# Patient Record
Sex: Male | Born: 1950 | Race: White | Hispanic: No | Marital: Married | Smoking: Former smoker
Health system: Southern US, Community
[De-identification: ages and names within clinical notes are randomized; demographics above are authoritative.]

## PROBLEM LIST (undated history)

## (undated) ENCOUNTER — Emergency Department (HOSPITAL_COMMUNITY): Admission: EM | Payer: No Typology Code available for payment source

## (undated) DIAGNOSIS — I2510 Atherosclerotic heart disease of native coronary artery without angina pectoris: Secondary | ICD-10-CM

## (undated) DIAGNOSIS — N200 Calculus of kidney: Secondary | ICD-10-CM

## (undated) DIAGNOSIS — Z930 Tracheostomy status: Secondary | ICD-10-CM

## (undated) DIAGNOSIS — E039 Hypothyroidism, unspecified: Secondary | ICD-10-CM

## (undated) DIAGNOSIS — I10 Essential (primary) hypertension: Secondary | ICD-10-CM

## (undated) DIAGNOSIS — E119 Type 2 diabetes mellitus without complications: Secondary | ICD-10-CM

## (undated) DIAGNOSIS — R339 Retention of urine, unspecified: Secondary | ICD-10-CM

## (undated) DIAGNOSIS — S069XAA Unspecified intracranial injury with loss of consciousness status unknown, initial encounter: Secondary | ICD-10-CM

## (undated) HISTORY — PX: CRANIECTOMY: SHX331

## (undated) HISTORY — PX: JEJUNOSTOMY FEEDING TUBE: SUR737

## (undated) HISTORY — PX: CHOLECYSTECTOMY: SHX55

## (undated) HISTORY — PX: OTHER SURGICAL HISTORY: SHX169

---

## 2007-09-21 ENCOUNTER — Ambulatory Visit: Payer: Self-pay

## 2007-10-04 ENCOUNTER — Ambulatory Visit: Payer: Self-pay | Admitting: Orthopaedic Surgery

## 2011-08-13 ENCOUNTER — Emergency Department: Payer: Self-pay | Admitting: Emergency Medicine

## 2018-06-15 ENCOUNTER — Other Ambulatory Visit: Payer: Self-pay

## 2018-06-15 ENCOUNTER — Encounter: Payer: Self-pay | Admitting: Emergency Medicine

## 2018-06-15 ENCOUNTER — Emergency Department
Admission: EM | Admit: 2018-06-15 | Discharge: 2018-06-15 | Disposition: A | Discharge status: Home / Self Care | Payer: Non-veteran care | Attending: Emergency Medicine | Admitting: Emergency Medicine

## 2018-06-15 DIAGNOSIS — H11421 Conjunctival edema, right eye: Secondary | ICD-10-CM | POA: Diagnosis not present

## 2018-06-15 DIAGNOSIS — E119 Type 2 diabetes mellitus without complications: Secondary | ICD-10-CM | POA: Diagnosis not present

## 2018-06-15 DIAGNOSIS — H5711 Ocular pain, right eye: Secondary | ICD-10-CM | POA: Diagnosis present

## 2018-06-15 HISTORY — DX: Type 2 diabetes mellitus without complications: E11.9

## 2018-06-15 HISTORY — DX: Calculus of kidney: N20.0

## 2018-06-15 MED ORDER — KETOROLAC TROMETHAMINE 0.4 % OP SOLN: 1.0000 [drp] | Freq: Four times a day (QID) | OPHTHALMIC | 0 refills | Status: DC | PRN

## 2018-06-15 MED ORDER — TETRACAINE HCL 0.5 % OP SOLN
1.0000 [drp] | Freq: Once | OPHTHALMIC | Status: AC
  Administered 2018-06-15: 1 [drp] via OPHTHALMIC

## 2018-06-15 MED ORDER — FLUORESCEIN SODIUM 1 MG OP STRP
ORAL_STRIP | OPHTHALMIC | Status: AC
  Filled 2018-06-15: qty 1

## 2018-06-15 MED ORDER — TETRACAINE HCL 0.5 % OP SOLN
OPHTHALMIC | Status: AC
  Filled 2018-06-15: qty 4

## 2018-06-15 MED ORDER — FLUORESCEIN SODIUM 1 MG OP STRP
1.0000 | ORAL_STRIP | Freq: Once | OPHTHALMIC | Status: AC
  Administered 2018-06-15: 1 via OPHTHALMIC

## 2018-06-15 NOTE — ED Notes (Signed)
MD at bedside. 

## 2018-06-15 NOTE — Discharge Instructions (Signed)
Please make sure your eyes remain well lubricated and follow-up with your ophthalmologist as scheduled.  Return to the emergency department for any concerns.  It was a pleasure to take care of you today, and thank you for coming to our emergency department.  If you have any questions or concerns before leaving please ask the nurse to grab me and I'm more than happy to go through your aftercare instructions again.  If you were prescribed any opioid pain medication today such as Norco, Vicodin, Percocet, morphine, hydrocodone, or oxycodone please make sure you do not drive when you are taking this medication as it can alter your ability to drive safely.  If you have any concerns once you are home that you are not improving or are in fact getting worse before you can make it to your follow-up appointment, please do not hesitate to call 911 and come back for further evaluation.  Merrily BrittleNeil Afton Mikelson, MD

## 2018-06-15 NOTE — ED Triage Notes (Addendum)
Patient ambulatory to triage with steady gait, without difficulty or distress noted; pt reports approx 1pm had proced at [facility] for [facility] to eye; hr PTA began having bleeding to eye and seems to have more pain than normally does; visual acuity 20/25 left and 20/50 right

## 2018-06-15 NOTE — ED Provider Notes (Signed)
[facility] Emergency Department Provider Note  ____________________________________________   First MD Initiated Contact with Patient 06/15/18 2113     (approximate)  I have reviewed the triage vital signs and the nursing notes.   HISTORY  Chief Complaint Eye Pain   HPI Joshua Haley is a 67 y.o. male self presents to the emergency department with gradual onset slowly progressive mild to moderate right eye pain and "bleeding from my right eye" that began about an hour ago.  He has a long-standing history of macular degeneration and earlier today received an unknown injection intraocularly at the [facility].  This is his fifth such injection.  He has never had pain or bleeding following.  His symptoms came on gradually were slowly progressive are now mild to moderate.  Nothing seems to make them better or worse.  He does take aspirin but no other blood thinning medications.    Past Medical History:  Diagnosis Date  . Diabetes mellitus without complication (HCC)   . Kidney stone     There are no active problems to display for this patient.     Prior to Admission medications   Medication Sig Start Date End Date Taking? Authorizing Provider  ketorolac (ACULAR) 0.4 % SOLN Place 1 drop into the right eye 4 (four) times daily as needed (pain). 06/15/18   Merrily Brittleifenbark, Nyima Vanacker, MD    Allergies Morphine and related  No family history on file.  Social History Social History   Tobacco Use  . Smoking status: Never Smoker  . Smokeless tobacco: Never Used  Substance Use Topics  . Alcohol use: Not on file  . Drug use: Not on file    Review of Systems Constitutional: No fever/chills Eyes: Positive for visual changes Cardiovascular: Denies chest pain. Respiratory: Denies shortness of breath. Gastrointestinal: No abdominal pain.  No nausea, no vomiting.   Musculoskeletal: Negative for back pain. Skin: Negative for rash. Neurological:  Negative for headaches, focal weakness or numbness.   ____________________________________________   PHYSICAL EXAM:  VITAL SIGNS: ED Triage Vitals  Enc Vitals Group     BP 06/15/18 2104 (!) 165/84     Pulse Rate 06/15/18 2104 67     Resp 06/15/18 2104 20     Temp 06/15/18 2104 97.9 F (36.6 C)     Temp Source 06/15/18 2104 Oral     SpO2 06/15/18 2104 97 %     Weight 06/15/18 2105 220 lb (99.8 kg)     Height 06/15/18 2105 5\' 11"  (1.803 m)     Head Circumference --      Peak Flow --      Pain Score 06/15/18 2104 5     Pain Loc --      Pain Edu? --      Excl. in GC? --     Constitutional: Alert and oriented x4 pleasant cooperative speaks full clear sentences no diaphoresis Eyes: PERRL EOMI. midrange and brisk 20/20 bilaterally Intraocular pressures 15 on the right 12 on the left Right eye with bloody chemosis No fluorescein uptake Head: Atraumatic. Mouth/Throat: No trismus Neck: No stridor.   Cardiovascular: Normal rate, regular rhythm. Grossly normal heart sounds.  Good peripheral circulation. Respiratory: Normal respiratory effort.  No retractions. Lungs CTAB and moving good air Neurologic:  Normal speech and language. No gross focal neurologic deficits are appreciated. Skin:  Skin is warm, dry and intact. No rash noted. Psychiatric: Mood and affect are normal. Speech and behavior are normal.  ____________________________________________   DIFFERENTIAL includes but not limited to  Glaucoma, low intraocular pressure, corneal abrasion, corneal ulcer ____________________________________________   LABS (all labs ordered are listed, but only abnormal results are displayed)  Labs Reviewed - No data to display   __________________________________________  EKG   ____________________________________________  RADIOLOGY   ____________________________________________   PROCEDURES  Procedure(s) performed: no  Procedures  Critical Care performed:  no  Observation: no ____________________________________________   INITIAL IMPRESSION / ASSESSMENT AND PLAN / ED COURSE  Pertinent labs & imaging results that were available during my care of the patient were reviewed by me and considered in my medical decision making (see chart for details).  Spoke with on-call ophthalmologist Dr. Sharman Crate who indicated the pain is likely either secondary to corneal abrasion or from the Betadine in the eye from prior to surgery.  The patient's intraocular pressure is normal his visual acuity is normal and he has no forcing uptake.  His symptoms are nearly completely resolved following tetracaine.  I will prescribe him ketorolac eyedrops and instructions to have his eyes remain well lubricated.  He is discharged home in stable condition.      ____________________________________________   FINAL CLINICAL IMPRESSION(S) / ED DIAGNOSES  Final diagnoses:  Chemosis of right conjunctiva      NEW MEDICATIONS STARTED DURING THIS VISIT:  Discharge Medication List as of 06/15/2018  9:49 PM    START taking these medications   Details  ketorolac (ACULAR) 0.4 % SOLN Place 1 drop into the right eye 4 (four) times daily as needed (pain)., Starting Wed 06/15/2018, Print         Note:  This document was prepared using Dragon voice recognition software and may include unintentional dictation errors.     Merrily Brittle, MD 06/17/18 [phone]

## 2019-01-30 ENCOUNTER — Emergency Department
Admission: EM | Admit: 2019-01-30 | Discharge: 2019-01-30 | Disposition: A | Discharge status: Home / Self Care | Payer: Non-veteran care | Attending: Emergency Medicine | Admitting: Emergency Medicine

## 2019-01-30 ENCOUNTER — Encounter: Payer: Self-pay | Admitting: Emergency Medicine

## 2019-01-30 DIAGNOSIS — K0889 Other specified disorders of teeth and supporting structures: Secondary | ICD-10-CM | POA: Diagnosis present

## 2019-01-30 DIAGNOSIS — M26622 Arthralgia of left temporomandibular joint: Secondary | ICD-10-CM | POA: Insufficient documentation

## 2019-01-30 DIAGNOSIS — E119 Type 2 diabetes mellitus without complications: Secondary | ICD-10-CM | POA: Insufficient documentation

## 2019-01-30 MED ORDER — ETODOLAC 500 MG PO TABS: 500.0000 mg | ORAL_TABLET | Freq: Two times a day (BID) | ORAL | 0 refills | Status: AC

## 2019-01-30 MED ORDER — KETOROLAC TROMETHAMINE 30 MG/ML IJ SOLN
30.0000 mg | Freq: Once | INTRAMUSCULAR | Status: AC
  Administered 2019-01-30: 30 mg via INTRAMUSCULAR
  Filled 2019-01-30: qty 1

## 2019-01-30 NOTE — ED Provider Notes (Signed)
[facility] EMERGENCY DEPARTMENT Provider Note   CSN: [id number] Arrival date & time: 01/30/19  2224     History   Chief Complaint Chief Complaint  Patient presents with  . Dental Pain    HPI Joshua Haley is a 68 y.o. male.  Presents to the emergency department for evaluation of left jaw pain x2 weeks.  He denies any trauma or injury.  He points to the left TMJ and describes severe aching pain with chewing and laying on the left side of his face.  Pain radiates into the left ear.  He denies any burning numbness or tingling.  He denies any trauma or injury.  He seen the dentist, had x-rays taken showing no infections.  He has not been taking any medicines for his pain.  He does admit to grinding his teeth.  He denies any weakness, rashes, vision changes headaches, nausea, vomiting, diaphoresis, chest pain or shortness of breath.  HPI  Past Medical History:  Diagnosis Date  . Diabetes mellitus without complication (HCC)   . Kidney stone     There are no active problems to display for this patient.   Past Surgical History:  Procedure Laterality Date  . CHOLECYSTECTOMY          Home Medications    Prior to Admission medications   Medication Sig Start Date End Date Taking? Authorizing Provider  etodolac (LODINE) 500 MG tablet Take 1 tablet (500 mg total) by mouth 2 (two) times daily for 10 days. 01/30/19 02/09/19  Evon SlackGaines, Bette Brienza C, PA-C  ketorolac (ACULAR) 0.4 % SOLN Place 1 drop into the right eye 4 (four) times daily as needed (pain). 06/15/18   Merrily Brittleifenbark, Neil, MD    Family History History reviewed. No pertinent family history.  Social History Social History   Tobacco Use  . Smoking status: Never Smoker  . Smokeless tobacco: Never Used  Substance Use Topics  . Alcohol use: Not on file  . Drug use: Not on file     Allergies   Morphine and related   Review of Systems Review of Systems  Constitutional: Negative for fever.  HENT:  Negative for sore throat, tinnitus and trouble swallowing.   Eyes: Negative for photophobia.  Respiratory: Negative for shortness of breath.   Cardiovascular: Negative for chest pain.  Musculoskeletal: Positive for arthralgias.  Skin: Negative for rash and wound.  Neurological: Negative for dizziness, light-headedness and headaches.     Physical Exam Updated Vital Signs BP (!) 154/81 (BP Location: Right Arm)   Pulse 69   Temp 98.2 F (36.8 C) (Oral)   Resp 18   SpO2 98%   Physical Exam Vitals signs reviewed.  Constitutional:      Appearance: He is well-developed.  HENT:     Head: Normocephalic and atraumatic.     Comments: No rashes, left TMJ point tender to palpation with significant pain with palpation.  No right TMJ tenderness.  No crepitus with TMJ range of motion.  No facial swelling.  No intraoral lesions, swelling, fluctuance or redness.  No mastoid tenderness.    Right Ear: Tympanic membrane, ear canal and external ear normal.     Left Ear: Tympanic membrane, ear canal and external ear normal.     Nose: Nose normal.  Eyes:     Conjunctiva/sclera: Conjunctivae normal.  Neck:     Musculoskeletal: Normal range of motion.  Cardiovascular:     Rate and Rhythm: Normal rate.  Pulmonary:  Effort: Pulmonary effort is normal. No respiratory distress.  Musculoskeletal: Normal range of motion.  Skin:    General: Skin is warm.     Findings: No rash.  Neurological:     Mental Status: He is alert and oriented to person, place, and time.  Psychiatric:        Behavior: Behavior normal.        Thought Content: Thought content normal.      ED Treatments / Results  Labs (all labs ordered are listed, but only abnormal results are displayed) Labs Reviewed - No data to display  EKG None  Radiology No results found.  Procedures Procedures (including critical care time)  Medications Ordered in ED Medications  ketorolac (TORADOL) 30 MG/ML injection 30 mg (has no  administration in time range)     Initial Impression / Assessment and Plan / ED Course  I have reviewed the triage vital signs and the nursing notes.  Pertinent labs & imaging results that were available during my care of the patient were reviewed by me and considered in my medical decision making (see chart for details).    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  He will eat a soft diet.  He will start etodolac.  He will follow-up with PCP if no improvement in 3 to 4 days.  He understands signs symptoms return to the ED for. Final Clinical Impressions(s) / ED Diagnoses   Final diagnoses:  Arthralgia of left temporomandibular joint    ED Discharge Orders         Ordered    etodolac (LODINE) 500 MG tablet  2 times daily     01/30/19 2311           Ronnette Juniper 01/30/19 2315    Phineas Semen, MD 01/31/19 [phone]

## 2019-01-30 NOTE — Discharge Instructions (Addendum)
Please avoid eating hard chewy foods.  Eat foods such as potatoes, rice, Jell-O, bananas.  Take etodolac as prescribed twice daily with food.  If no improvement in 3 to 4 days follow-up with primary care provider.  Return to the ER for any worsening symptoms or urgent changes in your health.

## 2019-01-30 NOTE — ED Triage Notes (Signed)
Pt c/o upper dental pain and jaw pain x2 weeks ago with worsening pain today. Pt had dental work on lower same side x1 week ago and since the pain has worsened. Pt denies fever nor drainage.

## 2020-01-19 ENCOUNTER — Emergency Department: Payer: No Typology Code available for payment source

## 2020-01-19 ENCOUNTER — Other Ambulatory Visit: Payer: Self-pay

## 2020-01-19 ENCOUNTER — Inpatient Hospital Stay
Admission: EM | Admit: 2020-01-19 | Discharge: 2020-01-22 | DRG: 287 | Disposition: A | Discharge status: Other Acute Inpatient Hospital | Payer: No Typology Code available for payment source | Attending: Hospitalist | Admitting: Hospitalist

## 2020-01-19 DIAGNOSIS — I25110 Atherosclerotic heart disease of native coronary artery with unstable angina pectoris: Secondary | ICD-10-CM | POA: Diagnosis not present

## 2020-01-19 DIAGNOSIS — G4733 Obstructive sleep apnea (adult) (pediatric): Secondary | ICD-10-CM | POA: Diagnosis present

## 2020-01-19 DIAGNOSIS — Z87442 Personal history of urinary calculi: Secondary | ICD-10-CM

## 2020-01-19 DIAGNOSIS — Z79890 Hormone replacement therapy (postmenopausal): Secondary | ICD-10-CM

## 2020-01-19 DIAGNOSIS — D509 Iron deficiency anemia, unspecified: Secondary | ICD-10-CM | POA: Diagnosis present

## 2020-01-19 DIAGNOSIS — E1151 Type 2 diabetes mellitus with diabetic peripheral angiopathy without gangrene: Secondary | ICD-10-CM | POA: Diagnosis present

## 2020-01-19 DIAGNOSIS — Z885 Allergy status to narcotic agent status: Secondary | ICD-10-CM

## 2020-01-19 DIAGNOSIS — E119 Type 2 diabetes mellitus without complications: Secondary | ICD-10-CM

## 2020-01-19 DIAGNOSIS — Z7984 Long term (current) use of oral hypoglycemic drugs: Secondary | ICD-10-CM

## 2020-01-19 DIAGNOSIS — R778 Other specified abnormalities of plasma proteins: Secondary | ICD-10-CM

## 2020-01-19 DIAGNOSIS — I10 Essential (primary) hypertension: Secondary | ICD-10-CM

## 2020-01-19 DIAGNOSIS — Z20822 Contact with and (suspected) exposure to covid-19: Secondary | ICD-10-CM | POA: Diagnosis present

## 2020-01-19 DIAGNOSIS — R079 Chest pain, unspecified: Secondary | ICD-10-CM

## 2020-01-19 DIAGNOSIS — Z79899 Other long term (current) drug therapy: Secondary | ICD-10-CM

## 2020-01-19 DIAGNOSIS — E039 Hypothyroidism, unspecified: Secondary | ICD-10-CM

## 2020-01-19 DIAGNOSIS — K219 Gastro-esophageal reflux disease without esophagitis: Secondary | ICD-10-CM

## 2020-01-19 DIAGNOSIS — Z9049 Acquired absence of other specified parts of digestive tract: Secondary | ICD-10-CM

## 2020-01-19 DIAGNOSIS — N200 Calculus of kidney: Secondary | ICD-10-CM

## 2020-01-19 DIAGNOSIS — Z87891 Personal history of nicotine dependence: Secondary | ICD-10-CM

## 2020-01-19 LAB — CBC
HCT: 37.7 % — ABNORMAL LOW (ref 39.0–52.0)
Hemoglobin: 11.9 g/dL — ABNORMAL LOW (ref 13.0–17.0)
MCH: 25.2 pg — ABNORMAL LOW (ref 26.0–34.0)
MCHC: 31.6 g/dL (ref 30.0–36.0)
MCV: 79.9 fL — ABNORMAL LOW (ref 80.0–100.0)
Platelets: 256 10*3/uL (ref 150–400)
RBC: 4.72 MIL/uL (ref 4.22–5.81)
RDW: 14.3 % (ref 11.5–15.5)
WBC: 6.6 10*3/uL (ref 4.0–10.5)
nRBC: 0 % (ref 0.0–0.2)

## 2020-01-19 LAB — TROPONIN I (HIGH SENSITIVITY)
Troponin I (High Sensitivity): 84 ng/L — ABNORMAL HIGH (ref <18)
Troponin I (High Sensitivity): 86 ng/L — ABNORMAL HIGH (ref <18)

## 2020-01-19 LAB — BASIC METABOLIC PANEL
Anion gap: 9 (ref 5–15)
BUN: 19 mg/dL (ref 8–23)
CO2: 26 mmol/L (ref 22–32)
Calcium: 9.2 mg/dL (ref 8.9–10.3)
Chloride: 102 mmol/L (ref 98–111)
Creatinine, Ser: 1.1 mg/dL (ref 0.61–1.24)
GFR calc Af Amer: >60 mL/min (ref >60)
GFR calc non Af Amer: >60 mL/min (ref >60)
Glucose, Bld: 312 mg/dL — ABNORMAL HIGH (ref 70–99)
Potassium: 4.3 mmol/L (ref 3.5–5.1)
Sodium: 137 mmol/L (ref 135–145)

## 2020-01-19 MED ORDER — ASPIRIN 81 MG PO CHEW
324.0000 mg | CHEWABLE_TABLET | Freq: Once | ORAL | Status: AC
  Administered 2020-01-19: 324 mg via ORAL
  Filled 2020-01-19: qty 4

## 2020-01-19 NOTE — ED Notes (Signed)
Pt verbalizes understanding of troponin results as discussed with MD and verbalizes understanding of plan to transfer to [place]. Pt provided signature for transfer consent and paperwork returned to Diplomatic Services operational officer.

## 2020-01-19 NOTE — ED Triage Notes (Signed)
Patient c/o medial chest pressure radiating to left arm and jaw X 1 week with increasing severity today. Patient denies accompanying symptoms. Patient has cardiac monitor over left chest in place for over 1 week.

## 2020-01-19 NOTE — ED Provider Notes (Addendum)
[facility] Emergency Department Provider Note  ____________________________________________   I have reviewed the triage vital signs and the nursing notes.   HISTORY  Chief Complaint Chest Pain   History limited by: Not Limited   HPI Joshua Haley is a 69 y.o. male who presents to the emergency department today because of concerns for chest pain.  It is located in the center and left chest.  It does radiate to his back and neck.  He states he has had multiple episodes of this pain over the past week.  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  No significant associated shortness of breath or diaphoresis.  He has seen his doctors at the [place] for this and states he has had negative work-up including negative enzymes.  Patient does have a history of diabetes and states it does refer his blood sugars to be in the 300s.  Records reviewed. Per medical record review patient has a history of diebete mellitus  Past Medical History:  Diagnosis Date  . Diabetes mellitus without complication (HCC)   . Kidney stone     There are no problems to display for this patient.   Past Surgical History:  Procedure Laterality Date  . CHOLECYSTECTOMY      Prior to Admission medications   Medication Sig Start Date End Date Taking? Authorizing Provider  ketorolac (ACULAR) 0.4 % SOLN Place 1 drop into the right eye 4 (four) times daily as needed (pain). 06/15/18   Merrily Brittle, MD    Allergies Morphine and related  No family history on file.  Social History Social History   Tobacco Use  . Smoking status: Former Games developer  . Smokeless tobacco: Never Used  Substance Use Topics  . Alcohol use: Not Currently  . Drug use: Not on file    Review of Systems Constitutional: No fever/chills Eyes: No visual changes. ENT: No sore throat. Cardiovascular: Positive for chest pain. Respiratory: Denies shortness of  breath. Gastrointestinal: No abdominal pain.  No nausea, no vomiting.  No diarrhea.   Genitourinary: Negative for dysuria. Musculoskeletal: Negative for back pain. Skin: Negative for rash. Neurological: Negative for headaches, focal weakness or numbness.  ____________________________________________   PHYSICAL EXAM:  VITAL SIGNS: ED Triage Vitals  Enc Vitals Group     BP 01/19/20 1931 139/67     Pulse Rate 01/19/20 1931 73     Resp 01/19/20 1931 18     Temp 01/19/20 1931 98.2 F (36.8 C)     Temp Source 01/19/20 1931 Oral     SpO2 01/19/20 1931 98 %     Weight 01/19/20 1932 225 lb (102.1 kg)     Height 01/19/20 1932 6' (1.829 m)     Head Circumference --      Peak Flow --      Pain Score 01/19/20 1932 4   Constitutional: Alert and oriented.  Eyes: Conjunctivae are normal.  ENT      Head: Normocephalic and atraumatic.      Nose: No congestion/rhinnorhea.      Mouth/Throat: Mucous membranes are moist.      Neck: No stridor. Hematological/Lymphatic/Immunilogical: No cervical lymphadenopathy. Cardiovascular: Normal rate, regular rhythm.  No murmurs, rubs, or gallops.  Respiratory: Normal respiratory effort without tachypnea nor retractions. Breath sounds are clear and equal bilaterally. No wheezes/rales/rhonchi. Gastrointestinal: Soft and non tender. No rebound. No guarding.  Genitourinary: Deferred Musculoskeletal: Normal range of motion in  all extremities. No lower extremity edema. Neurologic:  Normal speech and language. No gross focal neurologic deficits are appreciated.  Skin:  Skin is warm, dry and intact. No rash noted. Psychiatric: Mood and affect are normal. Speech and behavior are normal. Patient exhibits appropriate insight and judgment.  ____________________________________________    LABS (pertinent positives/negatives)  Trop hs 84 -> 86 BMP wnl except glu 312 CBC wbc 6.6, hgb 11.9, plt 256  ____________________________________________   EKG  I,  Nance Pear, attending physician, personally viewed and interpreted this EKG  EKG Time: 1926 Rate: 71 Rhythm: normal sinus rhythm Axis: normal Intervals: qtc 412 QRS: narrow, q waves avr, v1 ST changes: no st elevation Impression: abnormal ekg   ____________________________________________    RADIOLOGY  CXR No acute abnormality  ____________________________________________   PROCEDURES  Procedures  ____________________________________________   INITIAL IMPRESSION / ASSESSMENT AND PLAN / ED COURSE  Pertinent labs & imaging results that were available during my care of the patient were reviewed by me and considered in my medical decision making (see chart for details).   Patient presented to the emergency department today because of concerns for chest pain.  He does describe it being exertional in nature.  He did have a chest pain work-up started in triage.  This was concerning for elevated troponin.  Per patient's report this would be a change from his recent negative troponins at the [place].  Patient does have risk factor for heart disease.  At this time I do think patient would benefit from inpatient mission for further cardiac work-up.  The time my exam while patient is lying in the bed he states he is pain-free.  Did send off paperwork to initiate transfer to [place].   [facility]iscussed this with the patient. Will plan on admission here. ___________________________________________   FINAL CLINICAL IMPRESSION(S) / ED DIAGNOSES  Final diagnoses:  Chest pain, unspecified type  Elevated troponin     Note: This dictation was prepared with Dragon dictation. Any transcriptional errors that result from this process are unintentional     Nance Pear, MD 01/19/20 2329    Nance Pear, MD 01/19/20 [phone]

## 2020-01-20 ENCOUNTER — Other Ambulatory Visit: Payer: Self-pay

## 2020-01-20 DIAGNOSIS — R079 Chest pain, unspecified: Secondary | ICD-10-CM

## 2020-01-20 DIAGNOSIS — E119 Type 2 diabetes mellitus without complications: Secondary | ICD-10-CM

## 2020-01-20 DIAGNOSIS — E039 Hypothyroidism, unspecified: Secondary | ICD-10-CM

## 2020-01-20 DIAGNOSIS — I10 Essential (primary) hypertension: Secondary | ICD-10-CM

## 2020-01-20 DIAGNOSIS — N200 Calculus of kidney: Secondary | ICD-10-CM

## 2020-01-20 DIAGNOSIS — K219 Gastro-esophageal reflux disease without esophagitis: Secondary | ICD-10-CM

## 2020-01-20 LAB — HEMOGLOBIN A1C
Hgb A1c MFr Bld: 7.9 % — ABNORMAL HIGH (ref 4.8–5.6)
Mean Plasma Glucose: 180.03 mg/dL

## 2020-01-20 LAB — FERRITIN: Ferritin: 8 ng/mL — ABNORMAL LOW (ref 24–336)

## 2020-01-20 LAB — CBC
HCT: 36.4 % — ABNORMAL LOW (ref 39.0–52.0)
Hemoglobin: 11.5 g/dL — ABNORMAL LOW (ref 13.0–17.0)
MCH: 25.1 pg — ABNORMAL LOW (ref 26.0–34.0)
MCHC: 31.6 g/dL (ref 30.0–36.0)
MCV: 79.5 fL — ABNORMAL LOW (ref 80.0–100.0)
Platelets: 236 10*3/uL (ref 150–400)
RBC: 4.58 MIL/uL (ref 4.22–5.81)
RDW: 14.4 % (ref 11.5–15.5)
WBC: 5.9 10*3/uL (ref 4.0–10.5)
nRBC: 0 % (ref 0.0–0.2)

## 2020-01-20 LAB — GLUCOSE, CAPILLARY
Glucose-Capillary: 178 mg/dL — ABNORMAL HIGH (ref 70–99)
Glucose-Capillary: 140 mg/dL — ABNORMAL HIGH (ref 70–99)
Glucose-Capillary: 190 mg/dL — ABNORMAL HIGH (ref 70–99)
Glucose-Capillary: 171 mg/dL — ABNORMAL HIGH (ref 70–99)
Glucose-Capillary: 188 mg/dL — ABNORMAL HIGH (ref 70–99)

## 2020-01-20 LAB — SARS CORONAVIRUS 2 (TAT 6-24 HRS): SARS Coronavirus 2: NEGATIVE

## 2020-01-20 LAB — CREATININE, SERUM
Creatinine, Ser: 0.81 mg/dL (ref 0.61–1.24)
GFR calc Af Amer: >60 mL/min (ref >60)
GFR calc non Af Amer: >60 mL/min (ref >60)

## 2020-01-20 LAB — TROPONIN I (HIGH SENSITIVITY)
Troponin I (High Sensitivity): 92 ng/L — ABNORMAL HIGH (ref <18)
Troponin I (High Sensitivity): 89 ng/L — ABNORMAL HIGH (ref <18)

## 2020-01-20 LAB — HIV ANTIBODY (ROUTINE TESTING W REFLEX): HIV Screen 4th Generation wRfx: NONREACTIVE

## 2020-01-20 LAB — GLUCOSE, RANDOM: Glucose, Bld: 187 mg/dL — ABNORMAL HIGH (ref 70–99)

## 2020-01-20 LAB — IRON AND TIBC
Iron: 37 ug/dL — ABNORMAL LOW (ref 45–182)
Saturation Ratios: 8 % — ABNORMAL LOW (ref 17.9–39.5)
TIBC: 461 ug/dL — ABNORMAL HIGH (ref 250–450)
UIBC: 424 ug/dL

## 2020-01-20 MED ORDER — ENOXAPARIN SODIUM 40 MG/0.4ML ~~LOC~~ SOLN
40.0000 mg | SUBCUTANEOUS | Status: DC
  Administered 2020-01-20 – 2020-01-21 (×2): 40 mg via SUBCUTANEOUS
  Filled 2020-01-20 (×2): qty 0.4

## 2020-01-20 MED ORDER — NITROGLYCERIN 0.4 MG SL SUBL: 0.4000 mg | SUBLINGUAL_TABLET | SUBLINGUAL | Status: DC | PRN

## 2020-01-20 MED ORDER — ONDANSETRON HCL 4 MG/2ML IJ SOLN: 4.0000 mg | Freq: Four times a day (QID) | INTRAMUSCULAR | Status: DC | PRN

## 2020-01-20 MED ORDER — LISINOPRIL 20 MG PO TABS
20.0000 mg | ORAL_TABLET | Freq: Every day | ORAL | Status: DC
  Administered 2020-01-20 – 2020-01-22 (×3): 20 mg via ORAL
  Filled 2020-01-20 (×3): qty 1

## 2020-01-20 MED ORDER — LEVOTHYROXINE SODIUM 137 MCG PO TABS
137.0000 ug | ORAL_TABLET | Freq: Every day | ORAL | Status: DC
  Administered 2020-01-20 – 2020-01-22 (×3): 137 ug via ORAL
  Filled 2020-01-20 (×4): qty 1

## 2020-01-20 MED ORDER — INSULIN GLARGINE 100 UNIT/ML ~~LOC~~ SOLN
10.0000 [IU] | Freq: Every day | SUBCUTANEOUS | Status: DC
  Administered 2020-01-20 – 2020-01-21 (×2): 10 [IU] via SUBCUTANEOUS
  Filled 2020-01-20 (×4): qty 0.1

## 2020-01-20 MED ORDER — METOPROLOL SUCCINATE ER 50 MG PO TB24
50.0000 mg | ORAL_TABLET | Freq: Every day | ORAL | Status: DC
  Administered 2020-01-20 – 2020-01-22 (×3): 50 mg via ORAL
  Filled 2020-01-20 (×3): qty 1

## 2020-01-20 MED ORDER — ACETAMINOPHEN 325 MG PO TABS: 650.0000 mg | ORAL_TABLET | ORAL | Status: DC | PRN

## 2020-01-20 MED ORDER — NITROGLYCERIN 0.4 MG SL SUBL
0.4000 mg | SUBLINGUAL_TABLET | Freq: Once | SUBLINGUAL | Status: AC
  Administered 2020-01-20: 0.4 mg via SUBLINGUAL
  Filled 2020-01-20: qty 1

## 2020-01-20 MED ORDER — PANTOPRAZOLE SODIUM 40 MG PO TBEC
40.0000 mg | DELAYED_RELEASE_TABLET | Freq: Every day | ORAL | Status: DC
  Administered 2020-01-20 – 2020-01-21 (×2): 40 mg via ORAL
  Filled 2020-01-20 (×4): qty 1

## 2020-01-20 MED ORDER — INSULIN ASPART 100 UNIT/ML ~~LOC~~ SOLN
0.0000 [IU] | Freq: Three times a day (TID) | SUBCUTANEOUS | Status: DC
  Administered 2020-01-20: 08:00:00 2 [IU] via SUBCUTANEOUS
  Administered 2020-01-20 (×2): 3 [IU] via SUBCUTANEOUS
  Administered 2020-01-21 (×2): 5 [IU] via SUBCUTANEOUS
  Administered 2020-01-21 – 2020-01-22 (×2): 3 [IU] via SUBCUTANEOUS
  Administered 2020-01-22: 11 [IU] via SUBCUTANEOUS
  Filled 2020-01-20 (×8): qty 1

## 2020-01-20 MED ORDER — SODIUM CHLORIDE 0.9% FLUSH
3.0000 mL | Freq: Two times a day (BID) | INTRAVENOUS | Status: DC
  Administered 2020-01-20 – 2020-01-21 (×2): 3 mL via INTRAVENOUS

## 2020-01-20 NOTE — Plan of Care (Signed)
  Problem: Clinical Measurements: Goal: Diagnostic test results will improve Outcome: Progressing Goal: Respiratory complications will improve Outcome: Progressing   Problem: Activity: Goal: Risk for activity intolerance will decrease Outcome: Progressing   Problem: Safety: Goal: Ability to remain free from injury will improve Outcome: Progressing   

## 2020-01-20 NOTE — H&P (Signed)
History and Physical    Joshua Haley GGY:[id number] DOB: [birth year] DOA: 01/19/2020  PCP: [facility]  Patient coming from: home   Chief Complaint: chest pain  HPI: Joshua Haley is a 69 y.o. male with medical history significant for dm, nephrolithiassis, htn, hypothyroidism, gerd, and osa, who presents with above.  Symptoms began about 1.5 weeks ago. Has developed substernal chest pain that radiates up neck. Typically brought on by extertion but also coming on sometimes at rest. Pain not severe but quite bothersome. Once or twice associated with mild dyspnea but otherwise no dyspnea or cough. Doesn't radiate to back. Denies personal or family history of heart problems. Says had a negative exercise stress test 2 years ago at the [place].  [facility] and [facility] to the [place] ED this week for this problem. Says PCP ordered ziopatch which he is wearing. Says ED evaluation both times negative for ischemia, second time was told this likely gerd.  ED Course: aspirin, labs  Review of Systems: As per HPI otherwise 10 point review of systems negative.    Past Medical History:  Diagnosis Date  . Diabetes mellitus without complication (HCC)   . Kidney stone     Past Surgical History:  Procedure Laterality Date  . CHOLECYSTECTOMY       reports that he has quit smoking. He has never used smokeless tobacco. He reports previous alcohol use. No history on file for drug.  Allergies  Allergen Reactions  . Morphine And Related     No family history on file.  Prior to Admission medications   Medication Sig Start Date End Date Taking? Authorizing Provider  glyBURIDE (DIABETA) 5 MG tablet Take 5 mg by mouth 2 (two) times daily with a meal.   Yes [provider]  levothyroxine (SYNTHROID) 137 MCG tablet Take 137 mcg by mouth daily before breakfast.   Yes [provider]  lisinopril (ZESTRIL) 20 MG tablet Take 20 mg by mouth daily.   Yes [provider]  metFORMIN (GLUCOPHAGE) 1000 MG tablet Take 1,000 mg by mouth 2 (two) times daily with a meal.   Yes [provider]  metoprolol succinate (TOPROL-XL) 50 MG 24 hr tablet Take 50 mg by mouth daily. Take with or immediately following a meal.   Yes [provider]  pantoprazole (PROTONIX) 40 MG tablet Take 40 mg by mouth daily.   Yes [provider]  ketorolac (ACULAR) 0.4 % SOLN Place 1 drop into the right eye 4 (four) times daily as needed (pain). 06/15/18   Merrily Brittle, MD    Physical Exam: Vitals:   01/19/20 2156 01/19/20 2157 01/19/20 2200 01/19/20 2230  BP:  (!) 146/69 134/72 (!) 151/86  Pulse:   67 67  Resp: 16  14   Temp:      TempSrc:      SpO2:   94% 95%  Weight:      Height:        Constitutional: No acute distress Head: Atraumatic Eyes: Conjunctiva clear ENM: Moist mucous membranes. Normal dentition.  Neck: Supple Respiratory: Clear to auscultation bilaterally, no wheezing/rales/rhonchi. Normal respiratory effort. No accessory muscle use. . Cardiovascular: Regular rate and rhythm. No murmurs/rubs/gallops. ziopatch left upper chest Abdomen: Non-tender, non-distended. No masses. No rebound or guarding. Positive bowel sounds. Musculoskeletal: No joint deformity upper and lower extremities. Normal ROM, no contractures. Normal muscle tone.  Skin: No rashes, lesions, or ulcers.  Extremities: No peripheral edema.  Palpable peripheral pulses. Neurologic: Alert, moving all 4 extremities. Psychiatric: Normal insight and judgement.   Labs on Admission: I have personally reviewed following labs and imaging studies  CBC: Recent Labs  Lab 01/19/20 1938  WBC 6.6  HGB 11.9*  HCT 37.7*  MCV 79.9*  PLT 256   Basic Metabolic Panel: Recent Labs  Lab 01/19/20 1938  NA 137  K 4.3  CL 102  CO2 26  GLUCOSE 312*  BUN 19  CREATININE 1.10  CALCIUM 9.2   GFR: Estimated Creatinine Clearance: 79.5 mL/min (by C-G formula based on SCr  of 1.1 mg/dL). Liver Function Tests: No results for input(s): AST, ALT, ALKPHOS, BILITOT, PROT, ALBUMIN in the last 168 hours. No results for input(s): LIPASE, AMYLASE in the last 168 hours. No results for input(s): AMMONIA in the last 168 hours. Coagulation Profile: No results for input(s): INR, PROTIME in the last 168 hours. Cardiac Enzymes: No results for input(s): CKTOTAL, CKMB, CKMBINDEX, TROPONINI in the last 168 hours. BNP (last 3 results) No results for input(s): PROBNP in the last 8760 hours. HbA1C: No results for input(s): HGBA1C in the last 72 hours. CBG: No results for input(s): GLUCAP in the last 168 hours. Lipid Profile: No results for input(s): CHOL, HDL, LDLCALC, TRIG, CHOLHDL, LDLDIRECT in the last 72 hours. Thyroid Function Tests: No results for input(s): TSH, T4TOTAL, FREET4, T3FREE, THYROIDAB in the last 72 hours. Anemia Panel: No results for input(s): VITAMINB12, FOLATE, FERRITIN, TIBC, IRON, RETICCTPCT in the last 72 hours. Urine analysis: No results found for: COLORURINE, APPEARANCEUR, LABSPEC, PHURINE, GLUCOSEU, HGBUR, BILIRUBINUR, KETONESUR, PROTEINUR, UROBILINOGEN, NITRITE, LEUKOCYTESUR  Radiological Exams on Admission: DG Chest 2 View  Result Date: 01/19/2020 CLINICAL DATA:  Medial chest pressure radiating to LEFT arm and jaw for 1 week, increasing in severity today, history diabetes mellitus, former smoker EXAM: CHEST - 2 VIEW COMPARISON:  None FINDINGS: Cardiac monitoring device projects over anterior LEFT chest. Upper normal heart size. Mediastinal contours and pulmonary vascularity normal. Lungs clear. No pulmonary infiltrate, pleural effusion, or pneumothorax. Bones unremarkable. IMPRESSION: No acute abnormalities. Electronically Signed   By: Ulyses Southward M.D.   On: 01/19/2020 19:54    EKG: Independently reviewed. Normal axis, no ischemic changes  Assessment/Plan Principal Problem:   Chest pain Active Problems:   T2DM (type 2 diabetes mellitus)  (HCC)   Nephrolithiasis   Hypothyroidism   GERD (gastroesophageal reflux disease)   HTN (hypertension)   # Chest pain - anginal in nature, several risk factors present, hs troponin mildly elevated to 84 and at 86 on repeat. EKG does not appear ischemic. Has received aspirin. Says does not tolerate statins. Says negative exercise stress test at the [place] 1-2 years ago, denies personal history CAD. [facility] - admit - continue to trend troponins - nitroglycerin prn - telemetry, AM EKG - AM cardiology consult - plan to start heparin if troponins up-trend  # T2DM - here glucose elevated to 312 - hold home orals - start lantus 10 qd, start SSI with meals  # HTN - here bp moderately elevated - cont home lisinopril and metop; CTM  # microcytic anemia - mild, h 11.9 - f/u iron studies, fecal occult blood  # OSA - cont qhs cpap  # hypothyroid - cont home synthroid  # Gerd - cont home ppi  DVT prophylaxis: lovenox Code Status: full  Family Communication: wife  Disposition Plan: tbd  Consults called:  cardiology in AM  Admission status: tele    Desma Maxim MD [facility] Pager [phone]  If 7PM-7AM, please contact night-coverage [email or web address] Password [facility]  01/20/2020, 12:57 AM

## 2020-01-20 NOTE — Progress Notes (Signed)
Patient has home cpap in use. Unit is in good functioning order and patient is able to use independently.

## 2020-01-20 NOTE — Progress Notes (Signed)
Pt's latest troponin noted to be elevated from previous reading; value went from 86 to 92. On call provider notified, no new orders at this time, pt not experiencing any sx presently.

## 2020-01-20 NOTE — Consult Note (Addendum)
CARDIOLOGY CONSULT NOTE               Patient ID: Joshua Haley MRN: [id number] DOB/AGE: [birth year] 68 y.o.  Admit date: 01/19/2020 Referring Physician  Dr. Shonna Chock MD Primary Physician [facility] Primary Cardiologist none  Reason for Consultation unstable angina  HPI: 69 year old white male history of hypertension thyroid disease GERD obstructive sleep apnea previous smoking presents with significant chest pain and angina on and off over multiple weeks.  The patient had been evaluated by primary physician as well as urgent care patient was treated for GERD type symptoms.  Patient had persistent recurrent anginal symptoms so now presents for further assessment  Review of systems complete and found to be negative unless listed above     Past Medical History:  Diagnosis Date  . Diabetes mellitus without complication (HCC)   . Kidney stone     Past Surgical History:  Procedure Laterality Date  . CHOLECYSTECTOMY      Medications Prior to Admission  Medication Sig Dispense Refill Last Dose  . aspirin 81 MG chewable tablet Chew 81 mg by mouth daily.   01/19/2020 at 0430  . Cholecalciferol (VITAMIN D3) 125 MCG (5000 UT) CAPS Take 5,000 Units by mouth daily.   01/19/2020 at 0430  . Cyanocobalamin 1000 MCG/15ML LIQD Take 1,000 mcg by mouth daily.   01/19/2020 at 0430  . glyBURIDE (DIABETA) 5 MG tablet Take 5 mg by mouth 2 (two) times daily with a meal.   01/20/2020 at 0100  . ketoconazole (NIZORAL) 2 % cream Apply 1 application topically 2 (two) times daily.   prn at prn  . ketorolac (ACULAR) 0.4 % SOLN Place 1 drop into the right eye 4 (four) times daily as needed (pain). 1 Bottle 0 prn at prn  . levothyroxine (SYNTHROID) 137 MCG tablet Take 137 mcg by mouth daily before breakfast.   01/19/2020 at 0430  . lisinopril (ZESTRIL) 20 MG tablet Take 10 mg by mouth daily.    01/19/2020 at 0430  . meloxicam (MOBIC) 15 MG tablet Take 15 mg by mouth daily.   01/19/2020 at 0430  .  metFORMIN (GLUCOPHAGE) 1000 MG tablet Take 1,000 mg by mouth 2 (two) times daily with a meal.   01/20/2020 at 0100  . metoprolol succinate (TOPROL-XL) 50 MG 24 hr tablet Take 50 mg by mouth daily. Take with or immediately following a meal.   01/19/2020 at 0430  . pantoprazole (PROTONIX) 40 MG tablet Take 40 mg by mouth daily.   01/19/2020 at 0430   Social History   Socioeconomic History  . Marital status: Married    Spouse name: Not on file  . Number of children: Not on file  . Years of education: Not on file  . Highest education level: Not on file  Occupational History  . Not on file  Tobacco Use  . Smoking status: Former Games developer  . Smokeless tobacco: Never Used  Substance and Sexual Activity  . Alcohol use: Not Currently  . Drug use: Not on file  . Sexual activity: Not on file  Other Topics Concern  . Not on file  Social History Narrative  . Not on file   Social Determinants of Health   Financial Resource Strain:   . Difficulty of Paying Living Expenses: Not on file  Food Insecurity:   . Worried About Programme researcher, broadcasting/film/video in the Last Year: Not on file  . Ran Out of Food in the Last Year: Not on  file  Transportation Needs:   . Film/video editor (Medical): Not on file  . Lack of Transportation (Non-Medical): Not on file  Physical Activity:   . Days of Exercise per Week: Not on file  . Minutes of Exercise per Session: Not on file  Stress:   . Feeling of Stress : Not on file  Social Connections:   . Frequency of Communication with Friends and Family: Not on file  . Frequency of Social Gatherings with Friends and Family: Not on file  . Attends Religious Services: Not on file  . Active Member of Clubs or Organizations: Not on file  . Attends Archivist Meetings: Not on file  . Marital Status: Not on file  Intimate Partner Violence:   . Fear of Current or Ex-Partner: Not on file  . Emotionally Abused: Not on file  . Physically Abused: Not on file  . Sexually  Abused: Not on file    No family history on file.    Review of systems complete and found to be negative unless listed above      PHYSICAL EXAM  General: Well developed, well nourished, in no acute distress HEENT:  Normocephalic and atramatic Neck:  No JVD.  Lungs: Clear bilaterally to auscultation and percussion. Heart: HRRR . Normal S1 and S2 without gallops or murmurs.  Abdomen: Bowel sounds are positive, abdomen soft and non-tender  Msk:  Back normal, normal gait. Normal strength and tone for age. Extremities: No clubbing, cyanosis or edema.   Neuro: Alert and oriented X 3. Psych:  Good affect, responds appropriately  Labs:   Lab Results  Component Value Date   WBC 5.9 01/20/2020   HGB 11.5 (L) 01/20/2020   HCT 36.4 (L) 01/20/2020   MCV 79.5 (L) 01/20/2020   PLT 236 01/20/2020    Recent Labs  Lab 01/19/20 1938 01/19/20 1938 01/20/20 0341  NA 137  --   --   K 4.3  --   --   CL 102  --   --   CO2 26  --   --   BUN 19  --   --   CREATININE 1.10   < > 0.81  CALCIUM 9.2  --   --   GLUCOSE 312*   < > 187*   < > = values in this interval not displayed.   No results found for: CKTOTAL, CKMB, CKMBINDEX, TROPONINI No results found for: CHOL No results found for: HDL No results found for: LDLCALC No results found for: TRIG No results found for: CHOLHDL No results found for: LDLDIRECT    Radiology: DG Chest 2 View  Result Date: 01/19/2020 CLINICAL DATA:  Medial chest pressure radiating to LEFT arm and jaw for 1 week, increasing in severity today, history diabetes mellitus, former smoker EXAM: CHEST - 2 VIEW COMPARISON:  None FINDINGS: Cardiac monitoring device projects over anterior LEFT chest. Upper normal heart size. Mediastinal contours and pulmonary vascularity normal. Lungs clear. No pulmonary infiltrate, pleural effusion, or pneumothorax. Bones unremarkable. IMPRESSION: No acute abnormalities. Electronically Signed   By: Lavonia Dana M.D.   On: 01/19/2020 19:54     EKG: Normal sinus rhythm nonspecific ST-T wave changes  ASSESSMENT AND PLAN:  Unstable angina Hypertension Diabetes type 2 Previous history of smoking Obstructive sleep apnea GERD Thyroid disease Anemia . Plan Agree with admit rule out microinfarction Follow-up EKGs troponins Continue Protonix therapy for reflux symptoms Recommend statin therapy for lipid management Continue diabetes medications Recommend further evaluation functional  study versus cardiac Cath Hypertension management and control lisinopril metoprolol Sleep study CPAP possible mild weight loss Echocardiogram for further evaluation evaluation Agree with IV heparin therapy for now   Signed: Alwyn Pea MD, PHD, [facility] 01/20/2020, 9:51 PM

## 2020-01-20 NOTE — Progress Notes (Signed)
PROGRESS NOTE    Joshua Haley  JOA:[id number] DOB: 11-Jul-1951 DOA: 01/19/2020 [facility]: [facility]    Assessment & Plan:   Principal Problem:   Chest pain Active Problems:   T2DM (type 2 diabetes mellitus) (HCC)   Nephrolithiasis   Hypothyroidism   GERD (gastroesophageal reflux disease)   HTN (hypertension)    Joshua Haley is a 69 y.o. Caucasian male with medical history significant for dm, nephrolithiassis, htn, hypothyroidism, gerd, and osa, who presented with chest pain.   # Chest pain  - anginal in nature, several risk factors present, hs troponin mildly elevated in 80's flat. EKG did not have ACS-related changes. Had received aspirin. Says does not tolerate statins. Says negative exercise stress test at the [place] 1-2 years ago, denies personal history CAD. Has [facility] - cardiology consult  # T2DM  - here glucose elevated to 312 - hold home orals - lantus 10 qd, SSI with meals  # HTN  - here bp moderately elevated - cont home lisinopril and metop; CTM  # microcytic anemia  - mild, h 11.9 - f/u iron studies, fecal occult blood  # OSA - cont qhs cpap  # hypothyroid - cont home synthroid  # Gerd - cont home ppi   DVT prophylaxis: Lovenox SQ Code Status: Full code  Family Communication: not today Disposition Plan: Home likely on Monday   Subjective and Interval History:  Pt reported chest pain was better because he was lying in bed not exerting himself.  Pt complained about cold feet all the time.  No fever, dyspnea, abdominal pain, N/V/D, dysuria, increased swelling.    Objective: Vitals:   01/20/20 0811 01/20/20 0823 01/20/20 1609 01/20/20 1609  BP: (!) 127/59  131/71 131/71  Pulse: (!) 59 64  68  Resp: 18  18 18   Temp: (!) 97.5 F (36.4 C)  98.2 F (36.8 C) 98.2 F (36.8 C)  TempSrc: Oral  Oral Oral  SpO2: 97%   97%  Weight:      Height:       No intake  or output data in the 24 hours ending 01/20/20 2030 Filed Weights   01/19/20 1932 01/20/20 0515  Weight: 102.1 kg 103.1 kg    Examination:   Constitutional: NAD, AAOx3 HEENT: conjunctivae and lids normal, EOMI CV: RRR no M,R,G. Distal pulses +2.  No cyanosis.   RESP: CTA B/L, normal respiratory effort  GI: +BS, NTND Extremities: No effusions, edema, or tenderness in BLE SKIN: warm, dry and intact, but feet cool Neuro: II - XII grossly intact.  Sensation intact Psych: Normal mood and affect.  Appropriate judgement and reason   Data Reviewed: I have personally reviewed following labs and imaging studies  CBC: Recent Labs  Lab 01/19/20 1938 01/20/20 0341  WBC 6.6 5.9  HGB 11.9* 11.5*  HCT 37.7* 36.4*  MCV 79.9* 79.5*  PLT 256 236   Basic Metabolic Panel: Recent Labs  Lab 01/19/20 1938 01/20/20 0341  NA 137  --   K 4.3  --   CL 102  --   CO2 26  --   GLUCOSE 312* 187*  BUN 19  --   CREATININE 1.10 0.81  CALCIUM 9.2  --    GFR: Estimated Creatinine Clearance: 106.7 mL/min (by C-G formula based on SCr of 0.81 mg/dL). Liver Function Tests: No results for input(s): AST, ALT, ALKPHOS, BILITOT, PROT, ALBUMIN in  the last 168 hours. No results for input(s): LIPASE, AMYLASE in the last 168 hours. No results for input(s): AMMONIA in the last 168 hours. Coagulation Profile: No results for input(s): INR, PROTIME in the last 168 hours. Cardiac Enzymes: No results for input(s): CKTOTAL, CKMB, CKMBINDEX, TROPONINI in the last 168 hours. BNP (last 3 results) No results for input(s): PROBNP in the last 8760 hours. HbA1C: Recent Labs    01/20/20 0341  HGBA1C 7.9*   CBG: Recent Labs  Lab 01/20/20 0235 01/20/20 0809 01/20/20 1208 01/20/20 1657  GLUCAP 178* 140* 190* 171*   Lipid Profile: No results for input(s): CHOL, HDL, LDLCALC, TRIG, CHOLHDL, LDLDIRECT in the last 72 hours. Thyroid Function Tests: No results for input(s): TSH, T4TOTAL, FREET4, T3FREE, THYROIDAB  in the last 72 hours. Anemia Panel: Recent Labs    01/20/20 0341  FERRITIN 8*  TIBC 461*  IRON 37*   Sepsis Labs: No results for input(s): PROCALCITON, LATICACIDVEN in the last 168 hours.  Recent Results (from the past 240 hour(s))  SARS CORONAVIRUS 2 (TAT 6-24 HRS) Nasopharyngeal Nasopharyngeal Swab     Status: None   Collection Time: 01/20/20  1:40 AM   Specimen: Nasopharyngeal Swab  Result Value Ref Range Status   SARS Coronavirus 2 NEGATIVE NEGATIVE Final    Comment: (NOTE) SARS-CoV-2 target nucleic acids are NOT DETECTED. The SARS-CoV-2 RNA is generally detectable in upper and lower respiratory specimens during the acute phase of infection. Negative results do not preclude SARS-CoV-2 infection, do not rule out co-infections with other pathogens, and should not be used as the sole basis for treatment or other patient management decisions. Negative results must be combined with clinical observations, patient history, and epidemiological information. The expected result is Negative. Fact Sheet for Patients: SugarRoll.be Fact Sheet for Healthcare Providers: https://www.woods-mathews.com/ This test is not yet approved or cleared by the [place] FDA and  has been authorized for detection and/or diagnosis of SARS-CoV-2 by FDA under an Emergency Use Authorization (EUA). This EUA will remain  in effect (meaning this test can be used) for the duration of the COVID-19 declaration under Section 56 4(b)(1) of the Act, 21 U.S.C. section 360bbb-3(b)(1), unless the authorization is terminated or revoked sooner. Performed at [facility], [place] [address], [place], [place] [address]       Radiology Studies: DG Chest 2 View  Result Date: 01/19/2020 CLINICAL DATA:  Medial chest pressure radiating to LEFT arm and jaw for 1 week, increasing in severity today, history diabetes mellitus, former smoker EXAM: CHEST - 2 VIEW COMPARISON:   None FINDINGS: Cardiac monitoring device projects over anterior LEFT chest. Upper normal heart size. Mediastinal contours and pulmonary vascularity normal. Lungs clear. No pulmonary infiltrate, pleural effusion, or pneumothorax. Bones unremarkable. IMPRESSION: No acute abnormalities. Electronically Signed   By: Lavonia Dana M.D.   On: 01/19/2020 19:54     Scheduled Meds: . enoxaparin (LOVENOX) injection  40 mg Subcutaneous Q24H  . insulin aspart  0-15 Units Subcutaneous TID WC  . insulin glargine  10 Units Subcutaneous Daily  . levothyroxine  137 mcg Oral QAC breakfast  . lisinopril  20 mg Oral Daily  . metoprolol succinate  50 mg Oral Daily  . pantoprazole  40 mg Oral Daily   Continuous Infusions:   LOS: 0 days     Enzo Bi, MD [facility] If 7PM-7AM, please contact night-coverage 01/20/2020, 8:30 PM

## 2020-01-21 DIAGNOSIS — Z79899 Other long term (current) drug therapy: Secondary | ICD-10-CM | POA: Diagnosis not present

## 2020-01-21 DIAGNOSIS — E1151 Type 2 diabetes mellitus with diabetic peripheral angiopathy without gangrene: Secondary | ICD-10-CM | POA: Diagnosis present

## 2020-01-21 DIAGNOSIS — Z87891 Personal history of nicotine dependence: Secondary | ICD-10-CM | POA: Diagnosis not present

## 2020-01-21 DIAGNOSIS — K219 Gastro-esophageal reflux disease without esophagitis: Secondary | ICD-10-CM | POA: Diagnosis present

## 2020-01-21 DIAGNOSIS — Z20822 Contact with and (suspected) exposure to covid-19: Secondary | ICD-10-CM | POA: Diagnosis present

## 2020-01-21 DIAGNOSIS — D509 Iron deficiency anemia, unspecified: Secondary | ICD-10-CM | POA: Diagnosis present

## 2020-01-21 DIAGNOSIS — G4733 Obstructive sleep apnea (adult) (pediatric): Secondary | ICD-10-CM | POA: Diagnosis present

## 2020-01-21 DIAGNOSIS — Z79890 Hormone replacement therapy (postmenopausal): Secondary | ICD-10-CM | POA: Diagnosis not present

## 2020-01-21 DIAGNOSIS — Z9049 Acquired absence of other specified parts of digestive tract: Secondary | ICD-10-CM | POA: Diagnosis not present

## 2020-01-21 DIAGNOSIS — Z7984 Long term (current) use of oral hypoglycemic drugs: Secondary | ICD-10-CM | POA: Diagnosis not present

## 2020-01-21 DIAGNOSIS — Z87442 Personal history of urinary calculi: Secondary | ICD-10-CM | POA: Diagnosis not present

## 2020-01-21 DIAGNOSIS — R079 Chest pain, unspecified: Secondary | ICD-10-CM | POA: Diagnosis not present

## 2020-01-21 DIAGNOSIS — E039 Hypothyroidism, unspecified: Secondary | ICD-10-CM | POA: Diagnosis present

## 2020-01-21 DIAGNOSIS — Z885 Allergy status to narcotic agent status: Secondary | ICD-10-CM | POA: Diagnosis not present

## 2020-01-21 DIAGNOSIS — I25110 Atherosclerotic heart disease of native coronary artery with unstable angina pectoris: Secondary | ICD-10-CM | POA: Diagnosis present

## 2020-01-21 DIAGNOSIS — I10 Essential (primary) hypertension: Secondary | ICD-10-CM | POA: Diagnosis present

## 2020-01-21 DIAGNOSIS — R778 Other specified abnormalities of plasma proteins: Secondary | ICD-10-CM | POA: Diagnosis present

## 2020-01-21 LAB — GLUCOSE, CAPILLARY
Glucose-Capillary: 157 mg/dL — ABNORMAL HIGH (ref 70–99)
Glucose-Capillary: 244 mg/dL — ABNORMAL HIGH (ref 70–99)
Glucose-Capillary: 212 mg/dL — ABNORMAL HIGH (ref 70–99)
Glucose-Capillary: 154 mg/dL — ABNORMAL HIGH (ref 70–99)

## 2020-01-21 LAB — BASIC METABOLIC PANEL
Anion gap: 8 (ref 5–15)
BUN: 17 mg/dL (ref 8–23)
CO2: 28 mmol/L (ref 22–32)
Calcium: 9.5 mg/dL (ref 8.9–10.3)
Chloride: 100 mmol/L (ref 98–111)
Creatinine, Ser: 0.87 mg/dL (ref 0.61–1.24)
GFR calc Af Amer: >60 mL/min (ref >60)
GFR calc non Af Amer: >60 mL/min (ref >60)
Glucose, Bld: 240 mg/dL — ABNORMAL HIGH (ref 70–99)
Potassium: 4.1 mmol/L (ref 3.5–5.1)
Sodium: 136 mmol/L (ref 135–145)

## 2020-01-21 LAB — CBC
HCT: 43 % (ref 39.0–52.0)
Hemoglobin: 13.2 g/dL (ref 13.0–17.0)
MCH: 24.7 pg — ABNORMAL LOW (ref 26.0–34.0)
MCHC: 30.7 g/dL (ref 30.0–36.0)
MCV: 80.5 fL (ref 80.0–100.0)
Platelets: 278 10*3/uL (ref 150–400)
RBC: 5.34 MIL/uL (ref 4.22–5.81)
RDW: 14.3 % (ref 11.5–15.5)
WBC: 8.4 10*3/uL (ref 4.0–10.5)
nRBC: 0 % (ref 0.0–0.2)

## 2020-01-21 LAB — TROPONIN I (HIGH SENSITIVITY): Troponin I (High Sensitivity): 57 ng/L — ABNORMAL HIGH (ref <18)

## 2020-01-21 LAB — GLUCOSE, RANDOM: Glucose, Bld: 173 mg/dL — ABNORMAL HIGH (ref 70–99)

## 2020-01-21 LAB — MAGNESIUM: Magnesium: 1.7 mg/dL (ref 1.7–2.4)

## 2020-01-21 MED ORDER — SODIUM CHLORIDE 0.9% FLUSH: 3.0000 mL | INTRAVENOUS | Status: DC | PRN

## 2020-01-21 MED ORDER — SODIUM CHLORIDE 0.9 % WEIGHT BASED INFUSION: 1.0000 mL/kg/h | INTRAVENOUS | Status: DC

## 2020-01-21 MED ORDER — ASPIRIN 81 MG PO CHEW
81.0000 mg | CHEWABLE_TABLET | ORAL | Status: AC
  Administered 2020-01-22: 06:00:00 81 mg via ORAL
  Filled 2020-01-21: qty 1

## 2020-01-21 MED ORDER — SODIUM CHLORIDE 0.9 % WEIGHT BASED INFUSION
3.0000 mL/kg/h | INTRAVENOUS | Status: AC
  Administered 2020-01-22: 3 mL/kg/h via INTRAVENOUS

## 2020-01-21 MED ORDER — SODIUM CHLORIDE 0.9 % IV SOLN: 250.0000 mL | INTRAVENOUS | Status: DC | PRN

## 2020-01-21 NOTE — Progress Notes (Signed)
PROGRESS NOTE    [name]  VOJ:[id number] DOB: Dec 26, 1951 DOA: 01/19/2020 PCP: [facility]    Assessment & Plan:   Principal Problem:   Chest pain Active Problems:   T2DM (type 2 diabetes mellitus) (HCC)   Nephrolithiasis   Hypothyroidism   GERD (gastroesophageal reflux disease)   HTN (hypertension)    Joshua Haley is a 69 y.o. Caucasian male with medical history significant for dm, nephrolithiassis, htn, hypothyroidism, gerd, and osa, who presented with chest pain.   # Chest pain  - anginal in nature, several risk factors present, hs troponin mildly elevated in 80's flat. EKG did not have ACS-related changes. Had received aspirin. Says does not tolerate statins. Says negative exercise stress test at the [place] 1-2 years ago, denies personal history CAD. Has [facility] thus [facility]. - cardiology consult, stress test vs heart cath tomorrow  # T2DM  - here glucose elevated to 312 - hold home orals - lantus 10 qd, SSI with meals  # HTN  - here bp moderately elevated - cont home lisinopril and metop; CTM  # microcytic anemia  - mild, h 11.9 - f/u iron studies  # OSA - cont qhs cpap  # hypothyroid - cont home synthroid  # Gerd - cont home ppi   DVT prophylaxis: Lovenox SQ Code Status: Full code  Family Communication: not today Disposition Plan: Home likely on Monday   Subjective and Interval History:  Pt reported no chest pain as long as he was lying in bed.  No fever, dyspnea, abdominal pain, N/V/D, dysuria, increased swelling.  Normal PO intake.    Objective: Vitals:   01/20/20 2047 01/21/20 0522 01/21/20 0754 01/21/20 1549  BP: 135/75 135/75 129/89 135/75  Pulse: 64 61 66 62  Resp: 20 14 16 16   Temp: 98 F (36.7 C) 97.7 F (36.5 C) 97.8 F (36.6 C) 97.8 F (36.6 C)  TempSrc: Oral Oral Oral Oral  SpO2: 98% 98% 99% 98%  Weight:  103.4 kg    Height:         Intake/Output Summary (Last 24 hours) at 01/21/2020 1653 Last data filed at 01/21/2020 0900 Gross per 24 hour  Intake 360 ml  Output --  Net 360 ml   Filed Weights   01/19/20 1932 01/20/20 0515 01/21/20 0522  Weight: 102.1 kg 103.1 kg 103.4 kg    Examination:   Constitutional: NAD, AAOx3 HEENT: conjunctivae and lids normal, EOMI CV: RRR no M,R,G. Distal pulses +2.  No cyanosis.   RESP: CTA B/L, normal respiratory effort  GI: +BS, NTND Extremities: No effusions, edema, or tenderness in BLE SKIN: warm, dry and intact, but feet cool Neuro: II - XII grossly intact.  Sensation intact Psych: Normal mood and affect.  Appropriate judgement and reason   Data Reviewed: I have personally reviewed following labs and imaging studies  CBC: Recent Labs  Lab 01/19/20 1938 01/20/20 0341 01/21/20 0949  WBC 6.6 5.9 8.4  HGB 11.9* 11.5* 13.2  HCT 37.7* 36.4* 43.0  MCV 79.9* 79.5* 80.5  PLT 256 236 278   Basic Metabolic Panel: Recent Labs  Lab 01/19/20 1938 01/20/20 0341 01/21/20 0428 01/21/20 0949  NA 137  --   --  136  K 4.3  --   --  4.1  CL 102  --   --  100  CO2 26  --   --  28  GLUCOSE 312*  187* 173* 240*  BUN 19  --   --  17  CREATININE 1.10 0.81  --  0.87  CALCIUM 9.2  --   --  9.5  MG  --   --   --  1.7   GFR: Estimated Creatinine Clearance: 99.4 mL/min (by C-G formula based on SCr of 0.87 mg/dL). Liver Function Tests: No results for input(s): AST, ALT, ALKPHOS, BILITOT, PROT, ALBUMIN in the last 168 hours. No results for input(s): LIPASE, AMYLASE in the last 168 hours. No results for input(s): AMMONIA in the last 168 hours. Coagulation Profile: No results for input(s): INR, PROTIME in the last 168 hours. Cardiac Enzymes: No results for input(s): CKTOTAL, CKMB, CKMBINDEX, TROPONINI in the last 168 hours. BNP (last 3 results) No results for input(s): PROBNP in the last 8760 hours. HbA1C: Recent Labs    01/20/20 0341  HGBA1C 7.9*   CBG: Recent Labs   Lab 01/20/20 1657 01/20/20 2044 01/21/20 0753 01/21/20 1101 01/21/20 1627  GLUCAP 171* 188* 157* 244* 212*   Lipid Profile: No results for input(s): CHOL, HDL, LDLCALC, TRIG, CHOLHDL, LDLDIRECT in the last 72 hours. Thyroid Function Tests: No results for input(s): TSH, T4TOTAL, FREET4, T3FREE, THYROIDAB in the last 72 hours. Anemia Panel: Recent Labs    01/20/20 0341  FERRITIN 8*  TIBC 461*  IRON 37*   Sepsis Labs: No results for input(s): PROCALCITON, LATICACIDVEN in the last 168 hours.  Recent Results (from the past 240 hour(s))  SARS CORONAVIRUS 2 (TAT 6-24 HRS) Nasopharyngeal Nasopharyngeal Swab     Status: None   Collection Time: 01/20/20  1:40 AM   Specimen: Nasopharyngeal Swab  Result Value Ref Range Status   SARS Coronavirus 2 NEGATIVE NEGATIVE Final    Comment: (NOTE) SARS-CoV-2 target nucleic acids are NOT DETECTED. The SARS-CoV-2 RNA is generally detectable in upper and lower respiratory specimens during the acute phase of infection. Negative results do not preclude SARS-CoV-2 infection, do not rule out co-infections with other pathogens, and should not be used as the sole basis for treatment or other patient management decisions. Negative results must be combined with clinical observations, patient history, and epidemiological information. The expected result is Negative. Fact Sheet for Patients: HairSlick.no Fact Sheet for Healthcare Providers: quierodirigir.com This test is not yet approved or cleared by the [place] FDA and  has been authorized for detection and/or diagnosis of SARS-CoV-2 by FDA under an Emergency Use Authorization (EUA). This EUA will remain  in effect (meaning this test can be used) for the duration of the COVID-19 declaration under Section 56 4(b)(1) of the Act, 21 U.S.C. section 360bbb-3(b)(1), unless the authorization is terminated or revoked sooner. Performed at [facility], [address] [place], [place] [address]       Radiology Studies: DG Chest 2 View  Result Date: 01/19/2020 CLINICAL DATA:  Medial chest pressure radiating to LEFT arm and jaw for 1 week, increasing in severity today, history diabetes mellitus, former smoker EXAM: CHEST - 2 VIEW COMPARISON:  None FINDINGS: Cardiac monitoring device projects over anterior LEFT chest. Upper normal heart size. Mediastinal contours and pulmonary vascularity normal. Lungs clear. No pulmonary infiltrate, pleural effusion, or pneumothorax. Bones unremarkable. IMPRESSION: No acute abnormalities. Electronically Signed   By: Ulyses Southward M.D.   On: 01/19/2020 19:54     Scheduled Meds: . [START ON 01/22/2020] aspirin  81 mg Oral Pre-Cath  . enoxaparin (LOVENOX) injection  40 mg Subcutaneous Q24H  . insulin aspart  0-15  Units Subcutaneous TID WC  . insulin glargine  10 Units Subcutaneous Daily  . levothyroxine  137 mcg Oral QAC breakfast  . lisinopril  20 mg Oral Daily  . metoprolol succinate  50 mg Oral Daily  . pantoprazole  40 mg Oral Daily  . sodium chloride flush  3 mL Intravenous Q12H   Continuous Infusions: . sodium chloride    . [START ON 01/22/2020] sodium chloride     Followed by  . [START ON 01/22/2020] sodium chloride       LOS: 0 days     Enzo Bi, MD [facility] If 7PM-7AM, please contact night-coverage 01/21/2020, 4:53 PM

## 2020-01-21 NOTE — Progress Notes (Signed)
[facility]    SUBJECTIVE: Patient doing reasonably well denies any recent anginal type symptoms no worsening chest pain.  Patient resting comfortably states he uses CPAP last night.   Vitals:   01/21/20 0522 01/21/20 0754 01/21/20 1549 01/21/20 1932  BP: 135/75 129/89 135/75 133/80  Pulse: 61 66 62 67  Resp: 14 16 16 19   Temp: 97.7 F (36.5 C) 97.8 F (36.6 C) 97.8 F (36.6 C) 98.4 F (36.9 C)  TempSrc: Oral Oral Oral Oral  SpO2: 98% 99% 98% 96%  Weight: 103.4 kg     Height:         Intake/Output Summary (Last 24 hours) at 01/21/2020 2228 Last data filed at 01/21/2020 2127 Gross per 24 hour  Intake 360 ml  Output 0 ml  Net 360 ml      PHYSICAL EXAM  General: Well developed, well nourished, in no acute distress HEENT:  Normocephalic and atramatic Neck:  No JVD.  Lungs: Clear bilaterally to auscultation and percussion. Heart: HRRR . Normal S1 and S2 without gallops or murmurs.  Abdomen: Bowel sounds are positive, abdomen soft and non-tender  Msk:  Back normal, normal gait. Normal strength and tone for age. Extremities: No clubbing, cyanosis or edema.   Neuro: Alert and oriented X 3. Psych:  Good affect, responds appropriately   LABS: Basic Metabolic Panel: Recent Labs    01/19/20 1938 01/19/20 1938 01/20/20 0341 01/20/20 0341 01/21/20 0428 01/21/20 0949  NA 137  --   --   --   --  136  K 4.3  --   --   --   --  4.1  CL 102  --   --   --   --  100  CO2 26  --   --   --   --  28  GLUCOSE 312*   < > 187*   < > 173* 240*  BUN 19  --   --   --   --  17  CREATININE 1.10   < > 0.81  --   --  0.87  CALCIUM 9.2  --   --   --   --  9.5  MG  --   --   --   --   --  1.7   < > = values in this interval not displayed.   Liver Function Tests: No results for input(s): AST, ALT, ALKPHOS, BILITOT, PROT, ALBUMIN in the last 72 hours. No results for input(s): LIPASE, AMYLASE in the last 72 hours. CBC: Recent Labs    01/20/20 0341 01/21/20 0949  WBC 5.9 8.4   HGB 11.5* 13.2  HCT 36.4* 43.0  MCV 79.5* 80.5  PLT 236 278   Cardiac Enzymes: No results for input(s): CKTOTAL, CKMB, CKMBINDEX, TROPONINI in the last 72 hours. BNP: Invalid input(s): POCBNP D-Dimer: No results for input(s): DDIMER in the last 72 hours. Hemoglobin A1C: Recent Labs    01/20/20 0341  HGBA1C 7.9*   Fasting Lipid Panel: No results for input(s): CHOL, HDL, LDLCALC, TRIG, CHOLHDL, LDLDIRECT in the last 72 hours. Thyroid Function Tests: No results for input(s): TSH, T4TOTAL, T3FREE, THYROIDAB in the last 72 hours.  Invalid input(s): FREET3 Anemia Panel: Recent Labs    01/20/20 0341  FERRITIN 8*  TIBC 461*  IRON 37*    No results found.     TELEMETRY: Normal sinus rhythm nonspecific ST-T wave changes  ASSESSMENT AND PLAN:  Principal Problem:   Chest pain Active Problems:  T2DM (type 2 diabetes mellitus) ([place])   Nephrolithiasis   Hypothyroidism   GERD (gastroesophageal reflux disease)   HTN (hypertension) Possible angina Previous history of smoking Obstructive sleep apnea . Plan Continue telemetry Reviewed evaluation of EKG and troponins Continue diabetes management and control Maintain CPAP therapy for obstructive sleep apnea Agree with hypertension management and control with metoprolol lisinopril Recommend cardiac cath in the morning for assessment of unstable angina Continue GERD therapy including Protonix for reflux symptoms    Yolonda Kida, MD, PHD [facility] 01/21/2020 10:28 PM

## 2020-01-22 ENCOUNTER — Encounter
Admission: EM | Disposition: A | Discharge status: Other Acute Inpatient Hospital | Payer: Self-pay | Source: Home / Self Care | Attending: Hospitalist

## 2020-01-22 ENCOUNTER — Encounter: Payer: Self-pay | Admitting: Cardiology

## 2020-01-22 HISTORY — PX: LEFT HEART CATH AND CORONARY ANGIOGRAPHY: CATH118249

## 2020-01-22 LAB — BASIC METABOLIC PANEL
Anion gap: 9 (ref 5–15)
BUN: 17 mg/dL (ref 8–23)
CO2: 26 mmol/L (ref 22–32)
Calcium: 9.4 mg/dL (ref 8.9–10.3)
Chloride: 103 mmol/L (ref 98–111)
Creatinine, Ser: 0.91 mg/dL (ref 0.61–1.24)
GFR calc Af Amer: >60 mL/min (ref >60)
GFR calc non Af Amer: >60 mL/min (ref >60)
Glucose, Bld: 226 mg/dL — ABNORMAL HIGH (ref 70–99)
Potassium: 4.1 mmol/L (ref 3.5–5.1)
Sodium: 138 mmol/L (ref 135–145)

## 2020-01-22 LAB — LIPID PANEL
Cholesterol: 246 mg/dL — ABNORMAL HIGH (ref 0–200)
HDL: 33 mg/dL — ABNORMAL LOW (ref >40)
LDL Cholesterol: 157 mg/dL — ABNORMAL HIGH (ref 0–99)
Total CHOL/HDL Ratio: 7.5 RATIO
Triglycerides: 279 mg/dL — ABNORMAL HIGH (ref <150)
VLDL: 56 mg/dL — ABNORMAL HIGH (ref 0–40)

## 2020-01-22 LAB — CBC
HCT: 41.3 % (ref 39.0–52.0)
Hemoglobin: 12.9 g/dL — ABNORMAL LOW (ref 13.0–17.0)
MCH: 25 pg — ABNORMAL LOW (ref 26.0–34.0)
MCHC: 31.2 g/dL (ref 30.0–36.0)
MCV: 79.9 fL — ABNORMAL LOW (ref 80.0–100.0)
Platelets: 251 10*3/uL (ref 150–400)
RBC: 5.17 MIL/uL (ref 4.22–5.81)
RDW: 14.1 % (ref 11.5–15.5)
WBC: 7.4 10*3/uL (ref 4.0–10.5)
nRBC: 0 % (ref 0.0–0.2)

## 2020-01-22 LAB — GLUCOSE, CAPILLARY
Glucose-Capillary: 208 mg/dL — ABNORMAL HIGH (ref 70–99)
Glucose-Capillary: 194 mg/dL — ABNORMAL HIGH (ref 70–99)
Glucose-Capillary: 316 mg/dL — ABNORMAL HIGH (ref 70–99)

## 2020-01-22 LAB — MAGNESIUM: Magnesium: 1.5 mg/dL — ABNORMAL LOW (ref 1.7–2.4)

## 2020-01-22 SURGERY — LEFT HEART CATH AND CORONARY ANGIOGRAPHY
Anesthesia: Moderate Sedation

## 2020-01-22 MED ORDER — INSULIN GLARGINE 100 UNIT/ML ~~LOC~~ SOLN
15.0000 [IU] | Freq: Every day | SUBCUTANEOUS | Status: DC
  Administered 2020-01-22: 15 [IU] via SUBCUTANEOUS
  Filled 2020-01-22 (×2): qty 0.15

## 2020-01-22 MED ORDER — SODIUM CHLORIDE 0.9% FLUSH: 3.0000 mL | INTRAVENOUS | Status: DC | PRN

## 2020-01-22 MED ORDER — ONDANSETRON HCL 4 MG/2ML IJ SOLN: 4.0000 mg | Freq: Four times a day (QID) | INTRAMUSCULAR | Status: DC | PRN

## 2020-01-22 MED ORDER — HEPARIN (PORCINE) IN NACL 1000-0.9 UT/500ML-% IV SOLN
INTRAVENOUS | Status: AC
  Filled 2020-01-22: qty 1000

## 2020-01-22 MED ORDER — MIDAZOLAM HCL 2 MG/2ML IJ SOLN
INTRAMUSCULAR | Status: DC | PRN
  Administered 2020-01-22: 1 mg via INTRAVENOUS

## 2020-01-22 MED ORDER — LISINOPRIL 20 MG PO TABS: 20.0000 mg | ORAL_TABLET | Freq: Every day | ORAL | Status: DC

## 2020-01-22 MED ORDER — HEPARIN (PORCINE) IN NACL 1000-0.9 UT/500ML-% IV SOLN
INTRAVENOUS | Status: DC | PRN
  Administered 2020-01-22: 500 mL

## 2020-01-22 MED ORDER — INSULIN GLARGINE 100 UNIT/ML ~~LOC~~ SOLN: 15.0000 [IU] | Freq: Every day | SUBCUTANEOUS | Status: DC

## 2020-01-22 MED ORDER — ASPIRIN 81 MG PO CHEW: 81.0000 mg | CHEWABLE_TABLET | Freq: Every day | ORAL | Status: DC

## 2020-01-22 MED ORDER — FENTANYL CITRATE (PF) 100 MCG/2ML IJ SOLN
INTRAMUSCULAR | Status: AC
  Filled 2020-01-22: qty 2

## 2020-01-22 MED ORDER — LABETALOL HCL 5 MG/ML IV SOLN: 10.0000 mg | INTRAVENOUS | Status: DC | PRN

## 2020-01-22 MED ORDER — IOHEXOL 300 MG/ML  SOLN
INTRAMUSCULAR | Status: DC | PRN
  Administered 2020-01-22: 120 mL

## 2020-01-22 MED ORDER — SODIUM CHLORIDE 0.9 % IV SOLN: 250.0000 mL | INTRAVENOUS | Status: DC | PRN

## 2020-01-22 MED ORDER — SODIUM CHLORIDE 0.9% FLUSH: 3.0000 mL | Freq: Two times a day (BID) | INTRAVENOUS | Status: DC

## 2020-01-22 MED ORDER — GLYBURIDE 5 MG PO TABS: ORAL_TABLET | ORAL | Status: DC

## 2020-01-22 MED ORDER — METFORMIN HCL 1000 MG PO TABS: ORAL_TABLET | ORAL | Status: DC

## 2020-01-22 MED ORDER — ACETAMINOPHEN 325 MG PO TABS: 650.0000 mg | ORAL_TABLET | ORAL | Status: DC | PRN

## 2020-01-22 MED ORDER — SODIUM CHLORIDE 0.9 % WEIGHT BASED INFUSION: 1.0000 mL/kg/h | INTRAVENOUS | Status: DC

## 2020-01-22 MED ORDER — MIDAZOLAM HCL 2 MG/2ML IJ SOLN
INTRAMUSCULAR | Status: AC
  Filled 2020-01-22: qty 2

## 2020-01-22 MED ORDER — FENTANYL CITRATE (PF) 100 MCG/2ML IJ SOLN
INTRAMUSCULAR | Status: DC | PRN
  Administered 2020-01-22: 25 ug via INTRAVENOUS

## 2020-01-22 MED ORDER — MELOXICAM 15 MG PO TABS: 15.0000 mg | ORAL_TABLET | Freq: Every day | ORAL | Status: DC | PRN

## 2020-01-22 MED ORDER — HYDRALAZINE HCL 20 MG/ML IJ SOLN: 10.0000 mg | INTRAMUSCULAR | Status: DC | PRN

## 2020-01-22 MED ORDER — IRON 28 MG PO TABS: 1.0000 | ORAL_TABLET | Freq: Every day | ORAL | Status: DC

## 2020-01-22 SURGICAL SUPPLY — 9 items
CATH INFINITI 5FR ANG PIGTAIL (CATHETERS) ×3 IMPLANT
CATH INFINITI 5FR JL4 (CATHETERS) ×3 IMPLANT
CATH INFINITI JR4 5F (CATHETERS) ×3 IMPLANT
DEVICE CLOSURE MYNXGRIP 5F (Vascular Products) ×3 IMPLANT
KIT MANI 3VAL PERCEP (MISCELLANEOUS) ×3 IMPLANT
NEEDLE PERC 18GX7CM (NEEDLE) ×3 IMPLANT
PACK CARDIAC CATH (CUSTOM PROCEDURE TRAY) ×3 IMPLANT
SHEATH AVANTI 5FR X 11CM (SHEATH) ×3 IMPLANT
WIRE GUIDERIGHT .035X150 (WIRE) ×6 IMPLANT

## 2020-01-22 NOTE — Discharge Summary (Signed)
Physician Discharge Summary   Joshua Haley  male DOB: 1951/05/21  TMH:[id number]  PCP: [facility]  Admit date: 01/19/2020 Discharge date:   Admitted From: home Disposition:  Transfer to [place] for CABG CODE STATUS: Full code  Discharge Instructions    Diet - low sodium heart healthy   Complete by: As directed    Increase activity slowly   Complete by: As directed        Hospital Course:  For full details, please see H&P, progress notes, consult notes and ancillary notes.  Briefly,  Joshua Haley a 69 y.o.Caucasian malewith medical history significant fordm, nephrolithiassis, htn, hypothyroidism, gerd, and osa, who presented with chest pain.   # Chest pain, unstable angina # Severe multi-vessel disease Pt presented for chest pain with minimum exertion.  Last negative exercise stress test 2 years ago at the [place].  On presentation,  His troponin mildly elevated in 80's flat. EKG did not have ACS-related changes. Pt reported he does not tolerate statins. No personal history CAD.  Left heart cath revealed severe multi-vessel disease  Prox RCA lesion is 99% stenosed.  Prox LAD lesion is 90% stenosed.  1st Diag lesion is 80% stenosed.  Mid LAD lesion is 50% stenosed.  1st Mrg lesion is 100% stenosed.  RPAV lesion is 50% stenosed.  Low normal left ventricular function mild inferior hypoejection fraction between 50 and 55%  Pt is being transferred to [facility] for likely CABG.  # T2DM A1c 7.9.  Pt is on glyburide and metformin at home, which were held during hospitalization.  Pt has been receiving Lantus 15u daily and SSI with meals while inpatient.  # HTN  Here bp moderately elevated.  Home Lisinopril was increased to 20mg  daily, and home Toprol 50 mg daily continued.  # Microcytic anemia due to iron def Hgb 11.9 on presentation.  Iron profile showed iron def, iron 37, 8% sat.  Pt will need iron supplement after discharge, and outpatient followup to  rule out chronic blood loss.  # OSA Continued qhs cpap  # hypothyroid Continued home synthroid  # Gerd Continued home PPI  # Chronic cold feet Both feet normal color, however, pedal pulses were difficult to palpate.  Considering pt has severe CAD, he may also have PVD.  Recommend outpatient vascular surgery followup to assess blood flow to his LE's.   Discharge Diagnoses:  Principal Problem:   Chest pain Active Problems:   T2DM (type 2 diabetes mellitus) ([place])   Nephrolithiasis   Hypothyroidism   GERD (gastroesophageal reflux disease)   HTN (hypertension)    Discharge Instructions:  Allergies as of 01/22/2020      Reactions   Morphine And Related       Medication List    TAKE these medications   aspirin 81 MG chewable tablet Chew 81 mg by mouth daily.   Cyanocobalamin 1000 MCG/15ML Liqd Take 1,000 mcg by mouth daily.   glyBURIDE 5 MG tablet Commonly known as: DIABETA Hold while inpatient. What changed:   how much to take  how to take this  when to take this  additional instructions   insulin glargine 100 UNIT/ML injection Commonly known as: LANTUS Inject 0.15 mLs (15 Units total) into the skin daily. Start taking on: January 23, 2020   Iron 28 MG Tabs Take 1 tablet (28 mg total) by mouth daily. Can take any over the counter iron supplements after discharge.   ketoconazole 2 % cream Commonly known as: NIZORAL  Apply 1 application topically 2 (two) times daily.   ketorolac 0.4 % Soln Commonly known as: ACULAR Place 1 drop into the right eye 4 (four) times daily as needed (pain).   levothyroxine 137 MCG tablet Commonly known as: SYNTHROID Take 137 mcg by mouth daily before breakfast.   lisinopril 20 MG tablet Commonly known as: ZESTRIL Take 1 tablet (20 mg total) by mouth daily. What changed: how much to take   meloxicam 15 MG tablet Commonly known as: MOBIC Take 1 tablet (15 mg total) by mouth daily as needed for pain. What changed:    when to take this  reasons to take this   metFORMIN 1000 MG tablet Commonly known as: GLUCOPHAGE Hold while inpatient. What changed:   how much to take  how to take this  when to take this  additional instructions   metoprolol succinate 50 MG 24 hr tablet Commonly known as: TOPROL-XL Take 50 mg by mouth daily. Take with or immediately following a meal.   pantoprazole 40 MG tablet Commonly known as: PROTONIX Take 40 mg by mouth daily.   Vitamin D3 125 MCG (5000 UT) Caps Take 5,000 Units by mouth daily.         Allergies  Allergen Reactions  . Morphine And Related      The results of significant diagnostics from this hospitalization (including imaging, microbiology, ancillary and laboratory) are listed below for reference.   Consultations:   Procedures/Studies: DG Chest 2 View  Result Date: 01/19/2020 CLINICAL DATA:  Medial chest pressure radiating to LEFT arm and jaw for 1 week, increasing in severity today, history diabetes mellitus, former smoker EXAM: CHEST - 2 VIEW COMPARISON:  None FINDINGS: Cardiac monitoring device projects over anterior LEFT chest. Upper normal heart size. Mediastinal contours and pulmonary vascularity normal. Lungs clear. No pulmonary infiltrate, pleural effusion, or pneumothorax. Bones unremarkable. IMPRESSION: No acute abnormalities. Electronically Signed   By: Ulyses Southward M.D.   On: 01/19/2020 19:54   CARDIAC CATHETERIZATION  Result Date: 01/22/2020  Prox RCA lesion is 99% stenosed.  Prox LAD lesion is 90% stenosed.  1st Diag lesion is 80% stenosed.  Mid LAD lesion is 50% stenosed.  1st Mrg lesion is 100% stenosed.  RPAV lesion is 50% stenosed.  Low normal left ventricular function mild inferior hypoejection fraction between 50 and 55%  Conclusion Successful diagnostic cardiac cath Preserved left ventricular function between 50 and 55% with mild inferior hypo- Patent left main Large LAD with complex bifurcation lesion in the  proximal segment 90% in LAD 80% and ostial diagonal The vessel had some evidence of ectasia and there was a mid 50% lesion Circumflex was large with a flush occlusion of the OM branch that appeared to be a CTO RCA was large with a proximal 99% lesion ulcerated with filling defect like thrombus but TIMI-3 flow probably his symptomatic vessel No evidence of collaterals Minx was deployed Intervention was deferred Patient is referred [facility] for possible coronary bypass surgery      Labs: BNP (last 3 results) No results for input(s): BNP in the last 8760 hours. Basic Metabolic Panel: Recent Labs  Lab 01/19/20 1938 01/20/20 0341 01/21/20 0428 01/21/20 0949 01/22/20 0552  NA 137  --   --  136 138  K 4.3  --   --  4.1 4.1  CL 102  --   --  100 103  CO2 26  --   --  28 26  GLUCOSE 312* 187* 173* 240* 226*  BUN 19  --   --  17 17  CREATININE 1.10 0.81  --  0.87 0.91  CALCIUM 9.2  --   --  9.5 9.4  MG  --   --   --  1.7 1.5*   Liver Function Tests: No results for input(s): AST, ALT, ALKPHOS, BILITOT, PROT, ALBUMIN in the last 168 hours. No results for input(s): LIPASE, AMYLASE in the last 168 hours. No results for input(s): AMMONIA in the last 168 hours. CBC: Recent Labs  Lab 01/19/20 1938 01/20/20 0341 01/21/20 0949 01/22/20 0552  WBC 6.6 5.9 8.4 7.4  HGB 11.9* 11.5* 13.2 12.9*  HCT 37.7* 36.4* 43.0 41.3  MCV 79.9* 79.5* 80.5 79.9*  PLT 256 236 278 251   Cardiac Enzymes: No results for input(s): CKTOTAL, CKMB, CKMBINDEX, TROPONINI in the last 168 hours. BNP: Invalid input(s): POCBNP CBG: Recent Labs  Lab 01/21/20 1627 01/21/20 2125 01/22/20 0731 01/22/20 0945 01/22/20 1208  GLUCAP 212* 154* 208* 194* 316*   D-Dimer No results for input(s): DDIMER in the last 72 hours. Hgb A1c Recent Labs    01/20/20 0341  HGBA1C 7.9*   Lipid Profile No results for input(s): CHOL, HDL, LDLCALC, TRIG, CHOLHDL, LDLDIRECT in the last 72 hours. Thyroid function studies No results  for input(s): TSH, T4TOTAL, T3FREE, THYROIDAB in the last 72 hours.  Invalid input(s): FREET3 Anemia work up Recent Labs    01/20/20 0341  FERRITIN 8*  TIBC 461*  IRON 37*   Urinalysis No results found for: COLORURINE, APPEARANCEUR, LABSPEC, PHURINE, GLUCOSEU, HGBUR, BILIRUBINUR, KETONESUR, PROTEINUR, UROBILINOGEN, NITRITE, LEUKOCYTESUR Sepsis Labs Invalid input(s): PROCALCITONIN,  WBC,  LACTICIDVEN Microbiology Recent Results (from the past 240 hour(s))  SARS CORONAVIRUS 2 (TAT 6-24 HRS) Nasopharyngeal Nasopharyngeal Swab     Status: None   Collection Time: 01/20/20  1:40 AM   Specimen: Nasopharyngeal Swab  Result Value Ref Range Status   SARS Coronavirus 2 NEGATIVE NEGATIVE Final    Comment: (NOTE) SARS-CoV-2 target nucleic acids are NOT DETECTED. The SARS-CoV-2 RNA is generally detectable in upper and lower respiratory specimens during the acute phase of infection. Negative results do not preclude SARS-CoV-2 infection, do not rule out co-infections with other pathogens, and should not be used as the sole basis for treatment or other patient management decisions. Negative results must be combined with clinical observations, patient history, and epidemiological information. The expected result is Negative. Fact Sheet for Patients: HairSlick.no Fact Sheet for Healthcare Providers: quierodirigir.com This test is not yet approved or cleared by the [place] FDA and  has been authorized for detection and/or diagnosis of SARS-CoV-2 by FDA under an Emergency Use Authorization (EUA). This EUA will remain  in effect (meaning this test can be used) for the duration of the COVID-19 declaration under Section 56 4(b)(1) of the Act, 21 U.S.C. section 360bbb-3(b)(1), unless the authorization is terminated or revoked sooner. Performed at [facility], [address], [place], [place] [address]      Total time spend on  discharging this patient, including the last patient exam, discussing the hospital stay, instructions for ongoing care as it relates to all pertinent caregivers, as well as preparing the medical discharge records, prescriptions, and/or referrals as applicable, is 40 minutes.    Darlin Priestly, MD  [facility] 01/22/2020, 12:57 PM  If 7PM-7AM, please contact night-coverage

## 2020-01-22 NOTE — Progress Notes (Signed)
[facility] bed assignment given - rm [id number] pt updated/ report called to Gerilyn Pilgrim at [employer] transportation to transport

## 2020-02-21 ENCOUNTER — Encounter: Payer: No Typology Code available for payment source | Attending: Internal Medicine | Admitting: *Deleted

## 2020-02-21 ENCOUNTER — Other Ambulatory Visit: Payer: Self-pay

## 2020-02-21 ENCOUNTER — Encounter: Payer: Self-pay | Admitting: *Deleted

## 2020-02-21 DIAGNOSIS — Z951 Presence of aortocoronary bypass graft: Secondary | ICD-10-CM | POA: Insufficient documentation

## 2020-02-21 NOTE — Progress Notes (Signed)
Completed virtual orientation today.  EP evaluation is scheduled for Thursday 2/25  at 930am.  Documentation for diagnosis can be found in [facility] encounter 01/26/20.

## 2020-02-22 ENCOUNTER — Other Ambulatory Visit: Payer: Self-pay

## 2020-02-22 ENCOUNTER — Encounter: Payer: No Typology Code available for payment source | Admitting: *Deleted

## 2020-02-22 VITALS — Ht 72.5 in | Wt 218.0 lb

## 2020-02-22 DIAGNOSIS — Z951 Presence of aortocoronary bypass graft: Secondary | ICD-10-CM | POA: Diagnosis present

## 2020-02-22 NOTE — Patient Instructions (Signed)
Patient Instructions  Patient Details  Name: Joshua Haley MRN: [id number] Date of Birth: 1951-03-16 Referring Provider:  Yehuda Savannah, MD  Below are your personal goals for exercise, nutrition, and risk factors. Our goal is to help you stay on track towards obtaining and maintaining these goals. We will be discussing your progress on these goals with you throughout the program.  Initial Exercise Prescription: Initial Exercise Prescription - 02/22/20 1100      Date of Initial Exercise RX and Referring Provider   Date  02/22/20    Referring Provider  Wyline Mood [place] ([facility])      Treadmill   MPH  3    Grade  0.5    Minutes  15    METs  3.5      Elliptical   Level  1    Speed  3.2    Minutes  15      REL-XR   Level  3    Speed  50    Minutes  15    METs  3      T5 Nustep   Level  3    SPM  80    Minutes  15    METs  3      Prescription Details   Frequency (times per week)  3    Duration  Progress to 30 minutes of continuous aerobic without signs/symptoms of physical distress      Intensity   THRR 40-80% of Max Heartrate  114-139    Ratings of Perceived Exertion  11-13    Perceived Dyspnea  0-4      Progression   Progression  Continue to progress workloads to maintain intensity without signs/symptoms of physical distress.      Resistance Training   Training Prescription  Yes    Weight  3 lb    Reps  10-15       Exercise Goals: Frequency: Be able to perform aerobic exercise two to three times per week in program working toward 2-5 days per week of home exercise.  Intensity: Work with a perceived exertion of 11 (fairly light) - 15 (hard) while following your exercise prescription.  We will make changes to your prescription with you as you progress through the program.   Duration: Be able to do 30 to 45 minutes of continuous aerobic exercise in addition to a 5 minute warm-up and a 5 minute cool-down routine.   Nutrition Goals: Your personal nutrition  goals will be established when you do your nutrition analysis with the dietician.  The following are general nutrition guidelines to follow: Cholesterol < 200mg /day Sodium < 1500mg /day Fiber: Men over 50 yrs - 30 grams per day  Personal Goals: Personal Goals and Risk Factors at Admission - 02/22/20 1205      Core Components/Risk Factors/Patient Goals on Admission    Weight Management  Yes;Weight Loss    Intervention  Weight Management: Develop a combined nutrition and exercise program designed to reach desired caloric intake, while maintaining appropriate intake of nutrient and fiber, sodium and fats, and appropriate energy expenditure required for the weight goal.;Weight Management: Provide education and appropriate resources to help participant work on and attain dietary goals.    Admit Weight  218 lb (98.9 kg)    Goal Weight: Short Term  213 lb (96.6 kg)    Goal Weight: Long Term  200 lb (90.7 kg)    Expected Outcomes  Short Term: Continue to assess and modify interventions  until short term weight is achieved;Long Term: Adherence to nutrition and physical activity/exercise program aimed toward attainment of established weight goal;Weight Loss: Understanding of general recommendations for a balanced deficit meal plan, which promotes 1-2 lb weight loss per week and includes a negative energy balance of [phone] kcal/d;Understanding recommendations for meals to include 15-35% energy as protein, 25-35% energy from fat, 35-60% energy from carbohydrates, less than 200mg  of dietary cholesterol, 20-35 gm of total fiber daily;Understanding of distribution of calorie intake throughout the day with the consumption of 4-5 meals/snacks    Diabetes  Yes    Intervention  Provide education about signs/symptoms and action to take for hypo/hyperglycemia.;Provide education about proper nutrition, including hydration, and aerobic/resistive exercise prescription along with prescribed medications to achieve blood  glucose in normal ranges: Fasting glucose 65-99 mg/dL    Expected Outcomes  Short Term: Participant verbalizes understanding of the signs/symptoms and immediate care of hyper/hypoglycemia, proper foot care and importance of medication, aerobic/resistive exercise and nutrition plan for blood glucose control.;Long Term: Attainment of HbA1C < 7%.    Hypertension  Yes    Intervention  Provide education on lifestyle modifcations including regular physical activity/exercise, weight management, moderate sodium restriction and increased consumption of fresh fruit, vegetables, and low fat dairy, alcohol moderation, and smoking cessation.;Monitor prescription use compliance.    Expected Outcomes  Short Term: Continued assessment and intervention until BP is < 140/82mm HG in hypertensive participants. < 130/31mm HG in hypertensive participants with diabetes, heart failure or chronic kidney disease.;Long Term: Maintenance of blood pressure at goal levels.    Lipids  Yes    Intervention  Provide education and support for participant on nutrition & aerobic/resistive exercise along with prescribed medications to achieve LDL 70mg , HDL >40mg .    Expected Outcomes  Short Term: Participant states understanding of desired cholesterol values and is compliant with medications prescribed. Participant is following exercise prescription and nutrition guidelines.;Long Term: Cholesterol controlled with medications as prescribed, with individualized exercise RX and with personalized nutrition plan. Value goals: LDL < 70mg , HDL > 40 mg.       Tobacco Use Initial Evaluation: Social History   Tobacco Use  Smoking Status Former Smoker  Smokeless Tobacco Never Used    Exercise Goals and Review: Exercise Goals    Row Name 02/22/20 1200             Exercise Goals   Increase Physical Activity  Yes       Intervention  Provide advice, education, support and counseling about physical activity/exercise needs.;Develop an  individualized exercise prescription for aerobic and resistive training based on initial evaluation findings, risk stratification, comorbidities and participant's personal goals.       Expected Outcomes  Short Term: Attend rehab on a regular basis to increase amount of physical activity.;Long Term: Add in home exercise to make exercise part of routine and to increase amount of physical activity.;Long Term: Exercising regularly at least 3-5 days a week.       Increase Strength and Stamina  Yes       Intervention  Provide advice, education, support and counseling about physical activity/exercise needs.;Develop an individualized exercise prescription for aerobic and resistive training based on initial evaluation findings, risk stratification, comorbidities and participant's personal goals.       Expected Outcomes  Short Term: Increase workloads from initial exercise prescription for resistance, speed, and METs.;Short Term: Perform resistance training exercises routinely during rehab and add in resistance training at home;Long Term: Improve cardiorespiratory fitness, muscular endurance  and strength as measured by increased METs and functional capacity ( )       Able to understand and use rate of perceived exertion (RPE) scale  Yes       Intervention  Provide education and explanation on how to use RPE scale       Expected Outcomes  Short Term: Able to use RPE daily in rehab to express subjective intensity level;Long Term:  Able to use RPE to guide intensity level when exercising independently       Able to understand and use Dyspnea scale  Yes       Intervention  Provide education and explanation on how to use Dyspnea scale       Expected Outcomes  Long Term: Able to use Dyspnea scale to guide intensity level when exercising independently;Short Term: Able to use Dyspnea scale daily in rehab to express subjective sense of shortness of breath during exertion       Knowledge and understanding of Target Heart  Rate Range (THRR)  Yes       Intervention  Provide education and explanation of THRR including how the numbers were predicted and where they are located for reference       Expected Outcomes  Short Term: Able to state/look up THRR;Long Term: Able to use THRR to govern intensity when exercising independently;Short Term: Able to use daily as guideline for intensity in rehab       Able to check pulse independently  Yes       Intervention  Provide education and demonstration on how to check pulse in carotid and radial arteries.;Review the importance of being able to check your own pulse for safety during independent exercise       Expected Outcomes  Short Term: Able to explain why pulse checking is important during independent exercise;Long Term: Able to check pulse independently and accurately       Understanding of Exercise Prescription  Yes       Intervention  Provide education, explanation, and written materials on patient's individual exercise prescription       Expected Outcomes  Short Term: Able to explain program exercise prescription;Long Term: Able to explain home exercise prescription to exercise independently          Copy of goals given to participant.

## 2020-02-22 NOTE — Progress Notes (Signed)
Cardiac Individual Treatment Plan  Patient Details  Name: Joshua Haley MRN: [id number] Date of Birth: 04-Feb-1951 Referring Provider:     Cardiac Rehab from 02/22/2020 in [facility] Cardiac and Pulmonary Rehab  Referring Provider  Wyline Mood MD ([place])      Initial Encounter Date:    Cardiac Rehab from 02/22/2020 in [facility] Cardiac and Pulmonary Rehab  Date  02/22/20      Visit Diagnosis: S/P CABG x 2  Patient's Home Medications on Admission:  Current Outpatient Medications:  .  aspirin 81 MG chewable tablet, Chew 81 mg by mouth daily., Disp: , Rfl:  .  Cholecalciferol (VITAMIN D3) 125 MCG (5000 UT) CAPS, Take 5,000 Units by mouth daily., Disp: , Rfl:  .  Cyanocobalamin 1000 MCG/15ML LIQD, Take 1,000 mcg by mouth daily., Disp: , Rfl:  .  Ferrous Sulfate (IRON) 28 MG TABS, Take 1 tablet (28 mg total) by mouth daily. Can take any over the counter iron supplements after discharge., Disp: , Rfl:  .  glipiZIDE (GLUCOTROL) 5 MG tablet, Take by mouth., Disp: , Rfl:  .  glyBURIDE (DIABETA) 5 MG tablet, Hold while inpatient., Disp: , Rfl:  .  insulin glargine (LANTUS) 100 UNIT/ML injection, Inject 0.15 mLs (15 Units total) into the skin daily. (Patient not taking: Reported on 02/21/2020), Disp: , Rfl:  .  isosorbide dinitrate (ISORDIL) 10 MG tablet, Take by mouth., Disp: , Rfl:  .  ketoconazole (NIZORAL) 2 % cream, Apply 1 application topically 2 (two) times daily., Disp: , Rfl:  .  ketorolac (ACULAR) 0.4 % SOLN, Place 1 drop into the right eye 4 (four) times daily as needed (pain)., Disp: 1 Bottle, Rfl: 0 .  levothyroxine (SYNTHROID) 137 MCG tablet, Take 137 mcg by mouth daily before breakfast., Disp: , Rfl:  .  lisinopril (ZESTRIL) 20 MG tablet, Take 1 tablet (20 mg total) by mouth daily., Disp: , Rfl:  .  meloxicam (MOBIC) 15 MG tablet, Take 1 tablet (15 mg total) by mouth daily as needed for pain., Disp: , Rfl:  .  metFORMIN (GLUCOPHAGE) 1000 MG tablet, Hold while inpatient., Disp: , Rfl:  .   metoprolol succinate (TOPROL-XL) 50 MG 24 hr tablet, Take 50 mg by mouth daily. Take with or immediately following a meal., Disp: , Rfl:  .  metoprolol tartrate (LOPRESSOR) 25 MG tablet, Take by mouth., Disp: , Rfl:  .  pantoprazole (PROTONIX) 40 MG tablet, Take 40 mg by mouth daily., Disp: , Rfl:   Past Medical History: Past Medical History:  Diagnosis Date  . Diabetes mellitus without complication ([place])   . Kidney stone     Tobacco Use: Social History   Tobacco Use  Smoking Status Former Smoker  Smokeless Tobacco Never Used    Labs: Recent [employer] for ITP Cardiac and Pulmonary Rehab Latest Ref Rng & Units 01/20/2020 01/22/2020   Cholestrol 0 - 200 mg/dL - 246(H)   LDLCALC 0 - 99 mg/dL - 157(H)   HDL >40 mg/dL - 33(L)   Trlycerides <150 mg/dL - 279(H)   Hemoglobin A1c 4.8 - 5.6 % 7.9(H) -       Exercise Target Goals: Exercise Program Goal: Individual exercise prescription set using results from initial 6 min walk test and THRR while considering  patient's activity barriers and safety.   Exercise Prescription Goal: Initial exercise prescription builds to 30-45 minutes a day of aerobic activity, 2-3 days per week.  Home exercise guidelines will be given to  patient during program as part of exercise prescription that the participant will acknowledge.  Activity Barriers & Risk Stratification: Activity Barriers & Cardiac Risk Stratification - 02/22/20 1158      Activity Barriers & Cardiac Risk Stratification   Activity Barriers  Deconditioning;Muscular Weakness;Back Problems;Joint Problems;Other (comment);Incisional Pain    Comments  R sided back/hip pain especially in bed    Cardiac Risk Stratification  Moderate       6 Minute Walk: 6 Minute Walk    Row Name 02/22/20 1157         6 Minute Walk   Phase  Initial     Distance  1445 feet     Walk Time  6 minutes     # of Rest Breaks  0     MPH  2.74     METS  3.45     RPE  8     VO2 Peak   12.08     Symptoms  Yes (comment)     Comments  little burning from deeper breathing     Resting HR  88 bpm     Resting BP  108/64     Resting Oxygen Saturation   97 %     Exercise Oxygen Saturation  during 6 min walk  98 %     Max Ex. HR  113 bpm     Max Ex. BP  142/60     2 Minute Post BP  128/64        Oxygen Initial Assessment:   Oxygen Re-Evaluation:   Oxygen Discharge (Final Oxygen Re-Evaluation):   Initial Exercise Prescription: Initial Exercise Prescription - 02/22/20 1100      Date of Initial Exercise RX and Referring Provider   Date  02/22/20    Referring Provider  Wyline Mood MD ([place])      Treadmill   MPH  3    Grade  0.5    Minutes  15    METs  3.5      Elliptical   Level  1    Speed  3.2    Minutes  15      REL-XR   Level  3    Speed  50    Minutes  15    METs  3      T5 Nustep   Level  3    SPM  80    Minutes  15    METs  3      Prescription Details   Frequency (times per week)  3    Duration  Progress to 30 minutes of continuous aerobic without signs/symptoms of physical distress      Intensity   THRR 40-80% of Max Heartrate  114-139    Ratings of Perceived Exertion  11-13    Perceived Dyspnea  0-4      Progression   Progression  Continue to progress workloads to maintain intensity without signs/symptoms of physical distress.      Resistance Training   Training Prescription  Yes    Weight  3 lb    Reps  10-15       Perform Capillary Blood Glucose checks as needed.  Exercise Prescription Changes: Exercise Prescription Changes    Row Name 02/22/20 1100             Response to Exercise   Blood Pressure (Admit)  108/64       Blood Pressure (Exercise)  142/60  Blood Pressure (Exit)  128/64       Heart Rate (Admit)  88 bpm       Heart Rate (Exercise)  133 bpm       Heart Rate (Exit)  98 bpm       Oxygen Saturation (Admit)  97 %       Oxygen Saturation (Exercise)  98 %       Rating of Perceived Exertion  (Exercise)  8       Symptoms  burning in chest from deeper breathing       Comments  walk test results          Exercise Comments:   Exercise Goals and Review: Exercise Goals    Row Name 02/22/20 1200             Exercise Goals   Increase Physical Activity  Yes       Intervention  Provide advice, education, support and counseling about physical activity/exercise needs.;Develop an individualized exercise prescription for aerobic and resistive training based on initial evaluation findings, risk stratification, comorbidities and participant's personal goals.       Expected Outcomes  Short Term: Attend rehab on a regular basis to increase amount of physical activity.;Long Term: Add in home exercise to make exercise part of routine and to increase amount of physical activity.;Long Term: Exercising regularly at least 3-5 days a week.       Increase Strength and Stamina  Yes       Intervention  Provide advice, education, support and counseling about physical activity/exercise needs.;Develop an individualized exercise prescription for aerobic and resistive training based on initial evaluation findings, risk stratification, comorbidities and participant's personal goals.       Expected Outcomes  Short Term: Increase workloads from initial exercise prescription for resistance, speed, and METs.;Short Term: Perform resistance training exercises routinely during rehab and add in resistance training at home;Long Term: Improve cardiorespiratory fitness, muscular endurance and strength as measured by increased METs and functional capacity (6MWT)       Able to understand and use rate of perceived exertion (RPE) scale  Yes       Intervention  Provide education and explanation on how to use RPE scale       Expected Outcomes  Short Term: Able to use RPE daily in rehab to express subjective intensity level;Long Term:  Able to use RPE to guide intensity level when exercising independently       Able to  understand and use Dyspnea scale  Yes       Intervention  Provide education and explanation on how to use Dyspnea scale       Expected Outcomes  Long Term: Able to use Dyspnea scale to guide intensity level when exercising independently;Short Term: Able to use Dyspnea scale daily in rehab to express subjective sense of shortness of breath during exertion       Knowledge and understanding of Target Heart Rate Range (THRR)  Yes       Intervention  Provide education and explanation of THRR including how the numbers were predicted and where they are located for reference       Expected Outcomes  Short Term: Able to state/look up THRR;Long Term: Able to use THRR to govern intensity when exercising independently;Short Term: Able to use daily as guideline for intensity in rehab       Able to check pulse independently  Yes       Intervention  Provide education and  demonstration on how to check pulse in carotid and radial arteries.;Review the importance of being able to check your own pulse for safety during independent exercise       Expected Outcomes  Short Term: Able to explain why pulse checking is important during independent exercise;Long Term: Able to check pulse independently and accurately       Understanding of Exercise Prescription  Yes       Intervention  Provide education, explanation, and written materials on patient's individual exercise prescription       Expected Outcomes  Short Term: Able to explain program exercise prescription;Long Term: Able to explain home exercise prescription to exercise independently          Exercise Goals Re-Evaluation :   Discharge Exercise Prescription (Final Exercise Prescription Changes): Exercise Prescription Changes - 02/22/20 1100      Response to Exercise   Blood Pressure (Admit)  108/64    Blood Pressure (Exercise)  142/60    Blood Pressure (Exit)  128/64    Heart Rate (Admit)  88 bpm    Heart Rate (Exercise)  133 bpm    Heart Rate (Exit)  98 bpm     Oxygen Saturation (Admit)  97 %    Oxygen Saturation (Exercise)  98 %    Rating of Perceived Exertion (Exercise)  8    Symptoms  burning in chest from deeper breathing    Comments  walk test results       Nutrition:  Target Goals: Understanding of nutrition guidelines, daily intake of sodium '1500mg'$ , cholesterol '200mg'$ , calories 30% from fat and 7% or less from saturated fats, daily to have 5 or more servings of fruits and vegetables.  Biometrics: Pre Biometrics - 02/22/20 1201      Pre Biometrics   Height  6' 0.5" (1.842 m)    Weight  218 lb (98.9 kg)    BMI (Calculated)  29.14    Single Leg Stand  30 seconds        Nutrition Therapy Plan and Nutrition Goals:   Nutrition Assessments: Nutrition Assessments - 02/22/20 1204      MEDFICTS Scores   Pre Score  14       Nutrition Goals Re-Evaluation:   Nutrition Goals Discharge (Final Nutrition Goals Re-Evaluation):   Psychosocial: Target Goals: Acknowledge presence or absence of significant depression and/or stress, maximize coping skills, provide positive support system. Participant is able to verbalize types and ability to use techniques and skills needed for reducing stress and depression.   Initial Review & Psychosocial Screening: Initial Psych Review & Screening - 02/21/20 4982      Initial Review   Current issues with  Current Sleep Concerns;Current Stress Concerns    Source of Stress Concerns  Chronic Illness;Financial    Comments  wears CPAP but gets at least 6 hours of sleep since using it, health some financial stressors but it is okay right now      [place]?  Yes   wife, Church family and pastor, prayers   Comments  Relies strongly on his [place], prayers, and church family      Barriers   Psychosocial barriers to participate in program  The patient should benefit from training in stress management and relaxation.;Psychosocial barriers identified (see note)       Screening Interventions   Interventions  Encouraged to exercise;Provide feedback about the scores to participant;To provide support and resources with identified psychosocial needs  Expected Outcomes  Short Term goal: Utilizing psychosocial counselor, staff and physician to assist with identification of specific Stressors or current issues interfering with healing process. Setting desired goal for each stressor or current issue identified.;Long Term Goal: Stressors or current issues are controlled or eliminated.;Short Term goal: Identification and review with participant of any Quality of Life or Depression concerns found by scoring the questionnaire.;Long Term goal: The participant improves quality of Life and PHQ9 Scores as seen by post scores and/or verbalization of changes       Quality of Life Scores:  Quality of Life - 02/22/20 1204      Quality of Life   Select  Quality of Life      Quality of Life Scores   Health/Function Pre  27.2 %    Socioeconomic Pre  26.75 %    Psych/Spiritual Pre  28.21 %    Family Pre  25.2 %    GLOBAL Pre  27.01 %      Scores of 19 and below usually indicate a poorer quality of life in these areas.  A difference of  2-3 points is a clinically meaningful difference.  A difference of 2-3 points in the total score of the Quality of Life Index has been associated with significant improvement in overall quality of life, self-image, physical symptoms, and general health in studies assessing change in quality of life.  PHQ-9: Recent Review Flowsheet Data    Depression screen [facility] 2/9 02/22/2020   Decreased Interest 0   Down, Depressed, Hopeless 0   PHQ - 2 Score 0   Altered sleeping 0   Tired, decreased energy 0   Change in appetite 0   Feeling bad or failure about yourself  0   Trouble concentrating 0   Moving slowly or fidgety/restless 0   PHQ-9 Score 0   Difficult doing work/chores Not difficult at all     Interpretation of Total Score  Total  Score Depression Severity:  1-4 = Minimal depression, 5-9 = Mild depression, 10-14 = Moderate depression, 15-19 = Moderately severe depression, 20-27 = Severe depression   Psychosocial Evaluation and Intervention: Psychosocial Evaluation - 02/21/20 0944      Psychosocial Evaluation & Interventions   Interventions  Stress management education;Encouraged to exercise with the program and follow exercise prescription    Comments  Joshua Haley is coming into rehab after CABG x2 and is being covered by the [place].  He gets most of his care and medical needs through the [place].  He quit smoking in 2011 and doing well with it.  He has a history of diabetes and is under pretty good control.   No history of depression or anxiety.  He has a stronger support system in his wife and church family.  They have had some financial scares during the past year but have gotten it back under control.  He would like to return to work in some compacity but not sure what he wants to do.  He wants to get healthier and work on his diet so that he is able to work again.    Expected Outcomes  Short: Attend rehab regularly to build up stamina.  Long: Build up stamina to be able to return to work.    Continue Psychosocial Services   Follow up required by staff       Psychosocial Re-Evaluation:   Psychosocial Discharge (Final Psychosocial Re-Evaluation):   Vocational Rehabilitation: Provide vocational rehab assistance to qualifying candidates.   Vocational Rehab Evaluation &  Intervention: Vocational Rehab - 02/21/20 7425      Initial Vocational Rehab Evaluation & Intervention   Assessment shows need for Vocational Rehabilitation  --   not sure yet      Education: Education Goals: Education classes will be provided on a variety of topics geared toward better understanding of heart health and risk factor modification. Participant will state understanding/return demonstration of topics presented as noted by education test  scores.  Learning Barriers/Preferences: Learning Barriers/Preferences - 02/21/20 9563      Learning Barriers/Preferences   Learning Barriers  Sight   glasses   Learning Preferences  Skilled Demonstration;Written Material       Education Topics:  AED/CPR: - Group verbal and written instruction with the use of models to demonstrate the basic use of the AED with the basic ABC's of resuscitation.   General Nutrition Guidelines/Fats and Fiber: -Group instruction provided by verbal, written material, models and posters to present the general guidelines for heart healthy nutrition. Gives an explanation and review of dietary fats and fiber.   Controlling Sodium/Reading Food Labels: -Group verbal and written material supporting the discussion of sodium use in heart healthy nutrition. Review and explanation with models, verbal and written materials for utilization of the food label.   Exercise Physiology & General Exercise Guidelines: - Group verbal and written instruction with models to review the exercise physiology of the cardiovascular system and associated critical values. Provides general exercise guidelines with specific guidelines to those with heart or lung disease.    Aerobic Exercise & Resistance Training: - Gives group verbal and written instruction on the various components of exercise. Focuses on aerobic and resistive training programs and the benefits of this training and how to safely progress through these programs..   Flexibility, Balance, Mind/Body Relaxation: Provides group verbal/written instruction on the benefits of flexibility and balance training, including mind/body exercise modes such as yoga, pilates and tai chi.  Demonstration and skill practice provided.   Stress and Anxiety: - Provides group verbal and written instruction about the health risks of elevated stress and causes of high stress.  Discuss the correlation between heart/lung disease and anxiety and  treatment options. Review healthy ways to manage with stress and anxiety.   Depression: - Provides group verbal and written instruction on the correlation between heart/lung disease and depressed mood, treatment options, and the stigmas associated with seeking treatment.   Anatomy & Physiology of the Heart: - Group verbal and written instruction and models provide basic cardiac anatomy and physiology, with the coronary electrical and arterial systems. Review of Valvular disease and Heart Failure   Cardiac Procedures: - Group verbal and written instruction to review commonly prescribed medications for heart disease. Reviews the medication, class of the drug, and side effects. Includes the steps to properly store meds and maintain the prescription regimen. (beta blockers and nitrates)   Cardiac Medications I: - Group verbal and written instruction to review commonly prescribed medications for heart disease. Reviews the medication, class of the drug, and side effects. Includes the steps to properly store meds and maintain the prescription regimen.   Cardiac Medications II: -Group verbal and written instruction to review commonly prescribed medications for heart disease. Reviews the medication, class of the drug, and side effects. (all other drug classes)    Go Sex-Intimacy & Heart Disease, Get SMART - Goal Setting: - Group verbal and written instruction through game format to discuss heart disease and the return to sexual intimacy. Provides group verbal and written material  to discuss and apply goal setting through the application of the S.M.A.R.T. Method.   Other Matters of the Heart: - Provides group verbal, written materials and models to describe Stable Angina and Peripheral Artery. Includes description of the disease process and treatment options available to the cardiac patient.   Exercise & Equipment Safety: - Individual verbal instruction and demonstration of equipment use and  safety with use of the equipment.   Cardiac Rehab from 02/22/2020 in [facility] Cardiac and Pulmonary Rehab  Date  02/22/20  Educator  [facility]  Instruction Review Code  1- Verbalizes Understanding      Infection Prevention: - Provides verbal and written material to individual with discussion of infection control including proper hand washing and proper equipment cleaning during exercise session.   Cardiac Rehab from 02/22/2020 in [facility] Cardiac and Pulmonary Rehab  Date  02/22/20  Educator  [facility]  Instruction Review Code  1- Verbalizes Understanding      Falls Prevention: - Provides verbal and written material to individual with discussion of falls prevention and safety.   Cardiac Rehab from 02/22/2020 in [facility] Cardiac and Pulmonary Rehab  Date  02/22/20  Educator  [facility]  Instruction Review Code  1- Verbalizes Understanding      Diabetes: - Individual verbal and written instruction to review signs/symptoms of diabetes, desired ranges of glucose level fasting, after meals and with exercise. Acknowledge that pre and post exercise glucose checks will be done for 3 sessions at entry of program.   Cardiac Rehab from 02/22/2020 in [facility] Cardiac and Pulmonary Rehab  Date  02/21/20  Educator  [facility]  Instruction Review Code  1- Verbalizes Understanding      Know Your Numbers and Risk Factors: -Group verbal and written instruction about important numbers in your health.  Discussion of what are risk factors and how they play a role in the disease process.  Review of Cholesterol, Blood Pressure, Diabetes, and BMI and the role they play in your overall health.   Sleep Hygiene: -Provides group verbal and written instruction about how sleep can affect your health.  Define sleep hygiene, discuss sleep cycles and impact of sleep habits. Review good sleep hygiene tips.    Other: -Provides group and verbal instruction on various topics (see comments)   Knowledge Questionnaire Score: Knowledge Questionnaire Score -  02/22/20 1204      Knowledge Questionnaire Score   Pre Score  24/26 Education Focus: Nutrition, Depression       Core Components/Risk Factors/Patient Goals at Admission: Personal Goals and Risk Factors at Admission - 02/22/20 1205      Core Components/Risk Factors/Patient Goals on Admission    Weight Management  Yes;Weight Loss    Intervention  Weight Management: Develop a combined nutrition and exercise program designed to reach desired caloric intake, while maintaining appropriate intake of nutrient and fiber, sodium and fats, and appropriate energy expenditure required for the weight goal.;Weight Management: Provide education and appropriate resources to help participant work on and attain dietary goals.    Admit Weight  218 lb (98.9 kg)    Goal Weight: Short Term  213 lb (96.6 kg)    Goal Weight: Long Term  200 lb (90.7 kg)    Expected Outcomes  Short Term: Continue to assess and modify interventions until short term weight is achieved;Long Term: Adherence to nutrition and physical activity/exercise program aimed toward attainment of established weight goal;Weight Loss: Understanding of general recommendations for a balanced deficit meal plan, which promotes 1-2 lb weight loss  per week and includes a negative energy balance of [id number] kcal/d;Understanding recommendations for meals to include 15-35% energy as protein, 25-35% energy from fat, 35-60% energy from carbohydrates, less than '200mg'$  of dietary cholesterol, 20-35 gm of total fiber daily;Understanding of distribution of calorie intake throughout the day with the consumption of 4-5 meals/snacks    Diabetes  Yes    Intervention  Provide education about signs/symptoms and action to take for hypo/hyperglycemia.;Provide education about proper nutrition, including hydration, and aerobic/resistive exercise prescription along with prescribed medications to achieve blood glucose in normal ranges: Fasting glucose 65-99 mg/dL    Expected Outcomes   Short Term: Participant verbalizes understanding of the signs/symptoms and immediate care of hyper/hypoglycemia, proper foot care and importance of medication, aerobic/resistive exercise and nutrition plan for blood glucose control.;Long Term: Attainment of HbA1C < 7%.    Hypertension  Yes    Intervention  Provide education on lifestyle modifcations including regular physical activity/exercise, weight management, moderate sodium restriction and increased consumption of fresh fruit, vegetables, and low fat dairy, alcohol moderation, and smoking cessation.;Monitor prescription use compliance.    Expected Outcomes  Short Term: Continued assessment and intervention until BP is < 140/20m HG in hypertensive participants. < 130/859mHG in hypertensive participants with diabetes, heart failure or chronic kidney disease.;Long Term: Maintenance of blood pressure at goal levels.    Lipids  Yes    Intervention  Provide education and support for participant on nutrition & aerobic/resistive exercise along with prescribed medications to achieve LDL '70mg'$ , HDL >'40mg'$ .    Expected Outcomes  Short Term: Participant states understanding of desired cholesterol values and is compliant with medications prescribed. Participant is following exercise prescription and nutrition guidelines.;Long Term: Cholesterol controlled with medications as prescribed, with individualized exercise RX and with personalized nutrition plan. Value goals: LDL < '70mg'$ , HDL > 40 mg.       Core Components/Risk Factors/Patient Goals Review:    Core Components/Risk Factors/Patient Goals at Discharge (Final Review):    ITP Comments: ITP Comments    Row Name 02/21/20 0957 02/22/20 1154         ITP Comments  Completed virtual orientation today.  EP evaluation is scheduled for Thursday 2/25  at 930am.  Documentation for diagnosis can be found in CE encounter 01/26/20.  Completed 6MWT and gym orientation.  Initial ITP created and sent for review to  Dr. MaEmily FilbertMedical Director.         Comments: Initial ITP

## 2020-02-27 ENCOUNTER — Encounter: Payer: No Typology Code available for payment source | Attending: Internal Medicine

## 2020-02-27 ENCOUNTER — Other Ambulatory Visit: Payer: Self-pay

## 2020-02-27 DIAGNOSIS — Z951 Presence of aortocoronary bypass graft: Secondary | ICD-10-CM | POA: Insufficient documentation

## 2020-02-27 NOTE — Progress Notes (Signed)
Completed initial RD Eval 

## 2020-02-28 ENCOUNTER — Encounter: Payer: No Typology Code available for payment source | Admitting: *Deleted

## 2020-02-28 DIAGNOSIS — Z951 Presence of aortocoronary bypass graft: Secondary | ICD-10-CM | POA: Diagnosis not present

## 2020-02-28 LAB — GLUCOSE, CAPILLARY
Glucose-Capillary: 222 mg/dL — ABNORMAL HIGH (ref 70–99)
Glucose-Capillary: 160 mg/dL — ABNORMAL HIGH (ref 70–99)

## 2020-02-28 NOTE — Progress Notes (Signed)
Daily Session Note  Patient Details  Name: Joshua Haley MRN: [id number] Date of Birth: Dec 20, 1951 Referring Provider:     Cardiac Rehab from 02/22/2020 in [facility] Cardiac and Pulmonary Rehab  Referring Provider  Wyline Mood MD ([place])      Encounter Date: 02/28/2020  Check In: Session Check In - 02/28/20 1030      Check-In   Supervising physician immediately available to respond to emergencies  See telemetry face sheet for immediately available ER MD    Location  [facility]-Cardiac & Pulmonary Rehab    Staff Present  Renita Papa, RN BSN;Joseph Hood RCP,RRT,BSRT;Jeanna Durrell BS, Exercise Physiologist    Virtual Visit  No    Medication changes reported      No    Fall or balance concerns reported     No    Warm-up and Cool-down  Performed on first and last piece of equipment    Resistance Training Performed  Yes    VAD Patient?  No    PAD/SET Patient?  No      Pain Assessment   Currently in Pain?  No/denies          Social History   Tobacco Use  Smoking Status Former Smoker  Smokeless Tobacco Never Used    Goals Met:  Independence with exercise equipment Exercise tolerated well No report of cardiac concerns or symptoms Strength training completed today  Goals Unmet:  Not Applicable  Comments: First full day of exercise!  Patient was oriented to gym and equipment including functions, settings, policies, and procedures.  Patient's individual exercise prescription and treatment plan were reviewed.  All starting workloads were established based on the results of the 6 minute walk test done at initial orientation visit.  The plan for exercise progression was also introduced and progression will be customized based on patient's performance and goals.    Dr. Emily Filbert is Medical Director for [place] and [facility].

## 2020-03-01 ENCOUNTER — Other Ambulatory Visit: Payer: Self-pay

## 2020-03-01 ENCOUNTER — Encounter: Payer: No Typology Code available for payment source | Admitting: *Deleted

## 2020-03-01 DIAGNOSIS — Z951 Presence of aortocoronary bypass graft: Secondary | ICD-10-CM | POA: Diagnosis not present

## 2020-03-01 LAB — GLUCOSE, CAPILLARY
Glucose-Capillary: 201 mg/dL — ABNORMAL HIGH (ref 70–99)
Glucose-Capillary: 170 mg/dL — ABNORMAL HIGH (ref 70–99)

## 2020-03-01 NOTE — Progress Notes (Signed)
Daily Session Note  Patient Details  Name: Joshua Haley MRN: [id number] Date of Birth: 22-Mar-1951 Referring Provider:     Cardiac Rehab from 02/22/2020 in [facility] Cardiac and Pulmonary Rehab  Referring Provider  Wyline Mood MD ([place])      Encounter Date: 03/01/2020  Check In: Session Check In - 03/01/20 1016      Check-In   Supervising physician immediately available to respond to emergencies  See telemetry face sheet for immediately available ER MD    Location  [facility]-Cardiac & Pulmonary Rehab    Staff Present  Renita Papa, RN BSN;Joseph [address] [place], [place], [place], CCRP, [place], [place], ACSM CEP, Exercise Physiologist    Virtual Visit  No    Medication changes reported      No    Fall or balance concerns reported     No    Warm-up and Cool-down  Performed on first and last piece of equipment    Resistance Training Performed  Yes    VAD Patient?  No    PAD/SET Patient?  No      Pain Assessment   Currently in Pain?  No/denies          Social History   Tobacco Use  Smoking Status Former Smoker  Smokeless Tobacco Never Used    Goals Met:  Independence with exercise equipment Exercise tolerated well No report of cardiac concerns or symptoms Strength training completed today  Goals Unmet:  Not Applicable  Comments: Pt able to follow exercise prescription today without complaint.  Will continue to monitor for progression.    Dr. Emily Filbert is Medical Director for [place] and [facility].

## 2020-03-04 ENCOUNTER — Other Ambulatory Visit: Payer: Self-pay

## 2020-03-04 ENCOUNTER — Encounter: Payer: No Typology Code available for payment source | Admitting: *Deleted

## 2020-03-04 DIAGNOSIS — Z951 Presence of aortocoronary bypass graft: Secondary | ICD-10-CM | POA: Diagnosis not present

## 2020-03-04 LAB — GLUCOSE, CAPILLARY
Glucose-Capillary: 199 mg/dL — ABNORMAL HIGH (ref 70–99)
Glucose-Capillary: 148 mg/dL — ABNORMAL HIGH (ref 70–99)

## 2020-03-04 NOTE — Progress Notes (Signed)
Daily Session Note  Patient Details  Name: NEWMAN WAREN MRN: [id number] Date of Birth: [birth year] Referring Provider:     Cardiac Rehab from 02/22/2020 in [facility] Cardiac and Pulmonary Rehab  Referring Provider  Wyline Mood MD ([place])      Encounter Date: 03/04/2020  Check In: Session Check In - 03/04/20 0948      Check-In   Supervising physician immediately available to respond to emergencies  See telemetry face sheet for immediately available ER MD    Location  [facility]Cardiac & Pulmonary Rehab    Staff Present  Renita Papa, RN BSN;Joseph [address] [place], [place], ACSM CEP, Exercise Physiologist    Virtual Visit  No    Medication changes reported      No    Fall or balance concerns reported     No    Warm-up and Cool-down  Performed on first and last piece of equipment    Resistance Training Performed  Yes    VAD Patient?  No    PAD/SET Patient?  No      Pain Assessment   Currently in Pain?  No/denies          Social History   Tobacco Use  Smoking Status Former Smoker  Smokeless Tobacco Never Used    Goals Met:  Independence with exercise equipment Exercise tolerated well No report of cardiac concerns or symptoms Strength training completed today  Goals Unmet:  Not Applicable  Comments: Pt able to follow exercise prescription today without complaint.  Will continue to monitor for progression.    Dr. Emily Filbert is Medical Director for [place] and [facility].

## 2020-03-06 ENCOUNTER — Encounter: Payer: No Typology Code available for payment source | Admitting: *Deleted

## 2020-03-06 ENCOUNTER — Other Ambulatory Visit: Payer: Self-pay

## 2020-03-06 DIAGNOSIS — Z951 Presence of aortocoronary bypass graft: Secondary | ICD-10-CM | POA: Diagnosis not present

## 2020-03-06 NOTE — Progress Notes (Signed)
Daily Session Note  Patient Details  Name: Joshua Haley MRN: [id number] Date of Birth: Oct 14, 1951 Referring Provider:     Cardiac Rehab from 02/22/2020 in [facility] Cardiac and Pulmonary Rehab  Referring Provider  Wyline Mood MD ([place])      Encounter Date: 03/06/2020  Check In: Session Check In - 03/06/20 0954      Check-In   Supervising physician immediately available to respond to emergencies  See telemetry face sheet for immediately available ER MD    Location  [facility]-Cardiac & Pulmonary Rehab    Staff Present  Heath Lark, RN, BSN, CCRP;Meredith Sherryll Burger, RN BSN;Jeanna Durrell BS, Exercise Physiologist;Joseph Hood RCP,RRT,BSRT    Virtual Visit  No    Medication changes reported      No    Fall or balance concerns reported     No    Warm-up and Cool-down  Performed on first and last piece of equipment    Resistance Training Performed  Yes    VAD Patient?  No    PAD/SET Patient?  No      Pain Assessment   Currently in Pain?  No/denies          Social History   Tobacco Use  Smoking Status Former Smoker  Smokeless Tobacco Never Used    Goals Met:  Independence with exercise equipment Exercise tolerated well No report of cardiac concerns or symptoms  Goals Unmet:  Not Applicable  Comments: Pt able to follow exercise prescription today without complaint.  Will continue to monitor for progression.    Dr. Emily Filbert is Medical Director for [place] and [facility].

## 2020-03-08 ENCOUNTER — Other Ambulatory Visit: Payer: Self-pay

## 2020-03-08 ENCOUNTER — Encounter: Payer: No Typology Code available for payment source | Admitting: *Deleted

## 2020-03-08 DIAGNOSIS — Z951 Presence of aortocoronary bypass graft: Secondary | ICD-10-CM

## 2020-03-08 NOTE — Progress Notes (Signed)
Daily Session Note  Patient Details  Name: Joshua Haley MRN: [id number] Date of Birth: 1951-02-14 Referring Provider:     Cardiac Rehab from 02/22/2020 in [facility] Cardiac and Pulmonary Rehab  Referring Provider  Wyline Mood MD ([place])      Encounter Date: 03/08/2020  Check In: Session Check In - 03/08/20 0943      Check-In   Supervising physician immediately available to respond to emergencies  See telemetry face sheet for immediately available ER MD    Location  [facility]Cardiac & Pulmonary Rehab    Staff Present  Heath Lark, RN, BSN, CCRP;Jessica [place], MA, RCEP, CCRP, CCET;Joseph [place] RCP,RRT,BSRT    Virtual Visit  No    Medication changes reported      No    Fall or balance concerns reported     No    Warm-up and Cool-down  Performed on first and last piece of equipment    Resistance Training Performed  Yes    VAD Patient?  No    PAD/SET Patient?  No      Pain Assessment   Currently in Pain?  No/denies          Social History   Tobacco Use  Smoking Status Former Smoker  Smokeless Tobacco Never Used    Goals Met:  Independence with exercise equipment Exercise tolerated well No report of cardiac concerns or symptoms  Goals Unmet:  Not Applicable  Comments: Pt able to follow exercise prescription today without complaint.  Will continue to monitor for progression.    Dr. Emily Filbert is Medical Director for [place] and [facility].

## 2020-03-08 NOTE — Progress Notes (Signed)
Reviewed home exercise with pt today.  Pt plans to walk and use staff videos for exercise.  Reviewed THR, pulse, RPE, sign and symptoms, and when to call 911 or MD.  Also discussed weather considerations and indoor options.  Pt voiced understanding.  He hopes to join [employer] after graduation.

## 2020-03-11 ENCOUNTER — Encounter: Payer: No Typology Code available for payment source | Admitting: *Deleted

## 2020-03-11 ENCOUNTER — Other Ambulatory Visit: Payer: Self-pay

## 2020-03-11 DIAGNOSIS — Z951 Presence of aortocoronary bypass graft: Secondary | ICD-10-CM | POA: Diagnosis not present

## 2020-03-11 NOTE — Progress Notes (Signed)
Daily Session Note  Patient Details  Name: Joshua Haley MRN: [id number] Date of Birth: 02/29/1951 Referring Provider:     Cardiac Rehab from 02/22/2020 in [facility] Cardiac and Pulmonary Rehab  Referring Provider  [place], Athena [place] ([place])      Encounter Date: 03/11/2020  Check In: Session Check In - 03/11/20 0958      Check-In   Supervising physician immediately available to respond to emergencies  See telemetry face sheet for immediately available ER MD    Location  [facility]Card[facility] Pul[facility] Rehab    Staff Present  Susanne Bice, RN, BSN, CCRP;Meredith Craven, RN BSN;Joseph Hood RCP,RRT,BSRT;Kelly Hayes, BS, ACSM CEP, Exercise Physiologist    Virtual Visit  No    Medication changes reported      No    Fall or balance concerns reported     No    Warm-up and Cool-down  Performed on first and last piece of equipment    Resistance Training Performed  Yes    VAD Patient?  No    PAD/SET Patient?  No      Pain Assessment   Currently in Pain?  No/denies          Social History   Tobacco Use  Smoking Status Former Smoker  Smokeless Tobacco Never Used    Goals Met:  Independence with exercise equipment Exercise tolerated well No report of cardiac concerns or symptoms  Goals Unmet:  Not Applicable  Comments: Pt able to follow exercise prescription today without complaint.  Will continue to monitor for progression.    Dr. Mark Miller is Medical Director for [facility] and [facility]. 

## 2020-03-15 ENCOUNTER — Other Ambulatory Visit: Payer: Self-pay

## 2020-03-15 ENCOUNTER — Encounter: Payer: No Typology Code available for payment source | Admitting: *Deleted

## 2020-03-15 DIAGNOSIS — Z951 Presence of aortocoronary bypass graft: Secondary | ICD-10-CM | POA: Diagnosis not present

## 2020-03-15 NOTE — Progress Notes (Signed)
Daily Session Note  Patient Details  Name: Joshua Haley MRN: [id number] Date of Birth: 11/10/1951 Referring Provider:     Cardiac Rehab from 02/22/2020 in [facility] Cardiac and Pulmonary Rehab  Referring Provider  Wyline Mood MD ([place])      Encounter Date: 03/15/2020  Check In: Session Check In - 03/15/20 1015      Check-In   Supervising physician immediately available to respond to emergencies  See telemetry face sheet for immediately available ER MD    Location  [facility]Cardiac & Pulmonary Rehab    Staff Present  Nyoka Cowden, RN, BSN, MA;Meredith Sherryll Burger, RN BSN;Jessica Luan Pulling, MA, RCEP, CCRP, CCET;Amanda Sommer, BA, ACSM CEP, Exercise Physiologist;Joseph Tessie Fass RCP,RRT,BSRT    Medication changes reported      No    Warm-up and Cool-down  Performed on first and last piece of equipment    Resistance Training Performed  Yes    VAD Patient?  No    PAD/SET Patient?  No      Pain Assessment   Currently in Pain?  No/denies    Multiple Pain Sites  No          Social History   Tobacco Use  Smoking Status Former Smoker  Smokeless Tobacco Never Used    Goals Met:  Independence with exercise equipment Exercise tolerated well No report of cardiac concerns or symptoms Strength training completed today  Goals Unmet:  Not Applicable  Comments: Pt able to follow exercise prescription today without complaint.  Will continue to monitor for progression.   Dr. Emily Filbert is Medical Director for [place] and [facility].

## 2020-03-18 ENCOUNTER — Other Ambulatory Visit: Payer: Self-pay

## 2020-03-18 ENCOUNTER — Encounter: Payer: No Typology Code available for payment source | Admitting: *Deleted

## 2020-03-18 DIAGNOSIS — Z951 Presence of aortocoronary bypass graft: Secondary | ICD-10-CM | POA: Diagnosis not present

## 2020-03-18 NOTE — Progress Notes (Signed)
Daily Session Note  Patient Details  Name: Joshua Haley MRN: [id number] Date of Birth: 1951/08/06 Referring Provider:     Cardiac Rehab from 02/22/2020 in [facility] Cardiac and Pulmonary Rehab  Referring Provider  Wyline Mood MD ([place])      Encounter Date: 03/18/2020  Check In: Session Check In - 03/18/20 0950      Check-In   Supervising physician immediately available to respond to emergencies  See telemetry face sheet for immediately available ER MD    Location  [facility]-Cardiac & Pulmonary Rehab    Staff Present  Heath Lark, RN, BSN, CCRP;Meredith Sherryll Burger, RN BSN;Joseph Tedd [place], [place], ACSM CEP, Exercise Physiologist    Virtual Visit  No    Medication changes reported      No    Fall or balance concerns reported     No    Warm-up and Cool-down  Performed on first and last piece of equipment    Resistance Training Performed  Yes    VAD Patient?  No    PAD/SET Patient?  No      Pain Assessment   Currently in Pain?  No/denies          Social History   Tobacco Use  Smoking Status Former Smoker  Smokeless Tobacco Never Used    Goals Met:  Independence with exercise equipment Exercise tolerated well No report of cardiac concerns or symptoms  Goals Unmet:  Not Applicable  Comments: Pt able to follow exercise prescription today without complaint.  Will continue to monitor for progression.    Dr. Emily Filbert is Medical Director for [place] and [facility].

## 2020-03-20 ENCOUNTER — Other Ambulatory Visit: Payer: Self-pay

## 2020-03-20 ENCOUNTER — Encounter: Payer: Self-pay | Admitting: *Deleted

## 2020-03-20 ENCOUNTER — Encounter: Payer: No Typology Code available for payment source | Admitting: *Deleted

## 2020-03-20 DIAGNOSIS — Z951 Presence of aortocoronary bypass graft: Secondary | ICD-10-CM | POA: Diagnosis not present

## 2020-03-20 NOTE — Progress Notes (Signed)
Daily Session Note  Patient Details  Name: Joshua Haley MRN: [id number] Date of Birth: 17-May-1951 Referring Provider:     Cardiac Rehab from 02/22/2020 in [facility] Cardiac and Pulmonary Rehab  Referring Provider  Wyline Mood MD ([place])      Encounter Date: 03/20/2020  Check In: Session Check In - 03/20/20 1015      Check-In   Supervising physician immediately available to respond to emergencies  See telemetry face sheet for immediately available ER MD    Location  [facility]-Cardiac & Pulmonary Rehab    Staff Present  Renita Papa, RN BSN;Joseph Hood RCP,RRT,BSRT;Amanda Oletta Darter, [place], ACSM CEP, Exercise Physiologist    Virtual Visit  No    Medication changes reported      No    Fall or balance concerns reported     No    Warm-up and Cool-down  Performed on first and last piece of equipment    Resistance Training Performed  Yes    VAD Patient?  No    PAD/SET Patient?  No      Pain Assessment   Currently in Pain?  No/denies          Social History   Tobacco Use  Smoking Status Former Smoker  Smokeless Tobacco Never Used    Goals Met:  Independence with exercise equipment Exercise tolerated well No report of cardiac concerns or symptoms Strength training completed today  Goals Unmet:  Not Applicable  Comments: Pt able to follow exercise prescription today without complaint.  Will continue to monitor for progression.    Dr. Emily Filbert is Medical Director for [place] and [facility].

## 2020-03-20 NOTE — Progress Notes (Signed)
Cardiac Individual Treatment Plan  Patient Details  Name: Joshua Haley MRN: [id number] Date of Birth: 04-Feb-1951 Referring Provider:     Cardiac Rehab from 02/22/2020 in [facility] Cardiac and Pulmonary Rehab  Referring Provider  Wyline Mood MD ([place])      Initial Encounter Date:    Cardiac Rehab from 02/22/2020 in [facility] Cardiac and Pulmonary Rehab  Date  02/22/20      Visit Diagnosis: S/P CABG x 2  Patient's Home Medications on Admission:  Current Outpatient Medications:  .  aspirin 81 MG chewable tablet, Chew 81 mg by mouth daily., Disp: , Rfl:  .  Cholecalciferol (VITAMIN D3) 125 MCG (5000 UT) CAPS, Take 5,000 Units by mouth daily., Disp: , Rfl:  .  Cyanocobalamin 1000 MCG/15ML LIQD, Take 1,000 mcg by mouth daily., Disp: , Rfl:  .  Ferrous Sulfate (IRON) 28 MG TABS, Take 1 tablet (28 mg total) by mouth daily. Can take any over the counter iron supplements after discharge., Disp: , Rfl:  .  glipiZIDE (GLUCOTROL) 5 MG tablet, Take by mouth., Disp: , Rfl:  .  glyBURIDE (DIABETA) 5 MG tablet, Hold while inpatient., Disp: , Rfl:  .  insulin glargine (LANTUS) 100 UNIT/ML injection, Inject 0.15 mLs (15 Units total) into the skin daily. (Patient not taking: Reported on 02/21/2020), Disp: , Rfl:  .  isosorbide dinitrate (ISORDIL) 10 MG tablet, Take by mouth., Disp: , Rfl:  .  ketoconazole (NIZORAL) 2 % cream, Apply 1 application topically 2 (two) times daily., Disp: , Rfl:  .  ketorolac (ACULAR) 0.4 % SOLN, Place 1 drop into the right eye 4 (four) times daily as needed (pain)., Disp: 1 Bottle, Rfl: 0 .  levothyroxine (SYNTHROID) 137 MCG tablet, Take 137 mcg by mouth daily before breakfast., Disp: , Rfl:  .  lisinopril (ZESTRIL) 20 MG tablet, Take 1 tablet (20 mg total) by mouth daily., Disp: , Rfl:  .  meloxicam (MOBIC) 15 MG tablet, Take 1 tablet (15 mg total) by mouth daily as needed for pain., Disp: , Rfl:  .  metFORMIN (GLUCOPHAGE) 1000 MG tablet, Hold while inpatient., Disp: , Rfl:  .   metoprolol succinate (TOPROL-XL) 50 MG 24 hr tablet, Take 50 mg by mouth daily. Take with or immediately following a meal., Disp: , Rfl:  .  metoprolol tartrate (LOPRESSOR) 25 MG tablet, Take by mouth., Disp: , Rfl:  .  pantoprazole (PROTONIX) 40 MG tablet, Take 40 mg by mouth daily., Disp: , Rfl:   Past Medical History: Past Medical History:  Diagnosis Date  . Diabetes mellitus without complication ([place])   . Kidney stone     Tobacco Use: Social History   Tobacco Use  Smoking Status Former Smoker  Smokeless Tobacco Never Used    Labs: Recent [employer] for ITP Cardiac and Pulmonary Rehab Latest Ref Rng & Units 01/20/2020 01/22/2020   Cholestrol 0 - 200 mg/dL - 246(H)   LDLCALC 0 - 99 mg/dL - 157(H)   HDL >40 mg/dL - 33(L)   Trlycerides <150 mg/dL - 279(H)   Hemoglobin A1c 4.8 - 5.6 % 7.9(H) -       Exercise Target Goals: Exercise Program Goal: Individual exercise prescription set using results from initial 6 min walk test and THRR while considering  patient's activity barriers and safety.   Exercise Prescription Goal: Initial exercise prescription builds to 30-45 minutes a day of aerobic activity, 2-3 days per week.  Home exercise guidelines will be given to  patient during program as part of exercise prescription that the participant will acknowledge.  Activity Barriers & Risk Stratification: Activity Barriers & Cardiac Risk Stratification - 02/22/20 1158      Activity Barriers & Cardiac Risk Stratification   Activity Barriers  Deconditioning;Muscular Weakness;Back Problems;Joint Problems;Other (comment);Incisional Pain    Comments  R sided back/hip pain especially in bed    Cardiac Risk Stratification  Moderate       6 Minute Walk: 6 Minute Walk    Row Name 02/22/20 1157         6 Minute Walk   Phase  Initial     Distance  1445 feet     Walk Time  6 minutes     # of Rest Breaks  0     MPH  2.74     METS  3.45     RPE  8     VO2 Peak   12.08     Symptoms  Yes (comment)     Comments  little burning from deeper breathing     Resting HR  88 bpm     Resting BP  108/64     Resting Oxygen Saturation   97 %     Exercise Oxygen Saturation  during 6 min walk  98 %     Max Ex. HR  113 bpm     Max Ex. BP  142/60     2 Minute Post BP  128/64        Oxygen Initial Assessment:   Oxygen Re-Evaluation:   Oxygen Discharge (Final Oxygen Re-Evaluation):   Initial Exercise Prescription: Initial Exercise Prescription - 02/22/20 1100      Date of Initial Exercise RX and Referring Provider   Date  02/22/20    Referring Provider  Wyline Mood MD ([place])      Treadmill   MPH  3    Grade  0.5    Minutes  15    METs  3.5      Elliptical   Level  1    Speed  3.2    Minutes  15      REL-XR   Level  3    Speed  50    Minutes  15    METs  3      T5 Nustep   Level  3    SPM  80    Minutes  15    METs  3      Prescription Details   Frequency (times per week)  3    Duration  Progress to 30 minutes of continuous aerobic without signs/symptoms of physical distress      Intensity   THRR 40-80% of Max Heartrate  114-139    Ratings of Perceived Exertion  11-13    Perceived Dyspnea  0-4      Progression   Progression  Continue to progress workloads to maintain intensity without signs/symptoms of physical distress.      Resistance Training   Training Prescription  Yes    Weight  3 lb    Reps  10-15       Perform Capillary Blood Glucose checks as needed.  Exercise Prescription Changes: Exercise Prescription Changes    Row Name 02/22/20 1100 03/12/20 1300           Response to Exercise   Blood Pressure (Admit)  108/64  136/80      Blood Pressure (Exercise)  142/60  138/64  Blood Pressure (Exit)  128/64  116/64      Heart Rate (Admit)  88 bpm  73 bpm      Heart Rate (Exercise)  133 bpm  106 bpm      Heart Rate (Exit)  98 bpm  92 bpm      Oxygen Saturation (Admit)  97 %  --      Oxygen Saturation  (Exercise)  98 %  --      Rating of Perceived Exertion (Exercise)  8  11      Symptoms  burning in chest from deeper breathing  --      Comments  walk test results  --      Duration  --  Continue with 30 min of aerobic exercise without signs/symptoms of physical distress.      Intensity  --  THRR unchanged        Progression   Progression  --  Continue to progress workloads to maintain intensity without signs/symptoms of physical distress.        Resistance Training   Training Prescription  --  Yes      Weight  --  3 lb      Reps  --  10-15        Recumbant Bike   Level  --  5      RPM  --  60      Watts  --  41      Minutes  --  15      METs  --  3.3        NuStep   Level  --  5      SPM  --  80      Minutes  --  15      METs  --  4.2         Exercise Comments:   Exercise Goals and Review: Exercise Goals    Row Name 02/22/20 1200             Exercise Goals   Increase Physical Activity  Yes       Intervention  Provide advice, education, support and counseling about physical activity/exercise needs.;Develop an individualized exercise prescription for aerobic and resistive training based on initial evaluation findings, risk stratification, comorbidities and participant's personal goals.       Expected Outcomes  Short Term: Attend rehab on a regular basis to increase amount of physical activity.;Long Term: Add in home exercise to make exercise part of routine and to increase amount of physical activity.;Long Term: Exercising regularly at least 3-5 days a week.       Increase Strength and Stamina  Yes       Intervention  Provide advice, education, support and counseling about physical activity/exercise needs.;Develop an individualized exercise prescription for aerobic and resistive training based on initial evaluation findings, risk stratification, comorbidities and participant's personal goals.       Expected Outcomes  Short Term: Increase workloads from initial exercise  prescription for resistance, speed, and METs.;Short Term: Perform resistance training exercises routinely during rehab and add in resistance training at home;Long Term: Improve cardiorespiratory fitness, muscular endurance and strength as measured by increased METs and functional capacity (6MWT)       Able to understand and use rate of perceived exertion (RPE) scale  Yes       Intervention  Provide education and explanation on how to use RPE scale       Expected Outcomes  Short Term: Able to use RPE daily in rehab to express subjective intensity level;Long Term:  Able to use RPE to guide intensity level when exercising independently       Able to understand and use Dyspnea scale  Yes       Intervention  Provide education and explanation on how to use Dyspnea scale       Expected Outcomes  Long Term: Able to use Dyspnea scale to guide intensity level when exercising independently;Short Term: Able to use Dyspnea scale daily in rehab to express subjective sense of shortness of breath during exertion       Knowledge and understanding of Target Heart Rate Range (THRR)  Yes       Intervention  Provide education and explanation of THRR including how the numbers were predicted and where they are located for reference       Expected Outcomes  Short Term: Able to state/look up THRR;Long Term: Able to use THRR to govern intensity when exercising independently;Short Term: Able to use daily as guideline for intensity in rehab       Able to check pulse independently  Yes       Intervention  Provide education and demonstration on how to check pulse in carotid and radial arteries.;Review the importance of being able to check your own pulse for safety during independent exercise       Expected Outcomes  Short Term: Able to explain why pulse checking is important during independent exercise;Long Term: Able to check pulse independently and accurately       Understanding of Exercise Prescription  Yes       Intervention   Provide education, explanation, and written materials on patient's individual exercise prescription       Expected Outcomes  Short Term: Able to explain program exercise prescription;Long Term: Able to explain home exercise prescription to exercise independently          Exercise Goals Re-Evaluation : Exercise Goals Re-Evaluation    Row Name 02/28/20 1033 03/08/20 0941 03/12/20 1325         Exercise Goal Re-Evaluation   Exercise Goals Review  Increase Physical Activity;Understanding of Exercise Prescription;Able to understand and use rate of perceived exertion (RPE) scale;Knowledge and understanding of Target Heart Rate Range (THRR);Increase Strength and Stamina;Able to check pulse independently  Increase Physical Activity;Increase Strength and Stamina;Understanding of Exercise Prescription  Increase Physical Activity;Increase Strength and Stamina;Able to understand and use rate of perceived exertion (RPE) scale;Able to understand and use Dyspnea scale;Knowledge and understanding of Target Heart Rate Range (THRR);Able to check pulse independently;Understanding of Exercise Prescription     Comments  Reviewed RPE scale, THR and program prescription with pt today.  Pt voiced understanding and was given a copy of goals to take home.  Joshua Haley is off to a good start in rehab.  He can already tell it is speeding up his recovery and giving him more strength and movement.  Reviewed home exercise with pt today.  Pt plans to walk and use staff videos for exercise.  Reviewed THR, pulse, RPE, sign and symptoms, and when to call 911 or MD.  Also discussed weather considerations and indoor options.  Pt voiced understanding.  He hopes to join [employer] after graduation.  Richard continues to progress well.  Staff will monitor progress.     Expected Outcomes  Short: Use RPE daily to regulate intensity. Long: Follow program prescription in THR.  Short: Start to add in more walking at home.  Long:  Continue to build  stamina.  Short:  increase worklaods to reach RPE 11-13 Long: increase overall MET level        Discharge Exercise Prescription (Final Exercise Prescription Changes): Exercise Prescription Changes - 03/12/20 1300      Response to Exercise   Blood Pressure (Admit)  136/80    Blood Pressure (Exercise)  138/64    Blood Pressure (Exit)  116/64    Heart Rate (Admit)  73 bpm    Heart Rate (Exercise)  106 bpm    Heart Rate (Exit)  92 bpm    Rating of Perceived Exertion (Exercise)  11    Duration  Continue with 30 min of aerobic exercise without signs/symptoms of physical distress.    Intensity  THRR unchanged      Progression   Progression  Continue to progress workloads to maintain intensity without signs/symptoms of physical distress.      Resistance Training   Training Prescription  Yes    Weight  3 lb    Reps  10-15      Recumbant Bike   Level  5    RPM  60    Watts  41    Minutes  15    METs  3.3      NuStep   Level  5    SPM  80    Minutes  15    METs  4.2       Nutrition:  Target Goals: Understanding of nutrition guidelines, daily intake of sodium '1500mg'$ , cholesterol '200mg'$ , calories 30% from fat and 7% or less from saturated fats, daily to have 5 or more servings of fruits and vegetables.  Biometrics: Pre Biometrics - 02/22/20 1201      Pre Biometrics   Height  6' 0.5" (1.842 m)    Weight  218 lb (98.9 kg)    BMI (Calculated)  29.14    Single Leg Stand  30 seconds        Nutrition Therapy Plan and Nutrition Goals: Nutrition Therapy & Goals - 02/27/20 1101      Nutrition Therapy   Diet  HH, DM, Low Na    Protein (specify units)  90-95g    Fiber  30 grams    Whole Grain Foods  3 servings    Saturated Fats  12 max. grams    Fruits and Vegetables  5 servings/day    Sodium  1.5 grams      Personal Nutrition Goals   Nutrition Goal  ST: water LT: weight maintenance    Comments  Not on insulin, last A1c 8, doesn't have new one yet. B: 2 eggs w/ some  cheese and some spring mix - 2 oz of oatmeal, grilled chicken sometimes. Coffee w/ stevia. S: fruit  L:moes (beans, cilantro, salad) D: wraps with [place] and cheese with mustard (low carb wrap)S: low sugar ice cream Drinks: sugar free gatorade, sugar free ginger ale. Discussed HH and DM friendly eating. Suggested lowering Na, adding vegetables to dinner, adding protein or fat to fruit snack, discussed adding water.      Intervention Plan   Intervention  Prescribe, educate and counsel regarding individualized specific dietary modifications aiming towards targeted core components such as weight, hypertension, lipid management, diabetes, heart failure and other comorbidities.;Nutrition handout(s) given to patient.    Expected Outcomes  Short Term Goal: Understand basic principles of dietary content, such as calories, fat, sodium, cholesterol and nutrients.;Short Term Goal: A plan has been developed with personal  nutrition goals set during dietitian appointment.;Long Term Goal: Adherence to prescribed nutrition plan.       Nutrition Assessments: Nutrition Assessments - 02/22/20 1204      MEDFICTS Scores   Pre Score  14       Nutrition Goals Re-Evaluation:   Nutrition Goals Discharge (Final Nutrition Goals Re-Evaluation):   Psychosocial: Target Goals: Acknowledge presence or absence of significant depression and/or stress, maximize coping skills, provide positive support system. Participant is able to verbalize types and ability to use techniques and skills needed for reducing stress and depression.   Initial Review & Psychosocial Screening: Initial Psych Review & Screening - 02/21/20 2694      Initial Review   Current issues with  Current Sleep Concerns;Current Stress Concerns    Source of Stress Concerns  Chronic Illness;Financial    Comments  wears CPAP but gets at least 6 hours of sleep since using it, health some financial stressors but it is okay right now      [place]?  Yes   wife, Church family and pastor, prayers   Comments  Relies strongly on his [place], prayers, and church family      Barriers   Psychosocial barriers to participate in program  The patient should benefit from training in stress management and relaxation.;Psychosocial barriers identified (see note)      Screening Interventions   Interventions  Encouraged to exercise;Provide feedback about the scores to participant;To provide support and resources with identified psychosocial needs    Expected Outcomes  Short Term goal: Utilizing psychosocial counselor, staff and physician to assist with identification of specific Stressors or current issues interfering with healing process. Setting desired goal for each stressor or current issue identified.;Long Term Goal: Stressors or current issues are controlled or eliminated.;Short Term goal: Identification and review with participant of any Quality of Life or Depression concerns found by scoring the questionnaire.;Long Term goal: The participant improves quality of Life and PHQ9 Scores as seen by post scores and/or verbalization of changes       Quality of Life Scores:  Quality of Life - 02/22/20 1204      Quality of Life   Select  Quality of Life      Quality of Life Scores   Health/Function Pre  27.2 %    Socioeconomic Pre  26.75 %    Psych/Spiritual Pre  28.21 %    Family Pre  25.2 %    GLOBAL Pre  27.01 %      Scores of 19 and below usually indicate a poorer quality of life in these areas.  A difference of  2-3 points is a clinically meaningful difference.  A difference of 2-3 points in the total score of the Quality of Life Index has been associated with significant improvement in overall quality of life, self-image, physical symptoms, and general health in studies assessing change in quality of life.  PHQ-9: Recent Review Flowsheet Data    Depression screen [facility] 2/9 02/22/2020   Decreased Interest 0   Down,  Depressed, Hopeless 0   PHQ - 2 Score 0   Altered sleeping 0   Tired, decreased energy 0   Change in appetite 0   Feeling bad or failure about yourself  0   Trouble concentrating 0   Moving slowly or fidgety/restless 0   PHQ-9 Score 0   Difficult doing work/chores Not difficult at all     Interpretation of Total Score  Total Score Depression Severity:  1-4 = Minimal depression, 5-9 = Mild depression, 10-14 = Moderate depression, 15-19 = Moderately severe depression, 20-27 = Severe depression   Psychosocial Evaluation and Intervention: Psychosocial Evaluation - 02/21/20 0944      Psychosocial Evaluation & Interventions   Interventions  Stress management education;Encouraged to exercise with the program and follow exercise prescription    Comments  Cloyde is coming into rehab after CABG x2 and is being covered by the [place].  He gets most of his care and medical needs through the [place].  He quit smoking in 2011 and doing well with it.  He has a history of diabetes and is under pretty good control.   No history of depression or anxiety.  He has a stronger support system in his wife and church family.  They have had some financial scares during the past year but have gotten it back under control.  He would like to return to work in some compacity but not sure what he wants to do.  He wants to get healthier and work on his diet so that he is able to work again.    Expected Outcomes  Short: Attend rehab regularly to build up stamina.  Long: Build up stamina to be able to return to work.    Continue Psychosocial Services   Follow up required by staff       Psychosocial Re-Evaluation: Psychosocial Re-Evaluation    [place] Name 03/08/20 0934             Psychosocial Re-Evaluation   Current issues with  Current Stress Concerns;Current Sleep Concerns       Comments  Joshua Haley has been doing well in rehab.  He has enjoyed coming and getting his exercise and meeting his classmates.  He is getting his 6  hours of sleep and using his CPAP but it does dry him out.  He wishes he could sleep better with it.  Overall, things are good.       Expected Outcomes  Short: Continue to improve sleep and review sleep info and video.  Long: Continue to stay positive.       Interventions  Stress management education;Encouraged to attend Cardiac Rehabilitation for the exercise       Continue Psychosocial Services   Follow up required by staff          Psychosocial Discharge (Final Psychosocial Re-Evaluation): Psychosocial Re-Evaluation - 03/08/20 0934      Psychosocial Re-Evaluation   Current issues with  Current Stress Concerns;Current Sleep Concerns    Comments  Joshua Haley has been doing well in rehab.  He has enjoyed coming and getting his exercise and meeting his classmates.  He is getting his 6 hours of sleep and using his CPAP but it does dry him out.  He wishes he could sleep better with it.  Overall, things are good.    Expected Outcomes  Short: Continue to improve sleep and review sleep info and video.  Long: Continue to stay positive.    Interventions  Stress management education;Encouraged to attend Cardiac Rehabilitation for the exercise    Continue Psychosocial Services   Follow up required by staff       Vocational Rehabilitation: Provide vocational rehab assistance to qualifying candidates.   Vocational Rehab Evaluation & Intervention: Vocational Rehab - 02/21/20 0762      Initial Vocational Rehab Evaluation & Intervention   Assessment shows need for Vocational Rehabilitation  --   not sure yet  Education: Education Goals: Education classes will be provided on a variety of topics geared toward better understanding of heart health and risk factor modification. Participant will state understanding/return demonstration of topics presented as noted by education test scores.  Learning Barriers/Preferences: Learning Barriers/Preferences - 02/21/20 3832      Learning Barriers/Preferences    Learning Barriers  Sight   glasses   Learning Preferences  Skilled Demonstration;Written Material       Education Topics:  AED/CPR: - Group verbal and written instruction with the use of models to demonstrate the basic use of the AED with the basic ABC's of resuscitation.   General Nutrition Guidelines/Fats and Fiber: -Group instruction provided by verbal, written material, models and posters to present the general guidelines for heart healthy nutrition. Gives an explanation and review of dietary fats and fiber.   Controlling Sodium/Reading Food Labels: -Group verbal and written material supporting the discussion of sodium use in heart healthy nutrition. Review and explanation with models, verbal and written materials for utilization of the food label.   Exercise Physiology & General Exercise Guidelines: - Group verbal and written instruction with models to review the exercise physiology of the cardiovascular system and associated critical values. Provides general exercise guidelines with specific guidelines to those with heart or lung disease.    Aerobic Exercise & Resistance Training: - Gives group verbal and written instruction on the various components of exercise. Focuses on aerobic and resistive training programs and the benefits of this training and how to safely progress through these programs..   Flexibility, Balance, Mind/Body Relaxation: Provides group verbal/written instruction on the benefits of flexibility and balance training, including mind/body exercise modes such as yoga, pilates and tai chi.  Demonstration and skill practice provided.   Stress and Anxiety: - Provides group verbal and written instruction about the health risks of elevated stress and causes of high stress.  Discuss the correlation between heart/lung disease and anxiety and treatment options. Review healthy ways to manage with stress and anxiety.   Depression: - Provides group verbal and  written instruction on the correlation between heart/lung disease and depressed mood, treatment options, and the stigmas associated with seeking treatment.   Anatomy & Physiology of the Heart: - Group verbal and written instruction and models provide basic cardiac anatomy and physiology, with the coronary electrical and arterial systems. Review of Valvular disease and Heart Failure   Cardiac Procedures: - Group verbal and written instruction to review commonly prescribed medications for heart disease. Reviews the medication, class of the drug, and side effects. Includes the steps to properly store meds and maintain the prescription regimen. (beta blockers and nitrates)   Cardiac Medications I: - Group verbal and written instruction to review commonly prescribed medications for heart disease. Reviews the medication, class of the drug, and side effects. Includes the steps to properly store meds and maintain the prescription regimen.   Cardiac Medications II: -Group verbal and written instruction to review commonly prescribed medications for heart disease. Reviews the medication, class of the drug, and side effects. (all other drug classes)    Go Sex-Intimacy & Heart Disease, Get SMART - Goal Setting: - Group verbal and written instruction through game format to discuss heart disease and the return to sexual intimacy. Provides group verbal and written material to discuss and apply goal setting through the application of the S.M.A.R.T. Method.   Other Matters of the Heart: - Provides group verbal, written materials and models to describe Stable Angina and Peripheral Artery. Includes description  of the disease process and treatment options available to the cardiac patient.   Exercise & Equipment Safety: - Individual verbal instruction and demonstration of equipment use and safety with use of the equipment.   Cardiac Rehab from 02/22/2020 in [facility] Cardiac and Pulmonary Rehab  Date  02/22/20   Educator  [facility]  Instruction Review Code  1- Verbalizes Understanding      Infection Prevention: - Provides verbal and written material to individual with discussion of infection control including proper hand washing and proper equipment cleaning during exercise session.   Cardiac Rehab from 02/22/2020 in [facility] Cardiac and Pulmonary Rehab  Date  02/22/20  Educator  [facility]  Instruction Review Code  1- Verbalizes Understanding      Falls Prevention: - Provides verbal and written material to individual with discussion of falls prevention and safety.   Cardiac Rehab from 02/22/2020 in [facility] Cardiac and Pulmonary Rehab  Date  02/22/20  Educator  [facility]  Instruction Review Code  1- Verbalizes Understanding      Diabetes: - Individual verbal and written instruction to review signs/symptoms of diabetes, desired ranges of glucose level fasting, after meals and with exercise. Acknowledge that pre and post exercise glucose checks will be done for 3 sessions at entry of program.   Cardiac Rehab from 02/22/2020 in [facility] Cardiac and Pulmonary Rehab  Date  02/21/20  Educator  [facility]  Instruction Review Code  1- Verbalizes Understanding      Know Your Numbers and Risk Factors: -Group verbal and written instruction about important numbers in your health.  Discussion of what are risk factors and how they play a role in the disease process.  Review of Cholesterol, Blood Pressure, Diabetes, and BMI and the role they play in your overall health.   Sleep Hygiene: -Provides group verbal and written instruction about how sleep can affect your health.  Define sleep hygiene, discuss sleep cycles and impact of sleep habits. Review good sleep hygiene tips.    Other: -Provides group and verbal instruction on various topics (see comments)   Knowledge Questionnaire Score: Knowledge Questionnaire Score - 02/22/20 1204      Knowledge Questionnaire Score   Pre Score  24/26 Education Focus: Nutrition, Depression        Core Components/Risk Factors/Patient Goals at Admission: Personal Goals and Risk Factors at Admission - 02/22/20 1205      Core Components/Risk Factors/Patient Goals on Admission    Weight Management  Yes;Weight Loss    Intervention  Weight Management: Develop a combined nutrition and exercise program designed to reach desired caloric intake, while maintaining appropriate intake of nutrient and fiber, sodium and fats, and appropriate energy expenditure required for the weight goal.;Weight Management: Provide education and appropriate resources to help participant work on and attain dietary goals.    Admit Weight  218 lb (98.9 kg)    Goal Weight: Short Term  213 lb (96.6 kg)    Goal Weight: Long Term  200 lb (90.7 kg)    Expected Outcomes  Short Term: Continue to assess and modify interventions until short term weight is achieved;Long Term: Adherence to nutrition and physical activity/exercise program aimed toward attainment of established weight goal;Weight Loss: Understanding of general recommendations for a balanced deficit meal plan, which promotes 1-2 lb weight loss per week and includes a negative energy balance of [phone] kcal/d;Understanding recommendations for meals to include 15-35% energy as protein, 25-35% energy from fat, 35-60% energy from carbohydrates, less than '200mg'$  of dietary cholesterol, 20-35 gm of  total fiber daily;Understanding of distribution of calorie intake throughout the day with the consumption of 4-5 meals/snacks    Diabetes  Yes    Intervention  Provide education about signs/symptoms and action to take for hypo/hyperglycemia.;Provide education about proper nutrition, including hydration, and aerobic/resistive exercise prescription along with prescribed medications to achieve blood glucose in normal ranges: Fasting glucose 65-99 mg/dL    Expected Outcomes  Short Term: Participant verbalizes understanding of the signs/symptoms and immediate care of hyper/hypoglycemia,  proper foot care and importance of medication, aerobic/resistive exercise and nutrition plan for blood glucose control.;Long Term: Attainment of HbA1C < 7%.    Hypertension  Yes    Intervention  Provide education on lifestyle modifcations including regular physical activity/exercise, weight management, moderate sodium restriction and increased consumption of fresh fruit, vegetables, and low fat dairy, alcohol moderation, and smoking cessation.;Monitor prescription use compliance.    Expected Outcomes  Short Term: Continued assessment and intervention until BP is < 140/63m HG in hypertensive participants. < 130/819mHG in hypertensive participants with diabetes, heart failure or chronic kidney disease.;Long Term: Maintenance of blood pressure at goal levels.    Lipids  Yes    Intervention  Provide education and support for participant on nutrition & aerobic/resistive exercise along with prescribed medications to achieve LDL '70mg'$ , HDL >'40mg'$ .    Expected Outcomes  Short Term: Participant states understanding of desired cholesterol values and is compliant with medications prescribed. Participant is following exercise prescription and nutrition guidelines.;Long Term: Cholesterol controlled with medications as prescribed, with individualized exercise RX and with personalized nutrition plan. Value goals: LDL < '70mg'$ , HDL > 40 mg.       Core Components/Risk Factors/Patient Goals Review:  Goals and Risk Factor Review    Row Name 03/08/20 0939             Core Components/Risk Factors/Patient Goals Review   Personal Goals Review  Weight Management/Obesity;Hypertension;Diabetes       Review  RiDelfino Lovetts doing well and losing weight.  He is down to 209lb but would like to lose more.  He really wants to lose more!!  He is working on diet and exercise.  His pressures have been good, meds are working!  Blood sugars are doing okay, 186 today after OJ last night when he was low. He will continue to work on this.        Expected Outcomes  Short: Continue to work on diabetes management and weight loss  Long: Continue to monitor risk factors.          Core Components/Risk Factors/Patient Goals at Discharge (Final Review):  Goals and Risk Factor Review - 03/08/20 0939      Core Components/Risk Factors/Patient Goals Review   Personal Goals Review  Weight Management/Obesity;Hypertension;Diabetes    Review  RiDelfino Lovetts doing well and losing weight.  He is down to 209lb but would like to lose more.  He really wants to lose more!!  He is working on diet and exercise.  His pressures have been good, meds are working!  Blood sugars are doing okay, 186 today after OJ last night when he was low. He will continue to work on this.    Expected Outcomes  Short: Continue to work on diabetes management and weight loss  Long: Continue to monitor risk factors.       ITP Comments: ITP Comments    Row Name 02/21/20 0957 02/22/20 1154 02/27/20 1114 02/28/20 1031 03/20/20 0617   ITP Comments  Completed virtual orientation today.  EP evaluation is scheduled for Thursday 2/25  at 930am.  Documentation for diagnosis can be found in CE encounter 01/26/20.  Completed 6MWT and gym orientation.  Initial ITP created and sent for review to Dr. Emily Filbert, Medical Director.  Completed Initial RD Eval  First full day of exercise!  Patient was oriented to gym and equipment including functions, settings, policies, and procedures.  Patient's individual exercise prescription and treatment plan were reviewed.  All starting workloads were established based on the results of the 6 minute walk test done at initial orientation visit.  The plan for exercise progression was also introduced and progression will be customized based on patient's performance and goals  30 day chart review completed. ITP sent to Dr Zachery Dakins Medical Director, for review,changes as needed and signature. Continue with ITP if no changes requested      Comments:

## 2020-03-22 ENCOUNTER — Encounter: Payer: No Typology Code available for payment source | Admitting: *Deleted

## 2020-03-22 ENCOUNTER — Other Ambulatory Visit: Payer: Self-pay

## 2020-03-22 DIAGNOSIS — Z951 Presence of aortocoronary bypass graft: Secondary | ICD-10-CM | POA: Diagnosis not present

## 2020-03-22 NOTE — Progress Notes (Signed)
Daily Session Note  Patient Details  Name: ELYJAH HAZAN MRN: [id number] Date of Birth: 08/17/1951 Referring Provider:     Cardiac Rehab from 02/22/2020 in [facility] Cardiac and Pulmonary Rehab  Referring Provider  Wyline Mood MD ([place])      Encounter Date: 03/22/2020  Check In: Session Check In - 03/22/20 7225      Check-In   Supervising physician immediately available to respond to emergencies  See telemetry face sheet for immediately available ER MD    Location  [facility]-Cardiac & Pulmonary Rehab    Staff Present  Renita Papa, RN BSN;Joseph [address] [place], [place], RCEP, CCRP, CCET    Virtual Visit  No    Medication changes reported      No    Fall or balance concerns reported     No    Warm-up and Cool-down  Performed on first and last piece of equipment    Resistance Training Performed  Yes    VAD Patient?  No    PAD/SET Patient?  No      Pain Assessment   Currently in Pain?  No/denies          Social History   Tobacco Use  Smoking Status Former Smoker  Smokeless Tobacco Never Used    Goals Met:  Independence with exercise equipment Exercise tolerated well No report of cardiac concerns or symptoms Strength training completed today  Goals Unmet:  Not Applicable  Comments: Pt able to follow exercise prescription today without complaint.  Will continue to monitor for progression.    Dr. Emily Filbert is Medical Director for [place] and [facility].

## 2020-03-25 ENCOUNTER — Other Ambulatory Visit: Payer: Self-pay

## 2020-03-25 ENCOUNTER — Encounter: Payer: No Typology Code available for payment source | Admitting: *Deleted

## 2020-03-25 DIAGNOSIS — Z951 Presence of aortocoronary bypass graft: Secondary | ICD-10-CM | POA: Diagnosis not present

## 2020-03-25 NOTE — Progress Notes (Signed)
Daily Session Note  Patient Details  Name: Joshua Haley MRN: [id number] Date of Birth: May 28, 1951 Referring Provider:     Cardiac Rehab from 02/22/2020 in [facility] Cardiac and Pulmonary Rehab  Referring Provider  Wyline Mood MD ([place])      Encounter Date: 03/25/2020  Check In: Session Check In - 03/25/20 1009      Check-In   Supervising physician immediately available to respond to emergencies  See telemetry face sheet for immediately available ER MD    Location  [facility]-Cardiac & Pulmonary Rehab    Staff Present  Renita Papa, RN BSN;Joseph [address] [place], [place], ACSM CEP, Exercise Physiologist    Virtual Visit  No    Medication changes reported      No    Fall or balance concerns reported     No    Resistance Training Performed  Yes    VAD Patient?  No    PAD/SET Patient?  No      Pain Assessment   Currently in Pain?  No/denies          Social History   Tobacco Use  Smoking Status Former Smoker  Smokeless Tobacco Never Used    Goals Met:  Independence with exercise equipment Exercise tolerated well No report of cardiac concerns or symptoms Strength training completed today  Goals Unmet:  Not Applicable  Comments: Pt able to follow exercise prescription today without complaint.  Will continue to monitor for progression.    Dr. Emily Filbert is Medical Director for [place] and [facility].

## 2020-03-27 ENCOUNTER — Encounter: Payer: No Typology Code available for payment source | Admitting: *Deleted

## 2020-03-27 ENCOUNTER — Other Ambulatory Visit: Payer: Self-pay

## 2020-03-27 DIAGNOSIS — Z951 Presence of aortocoronary bypass graft: Secondary | ICD-10-CM | POA: Diagnosis not present

## 2020-03-27 NOTE — Progress Notes (Signed)
Daily Session Note  Patient Details  Name: Joshua Haley MRN: [id number] Date of Birth: Dec 02, 1951 Referring Provider:     Cardiac Rehab from 02/22/2020 in [facility] Cardiac and Pulmonary Rehab  Referring Provider  Wyline Mood MD ([place])      Encounter Date: 03/27/2020  Check In: Session Check In - 03/27/20 0941      Check-In   Supervising physician immediately available to respond to emergencies  See telemetry face sheet for immediately available ER MD    Location  [facility]-Cardiac & Pulmonary Rehab    Staff Present  Heath Lark, RN, BSN, CCRP;Melissa Caiola RDN, LDN;Joseph Hood RCP,RRT,BSRT    Virtual Visit  No    Medication changes reported      No    Fall or balance concerns reported     No    Warm-up and Cool-down  Performed on first and last piece of equipment    Resistance Training Performed  Yes    VAD Patient?  No    PAD/SET Patient?  No      Pain Assessment   Currently in Pain?  No/denies          Social History   Tobacco Use  Smoking Status Former Smoker  Smokeless Tobacco Never Used    Goals Met:  Independence with exercise equipment Exercise tolerated well No report of cardiac concerns or symptoms  Goals Unmet:  Not Applicable  Comments: Pt able to follow exercise prescription today without complaint.  Will continue to monitor for progression.    Dr. Emily Filbert is Medical Director for [place] and [facility].

## 2020-03-29 ENCOUNTER — Encounter: Payer: No Typology Code available for payment source | Attending: Internal Medicine | Admitting: *Deleted

## 2020-03-29 ENCOUNTER — Other Ambulatory Visit: Payer: Self-pay

## 2020-03-29 DIAGNOSIS — Z951 Presence of aortocoronary bypass graft: Secondary | ICD-10-CM | POA: Insufficient documentation

## 2020-03-29 NOTE — Progress Notes (Signed)
Daily Session Note  Patient Details  Name: Joshua Haley MRN: [id number] Date of Birth: February 10, 1951 Referring Provider:     Cardiac Rehab from 02/22/2020 in [facility] Cardiac and Pulmonary Rehab  Referring Provider  Wyline Mood MD ([place])      Encounter Date: 03/29/2020  Check In: Session Check In - 03/29/20 1015      Check-In   Supervising physician immediately available to respond to emergencies  See telemetry face sheet for immediately available ER MD    Location  [facility]-Cardiac & Pulmonary Rehab    Staff Present  Heath Lark, RN, BSN, CCRP;Meredith Sherryll Burger, RN BSN;Joseph Hood RCP,RRT,BSRT    Virtual Visit  No    Medication changes reported      No    Fall or balance concerns reported     No    Warm-up and Cool-down  Performed on first and last piece of equipment    Resistance Training Performed  Yes    VAD Patient?  No    PAD/SET Patient?  No      Pain Assessment   Currently in Pain?  No/denies          Social History   Tobacco Use  Smoking Status Former Smoker  Smokeless Tobacco Never Used    Goals Met:  Independence with exercise equipment Exercise tolerated well No report of cardiac concerns or symptoms  Goals Unmet:  Not Applicable  Comments: Pt able to follow exercise prescription today without complaint.  Will continue to monitor for progression.    Dr. Emily Filbert is Medical Director for [place] and [facility].

## 2020-04-01 ENCOUNTER — Other Ambulatory Visit: Payer: Self-pay

## 2020-04-01 ENCOUNTER — Encounter: Payer: No Typology Code available for payment source | Admitting: *Deleted

## 2020-04-01 DIAGNOSIS — Z951 Presence of aortocoronary bypass graft: Secondary | ICD-10-CM

## 2020-04-01 NOTE — Progress Notes (Signed)
Daily Session Note  Patient Details  Name: Joshua Haley MRN: [id number] Date of Birth: 07/11/1951 Referring Provider:     Cardiac Rehab from 02/22/2020 in [facility] Cardiac and Pulmonary Rehab  Referring Provider  Wyline Mood MD ([place])      Encounter Date: 04/01/2020  Check In: Session Check In - 04/01/20 1009      Check-In   Supervising physician immediately available to respond to emergencies  See telemetry face sheet for immediately available ER MD    Location  [facility]-Cardiac & Pulmonary Rehab    Staff Present  Heath Lark, RN, BSN, CCRP;Meredith Sherryll Burger, RN Moises Blood, BS, ACSM CEP, Exercise Physiologist;Joseph Tessie Fass RCP,RRT,BSRT    Virtual Visit  No    Medication changes reported      No    Fall or balance concerns reported     No    Warm-up and Cool-down  Performed on first and last piece of equipment    Resistance Training Performed  Yes    VAD Patient?  No    PAD/SET Patient?  No      Pain Assessment   Currently in Pain?  No/denies          Social History   Tobacco Use  Smoking Status Former Smoker  Smokeless Tobacco Never Used    Goals Met:  Independence with exercise equipment Exercise tolerated well No report of cardiac concerns or symptoms  Goals Unmet:  Not Applicable  Comments: Pt able to follow exercise prescription today without complaint.  Will continue to monitor for progression.    Dr. Emily Filbert is Medical Director for [place] and [facility].

## 2020-04-03 ENCOUNTER — Other Ambulatory Visit: Payer: Self-pay

## 2020-04-03 ENCOUNTER — Encounter: Payer: No Typology Code available for payment source | Admitting: *Deleted

## 2020-04-03 DIAGNOSIS — Z951 Presence of aortocoronary bypass graft: Secondary | ICD-10-CM | POA: Diagnosis not present

## 2020-04-03 NOTE — Progress Notes (Signed)
Daily Session Note  Patient Details  Name: Saburo R Mcenaney MRN: [id number] Date of Birth: 05/22/1951 Referring Provider:     Cardiac Rehab from 02/22/2020 in [facility] Cardiac and Pulmonary Rehab  Referring Provider  Howard, Athena [place] ([place])      Encounter Date: 04/03/2020  Check In: Session Check In - 04/03/20 0946      Check-In   Staff Present  Mary Jo Abernethy, RN, BSN, MA;Susanne Bice, RN, BSN, CCRP;Joseph Hood RCP,RRT,BSRT    Virtual Visit  No    Medication changes reported      No    Fall or balance concerns reported     No    Warm-up and Cool-down  Performed on first and last piece of equipment    Resistance Training Performed  Yes    PAD/SET Patient?  No      Pain Assessment   Currently in Pain?  No/denies          Social History   Tobacco Use  Smoking Status Former Smoker  Smokeless Tobacco Never Used    Goals Met:  Independence with exercise equipment Exercise tolerated well No report of cardiac concerns or symptoms  Goals Unmet:  Not Applicable  Comments: Pt able to follow exercise prescription today without complaint.  Will continue to monitor for progression.   Dr. Mark Miller is Medical Director for [facility] and [facility]. 

## 2020-04-05 ENCOUNTER — Encounter: Payer: No Typology Code available for payment source | Admitting: *Deleted

## 2020-04-05 ENCOUNTER — Other Ambulatory Visit: Payer: Self-pay

## 2020-04-05 DIAGNOSIS — Z951 Presence of aortocoronary bypass graft: Secondary | ICD-10-CM | POA: Diagnosis not present

## 2020-04-05 NOTE — Progress Notes (Signed)
Daily Session Note  Patient Details  Name: Joshua Haley MRN: [id number] Date of Birth: 01/14/1951 Referring Provider:     Cardiac Rehab from 02/22/2020 in [facility] Cardiac and Pulmonary Rehab  Referring Provider  Howard, Athena MD ([place])      Encounter Date: 04/05/2020  Check In: Session Check In - 04/05/20 0945      Check-In   Supervising physician immediately available to respond to emergencies  See telemetry face sheet for immediately available ER MD    Location  [facility]-Cardiac & Pulmonary Rehab    Staff Present  Meredith Craven, RN BSN;Joseph Hood RCP,RRT,BSRT;Amanda Sommer, BA, ACSM CEP, Exercise Physiologist    Virtual Visit  No    Medication changes reported      No    Fall or balance concerns reported     No    Warm-up and Cool-down  Performed on first and last piece of equipment    Resistance Training Performed  Yes    VAD Patient?  No    PAD/SET Patient?  No      Pain Assessment   Currently in Pain?  No/denies          Social History   Tobacco Use  Smoking Status Former Smoker  Smokeless Tobacco Never Used    Goals Met:  Independence with exercise equipment Exercise tolerated well No report of cardiac concerns or symptoms Strength training completed today  Goals Unmet:  Not Applicable  Comments: Pt able to follow exercise prescription today without complaint.  Will continue to monitor for progression.    Dr. Mark Miller is Medical Director for [facility] and [facility]. 

## 2020-04-08 ENCOUNTER — Other Ambulatory Visit: Payer: Self-pay

## 2020-04-08 ENCOUNTER — Encounter: Payer: No Typology Code available for payment source | Admitting: *Deleted

## 2020-04-08 DIAGNOSIS — Z951 Presence of aortocoronary bypass graft: Secondary | ICD-10-CM

## 2020-04-08 NOTE — Progress Notes (Signed)
Daily Session Note  Patient Details  Name: Joshua Haley MRN: [id number] Date of Birth: 1951/04/21 Referring Provider:     Cardiac Rehab from 02/22/2020 in [facility] Cardiac and Pulmonary Rehab  Referring Provider  Wyline Mood MD ([place])      Encounter Date: 04/08/2020  Check In: Session Check In - 04/08/20 0952      Check-In   Supervising physician immediately available to respond to emergencies  See telemetry face sheet for immediately available ER MD    Location  [facility]-Cardiac & Pulmonary Rehab    Staff Present  Heath Lark, RN, BSN, Laveda Norman, BS, ACSM CEP, Exercise Physiologist;Joseph Tessie Fass RCP,RRT,BSRT    Virtual Visit  No    Medication changes reported      No    Fall or balance concerns reported     No    Warm-up and Cool-down  Performed on first and last piece of equipment    Resistance Training Performed  Yes    VAD Patient?  No    PAD/SET Patient?  No      Pain Assessment   Currently in Pain?  No/denies          Social History   Tobacco Use  Smoking Status Former Smoker  Smokeless Tobacco Never Used    Goals Met:  Independence with exercise equipment Exercise tolerated well No report of cardiac concerns or symptoms  Goals Unmet:  Not Applicable  Comments: Pt able to follow exercise prescription today without complaint.  Will continue to monitor for progression.    Dr. Emily Filbert is Medical Director for [place] and [facility].

## 2020-04-10 ENCOUNTER — Other Ambulatory Visit: Payer: Self-pay

## 2020-04-10 ENCOUNTER — Encounter: Payer: No Typology Code available for payment source | Admitting: *Deleted

## 2020-04-10 DIAGNOSIS — Z951 Presence of aortocoronary bypass graft: Secondary | ICD-10-CM | POA: Diagnosis not present

## 2020-04-10 NOTE — Progress Notes (Signed)
Daily Session Note  Patient Details  Name: Joshua Haley MRN: [id number] Date of Birth: [birth year] Referring Provider:     Cardiac Rehab from 02/22/2020 in [facility] Cardiac and Pulmonary Rehab  Referring Provider  Wyline Mood MD ([place])      Encounter Date: 04/10/2020  Check In: Session Check In - 04/10/20 1038      Check-In   Supervising physician immediately available to respond to emergencies  See telemetry face sheet for immediately available ER MD    Location  [facility]-Cardiac & Pulmonary Rehab    Staff Present  Renita Papa, RN BSN;Joseph Hood RCP,RRT,BSRT;Amanda Oletta Darter, [place], ACSM CEP, Exercise Physiologist    Virtual Visit  No    Medication changes reported      No    Fall or balance concerns reported     No    Warm-up and Cool-down  Performed on first and last piece of equipment    Resistance Training Performed  Yes    VAD Patient?  No    PAD/SET Patient?  No      Pain Assessment   Currently in Pain?  No/denies          Social History   Tobacco Use  Smoking Status Former Smoker  Smokeless Tobacco Never Used    Goals Met:  Independence with exercise equipment Exercise tolerated well No report of cardiac concerns or symptoms Strength training completed today  Goals Unmet:  Not Applicable  Comments: Pt able to follow exercise prescription today without complaint.  Will continue to monitor for progression.    Dr. Emily Filbert is Medical Director for [place] and [facility].

## 2020-04-11 DIAGNOSIS — Z951 Presence of aortocoronary bypass graft: Secondary | ICD-10-CM

## 2020-04-15 ENCOUNTER — Other Ambulatory Visit: Payer: Self-pay

## 2020-04-15 ENCOUNTER — Encounter: Payer: No Typology Code available for payment source | Admitting: *Deleted

## 2020-04-15 DIAGNOSIS — Z951 Presence of aortocoronary bypass graft: Secondary | ICD-10-CM | POA: Diagnosis not present

## 2020-04-15 NOTE — Progress Notes (Signed)
Daily Session Note  Patient Details  Name: Joshua Haley MRN: [id number] Date of Birth: 1951/11/19 Referring Provider:     Cardiac Rehab from 02/22/2020 in [facility] Cardiac and Pulmonary Rehab  Referring Provider  Wyline Mood MD ([place])      Encounter Date: 04/15/2020  Check In: Session Check In - 04/15/20 0950      Check-In   Supervising physician immediately available to respond to emergencies  See telemetry face sheet for immediately available ER MD    Location  [facility]-Cardiac & Pulmonary Rehab    Staff Present  Heath Lark, RN, BSN, Laveda Norman, BS, ACSM CEP, Exercise Physiologist;Joseph Tessie Fass RCP,RRT,BSRT    Virtual Visit  No    Medication changes reported      No    Fall or balance concerns reported     No    Warm-up and Cool-down  Performed on first and last piece of equipment    Resistance Training Performed  Yes    VAD Patient?  No    PAD/SET Patient?  No      Pain Assessment   Currently in Pain?  No/denies          Social History   Tobacco Use  Smoking Status Former Smoker  Smokeless Tobacco Never Used    Goals Met:  Independence with exercise equipment Exercise tolerated well No report of cardiac concerns or symptoms  Goals Unmet:  Not Applicable  Comments: Pt able to follow exercise prescription today without complaint.  Will continue to monitor for progression.    Dr. Emily Filbert is Medical Director for [place] and [facility].

## 2020-04-17 ENCOUNTER — Encounter: Payer: No Typology Code available for payment source | Admitting: *Deleted

## 2020-04-17 ENCOUNTER — Encounter: Payer: Self-pay | Admitting: *Deleted

## 2020-04-17 ENCOUNTER — Other Ambulatory Visit: Payer: Self-pay

## 2020-04-17 DIAGNOSIS — Z951 Presence of aortocoronary bypass graft: Secondary | ICD-10-CM | POA: Diagnosis not present

## 2020-04-17 NOTE — Progress Notes (Signed)
Cardiac Individual Treatment Plan  Patient Details  Name: Joshua Haley MRN: [id number] Date of Birth: 04-Feb-1951 Referring Provider:     Cardiac Rehab from 02/22/2020 in [facility] Cardiac and Pulmonary Rehab  Referring Provider  Wyline Mood MD ([place])      Initial Encounter Date:    Cardiac Rehab from 02/22/2020 in [facility] Cardiac and Pulmonary Rehab  Date  02/22/20      Visit Diagnosis: S/P CABG x 2  Patient's Home Medications on Admission:  Current Outpatient Medications:  .  aspirin 81 MG chewable tablet, Chew 81 mg by mouth daily., Disp: , Rfl:  .  Cholecalciferol (VITAMIN D3) 125 MCG (5000 UT) CAPS, Take 5,000 Units by mouth daily., Disp: , Rfl:  .  Cyanocobalamin 1000 MCG/15ML LIQD, Take 1,000 mcg by mouth daily., Disp: , Rfl:  .  Ferrous Sulfate (IRON) 28 MG TABS, Take 1 tablet (28 mg total) by mouth daily. Can take any over the counter iron supplements after discharge., Disp: , Rfl:  .  glipiZIDE (GLUCOTROL) 5 MG tablet, Take by mouth., Disp: , Rfl:  .  glyBURIDE (DIABETA) 5 MG tablet, Hold while inpatient., Disp: , Rfl:  .  insulin glargine (LANTUS) 100 UNIT/ML injection, Inject 0.15 mLs (15 Units total) into the skin daily. (Patient not taking: Reported on 02/21/2020), Disp: , Rfl:  .  isosorbide dinitrate (ISORDIL) 10 MG tablet, Take by mouth., Disp: , Rfl:  .  ketoconazole (NIZORAL) 2 % cream, Apply 1 application topically 2 (two) times daily., Disp: , Rfl:  .  ketorolac (ACULAR) 0.4 % SOLN, Place 1 drop into the right eye 4 (four) times daily as needed (pain)., Disp: 1 Bottle, Rfl: 0 .  levothyroxine (SYNTHROID) 137 MCG tablet, Take 137 mcg by mouth daily before breakfast., Disp: , Rfl:  .  lisinopril (ZESTRIL) 20 MG tablet, Take 1 tablet (20 mg total) by mouth daily., Disp: , Rfl:  .  meloxicam (MOBIC) 15 MG tablet, Take 1 tablet (15 mg total) by mouth daily as needed for pain., Disp: , Rfl:  .  metFORMIN (GLUCOPHAGE) 1000 MG tablet, Hold while inpatient., Disp: , Rfl:  .   metoprolol succinate (TOPROL-XL) 50 MG 24 hr tablet, Take 50 mg by mouth daily. Take with or immediately following a meal., Disp: , Rfl:  .  metoprolol tartrate (LOPRESSOR) 25 MG tablet, Take by mouth., Disp: , Rfl:  .  pantoprazole (PROTONIX) 40 MG tablet, Take 40 mg by mouth daily., Disp: , Rfl:   Past Medical History: Past Medical History:  Diagnosis Date  . Diabetes mellitus without complication ([place])   . Kidney stone     Tobacco Use: Social History   Tobacco Use  Smoking Status Former Smoker  Smokeless Tobacco Never Used    Labs: Recent [employer] for ITP Cardiac and Pulmonary Rehab Latest Ref Rng & Units 01/20/2020 01/22/2020   Cholestrol 0 - 200 mg/dL - 246(H)   LDLCALC 0 - 99 mg/dL - 157(H)   HDL >40 mg/dL - 33(L)   Trlycerides <150 mg/dL - 279(H)   Hemoglobin A1c 4.8 - 5.6 % 7.9(H) -       Exercise Target Goals: Exercise Program Goal: Individual exercise prescription set using results from initial 6 min walk test and THRR while considering  patient's activity barriers and safety.   Exercise Prescription Goal: Initial exercise prescription builds to 30-45 minutes a day of aerobic activity, 2-3 days per week.  Home exercise guidelines will be given to  patient during program as part of exercise prescription that the participant will acknowledge.   Education: Aerobic Exercise & Resistance Training: - Gives group verbal and written instruction on the various components of exercise. Focuses on aerobic and resistive training programs and the benefits of this training and how to safely progress through these programs..   Education: Exercise & Equipment Safety: - Individual verbal instruction and demonstration of equipment use and safety with use of the equipment.   Cardiac Rehab from 02/22/2020 in [facility] Cardiac and Pulmonary Rehab  Date  02/22/20  Educator  [facility]  Instruction Review Code  1- Verbalizes Understanding      Education: Exercise Physiology  & General Exercise Guidelines: - Group verbal and written instruction with models to review the exercise physiology of the cardiovascular system and associated critical values. Provides general exercise guidelines with specific guidelines to those with heart or lung disease.    Education: Flexibility, Balance, Mind/Body Relaxation: Provides group verbal/written instruction on the benefits of flexibility and balance training, including mind/body exercise modes such as yoga, pilates and tai chi.  Demonstration and skill practice provided.   Activity Barriers & Risk Stratification: Activity Barriers & Cardiac Risk Stratification - 02/22/20 1158      Activity Barriers & Cardiac Risk Stratification   Activity Barriers  Deconditioning;Muscular Weakness;Back Problems;Joint Problems;Other (comment);Incisional Pain    Comments  R sided back/hip pain especially in bed    Cardiac Risk Stratification  Moderate       6 Minute Walk: 6 Minute Walk    Row Name 02/22/20 1157         6 Minute Walk   Phase  Initial     Distance  1445 feet     Walk Time  6 minutes     # of Rest Breaks  0     MPH  2.74     METS  3.45     RPE  8     VO2 Peak  12.08     Symptoms  Yes (comment)     Comments  little burning from deeper breathing     Resting HR  88 bpm     Resting BP  108/64     Resting Oxygen Saturation   97 %     Exercise Oxygen Saturation  during 6 min walk  98 %     Max Ex. HR  113 bpm     Max Ex. BP  142/60     2 Minute Post BP  128/64        Oxygen Initial Assessment:   Oxygen Re-Evaluation:   Oxygen Discharge (Final Oxygen Re-Evaluation):   Initial Exercise Prescription: Initial Exercise Prescription - 02/22/20 1100      Date of Initial Exercise RX and Referring Provider   Date  02/22/20    Referring Provider  Wyline Mood MD ([place])      Treadmill   MPH  3    Grade  0.5    Minutes  15    METs  3.5      Elliptical   Level  1    Speed  3.2    Minutes  15      REL-XR    Level  3    Speed  50    Minutes  15    METs  3      T5 Nustep   Level  3    SPM  80    Minutes  15  METs  3      Prescription Details   Frequency (times per week)  3    Duration  Progress to 30 minutes of continuous aerobic without signs/symptoms of physical distress      Intensity   THRR 40-80% of Max Heartrate  114-139    Ratings of Perceived Exertion  11-13    Perceived Dyspnea  0-4      Progression   Progression  Continue to progress workloads to maintain intensity without signs/symptoms of physical distress.      Resistance Training   Training Prescription  Yes    Weight  3 lb    Reps  10-15       Perform Capillary Blood Glucose checks as needed.  Exercise Prescription Changes: Exercise Prescription Changes    Row Name 02/22/20 1100 03/12/20 1300 03/25/20 1600 04/10/20 1500       Response to Exercise   Blood Pressure (Admit)  108/64  136/80  116/58  120/80    Blood Pressure (Exercise)  142/60  138/64  130/76  150/80    Blood Pressure (Exit)  128/64  116/64  118/62  110/58    Heart Rate (Admit)  88 bpm  73 bpm  83 bpm  81 bpm    Heart Rate (Exercise)  133 bpm  106 bpm  105 bpm  115 bpm    Heart Rate (Exit)  98 bpm  92 bpm  93 bpm  82 bpm    Oxygen Saturation (Admit)  97 %  --  --  --    Oxygen Saturation (Exercise)  98 %  --  --  --    Rating of Perceived Exertion (Exercise)  '8  11  12  11    '$ Symptoms  burning in chest from deeper breathing  --  --  --    Comments  walk test results  --  --  --    Duration  --  Continue with 30 min of aerobic exercise without signs/symptoms of physical distress.  Continue with 30 min of aerobic exercise without signs/symptoms of physical distress.  Continue with 30 min of aerobic exercise without signs/symptoms of physical distress.    Intensity  --  THRR unchanged  THRR unchanged  THRR unchanged      Progression   Progression  --  Continue to progress workloads to maintain intensity without signs/symptoms of physical  distress.  Continue to progress workloads to maintain intensity without signs/symptoms of physical distress.  Continue to progress workloads to maintain intensity without signs/symptoms of physical distress.    Average METs  --  --  3.86  3.9      Resistance Training   Training Prescription  --  Yes  Yes  Yes    Weight  --  3 lb  3 lb   4 lb    Reps  --  10-15  10-15  10-15      Interval Training   Interval Training  --  --  No  No      Treadmill   MPH  --  --  2.9  --    Grade  --  --  3  --    Minutes  --  --  15  --    METs  --  --  4.42  --      Recumbant Bike   Level  --  5  5  --    RPM  --  60  --  --  Watts  --  41  32  --    Minutes  --  15  15  --    METs  --  3.3  3.1  --      NuStep   Level  --  '5  4  5    '$ SPM  --  80  --  80    Minutes  --  '15  15  15    '$ METs  --  4.2  3  3.9      REL-XR   Level  --  --  6  --    Minutes  --  --  15  --    METs  --  --  6  --      Home Exercise Plan   Plans to continue exercise at  --  --  Home (comment) walking, staff videos  Home (comment) walking, staff videos    Frequency  --  --  Add 2 additional days to program exercise sessions.  Add 2 additional days to program exercise sessions.    Initial Home Exercises Provided  --  --  02/29/20  02/29/20       Exercise Comments:   Exercise Goals and Review: Exercise Goals    Row Name 02/22/20 1200             Exercise Goals   Increase Physical Activity  Yes       Intervention  Provide advice, education, support and counseling about physical activity/exercise needs.;Develop an individualized exercise prescription for aerobic and resistive training based on initial evaluation findings, risk stratification, comorbidities and participant's personal goals.       Expected Outcomes  Short Term: Attend rehab on a regular basis to increase amount of physical activity.;Long Term: Add in home exercise to make exercise part of routine and to increase amount of physical  activity.;Long Term: Exercising regularly at least 3-5 days a week.       Increase Strength and Stamina  Yes       Intervention  Provide advice, education, support and counseling about physical activity/exercise needs.;Develop an individualized exercise prescription for aerobic and resistive training based on initial evaluation findings, risk stratification, comorbidities and participant's personal goals.       Expected Outcomes  Short Term: Increase workloads from initial exercise prescription for resistance, speed, and METs.;Short Term: Perform resistance training exercises routinely during rehab and add in resistance training at home;Long Term: Improve cardiorespiratory fitness, muscular endurance and strength as measured by increased METs and functional capacity (6MWT)       Able to understand and use rate of perceived exertion (RPE) scale  Yes       Intervention  Provide education and explanation on how to use RPE scale       Expected Outcomes  Short Term: Able to use RPE daily in rehab to express subjective intensity level;Long Term:  Able to use RPE to guide intensity level when exercising independently       Able to understand and use Dyspnea scale  Yes       Intervention  Provide education and explanation on how to use Dyspnea scale       Expected Outcomes  Long Term: Able to use Dyspnea scale to guide intensity level when exercising independently;Short Term: Able to use Dyspnea scale daily in rehab to express subjective sense of shortness of breath during exertion       Knowledge and understanding of Target Heart Rate Range (  THRR)  Yes       Intervention  Provide education and explanation of THRR including how the numbers were predicted and where they are located for reference       Expected Outcomes  Short Term: Able to state/look up THRR;Long Term: Able to use THRR to govern intensity when exercising independently;Short Term: Able to use daily as guideline for intensity in rehab       Able  to check pulse independently  Yes       Intervention  Provide education and demonstration on how to check pulse in carotid and radial arteries.;Review the importance of being able to check your own pulse for safety during independent exercise       Expected Outcomes  Short Term: Able to explain why pulse checking is important during independent exercise;Long Term: Able to check pulse independently and accurately       Understanding of Exercise Prescription  Yes       Intervention  Provide education, explanation, and written materials on patient's individual exercise prescription       Expected Outcomes  Short Term: Able to explain program exercise prescription;Long Term: Able to explain home exercise prescription to exercise independently          Exercise Goals Re-Evaluation : Exercise Goals Re-Evaluation    Row Name 02/28/20 1033 03/08/20 0941 03/12/20 1325 03/25/20 1624 04/08/20 0949     Exercise Goal Re-Evaluation   Exercise Goals Review  Increase Physical Activity;Understanding of Exercise Prescription;Able to understand and use rate of perceived exertion (RPE) scale;Knowledge and understanding of Target Heart Rate Range (THRR);Increase Strength and Stamina;Able to check pulse independently  Increase Physical Activity;Increase Strength and Stamina;Understanding of Exercise Prescription  Increase Physical Activity;Increase Strength and Stamina;Able to understand and use rate of perceived exertion (RPE) scale;Able to understand and use Dyspnea scale;Knowledge and understanding of Target Heart Rate Range (THRR);Able to check pulse independently;Understanding of Exercise Prescription  Increase Physical Activity;Increase Strength and Stamina;Understanding of Exercise Prescription  Increase Physical Activity;Increase Strength and Stamina;Understanding of Exercise Prescription   Comments  Reviewed RPE scale, THR and program prescription with pt today.  Pt voiced understanding and was given a copy of  goals to take home.  Joshua Haley is off to a good start in rehab.  He can already tell it is speeding up his recovery and giving him more strength and movement.  Reviewed home exercise with pt today.  Pt plans to walk and use staff videos for exercise.  Reviewed THR, pulse, RPE, sign and symptoms, and when to call 911 or MD.  Also discussed weather considerations and indoor options.  Pt voiced understanding.  He hopes to join [employer] after graduation.  Joshua continues to progress well.  Staff will monitor progress.  Joshua Haley has been doing well in rehab.  He is now up to 37 watts on the recumbent bike.  We will continue to monitor his progress.  Joshua Haley is doing  well in rehab.  His shoulder has been bothering him and he asked to come off arm crank for time being.  He has been working more and not able to walk as much at home.  He is looking into retiring soon to get more time.   Expected Outcomes  Short: Use RPE daily to regulate intensity. Long: Follow program prescription in THR.  Short: Start to add in more walking at home.  Long: Continue to build stamina.  Short:  increase worklaods to reach RPE 11-13 Long: increase overall MET level  Short: Continue to increase workloads  Long: Conitnue to improve stamina.  Short: Add more walking back in  and talk to doctor about arm  Long: Continue to improve stamina.   Haley Name 04/10/20 1533             Exercise Goal Re-Evaluation   Exercise Goals Review  Increase Physical Activity;Increase Strength and Stamina;Able to understand and use rate of perceived exertion (RPE) scale;Able to understand and use Dyspnea scale;Knowledge and understanding of Target Heart Rate Range (THRR);Able to check pulse independently;Understanding of Exercise Prescription       Comments  Joshua Haley is making steady progress.  Staff will follow up about arm       Expected Outcomes  Short:  continue to attend consistently Long : improve overall MET level          Discharge Exercise  Prescription (Final Exercise Prescription Changes): Exercise Prescription Changes - 04/10/20 1500      Response to Exercise   Blood Pressure (Admit)  120/80    Blood Pressure (Exercise)  150/80    Blood Pressure (Exit)  110/58    Heart Rate (Admit)  81 bpm    Heart Rate (Exercise)  115 bpm    Heart Rate (Exit)  82 bpm    Rating of Perceived Exertion (Exercise)  11    Duration  Continue with 30 min of aerobic exercise without signs/symptoms of physical distress.    Intensity  THRR unchanged      Progression   Progression  Continue to progress workloads to maintain intensity without signs/symptoms of physical distress.    Average METs  3.9      Resistance Training   Training Prescription  Yes    Weight   4 lb    Reps  10-15      Interval Training   Interval Training  No      NuStep   Level  5    SPM  80    Minutes  15    METs  3.9      Home Exercise Plan   Plans to continue exercise at  Home (comment)   walking, staff videos   Frequency  Add 2 additional days to program exercise sessions.    Initial Home Exercises Provided  02/29/20       Nutrition:  Target Goals: Understanding of nutrition guidelines, daily intake of sodium '1500mg'$ , cholesterol '200mg'$ , calories 30% from fat and 7% or less from saturated fats, daily to have 5 or more servings of fruits and vegetables.  Education: Controlling Sodium/Reading Food Labels -Group verbal and written material supporting the discussion of sodium use in heart healthy nutrition. Review and explanation with models, verbal and written materials for utilization of the food label.   Education: General Nutrition Guidelines/Fats and Fiber: -Group instruction provided by verbal, written material, models and posters to present the general guidelines for heart healthy nutrition. Gives an explanation and review of dietary fats and fiber.   Biometrics: Pre Biometrics - 02/22/20 1201      Pre Biometrics   Height  6' 0.5" (1.842 m)     Weight  218 lb (98.9 kg)    BMI (Calculated)  29.14    Single Leg Stand  30 seconds        Nutrition Therapy Plan and Nutrition Goals: Nutrition Therapy & Goals - 02/27/20 1101      Nutrition Therapy   Diet  HH, DM, Low Na    Protein (specify units)  90-95g    Fiber  30 grams    Whole Grain Foods  3 servings    Saturated Fats  12 max. grams    Fruits and Vegetables  5 servings/day    Sodium  1.5 grams      Personal Nutrition Goals   Nutrition Goal  ST: water LT: weight maintenance    Comments  Not on insulin, last A1c 8, doesn't have new one yet. B: 2 eggs w/ some cheese and some spring mix - 2 oz of oatmeal, grilled chicken sometimes. Coffee w/ stevia. S: fruit  L:moes (beans, cilantro, salad) D: wraps with [place] and cheese with mustard (low carb wrap)S: low sugar ice cream Drinks: sugar free gatorade, sugar free ginger ale. Discussed HH and DM friendly eating. Suggested lowering Na, adding vegetables to dinner, adding protein or fat to fruit snack, discussed adding water.      Intervention Plan   Intervention  Prescribe, educate and counsel regarding individualized specific dietary modifications aiming towards targeted core components such as weight, hypertension, lipid management, diabetes, heart failure and other comorbidities.;Nutrition handout(s) given to patient.    Expected Outcomes  Short Term Goal: Understand basic principles of dietary content, such as calories, fat, sodium, cholesterol and nutrients.;Short Term Goal: A plan has been developed with personal nutrition goals set during dietitian appointment.;Long Term Goal: Adherence to prescribed nutrition plan.       Nutrition Assessments: Nutrition Assessments - 02/22/20 1204      MEDFICTS Scores   Pre Score  14       MEDIFICTS Score Key:          ?70 Need to make dietary changes          40-70 Heart Healthy Diet         ? 40 Therapeutic Level Cholesterol Diet  Nutrition Goals Re-Evaluation: Nutrition Goals  Re-Evaluation    [place] Name 04/11/20 1110             Goals   Nutrition Goal  ST: water LT: weight maintenance       Comment  continue with current changes       Expected Outcome  ST: water LT: weight maintenance          Nutrition Goals Discharge (Final Nutrition Goals Re-Evaluation): Nutrition Goals Re-Evaluation - 04/11/20 1110      Goals   Nutrition Goal  ST: water LT: weight maintenance    Comment  continue with current changes    Expected Outcome  ST: water LT: weight maintenance       Psychosocial: Target Goals: Acknowledge presence or absence of significant depression and/or stress, maximize coping skills, provide positive support system. Participant is able to verbalize types and ability to use techniques and skills needed for reducing stress and depression.   Education: Depression - Provides group verbal and written instruction on the correlation between heart/lung disease and depressed mood, treatment options, and the stigmas associated with seeking treatment.   Education: Sleep Hygiene -Provides group verbal and written instruction about how sleep can affect your health.  Define sleep hygiene, discuss sleep cycles and impact of sleep habits. Review good sleep hygiene tips.     Education: Stress and Anxiety: - Provides group verbal and written instruction about the health risks of elevated stress and causes of high stress.  Discuss the correlation between heart/lung disease and anxiety and treatment options. Review healthy ways to manage with stress and anxiety.    Initial Review & Psychosocial Screening: Initial  Psych Review & Screening - 02/21/20 5956      Initial Review   Current issues with  Current Sleep Concerns;Current Stress Concerns    Source of Stress Concerns  Chronic Illness;Financial    Comments  wears CPAP but gets at least 6 hours of sleep since using it, health some financial stressors but it is okay right now      [place]?  Yes   wife, Church family and pastor, prayers   Comments  Relies strongly on his [place], prayers, and church family      Barriers   Psychosocial barriers to participate in program  The patient should benefit from training in stress management and relaxation.;Psychosocial barriers identified (see note)      Screening Interventions   Interventions  Encouraged to exercise;Provide feedback about the scores to participant;To provide support and resources with identified psychosocial needs    Expected Outcomes  Short Term goal: Utilizing psychosocial counselor, staff and physician to assist with identification of specific Stressors or current issues interfering with healing process. Setting desired goal for each stressor or current issue identified.;Long Term Goal: Stressors or current issues are controlled or eliminated.;Short Term goal: Identification and review with participant of any Quality of Life or Depression concerns found by scoring the questionnaire.;Long Term goal: The participant improves quality of Life and PHQ9 Scores as seen by post scores and/or verbalization of changes       Quality of Life Scores:  Quality of Life - 02/22/20 1204      Quality of Life   Select  Quality of Life      Quality of Life Scores   Health/Function Pre  27.2 %    Socioeconomic Pre  26.75 %    Psych/Spiritual Pre  28.21 %    Family Pre  25.2 %    GLOBAL Pre  27.01 %      Scores of 19 and below usually indicate a poorer quality of life in these areas.  A difference of  2-3 points is a clinically meaningful difference.  A difference of 2-3 points in the total score of the Quality of Life Index has been associated with significant improvement in overall quality of life, self-image, physical symptoms, and general health in studies assessing change in quality of life.  PHQ-9: Recent Review Flowsheet Data    Depression screen [facility] 2/9 02/22/2020   Decreased Interest 0   Down,  Depressed, Hopeless 0   PHQ - 2 Score 0   Altered sleeping 0   Tired, decreased energy 0   Change in appetite 0   Feeling bad or failure about yourself  0   Trouble concentrating 0   Moving slowly or fidgety/restless 0   PHQ-9 Score 0   Difficult doing work/chores Not difficult at all     Interpretation of Total Score  Total Score Depression Severity:  1-4 = Minimal depression, 5-9 = Mild depression, 10-14 = Moderate depression, 15-19 = Moderately severe depression, 20-27 = Severe depression   Psychosocial Evaluation and Intervention: Psychosocial Evaluation - 02/21/20 0944      Psychosocial Evaluation & Interventions   Interventions  Stress management education;Encouraged to exercise with the program and follow exercise prescription    Comments  Joshua Haley is coming into rehab after CABG x2 and is being covered by the [place].  He gets most of his care and medical needs through the [place] [place].  He quit smoking in 2011 and doing well with  it.  He has a history of diabetes and is under pretty good control.   No history of depression or anxiety.  He has a stronger support system in his wife and church family.  They have had some financial scares during the past year but have gotten it back under control.  He would like to return to work in some compacity but not sure what he wants to do.  He wants to get healthier and work on his diet so that he is able to work again.    Expected Outcomes  Short: Attend rehab regularly to build up stamina.  Long: Build up stamina to be able to return to work.    Continue Psychosocial Services   Follow up required by staff       Psychosocial Re-Evaluation: Psychosocial Re-Evaluation    Row Name 03/08/20 0934 04/08/20 0951           Psychosocial Re-Evaluation   Current issues with  Current Stress Concerns;Current Sleep Concerns  Current Stress Concerns;Current Sleep Concerns      Comments  Joshua Haley has been doing well in rehab.  He has enjoyed coming and getting his  exercise and meeting his classmates.  He is getting his 6 hours of sleep and using his CPAP but it does dry him out.  He wishes he could sleep better with it.  Overall, things are good.  Joshua Haley is starting to consider retiring earlier as he has been required to work more and misses his time off. This has been his primary stressor.  He is sleeping well and uses CPAP nightly to be able to sleep.  He gets about 4-7 hours each night.  He uses a strap to keep it on well.      Expected Outcomes  Short: Continue to improve sleep and review sleep info and video.  Long: Continue to stay positive.  Short: Continue to improve sleep night Long: Continue to look forward to retirement      Interventions  Stress management education;Encouraged to attend Cardiac Rehabilitation for the exercise  Stress management education;Encouraged to attend Cardiac Rehabilitation for the exercise      Continue Psychosocial Services   Follow up required by staff  --         Psychosocial Discharge (Final Psychosocial Re-Evaluation): Psychosocial Re-Evaluation - 04/08/20 0951      Psychosocial Re-Evaluation   Current issues with  Current Stress Concerns;Current Sleep Concerns    Comments  Joshua Haley is starting to consider retiring earlier as he has been required to work more and misses his time off. This has been his primary stressor.  He is sleeping well and uses CPAP nightly to be able to sleep.  He gets about 4-7 hours each night.  He uses a strap to keep it on well.    Expected Outcomes  Short: Continue to improve sleep night Long: Continue to look forward to retirement    Interventions  Stress management education;Encouraged to attend Cardiac Rehabilitation for the exercise       Vocational Rehabilitation: Provide vocational rehab assistance to qualifying candidates.   Vocational Rehab Evaluation & Intervention: Vocational Rehab - 02/21/20 6222      Initial Vocational Rehab Evaluation & Intervention   Assessment shows  need for Vocational Rehabilitation  --   not sure yet      Education: Education Goals: Education classes will be provided on a variety of topics geared toward better understanding of heart health and risk factor modification. Participant will  state understanding/return demonstration of topics presented as noted by education test scores.  Learning Barriers/Preferences: Learning Barriers/Preferences - 02/21/20 2751      Learning Barriers/Preferences   Learning Barriers  Sight   glasses   Learning Preferences  Skilled Demonstration;Written Material       General Cardiac Education Topics:  AED/CPR: - Group verbal and written instruction with the use of models to demonstrate the basic use of the AED with the basic ABC's of resuscitation.   Anatomy & Physiology of the Heart: - Group verbal and written instruction and models provide basic cardiac anatomy and physiology, with the coronary electrical and arterial systems. Review of Valvular disease and Heart Failure   Cardiac Procedures: - Group verbal and written instruction to review commonly prescribed medications for heart disease. Reviews the medication, class of the drug, and side effects. Includes the steps to properly store meds and maintain the prescription regimen. (beta blockers and nitrates)   Cardiac Medications I: - Group verbal and written instruction to review commonly prescribed medications for heart disease. Reviews the medication, class of the drug, and side effects. Includes the steps to properly store meds and maintain the prescription regimen.   Cardiac Medications II: -Group verbal and written instruction to review commonly prescribed medications for heart disease. Reviews the medication, class of the drug, and side effects. (all other drug classes)    Go Sex-Intimacy & Heart Disease, Get SMART - Goal Setting: - Group verbal and written instruction through game format to discuss heart disease and the return to  sexual intimacy. Provides group verbal and written material to discuss and apply goal setting through the application of the S.M.A.R.T. Method.   Other Matters of the Heart: - Provides group verbal, written materials and models to describe Stable Angina and Peripheral Artery. Includes description of the disease process and treatment options available to the cardiac patient.   Infection Prevention: - Provides verbal and written material to individual with discussion of infection control including proper hand washing and proper equipment cleaning during exercise session.   Cardiac Rehab from 02/22/2020 in [facility] Cardiac and Pulmonary Rehab  Date  02/22/20  Educator  [facility]  Instruction Review Code  1- Verbalizes Understanding      Falls Prevention: - Provides verbal and written material to individual with discussion of falls prevention and safety.   Cardiac Rehab from 02/22/2020 in [facility] Cardiac and Pulmonary Rehab  Date  02/22/20  Educator  [facility]  Instruction Review Code  1- Verbalizes Understanding      Other: -Provides group and verbal instruction on various topics (see comments)   Knowledge Questionnaire Score: Knowledge Questionnaire Score - 02/22/20 1204      Knowledge Questionnaire Score   Pre Score  24/26 Education Focus: Nutrition, Depression       Core Components/Risk Factors/Patient Goals at Admission: Personal Goals and Risk Factors at Admission - 02/22/20 1205      Core Components/Risk Factors/Patient Goals on Admission    Weight Management  Yes;Weight Loss    Intervention  Weight Management: Develop a combined nutrition and exercise program designed to reach desired caloric intake, while maintaining appropriate intake of nutrient and fiber, sodium and fats, and appropriate energy expenditure required for the weight goal.;Weight Management: Provide education and appropriate resources to help participant work on and attain dietary goals.    Admit Weight  218 lb (98.9 kg)     Goal Weight: Short Term  213 lb (96.6 kg)    Goal Weight: Long Term  200 lb (90.7 kg)    Expected Outcomes  Short Term: Continue to assess and modify interventions until short term weight is achieved;Long Term: Adherence to nutrition and physical activity/exercise program aimed toward attainment of established weight goal;Weight Loss: Understanding of general recommendations for a balanced deficit meal plan, which promotes 1-2 lb weight loss per week and includes a negative energy balance of [phone] kcal/d;Understanding recommendations for meals to include 15-35% energy as protein, 25-35% energy from fat, 35-60% energy from carbohydrates, less than '200mg'$  of dietary cholesterol, 20-35 gm of total fiber daily;Understanding of distribution of calorie intake throughout the day with the consumption of 4-5 meals/snacks    Diabetes  Yes    Intervention  Provide education about signs/symptoms and action to take for hypo/hyperglycemia.;Provide education about proper nutrition, including hydration, and aerobic/resistive exercise prescription along with prescribed medications to achieve blood glucose in normal ranges: Fasting glucose 65-99 mg/dL    Expected Outcomes  Short Term: Participant verbalizes understanding of the signs/symptoms and immediate care of hyper/hypoglycemia, proper foot care and importance of medication, aerobic/resistive exercise and nutrition plan for blood glucose control.;Long Term: Attainment of HbA1C < 7%.    Hypertension  Yes    Intervention  Provide education on lifestyle modifcations including regular physical activity/exercise, weight management, moderate sodium restriction and increased consumption of fresh fruit, vegetables, and low fat dairy, alcohol moderation, and smoking cessation.;Monitor prescription use compliance.    Expected Outcomes  Short Term: Continued assessment and intervention until BP is < 140/65m HG in hypertensive participants. < 130/882mHG in hypertensive  participants with diabetes, heart failure or chronic kidney disease.;Long Term: Maintenance of blood pressure at goal levels.    Lipids  Yes    Intervention  Provide education and support for participant on nutrition & aerobic/resistive exercise along with prescribed medications to achieve LDL '70mg'$ , HDL >'40mg'$ .    Expected Outcomes  Short Term: Participant states understanding of desired cholesterol values and is compliant with medications prescribed. Participant is following exercise prescription and nutrition guidelines.;Long Term: Cholesterol controlled with medications as prescribed, with individualized exercise RX and with personalized nutrition plan. Value goals: LDL < '70mg'$ , HDL > 40 mg.       Education:Diabetes - Individual verbal and written instruction to review signs/symptoms of diabetes, desired ranges of glucose level fasting, after meals and with exercise. Acknowledge that pre and post exercise glucose checks will be done for 3 sessions at entry of program.   [facility]  Date  02/21/20  [facility]  1- Verbalizes Understanding      Education: Know Your Numbers and Risk Factors: -Group verbal and written instruction about important numbers in your health.  Discussion of what are risk factors and how they play a role in the disease process.  Review of Cholesterol, Blood Pressure, Diabetes, and BMI and the role they play in your overall health.   Core Components/Risk Factors/Patient Goals Review:  Goals and Risk Factor Review    Row Name 03/08/20 0939 04/08/20 0954           Core Components/Risk Factors/Patient Goals Review   Personal Goals Review  Weight Management/Obesity;Hypertension;Diabetes  Weight Management/Obesity;Hypertension;Diabetes      Review  RiDelfino Lovetts doing well and losing weight.  He is down to 209lb but would like to lose more.  He really wants to lose more!!  He is working on diet  and exercise.  His pressures have been good,  meds are working!  Blood sugars are doing okay, 186 today after OJ last night when he was low. He will continue to work on this.  Joshua Haley is doing well in rehab.  He is up some today as he had [place] last night today he was 223llb but he is trying.  Getting in his [place] in monthly.  His sugars are doing well overall, a little high today from eating out, but overall good around 140s.  His pressure was up today from extra salt yesterday.  Overall, it is doing well.  He is still checking his blood pressures and sugars at home.  His wife keeps him going and on task.      Expected Outcomes  Short: Continue to work on diabetes management and weight loss  Long: Continue to monitor risk factors.  Short: Continue to get weight back down.  Long: Continue to monitor risk factors.         Core Components/Risk Factors/Patient Goals at Discharge (Final Review):  Goals and Risk Factor Review - 04/08/20 0954      Core Components/Risk Factors/Patient Goals Review   Personal Goals Review  Weight Management/Obesity;Hypertension;Diabetes    Review  Joshua Haley is doing well in rehab.  He is up some today as he had [place] last night today he was 223llb but he is trying.  Getting in his [place] in monthly.  His sugars are doing well overall, a little high today from eating out, but overall good around 140s.  His pressure was up today from extra salt yesterday.  Overall, it is doing well.  He is still checking his blood pressures and sugars at home.  His wife keeps him going and on task.    Expected Outcomes  Short: Continue to get weight back down.  Long: Continue to monitor risk factors.       ITP Comments: ITP Comments    Row Name 02/21/20 0957 02/22/20 1154 02/27/20 1114 02/28/20 1031 03/20/20 0617   ITP Comments  Completed virtual orientation today.  EP evaluation is scheduled for Thursday 2/25  at 930am.  Documentation for diagnosis can be found in CE encounter 01/26/20.   Completed 6MWT and gym orientation.  Initial ITP created and sent for review to Dr. Emily Filbert, Medical Director.  Completed Initial RD Eval  First full day of exercise!  Patient was oriented to gym and equipment including functions, settings, policies, and procedures.  Patient's individual exercise prescription and treatment plan were reviewed.  All starting workloads were established based on the results of the 6 minute walk test done at initial orientation visit.  The plan for exercise progression was also introduced and progression will be customized based on patient's performance and goals  30 day chart review completed. ITP sent to Dr Zachery Dakins Medical Director, for review,changes as needed and signature. Continue with ITP if no changes requested   [place] Name 04/17/20 0601           ITP Comments  30 Day review completed. Medical Director review done, changes made as directed,and approval shown by signature of Market researcher.          Comments:

## 2020-04-17 NOTE — Progress Notes (Signed)
Daily Session Note  Patient Details  Name: Joshua Haley MRN: [id number] Date of Birth: December 31, 1950 Referring Provider:     Cardiac Rehab from 02/22/2020 in [facility] Cardiac and Pulmonary Rehab  Referring Provider  Wyline Mood MD ([place])      Encounter Date: 04/17/2020  Check In: Session Check In - 04/17/20 0953      Check-In   Supervising physician immediately available to respond to emergencies  See telemetry face sheet for immediately available ER MD    Location  [facility]-Cardiac & Pulmonary Rehab    Staff Present  Heath Lark, RN, BSN, Lance Sell, BA, ACSM CEP, Exercise Physiologist;Joseph Tessie Fass RCP,RRT,BSRT    Virtual Visit  No    Medication changes reported      No    Fall or balance concerns reported     No    Warm-up and Cool-down  Performed on first and last piece of equipment    Resistance Training Performed  Yes    VAD Patient?  No    PAD/SET Patient?  No      Pain Assessment   Currently in Pain?  No/denies          Social History   Tobacco Use  Smoking Status Former Smoker  Smokeless Tobacco Never Used    Goals Met:  Independence with exercise equipment Exercise tolerated well No report of cardiac concerns or symptoms  Goals Unmet:  Not Applicable  Comments: Pt able to follow exercise prescription today without complaint.  Will continue to monitor for progression.    Dr. Emily Filbert is Medical Director for [place] and [facility].

## 2020-04-19 ENCOUNTER — Encounter: Payer: No Typology Code available for payment source | Admitting: *Deleted

## 2020-04-19 ENCOUNTER — Other Ambulatory Visit: Payer: Self-pay

## 2020-04-19 DIAGNOSIS — Z951 Presence of aortocoronary bypass graft: Secondary | ICD-10-CM | POA: Diagnosis not present

## 2020-04-19 NOTE — Progress Notes (Signed)
Daily Session Note  Patient Details  Name: Joshua Haley MRN: [id number] Date of Birth: May 22, 1951 Referring Provider:     Cardiac Rehab from 02/22/2020 in [facility] Cardiac and Pulmonary Rehab  Referring Provider  Wyline Mood MD ([place])      Encounter Date: 04/19/2020  Check In: Session Check In - 04/19/20 0951      Check-In   Supervising physician immediately available to respond to emergencies  See telemetry face sheet for immediately available ER MD    Staff Present  Alberteen Sam, MA, RCEP, CCRP, CCET;Joseph Hood RCP,RRT,BSRT;Melissa [place] RDN, LDN;Meredith [place], RN BSN;Other   Basilia Jumbo RN, BSN   Virtual Visit  No    Medication changes reported      No    Fall or balance concerns reported     No    Warm-up and Cool-down  Performed on first and last piece of equipment    Resistance Training Performed  Yes    VAD Patient?  No    PAD/SET Patient?  No      Pain Assessment   Currently in Pain?  No/denies          Social History   Tobacco Use  Smoking Status Former Smoker  Smokeless Tobacco Never Used    Goals Met:  Independence with exercise equipment Exercise tolerated well No report of cardiac concerns or symptoms  Goals Unmet:  Not Applicable  Comments: Pt able to follow exercise prescription today without complaint.  Will continue to monitor for progression.    Dr. Emily Filbert is Medical Director for [place] and [facility].

## 2020-04-22 ENCOUNTER — Encounter: Payer: No Typology Code available for payment source | Admitting: *Deleted

## 2020-04-22 ENCOUNTER — Other Ambulatory Visit: Payer: Self-pay

## 2020-04-22 DIAGNOSIS — Z951 Presence of aortocoronary bypass graft: Secondary | ICD-10-CM | POA: Diagnosis not present

## 2020-04-22 NOTE — Progress Notes (Signed)
Daily Session Note  Patient Details  Name: Joshua Haley MRN: [id number] Date of Birth: [birth year] Referring Provider:     Cardiac Rehab from 02/22/2020 in [facility] Cardiac and Pulmonary Rehab  Referring Provider  Wyline Mood MD ([place])      Encounter Date: 04/22/2020  Check In: Session Check In - 04/22/20 0954      Check-In   Supervising physician immediately available to respond to emergencies  See telemetry face sheet for immediately available ER MD    Location  [facility]-Cardiac & Pulmonary Rehab    Staff Present  Heath Lark, RN, BSN, Laveda Norman, BS, ACSM CEP, Exercise Physiologist;Joseph Tessie Fass RCP,RRT,BSRT    Virtual Visit  No    Medication changes reported      No    Fall or balance concerns reported     No    Warm-up and Cool-down  Performed on first and last piece of equipment    Resistance Training Performed  Yes    VAD Patient?  No    PAD/SET Patient?  No      Pain Assessment   Currently in Pain?  No/denies          Social History   Tobacco Use  Smoking Status Former Smoker  Smokeless Tobacco Never Used    Goals Met:  Independence with exercise equipment Exercise tolerated well No report of cardiac concerns or symptoms  Goals Unmet:  Not Applicable  Comments: Pt able to follow exercise prescription today without complaint.  Will continue to monitor for progression.    Dr. Emily Filbert is Medical Director for [place] and [facility].

## 2020-04-24 ENCOUNTER — Other Ambulatory Visit: Payer: Self-pay

## 2020-04-24 ENCOUNTER — Encounter: Payer: No Typology Code available for payment source | Admitting: *Deleted

## 2020-04-24 DIAGNOSIS — Z951 Presence of aortocoronary bypass graft: Secondary | ICD-10-CM

## 2020-04-24 NOTE — Progress Notes (Signed)
Daily Session Note  Patient Details  Name: Joshua Haley MRN: [id number] Date of Birth: 12-18-1951 Referring Provider:     Cardiac Rehab from 02/22/2020 in [facility] Cardiac and Pulmonary Rehab  Referring Provider  Wyline Mood MD ([place])      Encounter Date: 04/24/2020  Check In: Session Check In - 04/24/20 0955      Check-In   Supervising physician immediately available to respond to emergencies  See telemetry face sheet for immediately available ER MD    Location  [facility]-Cardiac & Pulmonary Rehab    Staff Present  Nada Maclachlan, BA, ACSM CEP, Exercise Physiologist;Ginny Loomer, RN, BSN, CCRP;Joseph Hood RCP,RRT,BSRT    Virtual Visit  No    Medication changes reported      No    Fall or balance concerns reported     No    Warm-up and Cool-down  Performed on first and last piece of equipment    Resistance Training Performed  Yes    VAD Patient?  No      Pain Assessment   Currently in Pain?  No/denies          Social History   Tobacco Use  Smoking Status Former Smoker  Smokeless Tobacco Never Used    Goals Met:  Independence with exercise equipment Exercise tolerated well No report of cardiac concerns or symptoms  Goals Unmet:  Not Applicable  Comments: Pt able to follow exercise prescription today without complaint.  Will continue to monitor for progression.    Dr. Emily Filbert is Medical Director for [place] and [facility].

## 2020-04-26 ENCOUNTER — Other Ambulatory Visit: Payer: Self-pay

## 2020-04-26 DIAGNOSIS — Z951 Presence of aortocoronary bypass graft: Secondary | ICD-10-CM

## 2020-04-26 NOTE — Progress Notes (Signed)
Daily Session Note  Patient Details  Name: Joshua Haley MRN: [id number] Date of Birth: December 25, 1951 Referring Provider:     Cardiac Rehab from 02/22/2020 in [facility] Cardiac and Pulmonary Rehab  Referring Provider  Wyline Mood MD ([place])      Encounter Date: 04/26/2020  Check In: Session Check In - 04/26/20 0944      Check-In   Supervising physician immediately available to respond to emergencies  See telemetry face sheet for immediately available ER MD    Location  [facility]-Cardiac & Pulmonary Rehab    Staff Present  Vida Rigger RN, BSN;Jessica Luan Pulling, MA, RCEP, CCRP, CCET;Joseph Hood RCP,RRT,BSRT    Virtual Visit  No    Medication changes reported      No    Fall or balance concerns reported     No    Warm-up and Cool-down  Performed on first and last piece of equipment    Resistance Training Performed  Yes    VAD Patient?  No    PAD/SET Patient?  No      Pain Assessment   Currently in Pain?  No/denies          Social History   Tobacco Use  Smoking Status Former Smoker  Smokeless Tobacco Never Used    Goals Met:  Independence with exercise equipment Exercise tolerated well No report of cardiac concerns or symptoms Strength training completed today  Goals Unmet:  Not Applicable  Comments: Pt able to follow exercise prescription today without complaint.  Will continue to monitor for progression.   Dr. Emily Filbert is Medical Director for [place] and [facility].

## 2020-04-29 ENCOUNTER — Other Ambulatory Visit: Payer: Self-pay

## 2020-04-29 ENCOUNTER — Encounter: Payer: No Typology Code available for payment source | Attending: Internal Medicine | Admitting: *Deleted

## 2020-04-29 DIAGNOSIS — I2510 Atherosclerotic heart disease of native coronary artery without angina pectoris: Secondary | ICD-10-CM | POA: Diagnosis not present

## 2020-04-29 DIAGNOSIS — Z951 Presence of aortocoronary bypass graft: Secondary | ICD-10-CM

## 2020-04-29 NOTE — Progress Notes (Signed)
Daily Session Note  Patient Details  Name: MIRACLE CRIADO MRN: [id number] Date of Birth: 1951/11/22 Referring Provider:     Cardiac Rehab from 02/22/2020 in [facility] Cardiac and Pulmonary Rehab  Referring Provider  Wyline Mood MD ([place])      Encounter Date: 04/29/2020  Check In: Session Check In - 04/29/20 0934      Check-In   Supervising physician immediately available to respond to emergencies  See telemetry face sheet for immediately available ER MD    Location  [facility]-Cardiac & Pulmonary Rehab    Staff Present  Heath Lark, RN, BSN, CCRP;Joseph Hood RCP,RRT,BSRT;Kelly [place], [place], ACSM CEP, Exercise Physiologist    Virtual Visit  No    Medication changes reported      No    Fall or balance concerns reported     No    Warm-up and Cool-down  Performed on first and last piece of equipment    Resistance Training Performed  Yes    VAD Patient?  No    PAD/SET Patient?  No      Pain Assessment   Currently in Pain?  No/denies          Social History   Tobacco Use  Smoking Status Former Smoker  Smokeless Tobacco Never Used    Goals Met:  Independence with exercise equipment Exercise tolerated well No report of cardiac concerns or symptoms  Goals Unmet:  Not Applicable  Comments: Pt able to follow exercise prescription today without complaint.  Will continue to monitor for progression.    Dr. Emily Filbert is Medical Director for [place] and [facility].

## 2020-05-01 ENCOUNTER — Encounter: Payer: No Typology Code available for payment source | Admitting: *Deleted

## 2020-05-01 ENCOUNTER — Other Ambulatory Visit: Payer: Self-pay

## 2020-05-01 DIAGNOSIS — Z951 Presence of aortocoronary bypass graft: Secondary | ICD-10-CM

## 2020-05-01 NOTE — Progress Notes (Signed)
Daily Session Note  Patient Details  Name: Joshua Haley MRN: [id number] Date of Birth: [birth year] Referring Provider:     Cardiac Rehab from 02/22/2020 in [facility] Cardiac and Pulmonary Rehab  Referring Provider  Wyline Mood MD ([place])      Encounter Date: 05/01/2020  Check In: Session Check In - 05/01/20 0944      Check-In   Supervising physician immediately available to respond to emergencies  See telemetry face sheet for immediately available ER MD    Location  [facility]-Cardiac & Pulmonary Rehab    Staff Present  Alberteen Sam, MA, RCEP, CCRP, CCET;Amanda Sommer, BA, ACSM CEP, Exercise Physiologist;Melissa Fremont RDN, LDN;Joseph Hood RCP,RRT,BSRT;Other   Basilia Jumbo RN, BSN   Virtual Visit  No    Medication changes reported      No    Fall or balance concerns reported     No    Warm-up and Cool-down  Performed on first and last piece of equipment    Resistance Training Performed  Yes    VAD Patient?  No    PAD/SET Patient?  No      Pain Assessment   Currently in Pain?  No/denies          Social History   Tobacco Use  Smoking Status Former Smoker  Smokeless Tobacco Never Used    Goals Met:  Independence with exercise equipment Exercise tolerated well No report of cardiac concerns or symptoms  Goals Unmet:  Not Applicable  Comments: Pt able to follow exercise prescription today without complaint.  Will continue to monitor for progression.    Dr. Emily Filbert is Medical Director for [facility] and [facility].

## 2020-05-03 ENCOUNTER — Encounter: Payer: No Typology Code available for payment source | Admitting: *Deleted

## 2020-05-03 ENCOUNTER — Other Ambulatory Visit: Payer: Self-pay

## 2020-05-03 DIAGNOSIS — Z951 Presence of aortocoronary bypass graft: Secondary | ICD-10-CM | POA: Diagnosis not present

## 2020-05-03 NOTE — Progress Notes (Signed)
Daily Session Note  Patient Details  Name: Joshua Haley MRN: [id number] Date of Birth: [birth year] Referring Provider:     Cardiac Rehab from 02/22/2020 in [facility] Cardiac and Pulmonary Rehab  Referring Provider  Wyline Mood MD ([place])      Encounter Date: 05/03/2020  Check In: Session Check In - 05/03/20 0948      Check-In   Supervising physician immediately available to respond to emergencies  See telemetry face sheet for immediately available ER MD    Location  [facility]-Cardiac & Pulmonary Rehab    Staff Present  Renita Papa, RN BSN;Joseph [address] [place], [place], RCEP, CCRP, CCET    Virtual Visit  No    Medication changes reported      No    Fall or balance concerns reported     No    Warm-up and Cool-down  Performed on first and last piece of equipment    Resistance Training Performed  Yes    VAD Patient?  No    PAD/SET Patient?  No      Pain Assessment   Currently in Pain?  No/denies          Social History   Tobacco Use  Smoking Status Former Smoker  Smokeless Tobacco Never Used    Goals Met:  Independence with exercise equipment Exercise tolerated well No report of cardiac concerns or symptoms Strength training completed today  Goals Unmet:  Not Applicable  Comments: Pt able to follow exercise prescription today without complaint.  Will continue to monitor for progression.    Dr. Emily Filbert is Medical Director for [facility] and [facility].

## 2020-05-06 ENCOUNTER — Encounter: Payer: No Typology Code available for payment source | Admitting: *Deleted

## 2020-05-06 ENCOUNTER — Other Ambulatory Visit: Payer: Self-pay

## 2020-05-06 DIAGNOSIS — Z951 Presence of aortocoronary bypass graft: Secondary | ICD-10-CM | POA: Diagnosis not present

## 2020-05-06 NOTE — Progress Notes (Signed)
Daily Session Note  Patient Details  Name: Joshua Haley MRN: [id number] Date of Birth: 12/05/1951 Referring Provider:     Cardiac Rehab from 02/22/2020 in [facility] Cardiac and Pulmonary Rehab  Referring Provider  Wyline Mood MD ([place])      Encounter Date: 05/06/2020  Check In: Session Check In - 05/06/20 1132      Check-In   Supervising physician immediately available to respond to emergencies  See telemetry face sheet for immediately available ER MD    Location  [facility]-Cardiac & Pulmonary Rehab    Staff Present  Earlean Shawl, BS, ACSM CEP, Exercise Physiologist;Susanne Bice, RN, BSN, CCRP;Joseph Hood RCP,RRT,BSRT;Amanda Oletta Darter, [place], ACSM CEP, Exercise Physiologist    Virtual Visit  No    Medication changes reported      No    Fall or balance concerns reported     No    Warm-up and Cool-down  Performed on first and last piece of equipment    Resistance Training Performed  Yes    VAD Patient?  No    PAD/SET Patient?  No      Pain Assessment   Currently in Pain?  No/denies          Social History   Tobacco Use  Smoking Status Former Smoker  Smokeless Tobacco Never Used    Goals Met:  Independence with exercise equipment Exercise tolerated well No report of cardiac concerns or symptoms Strength training completed today  Goals Unmet:  Not Applicable  Comments: Pt able to follow exercise prescription today without complaint.  Will continue to monitor for progression.    Dr. Emily Filbert is Medical Director for [facility] and [facility].

## 2020-05-08 ENCOUNTER — Encounter: Payer: No Typology Code available for payment source | Admitting: *Deleted

## 2020-05-08 ENCOUNTER — Other Ambulatory Visit: Payer: Self-pay

## 2020-05-08 VITALS — Ht 72.5 in | Wt 229.3 lb

## 2020-05-08 DIAGNOSIS — Z951 Presence of aortocoronary bypass graft: Secondary | ICD-10-CM | POA: Diagnosis not present

## 2020-05-08 NOTE — Progress Notes (Signed)
Daily Session Note  Patient Details  Name: Joshua Haley MRN: [id number] Date of Birth: 04/07/1951 Referring Provider:     Cardiac Rehab from 02/22/2020 in [facility] Cardiac and Pulmonary Rehab  Referring Provider  Howard, Athena [facility] ([facility]      Encounter Date: 05/08/2020  Check In: Session Check In - 05/08/20 1040      Check-In   Supervising physician immediately available to respond to emergencies  See telemetry face sheet for immediately available ER MD    Location  [facility]-Cardiac & Pulmonary Rehab    Staff Present  Jessica Hawkins, MA, RCEP, CCRP, CCET;Joseph Hood RCP,RRT,BSRT;Susanne Bice, RN, BSN, CCRP    Virtual Visit  No    Medication changes reported      No    Fall or balance concerns reported     No    Warm-up and Cool-down  Performed on first and last piece of equipment    Resistance Training Performed  Yes    VAD Patient?  No    PAD/SET Patient?  No      Pain Assessment   Currently in Pain?  No/denies          Social History   Tobacco Use  Smoking Status Former Smoker  Smokeless Tobacco Never Used    Goals Met:  Independence with exercise equipment Exercise tolerated well No report of cardiac concerns or symptoms Strength training completed today  Goals Unmet:  Not Applicable  Comments: Pt able to follow exercise prescription today without complaint.  Will continue to monitor for progression.    Dr. Mark Miller is Medical Director for [facility] and [facility]. 

## 2020-05-10 ENCOUNTER — Other Ambulatory Visit: Payer: Self-pay

## 2020-05-10 ENCOUNTER — Encounter: Payer: No Typology Code available for payment source | Admitting: *Deleted

## 2020-05-10 DIAGNOSIS — Z951 Presence of aortocoronary bypass graft: Secondary | ICD-10-CM | POA: Diagnosis not present

## 2020-05-10 NOTE — Progress Notes (Signed)
Daily Session Note  Patient Details  Name: Joshua Haley MRN: [id number] Date of Birth: Oct 08, 1951 Referring Provider:     Cardiac Rehab from 02/22/2020 in [facility] Cardiac and Pulmonary Rehab  Referring Provider  Wyline Mood MD ([place])      Encounter Date: 05/10/2020  Check In: Session Check In - 05/10/20 0946      Check-In   Supervising physician immediately available to respond to emergencies  See telemetry face sheet for immediately available ER MD    Location  [facility]-Cardiac & Pulmonary Rehab    Staff Present  Basilia Jumbo, RN, BSN;Meredith Sherryll Burger, RN BSN;Joseph [address] [place], [place], [place], CCRP, CCET    Virtual Visit  No    Medication changes reported      No    Fall or balance concerns reported     No    Warm-up and Cool-down  Performed on first and last piece of equipment    Resistance Training Performed  Yes    VAD Patient?  No    PAD/SET Patient?  No      Pain Assessment   Currently in Pain?  No/denies          Social History   Tobacco Use  Smoking Status Former Smoker  Smokeless Tobacco Never Used    Goals Met:  Independence with exercise equipment Exercise tolerated well No report of cardiac concerns or symptoms  Goals Unmet:  Not Applicable  Comments: Pt able to follow exercise prescription today without complaint.  Will continue to monitor for progression.   **EKG changes noted during therapy from prior EKGs. Patient denies any chest pain or other cardiac symptoms throughout duration of therapy. Patient did report some stress in regards to some family matters. Patient's heart rate 80's-90's and SBP 120-140s. Patient educated to seek medical provider if cardiac symptoms occur. Sending to MD.**   Dr. Emily Filbert is Medical Director for [facility] and [facility].

## 2020-05-13 ENCOUNTER — Encounter: Payer: No Typology Code available for payment source | Admitting: *Deleted

## 2020-05-13 ENCOUNTER — Other Ambulatory Visit: Payer: Self-pay

## 2020-05-13 DIAGNOSIS — Z951 Presence of aortocoronary bypass graft: Secondary | ICD-10-CM | POA: Diagnosis not present

## 2020-05-13 NOTE — Progress Notes (Signed)
Daily Session Note  Patient Details  Name: Joshua Haley MRN: [id number] Date of Birth: 1951-05-17 Referring Provider:     Cardiac Rehab from 02/22/2020 in [facility] Cardiac and Pulmonary Rehab  Referring Provider  Wyline Mood MD ([place])      Encounter Date: 05/13/2020  Check In: Session Check In - 05/13/20 1027      Check-In   Supervising physician immediately available to respond to emergencies  See telemetry face sheet for immediately available ER MD    Location  [facility]-Cardiac & Pulmonary Rehab    Staff Present  Heath Lark, RN, BSN, Laveda Norman, BS, ACSM CEP, Exercise Physiologist;Joseph Tessie Fass RCP,RRT,BSRT    Virtual Visit  No    Medication changes reported      No    Fall or balance concerns reported     No    Warm-up and Cool-down  Performed on first and last piece of equipment    Resistance Training Performed  Yes    VAD Patient?  No    PAD/SET Patient?  No      Pain Assessment   Currently in Pain?  No/denies          Social History   Tobacco Use  Smoking Status Former Smoker  Smokeless Tobacco Never Used    Goals Met:  Independence with exercise equipment Exercise tolerated well No report of cardiac concerns or symptoms  Goals Unmet:  Not Applicable  Comments: Pt able to follow exercise prescription today without complaint.  Will continue to monitor for progression.    Dr. Emily Filbert is Medical Director for [place] and [facility].

## 2020-05-15 ENCOUNTER — Encounter: Payer: No Typology Code available for payment source | Admitting: *Deleted

## 2020-05-15 ENCOUNTER — Other Ambulatory Visit: Payer: Self-pay

## 2020-05-15 ENCOUNTER — Emergency Department: Payer: No Typology Code available for payment source

## 2020-05-15 ENCOUNTER — Encounter: Payer: Self-pay | Admitting: *Deleted

## 2020-05-15 DIAGNOSIS — Z7984 Long term (current) use of oral hypoglycemic drugs: Secondary | ICD-10-CM | POA: Diagnosis not present

## 2020-05-15 DIAGNOSIS — Z79899 Other long term (current) drug therapy: Secondary | ICD-10-CM | POA: Insufficient documentation

## 2020-05-15 DIAGNOSIS — E119 Type 2 diabetes mellitus without complications: Secondary | ICD-10-CM | POA: Insufficient documentation

## 2020-05-15 DIAGNOSIS — R9431 Abnormal electrocardiogram [ECG] [EKG]: Secondary | ICD-10-CM | POA: Diagnosis not present

## 2020-05-15 DIAGNOSIS — I10 Essential (primary) hypertension: Secondary | ICD-10-CM | POA: Diagnosis not present

## 2020-05-15 DIAGNOSIS — Z951 Presence of aortocoronary bypass graft: Secondary | ICD-10-CM

## 2020-05-15 DIAGNOSIS — Z87891 Personal history of nicotine dependence: Secondary | ICD-10-CM | POA: Insufficient documentation

## 2020-05-15 DIAGNOSIS — E039 Hypothyroidism, unspecified: Secondary | ICD-10-CM | POA: Insufficient documentation

## 2020-05-15 DIAGNOSIS — R0789 Other chest pain: Secondary | ICD-10-CM | POA: Diagnosis not present

## 2020-05-15 DIAGNOSIS — Z7982 Long term (current) use of aspirin: Secondary | ICD-10-CM | POA: Diagnosis not present

## 2020-05-15 DIAGNOSIS — R202 Paresthesia of skin: Secondary | ICD-10-CM | POA: Diagnosis not present

## 2020-05-15 LAB — COMPREHENSIVE METABOLIC PANEL
ALT: 15 U/L (ref 0–44)
AST: 19 U/L (ref 15–41)
Albumin: 4.2 g/dL (ref 3.5–5.0)
Alkaline Phosphatase: 103 U/L (ref 38–126)
Anion gap: 10 (ref 5–15)
BUN: 16 mg/dL (ref 8–23)
CO2: 23 mmol/L (ref 22–32)
Calcium: 9.2 mg/dL (ref 8.9–10.3)
Chloride: 103 mmol/L (ref 98–111)
Creatinine, Ser: 0.91 mg/dL (ref 0.61–1.24)
GFR calc Af Amer: >60 mL/min (ref >60)
GFR calc non Af Amer: >60 mL/min (ref >60)
Glucose, Bld: 242 mg/dL — ABNORMAL HIGH (ref 70–99)
Potassium: 3.9 mmol/L (ref 3.5–5.1)
Sodium: 136 mmol/L (ref 135–145)
Total Bilirubin: 0.8 mg/dL (ref 0.3–1.2)
Total Protein: 7.5 g/dL (ref 6.5–8.1)

## 2020-05-15 LAB — CBC
HCT: 36.3 % — ABNORMAL LOW (ref 39.0–52.0)
Hemoglobin: 11.4 g/dL — ABNORMAL LOW (ref 13.0–17.0)
MCH: 22.8 pg — ABNORMAL LOW (ref 26.0–34.0)
MCHC: 31.4 g/dL (ref 30.0–36.0)
MCV: 72.6 fL — ABNORMAL LOW (ref 80.0–100.0)
Platelets: 286 10*3/uL (ref 150–400)
RBC: 5 MIL/uL (ref 4.22–5.81)
RDW: 15.2 % (ref 11.5–15.5)
WBC: 6.6 10*3/uL (ref 4.0–10.5)
nRBC: 0 % (ref 0.0–0.2)

## 2020-05-15 LAB — TROPONIN I (HIGH SENSITIVITY): Troponin I (High Sensitivity): 4 ng/L (ref <18)

## 2020-05-15 NOTE — Progress Notes (Signed)
Cardiac Individual Treatment Plan  Patient Details  Name: Joshua Haley MRN: [id number] Date of Birth: 04-Feb-1951 Referring Provider:     Cardiac Rehab from 02/22/2020 in [facility] Cardiac and Pulmonary Rehab  Referring Provider  Wyline Mood MD ([place])      Initial Encounter Date:    Cardiac Rehab from 02/22/2020 in [facility] Cardiac and Pulmonary Rehab  Date  02/22/20      Visit Diagnosis: S/P CABG x 2  Patient's Home Medications on Admission:  Current Outpatient Medications:  .  aspirin 81 MG chewable tablet, Chew 81 mg by mouth daily., Disp: , Rfl:  .  Cholecalciferol (VITAMIN D3) 125 MCG (5000 UT) CAPS, Take 5,000 Units by mouth daily., Disp: , Rfl:  .  Cyanocobalamin 1000 MCG/15ML LIQD, Take 1,000 mcg by mouth daily., Disp: , Rfl:  .  Ferrous Sulfate (IRON) 28 MG TABS, Take 1 tablet (28 mg total) by mouth daily. Can take any over the counter iron supplements after discharge., Disp: , Rfl:  .  glipiZIDE (GLUCOTROL) 5 MG tablet, Take by mouth., Disp: , Rfl:  .  glyBURIDE (DIABETA) 5 MG tablet, Hold while inpatient., Disp: , Rfl:  .  insulin glargine (LANTUS) 100 UNIT/ML injection, Inject 0.15 mLs (15 Units total) into the skin daily. (Patient not taking: Reported on 02/21/2020), Disp: , Rfl:  .  isosorbide dinitrate (ISORDIL) 10 MG tablet, Take by mouth., Disp: , Rfl:  .  ketoconazole (NIZORAL) 2 % cream, Apply 1 application topically 2 (two) times daily., Disp: , Rfl:  .  ketorolac (ACULAR) 0.4 % SOLN, Place 1 drop into the right eye 4 (four) times daily as needed (pain)., Disp: 1 Bottle, Rfl: 0 .  levothyroxine (SYNTHROID) 137 MCG tablet, Take 137 mcg by mouth daily before breakfast., Disp: , Rfl:  .  lisinopril (ZESTRIL) 20 MG tablet, Take 1 tablet (20 mg total) by mouth daily., Disp: , Rfl:  .  meloxicam (MOBIC) 15 MG tablet, Take 1 tablet (15 mg total) by mouth daily as needed for pain., Disp: , Rfl:  .  metFORMIN (GLUCOPHAGE) 1000 MG tablet, Hold while inpatient., Disp: , Rfl:  .   metoprolol succinate (TOPROL-XL) 50 MG 24 hr tablet, Take 50 mg by mouth daily. Take with or immediately following a meal., Disp: , Rfl:  .  metoprolol tartrate (LOPRESSOR) 25 MG tablet, Take by mouth., Disp: , Rfl:  .  pantoprazole (PROTONIX) 40 MG tablet, Take 40 mg by mouth daily., Disp: , Rfl:   Past Medical History: Past Medical History:  Diagnosis Date  . Diabetes mellitus without complication ([place])   . Kidney stone     Tobacco Use: Social History   Tobacco Use  Smoking Status Former Smoker  Smokeless Tobacco Never Used    Labs: Recent [employer] for ITP Cardiac and Pulmonary Rehab Latest Ref Rng & Units 01/20/2020 01/22/2020   Cholestrol 0 - 200 mg/dL - 246(H)   LDLCALC 0 - 99 mg/dL - 157(H)   HDL >40 mg/dL - 33(L)   Trlycerides <150 mg/dL - 279(H)   Hemoglobin A1c 4.8 - 5.6 % 7.9(H) -       Exercise Target Goals: Exercise Program Goal: Individual exercise prescription set using results from initial 6 min walk test and THRR while considering  patient's activity barriers and safety.   Exercise Prescription Goal: Initial exercise prescription builds to 30-45 minutes a day of aerobic activity, 2-3 days per week.  Home exercise guidelines will be given to  patient during program as part of exercise prescription that the participant will acknowledge.   Education: Aerobic Exercise & Resistance Training: - Gives group verbal and written instruction on the various components of exercise. Focuses on aerobic and resistive training programs and the benefits of this training and how to safely progress through these programs..   Education: Exercise & Equipment Safety: - Individual verbal instruction and demonstration of equipment use and safety with use of the equipment.   Cardiac Rehab from 02/22/2020 in [facility] Cardiac and Pulmonary Rehab  Date  02/22/20  Educator  [facility]  Instruction Review Code  1- Verbalizes Understanding      Education: Exercise Physiology  & General Exercise Guidelines: - Group verbal and written instruction with models to review the exercise physiology of the cardiovascular system and associated critical values. Provides general exercise guidelines with specific guidelines to those with heart or lung disease.    Education: Flexibility, Balance, Mind/Body Relaxation: Provides group verbal/written instruction on the benefits of flexibility and balance training, including mind/body exercise modes such as yoga, pilates and tai chi.  Demonstration and skill practice provided.   Activity Barriers & Risk Stratification: Activity Barriers & Cardiac Risk Stratification - 02/22/20 1158      Activity Barriers & Cardiac Risk Stratification   Activity Barriers  Deconditioning;Muscular Weakness;Back Problems;Joint Problems;Other (comment);Incisional Pain    Comments  R sided back/hip pain especially in bed    Cardiac Risk Stratification  Moderate       6 Minute Walk: 6 Minute Walk    Row Name 02/22/20 1157 05/08/20 1114       6 Minute Walk   Phase  Initial  Discharge    Distance  1445 feet  1786 feet    Walk Time  6 minutes  6 minutes    # of Rest Breaks  0  0    MPH  2.74  3.38    METS  3.45  3.9    RPE  8  13    VO2 Peak  12.08  13.64    Symptoms  Yes (comment)  No    Comments  little burning from deeper breathing  --    Resting HR  88 bpm  77 bpm    Resting BP  108/64  120/70    Resting Oxygen Saturation   97 %  --    Exercise Oxygen Saturation  during 6 min walk  98 %  --    Max Ex. HR  113 bpm  103 bpm    Max Ex. BP  142/60  126/74    2 Minute Post BP  128/64  --       Oxygen Initial Assessment:   Oxygen Re-Evaluation:   Oxygen Discharge (Final Oxygen Re-Evaluation):   Initial Exercise Prescription: Initial Exercise Prescription - 02/22/20 1100      Date of Initial Exercise RX and Referring Provider   Date  02/22/20    Referring Provider  Wyline Mood MD ([place])      Treadmill   MPH  3    Grade  0.5     Minutes  15    METs  3.5      Elliptical   Level  1    Speed  3.2    Minutes  15      REL-XR   Level  3    Speed  50    Minutes  15    METs  3      T5  Nustep   Level  3    SPM  80    Minutes  15    METs  3      Prescription Details   Frequency (times per week)  3    Duration  Progress to 30 minutes of continuous aerobic without signs/symptoms of physical distress      Intensity   THRR 40-80% of Max Heartrate  114-139    Ratings of Perceived Exertion  11-13    Perceived Dyspnea  0-4      Progression   Progression  Continue to progress workloads to maintain intensity without signs/symptoms of physical distress.      Resistance Training   Training Prescription  Yes    Weight  3 lb    Reps  10-15       Perform Capillary Blood Glucose checks as needed.  Exercise Prescription Changes: Exercise Prescription Changes    Row Name 02/22/20 1100 03/12/20 1300 03/25/20 1600 04/10/20 1500 04/23/20 1500     Response to Exercise   Blood Pressure (Admit)  108/64  136/80  116/58  120/80  122/64   Blood Pressure (Exercise)  142/60  138/64  130/76  150/80  160/64   Blood Pressure (Exit)  128/64  116/64  118/62  110/58  108/66   Heart Rate (Admit)  88 bpm  73 bpm  83 bpm  81 bpm  88 bpm   Heart Rate (Exercise)  133 bpm  106 bpm  105 bpm  115 bpm  104 bpm   Heart Rate (Exit)  98 bpm  92 bpm  93 bpm  82 bpm  88 bpm   Oxygen Saturation (Admit)  97 %  --  --  --  --   Oxygen Saturation (Exercise)  98 %  --  --  --  --   Rating of Perceived Exertion (Exercise)  _0 Symptoms  burning in chest from deeper breathing  --  --  --  none   Comments  walk test results  --  --  --  --   Duration  --  Continue with 30 min of aerobic exercise without signs/symptoms of physical distress.  Continue with 30 min of aerobic exercise without signs/symptoms of physical distress.  Continue with 30 min of aerobic exercise without signs/symptoms of physical distress.  Continue with 30  min of aerobic exercise without signs/symptoms of physical distress.   Intensity  --  THRR unchanged  THRR unchanged  THRR unchanged  THRR unchanged     Progression   Progression  --  Continue to progress workloads to maintain intensity without signs/symptoms of physical distress.  Continue to progress workloads to maintain intensity without signs/symptoms of physical distress.  Continue to progress workloads to maintain intensity without signs/symptoms of physical distress.  Continue to progress workloads to maintain intensity without signs/symptoms of physical distress.   Average METs  --  --  3.86  3.9  4.12     Resistance Training   Training Prescription  --  Yes  Yes  Yes  Yes   Weight  --  3 lb  3 lb   4 lb   4 lb   Reps  --  10-15  10-15  10-15  10-15     Interval Training   Interval Training  --  --  No  No  No     Treadmill   MPH  --  --  2.9  --  3.1   Grade  --  --  3  --  3   Minutes  --  --  15  --  15   METs  --  --  4.42  --  4.66     Recumbant Bike   Level  --  5  5  --  7   RPM  --  60  --  --  --   Watts  --  41  32  --  57   Minutes  --  15  15  --  15   METs  --  3.3  3.1  --  3.89     NuStep   Level  --  _0 SPM  --  80  --  80  --   Minutes  --  _1 METs  --  4.2  3  3.9  3.8     REL-XR   Level  --  --  6  --  8   Minutes  --  --  15  --  15   METs  --  --  6  --  --     Home Exercise Plan   Plans to continue exercise at  --  --  Home (comment) walking, staff videos  Home (comment) walking, staff videos  Home (comment) walking, staff videos   Frequency  --  --  Add 2 additional days to program exercise sessions.  Add 2 additional days to program exercise sessions.  Add 2 additional days to program exercise sessions.   Initial Home Exercises Provided  --  --  02/29/20  02/29/20  02/29/20   Row Name 05/06/20 1400             Response to Exercise   Blood Pressure (Admit)  124/60       Blood Pressure (Exercise)  146/60        Blood Pressure (Exit)  112/62       Heart Rate (Admit)  59 bpm       Heart Rate (Exercise)  112 bpm       Heart Rate (Exit)  83 bpm       Rating of Perceived Exertion (Exercise)  13       Symptoms  none       Duration  Continue with 30 min of aerobic exercise without signs/symptoms of physical distress.       Intensity  THRR unchanged         Progression   Progression  Continue to progress workloads to maintain intensity without signs/symptoms of physical distress.       Average METs  4.25         Resistance Training   Training Prescription  Yes       Weight  12 lb       Reps  10-15         Interval Training   Interval Training  No         Home Exercise Plan   Plans to continue exercise at  Home (comment) walking, staff videos       Frequency  Add 2 additional days to program exercise sessions.       Initial Home Exercises Provided  02/29/20          Exercise Comments:   Exercise Goals and Review: Exercise Goals  [place] Name 02/22/20 1200             Exercise Goals   Increase Physical Activity  Yes       Intervention  Provide advice, education, support and counseling about physical activity/exercise needs.;Develop an individualized exercise prescription for aerobic and resistive training based on initial evaluation findings, risk stratification, comorbidities and participant's personal goals.       Expected Outcomes  Short Term: Attend rehab on a regular basis to increase amount of physical activity.;Long Term: Add in home exercise to make exercise part of routine and to increase amount of physical activity.;Long Term: Exercising regularly at least 3-5 days a week.       Increase Strength and Stamina  Yes       Intervention  Provide advice, education, support and counseling about physical activity/exercise needs.;Develop an individualized exercise prescription for aerobic and resistive training based on initial evaluation findings, risk stratification, comorbidities and  participant's personal goals.       Expected Outcomes  Short Term: Increase workloads from initial exercise prescription for resistance, speed, and METs.;Short Term: Perform resistance training exercises routinely during rehab and add in resistance training at home;Long Term: Improve cardiorespiratory fitness, muscular endurance and strength as measured by increased METs and functional capacity (6MWT)       Able to understand and use rate of perceived exertion (RPE) scale  Yes       Intervention  Provide education and explanation on how to use RPE scale       Expected Outcomes  Short Term: Able to use RPE daily in rehab to express subjective intensity level;Long Term:  Able to use RPE to guide intensity level when exercising independently       Able to understand and use Dyspnea scale  Yes       Intervention  Provide education and explanation on how to use Dyspnea scale       Expected Outcomes  Long Term: Able to use Dyspnea scale to guide intensity level when exercising independently;Short Term: Able to use Dyspnea scale daily in rehab to express subjective sense of shortness of breath during exertion       Knowledge and understanding of Target Heart Rate Range (THRR)  Yes       Intervention  Provide education and explanation of THRR including how the numbers were predicted and where they are located for reference       Expected Outcomes  Short Term: Able to state/look up THRR;Long Term: Able to use THRR to govern intensity when exercising independently;Short Term: Able to use daily as guideline for intensity in rehab       Able to check pulse independently  Yes       Intervention  Provide education and demonstration on how to check pulse in carotid and radial arteries.;Review the importance of being able to check your own pulse for safety during independent exercise       Expected Outcomes  Short Term: Able to explain why pulse checking is important during independent exercise;Long Term: Able to check  pulse independently and accurately       Understanding of Exercise Prescription  Yes       Intervention  Provide education, explanation, and written materials on patient's individual exercise prescription       Expected Outcomes  Short Term: Able to explain program exercise prescription;Long Term: Able to explain home exercise prescription to exercise independently          Exercise Goals  Re-Evaluation : Exercise Goals Re-Evaluation    Row Name 02/28/20 1033 03/08/20 0941 03/12/20 1325 03/25/20 1624 04/08/20 0949     Exercise Goal Re-Evaluation   Exercise Goals Review  Increase Physical Activity;Understanding of Exercise Prescription;Able to understand and use rate of perceived exertion (RPE) scale;Knowledge and understanding of Target Heart Rate Range (THRR);Increase Strength and Stamina;Able to check pulse independently  Increase Physical Activity;Increase Strength and Stamina;Understanding of Exercise Prescription  Increase Physical Activity;Increase Strength and Stamina;Able to understand and use rate of perceived exertion (RPE) scale;Able to understand and use Dyspnea scale;Knowledge and understanding of Target Heart Rate Range (THRR);Able to check pulse independently;Understanding of Exercise Prescription  Increase Physical Activity;Increase Strength and Stamina;Understanding of Exercise Prescription  Increase Physical Activity;Increase Strength and Stamina;Understanding of Exercise Prescription   Comments  Reviewed RPE scale, THR and program prescription with pt today.  Pt voiced understanding and was given a copy of goals to take home.  Delfino Lovett is off to a good start in rehab.  He can already tell it is speeding up his recovery and giving him more strength and movement.  Reviewed home exercise with pt today.  Pt plans to walk and use staff videos for exercise.  Reviewed THR, pulse, RPE, sign and symptoms, and when to call 911 or MD.  Also discussed weather considerations and indoor options.  Pt  voiced understanding.  He hopes to join [employer] after graduation.  Richard continues to progress well.  Staff will monitor progress.  Delfino Lovett has been doing well in rehab.  He is now up to 37 watts on the recumbent bike.  We will continue to monitor his progress.  Delfino Lovett is doing  well in rehab.  His shoulder has been bothering him and he asked to come off arm crank for time being.  He has been working more and not able to walk as much at home.  He is looking into retiring soon to get more time.   Expected Outcomes  Short: Use RPE daily to regulate intensity. Long: Follow program prescription in THR.  Short: Start to add in more walking at home.  Long: Continue to build stamina.  Short:  increase worklaods to reach RPE 11-13 Long: increase overall MET level  Short: Continue to increase workloads  Long: Conitnue to improve stamina.  Short: Add more walking back in  and talk to doctor about arm  Long: Continue to improve stamina.   [place] Name 04/10/20 1533 04/23/20 1519 05/06/20 1455         Exercise Goal Re-Evaluation   Exercise Goals Review  Increase Physical Activity;Increase Strength and Stamina;Able to understand and use rate of perceived exertion (RPE) scale;Able to understand and use Dyspnea scale;Knowledge and understanding of Target Heart Rate Range (THRR);Able to check pulse independently;Understanding of Exercise Prescription  Increase Physical Activity;Increase Strength and Stamina;Understanding of Exercise Prescription  Increase Physical Activity;Increase Strength and Stamina;Able to understand and use rate of perceived exertion (RPE) scale;Able to understand and use Dyspnea scale;Knowledge and understanding of Target Heart Rate Range (THRR);Able to check pulse independently;Understanding of Exercise Prescription     Comments  Delfino Lovett is making steady progress.  Staff will follow up about arm  Delfino Lovett continues to do well in rehab.  He is now up to level 7 on the bike and has now added in the  treadmill.  We will continue to monitor his progress.  Delfino Lovett is making steady progress.  He is up to 12 lb weights for strength training.     Expected  Outcomes  Short:  continue to attend consistently Long : improve overall MET level  Short; Continue to increase treadmill  Long: Continue to improve stamina.  SHort: continue to increase TM time Long:  improve overall MET level        Discharge Exercise Prescription (Final Exercise Prescription Changes): Exercise Prescription Changes - 05/06/20 1400      Response to Exercise   Blood Pressure (Admit)  124/60    Blood Pressure (Exercise)  146/60    Blood Pressure (Exit)  112/62    Heart Rate (Admit)  59 bpm    Heart Rate (Exercise)  112 bpm    Heart Rate (Exit)  83 bpm    Rating of Perceived Exertion (Exercise)  13    Symptoms  none    Duration  Continue with 30 min of aerobic exercise without signs/symptoms of physical distress.    Intensity  THRR unchanged      Progression   Progression  Continue to progress workloads to maintain intensity without signs/symptoms of physical distress.    Average METs  4.25      Resistance Training   Training Prescription  Yes    Weight  12 lb    Reps  10-15      Interval Training   Interval Training  No      Home Exercise Plan   Plans to continue exercise at  Home (comment)   walking, staff videos   Frequency  Add 2 additional days to program exercise sessions.    Initial Home Exercises Provided  02/29/20       Nutrition:  Target Goals: Understanding of nutrition guidelines, daily intake of sodium <1580m, cholesterol <2021m calories 30% from fat and 7% or less from saturated fats, daily to have 5 or more servings of fruits and vegetables.  Education: Controlling Sodium/Reading Food Labels -Group verbal and written material supporting the discussion of sodium use in heart healthy nutrition. Review and explanation with models, verbal and written materials for utilization of the food  label.   Education: General Nutrition Guidelines/Fats and Fiber: -Group instruction provided by verbal, written material, models and posters to present the general guidelines for heart healthy nutrition. Gives an explanation and review of dietary fats and fiber.   Biometrics: Pre Biometrics - 02/22/20 1201      Pre Biometrics   Height  6' 0.5" (1.842 m)    Weight  218 lb (98.9 kg)    BMI (Calculated)  29.14    Single Leg Stand  30 seconds      Post Biometrics - 05/08/20 1120       Post  Biometrics   Height  6' 0.5" (1.842 m)    Weight  229 lb 4.8 oz (104 kg)    BMI (Calculated)  30.65       Nutrition Therapy Plan and Nutrition Goals: Nutrition Therapy & Goals - 02/27/20 1101      Nutrition Therapy   Diet  HH, DM, Low Na    Protein (specify units)  90-95g    Fiber  30 grams    Whole Grain Foods  3 servings    Saturated Fats  12 max. grams    Fruits and Vegetables  5 servings/day    Sodium  1.5 grams      Personal Nutrition Goals   Nutrition Goal  ST: water LT: weight maintenance    Comments  Not on insulin, last A1c 8, doesn't have new one yet. B: 2 eggs w/  some cheese and some spring mix - 2 oz of oatmeal, grilled chicken sometimes. Coffee w/ stevia. S: fruit  L:moes (beans, cilantro, salad) D: wraps with [place] and cheese with mustard (low carb wrap)S: low sugar ice cream Drinks: sugar free gatorade, sugar free ginger ale. Discussed HH and DM friendly eating. Suggested lowering Na, adding vegetables to dinner, adding protein or fat to fruit snack, discussed adding water.      Intervention Plan   Intervention  Prescribe, educate and counsel regarding individualized specific dietary modifications aiming towards targeted core components such as weight, hypertension, lipid management, diabetes, heart failure and other comorbidities.;Nutrition handout(s) given to patient.    Expected Outcomes  Short Term Goal: Understand basic principles of dietary content, such as calories,  fat, sodium, cholesterol and nutrients.;Short Term Goal: A plan has been developed with personal nutrition goals set during dietitian appointment.;Long Term Goal: Adherence to prescribed nutrition plan.       Nutrition Assessments: Nutrition Assessments - 02/22/20 1204      MEDFICTS Scores   Pre Score  14       MEDIFICTS Score Key:          ?70 Need to make dietary changes          40-70 Heart Healthy Diet         ? 40 Therapeutic Level Cholesterol Diet  Nutrition Goals Re-Evaluation: Nutrition Goals Re-Evaluation    [place] Name 04/11/20 1110 04/29/20 1033           Goals   Nutrition Goal  ST: water LT: weight maintenance  ST: review handouts LT: weight maintenance      Comment  continue with current changes  Pt wife has bought him a water bottle so he has been drinking more water. 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 He doesn't normally eat again until he gets home. His wife cooks budget and heart friendly. Pt reports having fit yogurt that has 6g of added sugar, discussed recommendations for added sugar are < 9 tsp and told him there are about 4g of sugar in 1 tsp so he could keep track. Also discussed BG and fiber/fat/protein roles in BG control; pt nerous about having 1 whole apple. Pt is stressed and part of stress is money - gave planning and shopping on a budget worksheet. 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      Expected Outcome  ST: water LT: weight maintenance  ST: review handouts LT: weight maintenance         Nutrition Goals Discharge (Final Nutrition Goals Re-Evaluation): Nutrition Goals Re-Evaluation - 04/29/20 1033      Goals   Nutrition Goal  ST: review handouts LT: weight maintenance    Comment  Pt wife has bought him a water bottle so he has been drinking more water. 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 He doesn't normally eat again until he gets home. His wife cooks budget and heart friendly. Pt reports having fit yogurt that has 6g of added sugar, discussed recommendations for added sugar are < 9 tsp and told him there are about 4g of sugar in 1 tsp so he could keep track. Also discussed BG and fiber/fat/protein roles in BG control; pt nerous about having 1 whole apple. Pt is stressed and part of stress is money -  gave planning and shopping on a budget worksheet. 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    Expected Outcome  ST: review handouts LT: weight maintenance       Psychosocial: Target Goals: Acknowledge presence or absence of significant depression and/or stress, maximize coping skills, provide positive support system. Participant is able to verbalize types and ability to use techniques and skills needed for reducing stress and depression.   Education: Depression - Provides group verbal and written instruction on the correlation between heart/lung disease and depressed mood, treatment options, and the stigmas associated with seeking treatment.   Education: Sleep Hygiene -Provides group verbal and written instruction about how sleep can affect your health.  Define sleep hygiene, discuss sleep cycles and impact of sleep habits. Review good sleep hygiene tips.     Education: Stress and Anxiety: - Provides group verbal and written instruction about the health risks of elevated stress and causes of high stress.  Discuss the correlation between heart/lung disease and anxiety and treatment options. Review healthy ways to manage with stress and anxiety.    Initial Review & Psychosocial Screening: Initial Psych Review & Screening - 02/21/20 9373      Initial Review   Current issues with  Current Sleep Concerns;Current Stress Concerns    Source of Stress Concerns  Chronic Illness;Financial    Comments  wears CPAP but gets at least 6  hours of sleep since using it, health some financial stressors but it is okay right now      [place]?  Yes   wife, Church family and pastor, prayers   Comments  Relies strongly on his [place], prayers, and church family      Barriers   Psychosocial barriers to participate in program  The patient should benefit from training in stress management and relaxation.;Psychosocial barriers identified (see note)      Screening Interventions   Interventions  Encouraged to exercise;Provide feedback about the scores to participant;To provide support and resources with identified psychosocial needs    Expected Outcomes  Short Term goal: Utilizing psychosocial counselor, staff and physician to assist with identification of specific Stressors or current issues interfering with healing process. Setting desired goal for each stressor or current issue identified.;Long Term Goal: Stressors or current issues are controlled or eliminated.;Short Term goal: Identification and review with participant of any Quality of Life or Depression concerns found by scoring the questionnaire.;Long Term goal: The participant improves quality of Life and PHQ9 Scores as seen by post scores and/or verbalization of changes       Quality of Life Scores:  Quality of Life - 02/22/20 1204      Quality of Life   Select  Quality of Life      Quality of Life Scores   Health/Function Pre  27.2 %    Socioeconomic Pre  26.75 %    Psych/Spiritual Pre  28.21 %    Family Pre  25.2 %    GLOBAL Pre  27.01 %      Scores of 19 and below usually indicate a poorer quality of life in these areas.  A difference of  2-3 points is a clinically meaningful difference.  A difference of 2-3 points in the total score of the Quality of Life Index has been associated with significant improvement in overall quality of life, self-image, physical symptoms, and general health in studies assessing change in quality of  life.  PHQ-9: Recent Review Flowsheet Data    Depression screen [facility] 2/9 02/22/2020   Decreased Interest 0   Down, Depressed, Hopeless 0   PHQ - 2 Score 0   Altered sleeping 0   Tired, decreased energy 0   Change in appetite 0   Feeling bad or failure about yourself  0   Trouble concentrating 0   Moving slowly or fidgety/restless 0   PHQ-9 Score 0   Difficult doing work/chores Not difficult at all     Interpretation of Total Score  Total Score Depression Severity:  1-4 = Minimal depression, 5-9 = Mild depression, 10-14 = Moderate depression, 15-19 = Moderately severe depression, 20-27 = Severe depression   Psychosocial Evaluation and Intervention: Psychosocial Evaluation - 02/21/20 0944      Psychosocial Evaluation & Interventions   Interventions  Stress management education;Encouraged to exercise with the program and follow exercise prescription    Comments  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  He gets most of his care and medical needs through the [place].  He quit smoking in 2011 and doing well with it.  He has a history of diabetes and is under pretty good control.   No history of depression or anxiety.  He has a stronger support system in his wife and church family.  They have had some financial scares during the past year but have gotten it back under control.  He would like to return to work in some compacity but not sure what he wants to do.  He wants to get healthier and work on his diet so that he is able to work again.    Expected Outcomes  Short: Attend rehab regularly to build up stamina.  Long: Build up stamina to be able to return to work.    Continue Psychosocial Services   Follow up required by staff       Psychosocial Re-Evaluation: Psychosocial Re-Evaluation    Row Name 03/08/20 0934 04/08/20 0951           Psychosocial Re-Evaluation   Current issues with  Current Stress Concerns;Current Sleep Concerns  Current Stress  Concerns;Current Sleep Concerns      Comments  Delfino Lovett has been doing well in rehab.  He has enjoyed coming and getting his exercise and meeting his classmates.  He is getting his 6 hours of sleep and using his CPAP but it does dry him out.  He wishes he could sleep better with it.  Overall, things are good.  Delfino Lovett is starting to consider retiring earlier as he has been required to work more and misses his time off. This has been his primary stressor.  He is sleeping well and uses CPAP nightly to be able to sleep.  He gets about 4-7 hours each night.  He uses a strap to keep it on well.      Expected Outcomes  Short: Continue to improve sleep and review sleep info and video.  Long: Continue to stay positive.  Short: Continue to improve sleep night Long: Continue to look forward to retirement      Interventions  Stress management education;Encouraged to attend Cardiac Rehabilitation for the exercise  Stress management education;Encouraged to attend Cardiac Rehabilitation for the exercise      Continue Psychosocial Services   Follow up required by staff  --         Psychosocial Discharge (Final Psychosocial Re-Evaluation): Psychosocial Re-Evaluation - 04/08/20 5681  Psychosocial Re-Evaluation   Current issues with  Current Stress Concerns;Current Sleep Concerns    Comments  Delfino Lovett is starting to consider retiring earlier as he has been required to work more and misses his time off. This has been his primary stressor.  He is sleeping well and uses CPAP nightly to be able to sleep.  He gets about 4-7 hours each night.  He uses a strap to keep it on well.    Expected Outcomes  Short: Continue to improve sleep night Long: Continue to look forward to retirement    Interventions  Stress management education;Encouraged to attend Cardiac Rehabilitation for the exercise       Vocational Rehabilitation: Provide vocational rehab assistance to qualifying candidates.   Vocational Rehab Evaluation &  Intervention: Vocational Rehab - 02/21/20 2458      Initial Vocational Rehab Evaluation & Intervention   Assessment shows need for Vocational Rehabilitation  --   not sure yet      Education: Education Goals: Education classes will be provided on a variety of topics geared toward better understanding of heart health and risk factor modification. Participant will state understanding/return demonstration of topics presented as noted by education test scores.  Learning Barriers/Preferences: Learning Barriers/Preferences - 02/21/20 0998      Learning Barriers/Preferences   Learning Barriers  Sight   glasses   Learning Preferences  Skilled Demonstration;Written Material       General Cardiac Education Topics:  AED/CPR: - Group verbal and written instruction with the use of models to demonstrate the basic use of the AED with the basic ABC's of resuscitation.   Anatomy & Physiology of the Heart: - Group verbal and written instruction and models provide basic cardiac anatomy and physiology, with the coronary electrical and arterial systems. Review of Valvular disease and Heart Failure   Cardiac Procedures: - Group verbal and written instruction to review commonly prescribed medications for heart disease. Reviews the medication, class of the drug, and side effects. Includes the steps to properly store meds and maintain the prescription regimen. (beta blockers and nitrates)   Cardiac Medications I: - Group verbal and written instruction to review commonly prescribed medications for heart disease. Reviews the medication, class of the drug, and side effects. Includes the steps to properly store meds and maintain the prescription regimen.   Cardiac Medications II: -Group verbal and written instruction to review commonly prescribed medications for heart disease. Reviews the medication, class of the drug, and side effects. (all other drug classes)    Go Sex-Intimacy & Heart Disease, Get  SMART - Goal Setting: - Group verbal and written instruction through game format to discuss heart disease and the return to sexual intimacy. Provides group verbal and written material to discuss and apply goal setting through the application of the S.M.A.R.T. Method.   Other Matters of the Heart: - Provides group verbal, written materials and models to describe Stable Angina and Peripheral Artery. Includes description of the disease process and treatment options available to the cardiac patient.   Infection Prevention: - Provides verbal and written material to individual with discussion of infection control including proper hand washing and proper equipment cleaning during exercise session.   Cardiac Rehab from 02/22/2020 in [facility] Cardiac and Pulmonary Rehab  Date  02/22/20  Educator  [facility]  Instruction Review Code  1- Verbalizes Understanding      Falls Prevention: - Provides verbal and written material to individual with discussion of falls prevention and safety.   Cardiac Rehab from  02/22/2020 in [facility] Cardiac and Pulmonary Rehab  Date  02/22/20  Educator  [facility]  Instruction Review Code  1- Verbalizes Understanding      Other: -Provides group and verbal instruction on various topics (see comments)   Knowledge Questionnaire Score: Knowledge Questionnaire Score - 02/22/20 1204      Knowledge Questionnaire Score   Pre Score  24/26 Education Focus: Nutrition, Depression       Core Components/Risk Factors/Patient Goals at Admission: Personal Goals and Risk Factors at Admission - 02/22/20 1205      Core Components/Risk Factors/Patient Goals on Admission    Weight Management  Yes;Weight Loss    Intervention  Weight Management: Develop a combined nutrition and exercise program designed to reach desired caloric intake, while maintaining appropriate intake of nutrient and fiber, sodium and fats, and appropriate energy expenditure required for the weight goal.;Weight Management: Provide  education and appropriate resources to help participant work on and attain dietary goals.    Admit Weight  218 lb (98.9 kg)    Goal Weight: Short Term  213 lb (96.6 kg)    Goal Weight: Long Term  200 lb (90.7 kg)    Expected Outcomes  Short Term: Continue to assess and modify interventions until short term weight is achieved;Long Term: Adherence to nutrition and physical activity/exercise program aimed toward attainment of established weight goal;Weight Loss: Understanding of general recommendations for a balanced deficit meal plan, which promotes 1-2 lb weight loss per week and includes a negative energy balance of [phone] kcal/d;Understanding recommendations for meals to include 15-35% energy as protein, 25-35% energy from fat, 35-60% energy from carbohydrates, less than 256m of dietary cholesterol, 20-35 gm of total fiber daily;Understanding of distribution of calorie intake throughout the day with the consumption of 4-5 meals/snacks    Diabetes  Yes    Intervention  Provide education about signs/symptoms and action to take for hypo/hyperglycemia.;Provide education about proper nutrition, including hydration, and aerobic/resistive exercise prescription along with prescribed medications to achieve blood glucose in normal ranges: Fasting glucose 65-99 mg/dL    Expected Outcomes  Short Term: Participant verbalizes understanding of the signs/symptoms and immediate care of hyper/hypoglycemia, proper foot care and importance of medication, aerobic/resistive exercise and nutrition plan for blood glucose control.;Long Term: Attainment of HbA1C < 7%.    Hypertension  Yes    Intervention  Provide education on lifestyle modifcations including regular physical activity/exercise, weight management, moderate sodium restriction and increased consumption of fresh fruit, vegetables, and low fat dairy, alcohol moderation, and smoking cessation.;Monitor prescription use compliance.    Expected Outcomes  Short Term:  Continued assessment and intervention until BP is < 140/94mHG in hypertensive participants. < 130/8035mG in hypertensive participants with diabetes, heart failure or chronic kidney disease.;Long Term: Maintenance of blood pressure at goal levels.    Lipids  Yes    Intervention  Provide education and support for participant on nutrition & aerobic/resistive exercise along with prescribed medications to achieve LDL <86m69mDL >40mg79m Expected Outcomes  Short Term: Participant states understanding of desired cholesterol values and is compliant with medications prescribed. Participant is following exercise prescription and nutrition guidelines.;Long Term: Cholesterol controlled with medications as prescribed, with individualized exercise RX and with personalized nutrition plan. Value goals: LDL < 86mg,25m > 40 mg.       Education:Diabetes - Individual verbal and written instruction to review signs/symptoms of diabetes, desired ranges of glucose level fasting, after meals and with exercise. Acknowledge that pre and post  exercise glucose checks will be done for 3 sessions at entry of program.   Cardiac Rehab from 02/22/2020 in [facility] Cardiac and Pulmonary Rehab  Date  02/21/20  Educator  [facility]  Instruction Review Code  1- Verbalizes Understanding      Education: Know Your Numbers and Risk Factors: -Group verbal and written instruction about important numbers in your health.  Discussion of what are risk factors and how they play a role in the disease process.  Review of Cholesterol, Blood Pressure, Diabetes, and BMI and the role they play in your overall health.   Core Components/Risk Factors/Patient Goals Review:  Goals and Risk Factor Review    Row Name 03/08/20 0939 04/08/20 0954           Core Components/Risk Factors/Patient Goals Review   Personal Goals Review  Weight Management/Obesity;Hypertension;Diabetes  Weight Management/Obesity;Hypertension;Diabetes      Review  Delfino Lovett is doing well  and losing weight.  He is down to 209lb but would like to lose more.  He really wants to lose more!!  He is working on diet and exercise.  His pressures have been good, meds are working!  Blood sugars are doing okay, 186 today after OJ last night when he was low. He will continue to work on this.  Delfino Lovett is doing well in rehab.  He is up some today as he had [place] last night today he was 223llb but he is trying.  Getting in his [place] in monthly.  His sugars are doing well overall, a little high today from eating out, but overall good around 140s.  His pressure was up today from extra salt yesterday.  Overall, it is doing well.  He is still checking his blood pressures and sugars at home.  His wife keeps him going and on task.      Expected Outcomes  Short: Continue to work on diabetes management and weight loss  Long: Continue to monitor risk factors.  Short: Continue to get weight back down.  Long: Continue to monitor risk factors.         Core Components/Risk Factors/Patient Goals at Discharge (Final Review):  Goals and Risk Factor Review - 04/08/20 0954      Core Components/Risk Factors/Patient Goals Review   Personal Goals Review  Weight Management/Obesity;Hypertension;Diabetes    Review  Delfino Lovett is doing well in rehab.  He is up some today as he had [place] last night today he was 223llb but he is trying.  Getting in his [place] in monthly.  His sugars are doing well overall, a little high today from eating out, but overall good around 140s.  His pressure was up today from extra salt yesterday.  Overall, it is doing well.  He is still checking his blood pressures and sugars at home.  His wife keeps him going and on task.    Expected Outcomes  Short: Continue to get weight back down.  Long: Continue to monitor risk factors.       ITP Comments: ITP Comments    Row Name 02/21/20 0957 02/22/20 1154 02/27/20 1114 02/28/20 1031 03/20/20 0617   ITP Comments  Completed virtual orientation today.   EP evaluation is scheduled for Thursday 2/25  at 930am.  Documentation for diagnosis can be found in CE encounter 01/26/20.  Completed 6MWT and gym orientation.  Initial ITP created and sent for review to Dr. Emily Filbert, Medical Director.  Completed Initial RD Eval  First full day of exercise!  Patient was oriented  to gym and equipment including functions, settings, policies, and procedures.  Patient's individual exercise prescription and treatment plan were reviewed.  All starting workloads were established based on the results of the 6 minute walk test done at initial orientation visit.  The plan for exercise progression was also introduced and progression will be customized based on patient's performance and goals  30 day chart review completed. ITP sent to Dr Zachery Dakins Medical Director, for review,changes as needed and signature. Continue with ITP if no changes requested   Bernice Name 04/17/20 0601 05/10/20 1047 05/15/20 0600       ITP Comments  30 Day review completed. Medical Director review done, changes made as directed,and approval shown by signature of Market researcher.  **EKG changes noted during therapy from prior EKGs. Patient denies any chest pain or other cardiac symptoms throughout duration of therapy. Patient's heart rate 80's-90's and SBP 120-140s. Patient educated to seek medical provider if cardiac symptoms occur**  30 Day review completed. ITP review done, changes made as directed,and approval shown by signature of  Scientist, research (life sciences).        Comments:

## 2020-05-15 NOTE — ED Triage Notes (Signed)
Pt in with co left hand numbness from forearm down. States started intermittently for a week. States also having chest wall burning since he had CABG 1/30. States has been going to cardiac rehab and Monday he was told he had some sort of ST abnormality. [facility]".

## 2020-05-15 NOTE — Progress Notes (Signed)
Daily Session Note  Patient Details  Name: Joshua Haley MRN: [id number] Date of Birth: 1951/11/16 Referring Provider:     Cardiac Rehab from 02/22/2020 in [facility] Cardiac and Pulmonary Rehab  Referring Provider  Wyline Mood MD ([place])      Encounter Date: 05/15/2020  Check In: Session Check In - 05/15/20 0947      Check-In   Supervising physician immediately available to respond to emergencies  See telemetry face sheet for immediately available ER MD    Location  [facility]-Cardiac & Pulmonary Rehab    Staff Present  Heath Lark, RN, BSN, Lance Sell, BA, ACSM CEP, Exercise Physiologist;Joseph Tessie Fass RCP,RRT,BSRT    Virtual Visit  No    Medication changes reported      No    Fall or balance concerns reported     No    Warm-up and Cool-down  Performed on first and last piece of equipment    Resistance Training Performed  Yes    VAD Patient?  No    PAD/SET Patient?  No      Pain Assessment   Currently in Pain?  No/denies          Social History   Tobacco Use  Smoking Status Former Smoker  Smokeless Tobacco Never Used    Goals Met:  Independence with exercise equipment Exercise tolerated well No report of cardiac concerns or symptoms  Goals Unmet:  Not Applicable  Comments: Pt able to follow exercise prescription today without complaint.  Will continue to monitor for progression.    Dr. Emily Filbert is Medical Director for [facility].

## 2020-05-16 ENCOUNTER — Emergency Department
Admission: EM | Admit: 2020-05-16 | Discharge: 2020-05-16 | Disposition: A | Discharge status: Home / Self Care | Payer: No Typology Code available for payment source | Attending: Emergency Medicine | Admitting: Emergency Medicine

## 2020-05-16 DIAGNOSIS — R202 Paresthesia of skin: Secondary | ICD-10-CM

## 2020-05-16 DIAGNOSIS — R9431 Abnormal electrocardiogram [ECG] [EKG]: Secondary | ICD-10-CM

## 2020-05-16 LAB — GLUCOSE, CAPILLARY: Glucose-Capillary: 181 mg/dL — ABNORMAL HIGH (ref 70–99)

## 2020-05-16 NOTE — Discharge Instructions (Addendum)
As we discussed, your EKG was reassuring tonight as was you were medical work-up.  There is no sign you are having any issues with your heart tonight.  I also believe that the numbness that you have in your left arm as a result of the surgery and not a new or emergent medical issue.  I encourage you to follow-up with your regular doctors at the [facility] and return to the nearest emergency department if you develop new or worsening symptoms that concern you.

## 2020-05-16 NOTE — ED Provider Notes (Signed)
[facility] Emergency Department Provider Note  ____________________________________________   First MD Initiated Contact with Patient 05/16/20 0234     (approximate)  I have reviewed the triage vital signs and the nursing notes.   HISTORY  Chief Complaint Numbness    HPI Joshua Haley is a 69 y.o. male who had a CABG about 4 months ago at [facility] and is followed at the [place] who presents for evaluation of some numbness in his left forearm as well as reportedly having some EKG changes during cardiac rehab for which he was told he should come  get checked out.  He said that he has not been having any new chest pain; he still feels sore in his chest wall and sometimes through to his back but that has been the case since his CABG.  He also has had the numbness in his left forearm intermittently since the CABG and his bypass graft was taken from the left arm (he showed me the surgical scar that is well-healed).  Reportedly the technicians or technologist helping him with cardiac rehab today were concerned by "the STs" on his EKG today and they told him to go to the [place], but he decided to come here instead.  He has had no acute chest pain or shortness of breath.  He denies fever, cough, nausea, vomiting, and abdominal pain.  He has no acute weakness in his arms or his legs and is ambulatory without difficulty.  His symptoms are mild and long-term and he has no acute changes.  Nothing in particular makes his symptoms better or worse.        Past Medical History:  Diagnosis Date  . Diabetes mellitus without complication (HCC)   . Kidney stone     Patient Active Problem List   Diagnosis Date Noted  . T2DM (type 2 diabetes mellitus) (HCC) 01/20/2020  . Nephrolithiasis 01/20/2020  . Hypothyroidism 01/20/2020  . GERD (gastroesophageal reflux disease) 01/20/2020  . HTN (hypertension) 01/20/2020  . Chest pain 01/20/2020    Past Surgical History:  Procedure Laterality  Date  . CHOLECYSTECTOMY    . LEFT HEART CATH AND CORONARY ANGIOGRAPHY N/A 01/22/2020   Procedure: LEFT HEART CATH AND CORONARY ANGIOGRAPHY and possible PCI and stent;  Surgeon: Alwyn Pea, MD;  Location: [facility] INVASIVE CV LAB;  Service: Cardiovascular;  Laterality: N/A;    Prior to Admission medications   Medication Sig Start Date End Date Taking? Authorizing Provider  aspirin 81 MG chewable tablet Chew 81 mg by mouth daily.    [provider]  Cholecalciferol (VITAMIN D3) 125 MCG (5000 UT) CAPS Take 5,000 Units by mouth daily.    [provider]  Cyanocobalamin 1000 MCG/15ML LIQD Take 1,000 mcg by mouth daily.    [provider]  Ferrous Sulfate (IRON) 28 MG TABS Take 1 tablet (28 mg total) by mouth daily. Can take any over the counter iron supplements after discharge. 01/22/20   Darlin Priestly, MD  glipiZIDE (GLUCOTROL) 5 MG tablet Take by mouth. 01/30/20 01/29/21  [provider]  glyBURIDE (DIABETA) 5 MG tablet Hold while inpatient. 01/22/20   Darlin Priestly, MD  insulin glargine (LANTUS) 100 UNIT/ML injection Inject 0.15 mLs (15 Units total) into the skin daily. Patient not taking: Reported on 02/21/2020 01/23/20   Darlin Priestly, MD  isosorbide dinitrate (ISORDIL) 10 MG tablet Take by mouth. 01/30/20 01/29/21  [provider]  ketoconazole (NIZORAL) 2 % cream Apply 1 application topically 2 (two) times  daily.    [provider]  ketorolac (ACULAR) 0.4 % SOLN Place 1 drop into the right eye 4 (four) times daily as needed (pain). 06/15/18   Merrily Brittle, MD  levothyroxine (SYNTHROID) 137 MCG tablet Take 137 mcg by mouth daily before breakfast.    [provider]  lisinopril (ZESTRIL) 20 MG tablet Take 1 tablet (20 mg total) by mouth daily. 01/22/20   Darlin Priestly, MD  meloxicam (MOBIC) 15 MG tablet Take 1 tablet (15 mg total) by mouth daily as needed for pain. 01/22/20   Darlin Priestly, MD  metFORMIN (GLUCOPHAGE) 1000 MG tablet Hold while inpatient. 01/22/20    Darlin Priestly, MD  metoprolol succinate (TOPROL-XL) 50 MG 24 hr tablet Take 50 mg by mouth daily. Take with or immediately following a meal.    [provider]  metoprolol tartrate (LOPRESSOR) 25 MG tablet Take by mouth. 01/30/20 01/29/21  [provider]  pantoprazole (PROTONIX) 40 MG tablet Take 40 mg by mouth daily.    [provider]    Allergies Morphine and related and Penicillins  No family history on file.  Social History Social History   Tobacco Use  . Smoking status: Former Games developer  . Smokeless tobacco: Never Used  Substance Use Topics  . Alcohol use: Not Currently  . Drug use: Not on file    Review of Systems Constitutional: No fever/chills Eyes: No visual changes. ENT: No sore throat. Cardiovascular: Denies acute chest pain. Respiratory: Denies shortness of breath. Gastrointestinal: No abdominal pain.  No nausea, no vomiting.  No diarrhea.  No constipation. Genitourinary: Negative for dysuria. Musculoskeletal: Negative for neck pain.  Negative for back pain. Integumentary: Negative for rash. Neurological: Intermittent left forearm numbness after bypass graft harvest 4 months ago.  No acute numbness nor weakness in any extremity.   ____________________________________________   PHYSICAL EXAM:  VITAL SIGNS: ED Triage Vitals  Enc Vitals Group     BP 05/15/20 1917 (!) 158/90     Pulse Rate 05/15/20 1917 83     Resp 05/15/20 1917 18     Temp 05/15/20 1917 98.3 F (36.8 C)     Temp Source 05/15/20 1917 Oral     SpO2 05/15/20 1917 100 %     Weight 05/15/20 1918 99.8 kg (220 lb)     Height 05/15/20 1918 1.803 m (5\' 11" )     Head Circumference --      Peak Flow --      Pain Score 05/15/20 1918 0     Pain Loc --      Pain Edu? --      Excl. in GC? --     Constitutional: Alert and oriented.  Eyes: Conjunctivae are normal.  Head: Atraumatic. Nose: No congestion/rhinnorhea. Mouth/Throat: Patient is wearing a mask. Neck: No stridor.   No meningeal signs.   Cardiovascular: Normal rate, regular rhythm. Good peripheral circulation. Grossly normal heart sounds. Respiratory: Normal respiratory effort.  No retractions. Gastrointestinal: Soft and nontender. No distention.  Musculoskeletal: No lower extremity tenderness nor edema. No gross deformities of extremities. Neurologic:  Normal speech and language. No gross focal neurologic deficits are appreciated.  Skin:  Skin is warm, dry and intact.  Well-healed surgical scar on his sternum as well as his left arm from his CABG 4 months ago. Psychiatric: Mood and affect are normal. Speech and behavior are normal.  ____________________________________________   LABS (all labs ordered are listed, but only abnormal results are displayed)  Labs Reviewed  CBC -  Abnormal; Notable for the following components:      Result Value   Hemoglobin 11.4 (*)    HCT 36.3 (*)    MCV 72.6 (*)    MCH 22.8 (*)    All other components within normal limits  COMPREHENSIVE METABOLIC PANEL - Abnormal; Notable for the following components:   Glucose, Bld 242 (*)    All other components within normal limits  GLUCOSE, CAPILLARY - Abnormal; Notable for the following components:   Glucose-Capillary 181 (*)    All other components within normal limits  TROPONIN I (HIGH SENSITIVITY)  TROPONIN I (HIGH SENSITIVITY)   ____________________________________________  EKG  ED ECG REPORT I, Hinda Kehr, the attending physician, personally viewed and interpreted this ECG.  Date: 05/15/2020 EKG Time: 19: 18 Rate: 86 Rhythm: normal sinus rhythm QRS Axis: normal Intervals: normal ST/T Wave abnormalities: Inverted T waves in lead V2, otherwise unremarkable Narrative Interpretation: no evidence of acute ischemia  ____________________________________________  RADIOLOGY I, Hinda Kehr, personally viewed and evaluated these images (plain radiographs) as part of my medical decision making, as well as  reviewing the written report by the radiologist.  ED MD interpretation: No acute abnormality identified on chest x-ray.  Official radiology report(s): DG Chest 2 View  Result Date: 05/15/2020 CLINICAL DATA:  69 year old male with chest pain, upper extremity numbness on the left. EXAM: CHEST - 2 VIEW COMPARISON:  Chest radiographs 01/19/2020. FINDINGS: Interval median sternotomy/CABG. Mediastinal contours remain within normal limits. Lung volumes are normal. Visualized tracheal air column is within normal limits. No pneumothorax, pulmonary edema, pleural effusion or confluent pulmonary opacity. No acute osseous abnormality identified. Negative visible bowel gas pattern. Stable cholecystectomy clips. IMPRESSION: Interval CABG.  No acute cardiopulmonary abnormality. Electronically Signed   By: Genevie Ann M.D.   On: 05/15/2020 19:37    ____________________________________________   PROCEDURES   Procedure(s) performed (including Critical Care):  Procedures   ____________________________________________   INITIAL IMPRESSION / MDM / [place] / ED COURSE  As part of my medical decision making, I reviewed the following data within the [place] History obtained from family, Labs reviewed , EKG interpreted , Old EKG reviewed, Old chart reviewed, Radiograph reviewed  and Notes from prior ED visits   Differential diagnosis includes, but is not limited to, ACS, nonspecific EKG changes status post CABG, cervical radiculopathy, acute nerve impingement in the left forearm.  The patient has had intermittent left forearm numbness since his CABG where they harvested a vessel from his forearm.  There is no evidence of an acute issue at this time.  His scars are well-healed and there is no sign of cellulitis or abscess or compartment syndrome.  His EKG is reassuring today with no sign of ischemia.  It is changed from the last EKG we have on record which was in January but he has  had a CABG since the last EKG.  I am seeing no concerning changes in the EKG today and he has had no concerning signs or symptoms in the history.  Additionally, his high-sensitivity troponin is only 4 which is also reassuring and his comprehensive metabolic panel and CBC are within normal limits.    I updated him and his wife about the reassuring work-up and they are comfortable with the plan for discharge and outpatient follow-up.  I answered any and all questions posed by him and his wife and I gave my usual and customary return precautions.  ____________________________________________  FINAL CLINICAL IMPRESSION(S) / ED DIAGNOSES  Final diagnoses:  Paresthesia  Abnormal EKG     MEDICATIONS GIVEN DURING THIS VISIT:  Medications - No data to display   ED Discharge Orders    None      *Please note:  Joshua Haley was evaluated in Emergency Department on 05/16/2020 for the symptoms described in the history of present illness. He was evaluated in the context of the global COVID-19 pandemic, which necessitated consideration that the patient might be at risk for infection with the SARS-CoV-2 virus that causes COVID-19. Institutional protocols and algorithms that pertain to the evaluation of patients at risk for COVID-19 are in a state of rapid change based on information released by regulatory bodies including the CDC and federal and state organizations. These policies and algorithms were followed during the patient's care in the ED.  Some ED evaluations and interventions may be delayed as a result of limited staffing during the pandemic.*  Note:  This document was prepared using Dragon voice recognition software and may include unintentional dictation errors.   Loleta Rose, MD 05/16/20 [id number]

## 2020-05-17 ENCOUNTER — Other Ambulatory Visit: Payer: Self-pay

## 2020-05-17 ENCOUNTER — Encounter: Payer: No Typology Code available for payment source | Admitting: *Deleted

## 2020-05-17 DIAGNOSIS — Z951 Presence of aortocoronary bypass graft: Secondary | ICD-10-CM

## 2020-05-17 NOTE — Progress Notes (Signed)
Daily Session Note  Patient Details  Name: Joshua Haley MRN: [id number] Date of Birth: [birth year] Referring Provider:     Cardiac Rehab from 02/22/2020 in [facility] Cardiac and Pulmonary Rehab  Referring Provider  Wyline Mood MD ([place])      Encounter Date: 05/17/2020  Check In: Session Check In - 05/17/20 0948      Check-In   Supervising physician immediately available to respond to emergencies  See telemetry face sheet for immediately available ER MD    Location  [facility]-Cardiac & Pulmonary Rehab    Staff Present  Renita Papa, RN BSN;Joseph [address] [place], [place], RCEP, CCRP, CCET    Virtual Visit  No    Medication changes reported      No    Fall or balance concerns reported     No    Warm-up and Cool-down  Performed on first and last piece of equipment    Resistance Training Performed  Yes    VAD Patient?  No    PAD/SET Patient?  No      Pain Assessment   Currently in Pain?  No/denies          Social History   Tobacco Use  Smoking Status Former Smoker  Smokeless Tobacco Never Used    Goals Met:  Independence with exercise equipment Exercise tolerated well No report of cardiac concerns or symptoms Strength training completed today  Goals Unmet:  Not Applicable  Comments: Pt able to follow exercise prescription today without complaint.  Will continue to monitor for progression.    Dr. Emily Filbert is Medical Director for [place] [facility].

## 2020-05-20 ENCOUNTER — Other Ambulatory Visit: Payer: Self-pay

## 2020-05-20 ENCOUNTER — Encounter: Payer: No Typology Code available for payment source | Admitting: *Deleted

## 2020-05-20 DIAGNOSIS — Z951 Presence of aortocoronary bypass graft: Secondary | ICD-10-CM | POA: Diagnosis not present

## 2020-05-20 NOTE — Progress Notes (Signed)
Daily Session Note  Patient Details  Name: Joshua Haley MRN: [id number] Date of Birth: Nov 14, 1951 Referring Provider:     Cardiac Rehab from 02/22/2020 in [facility] Cardiac and Pulmonary Rehab  Referring Provider  Wyline Mood MD ([place])      Encounter Date: 05/20/2020  Check In: Session Check In - 05/20/20 0946      Check-In   Supervising physician immediately available to respond to emergencies  See telemetry face sheet for immediately available ER MD    Location  [facility]-Cardiac & Pulmonary Rehab    Staff Present  Heath Lark, RN, BSN, Laveda Norman, BS, ACSM CEP, Exercise Physiologist;Joseph Tessie Fass RCP,RRT,BSRT    Virtual Visit  No    Medication changes reported      No    Fall or balance concerns reported     No    Warm-up and Cool-down  Performed on first and last piece of equipment    Resistance Training Performed  Yes    VAD Patient?  No    PAD/SET Patient?  No      Pain Assessment   Currently in Pain?  No/denies          Social History   Tobacco Use  Smoking Status Former Smoker  Smokeless Tobacco Never Used    Goals Met:  Independence with exercise equipment Exercise tolerated well No report of cardiac concerns or symptoms  Goals Unmet:  Not Applicable  Comments: Pt able to follow exercise prescription today without complaint.  Will continue to monitor for progression.    Dr. Emily Filbert is Medical Director for [place] and [facility].

## 2020-05-22 ENCOUNTER — Encounter: Payer: No Typology Code available for payment source | Admitting: *Deleted

## 2020-05-22 ENCOUNTER — Other Ambulatory Visit: Payer: Self-pay

## 2020-05-22 DIAGNOSIS — Z951 Presence of aortocoronary bypass graft: Secondary | ICD-10-CM | POA: Diagnosis not present

## 2020-05-22 NOTE — Progress Notes (Signed)
Daily Session Note  Patient Details  Name: Joshua Haley MRN: [id number] Date of Birth: [birth year] Referring Provider:     Cardiac Rehab from 02/22/2020 in [facility] Cardiac and Pulmonary Rehab  Referring Provider  Wyline Mood MD ([place])      Encounter Date: 05/22/2020  Check In: Session Check In - 05/22/20 1006      Check-In   Supervising physician immediately available to respond to emergencies  See telemetry face sheet for immediately available ER MD    Location  [facility]-Cardiac & Pulmonary Rehab    Staff Present  Renita Papa, RN BSN;Joseph Hood RCP,RRT,BSRT;Melissa [place] RDN, Rowe Pavy, BA, ACSM CEP, Exercise Physiologist    Virtual Visit  No    Medication changes reported      No    Fall or balance concerns reported     No    Warm-up and Cool-down  Performed on first and last piece of equipment    Resistance Training Performed  Yes    VAD Patient?  No    PAD/SET Patient?  No      Pain Assessment   Currently in Pain?  No/denies          Social History   Tobacco Use  Smoking Status Former Smoker  Smokeless Tobacco Never Used    Goals Met:  Independence with exercise equipment Exercise tolerated well No report of cardiac concerns or symptoms Strength training completed today  Goals Unmet:  Not Applicable  Comments:  [place] graduated today from  rehab with 36 sessions completed.  Details of the patient's exercise prescription and what He needs to do in order to continue the prescription and progress were discussed with patient.  Patient was given a copy of prescription and goals.  Patient verbalized understanding.  Zaron plans to continue to exercise by walking at home.    Dr. Emily Filbert is Medical Director for [place] [facility].

## 2020-05-22 NOTE — Patient Instructions (Signed)
Discharge Patient Instructions  Patient Details  Name: Joshua Haley MRN: [id number] Date of Birth: Mar 23, 1951 Referring Provider:  Yehuda Savannah, MD   Number of Visits: 78  Reason for Discharge:  Patient reached a stable level of exercise. Patient independent in their exercise. Patient has met program and personal goals.  Smoking History:  Social History   Tobacco Use  Smoking Status Former Smoker  Smokeless Tobacco Never Used    Diagnosis:  No diagnosis found.  Initial Exercise Prescription: Initial Exercise Prescription - 02/22/20 1100      Date of Initial Exercise RX and Referring Provider   Date  02/22/20    Referring Provider  Wyline Mood [place] ([facility]      Treadmill   MPH  3    Grade  0.5    Minutes  15    METs  3.5      Elliptical   Level  1    Speed  3.2    Minutes  15      REL-XR   Level  3    Speed  50    Minutes  15    METs  3      T5 Nustep   Level  3    SPM  80    Minutes  15    METs  3      Prescription Details   Frequency (times per week)  3    Duration  Progress to 30 minutes of continuous aerobic without signs/symptoms of physical distress      Intensity   THRR 40-80% of Max Heartrate  114-139    Ratings of Perceived Exertion  11-13    Perceived Dyspnea  0-4      Progression   Progression  Continue to progress workloads to maintain intensity without signs/symptoms of physical distress.      Resistance Training   Training Prescription  Yes    Weight  3 lb    Reps  10-15       Discharge Exercise Prescription (Final Exercise Prescription Changes): Exercise Prescription Changes - 05/20/20 1500      Response to Exercise   Blood Pressure (Admit)  122/60    Blood Pressure (Exercise)  136/70    Blood Pressure (Exit)  124/70    Heart Rate (Admit)  109 bpm    Heart Rate (Exercise)  117 bpm    Heart Rate (Exit)  90 bpm    Rating of Perceived Exertion (Exercise)  11    Symptoms  none    Duration  Continue with 30 min of  aerobic exercise without signs/symptoms of physical distress.    Intensity  THRR unchanged      Progression   Progression  Continue to progress workloads to maintain intensity without signs/symptoms of physical distress.    Average METs  5.22      Resistance Training   Training Prescription  Yes    Weight  12 lb    Reps  10-15      Interval Training   Interval Training  No      Treadmill   MPH  3.2    Grade  2    Minutes  15    METs  4.33      REL-XR   Level  6    Speed  50    Minutes  15    METs  6.1      Home Exercise Plan   Plans to continue exercise  at  Home (comment)   walking, staff videos   Frequency  Add 2 additional days to program exercise sessions.    Initial Home Exercises Provided  02/29/20       Functional Capacity: 6 Minute Walk    Row Name 02/22/20 1157 05/08/20 1114       6 Minute Walk   Phase  Initial  Discharge    Distance  1445 feet  1786 feet    Walk Time  6 minutes  6 minutes    # of Rest Breaks  0  0    MPH  2.74  3.38    METS  3.45  3.9    RPE  8  13    VO2 Peak  12.08  13.64    Symptoms  Yes (comment)  No    Comments  little burning from deeper breathing  -    Resting HR  88 bpm  77 bpm    Resting BP  108/64  120/70    Resting Oxygen Saturation   97 %  -    Exercise Oxygen Saturation  during 6 min walk  98 %  -    Max Ex. HR  113 bpm  103 bpm    Max Ex. BP  142/60  126/74    2 Minute Post BP  128/64  -       Quality of Life: Quality of Life - 02/22/20 1204      Quality of Life   Select  Quality of Life      Quality of Life Scores   Health/Function Pre  27.2 %    Socioeconomic Pre  26.75 %    Psych/Spiritual Pre  28.21 %    Family Pre  25.2 %    GLOBAL Pre  27.01 %       Personal Goals: Goals established at orientation with interventions provided to work toward goal. Personal Goals and Risk Factors at Admission - 02/22/20 1205      Core Components/Risk Factors/Patient Goals on Admission    Weight Management   Yes;Weight Loss    Intervention  Weight Management: Develop a combined nutrition and exercise program designed to reach desired caloric intake, while maintaining appropriate intake of nutrient and fiber, sodium and fats, and appropriate energy expenditure required for the weight goal.;Weight Management: Provide education and appropriate resources to help participant work on and attain dietary goals.    Admit Weight  218 lb (98.9 kg)    Goal Weight: Short Term  213 lb (96.6 kg)    Goal Weight: Long Term  200 lb (90.7 kg)    Expected Outcomes  Short Term: Continue to assess and modify interventions until short term weight is achieved;Long Term: Adherence to nutrition and physical activity/exercise program aimed toward attainment of established weight goal;Weight Loss: Understanding of general recommendations for a balanced deficit meal plan, which promotes 1-2 lb weight loss per week and includes a negative energy balance of [phone] kcal/d;Understanding recommendations for meals to include 15-35% energy as protein, 25-35% energy from fat, 35-60% energy from carbohydrates, less than 250m of dietary cholesterol, 20-35 gm of total fiber daily;Understanding of distribution of calorie intake throughout the day with the consumption of 4-5 meals/snacks    Diabetes  Yes    Intervention  Provide education about signs/symptoms and action to take for hypo/hyperglycemia.;Provide education about proper nutrition, including hydration, and aerobic/resistive exercise prescription along with prescribed medications to achieve blood glucose in normal ranges: Fasting glucose 65-99  mg/dL    Expected Outcomes  Short Term: Participant verbalizes understanding of the signs/symptoms and immediate care of hyper/hypoglycemia, proper foot care and importance of medication, aerobic/resistive exercise and nutrition plan for blood glucose control.;Long Term: Attainment of HbA1C < 7%.    Hypertension  Yes    Intervention  Provide  education on lifestyle modifcations including regular physical activity/exercise, weight management, moderate sodium restriction and increased consumption of fresh fruit, vegetables, and low fat dairy, alcohol moderation, and smoking cessation.;Monitor prescription use compliance.    Expected Outcomes  Short Term: Continued assessment and intervention until BP is < 140/65m HG in hypertensive participants. < 130/832mHG in hypertensive participants with diabetes, heart failure or chronic kidney disease.;Long Term: Maintenance of blood pressure at goal levels.    Lipids  Yes    Intervention  Provide education and support for participant on nutrition & aerobic/resistive exercise along with prescribed medications to achieve LDL <7058mHDL >50m63m  Expected Outcomes  Short Term: Participant states understanding of desired cholesterol values and is compliant with medications prescribed. Participant is following exercise prescription and nutrition guidelines.;Long Term: Cholesterol controlled with medications as prescribed, with individualized exercise RX and with personalized nutrition plan. Value goals: LDL < 70mg63mL > 40 mg.        Personal Goals Discharge: Goals and Risk Factor Review - 04/08/20 0954      Core Components/Risk Factors/Patient Goals Review   Personal Goals Review  Weight Management/Obesity;Hypertension;Diabetes    Review  RichaDelfino Lovettoing well in rehab.  He is up some today as he had [place] night today he was 223llb but he is trying.  Getting in his [place].  His sugars are doing well overall, a little high today from eating out, but overall good around 140s.  His pressure was up today from extra salt yesterday.  Overall, it is doing well.  He is still checking his blood pressures and sugars at home.  His wife keeps him going and on task.    Expected Outcomes  Short: Continue to get weight back down.  Long: Continue to monitor risk factors.       Exercise Goals and  Review: Exercise Goals    Row Name 02/22/20 1200             Exercise Goals   Increase Physical Activity  Yes       Intervention  Provide advice, education, support and counseling about physical activity/exercise needs.;Develop an individualized exercise prescription for aerobic and resistive training based on initial evaluation findings, risk stratification, comorbidities and participant's personal goals.       Expected Outcomes  Short Term: Attend rehab on a regular basis to increase amount of physical activity.;Long Term: Add in home exercise to make exercise part of routine and to increase amount of physical activity.;Long Term: Exercising regularly at least 3-5 days a week.       Increase Strength and Stamina  Yes       Intervention  Provide advice, education, support and counseling about physical activity/exercise needs.;Develop an individualized exercise prescription for aerobic and resistive training based on initial evaluation findings, risk stratification, comorbidities and participant's personal goals.       Expected Outcomes  Short Term: Increase workloads from initial exercise prescription for resistance, speed, and METs.;Short Term: Perform resistance training exercises routinely during rehab and add in resistance training at home;Long Term: Improve cardiorespiratory fitness, muscular endurance and strength as measured by increased METs and functional capacity (  6MWT)       Able to understand and use rate of perceived exertion (RPE) scale  Yes       Intervention  Provide education and explanation on how to use RPE scale       Expected Outcomes  Short Term: Able to use RPE daily in rehab to express subjective intensity level;Long Term:  Able to use RPE to guide intensity level when exercising independently       Able to understand and use Dyspnea scale  Yes       Intervention  Provide education and explanation on how to use Dyspnea scale       Expected Outcomes  Long Term: Able to use  Dyspnea scale to guide intensity level when exercising independently;Short Term: Able to use Dyspnea scale daily in rehab to express subjective sense of shortness of breath during exertion       Knowledge and understanding of Target Heart Rate Range (THRR)  Yes       Intervention  Provide education and explanation of THRR including how the numbers were predicted and where they are located for reference       Expected Outcomes  Short Term: Able to state/look up THRR;Long Term: Able to use THRR to govern intensity when exercising independently;Short Term: Able to use daily as guideline for intensity in rehab       Able to check pulse independently  Yes       Intervention  Provide education and demonstration on how to check pulse in carotid and radial arteries.;Review the importance of being able to check your own pulse for safety during independent exercise       Expected Outcomes  Short Term: Able to explain why pulse checking is important during independent exercise;Long Term: Able to check pulse independently and accurately       Understanding of Exercise Prescription  Yes       Intervention  Provide education, explanation, and written materials on patient's individual exercise prescription       Expected Outcomes  Short Term: Able to explain program exercise prescription;Long Term: Able to explain home exercise prescription to exercise independently          Exercise Goals Re-Evaluation: Exercise Goals Re-Evaluation    Row Name 02/28/20 1033 03/08/20 0941 03/12/20 1325 03/25/20 1624 04/08/20 0949     Exercise Goal Re-Evaluation   Exercise Goals Review  Increase Physical Activity;Understanding of Exercise Prescription;Able to understand and use rate of perceived exertion (RPE) scale;Knowledge and understanding of Target Heart Rate Range (THRR);Increase Strength and Stamina;Able to check pulse independently  Increase Physical Activity;Increase Strength and Stamina;Understanding of Exercise  Prescription  Increase Physical Activity;Increase Strength and Stamina;Able to understand and use rate of perceived exertion (RPE) scale;Able to understand and use Dyspnea scale;Knowledge and understanding of Target Heart Rate Range (THRR);Able to check pulse independently;Understanding of Exercise Prescription  Increase Physical Activity;Increase Strength and Stamina;Understanding of Exercise Prescription  Increase Physical Activity;Increase Strength and Stamina;Understanding of Exercise Prescription   Comments  Reviewed RPE scale, THR and program prescription with pt today.  Pt voiced understanding and was given a copy of goals to take home.  Joshua Lovett is off to a good start in rehab.  He can already tell it is speeding up his recovery and giving him more strength and movement.  Reviewed home exercise with pt today.  Pt plans to walk and use staff videos for exercise.  Reviewed THR, pulse, RPE, sign and symptoms, and when to call 911  or MD.  Also discussed weather considerations and indoor options.  Pt voiced understanding.  He hopes to join [employer] after graduation.  Joshua Haley continues to progress well.  Staff will monitor progress.  Joshua Lovett has been doing well in rehab.  He is now up to 37 watts on the recumbent bike.  We will continue to monitor his progress.  Joshua Lovett is doing  well in rehab.  His shoulder has been bothering him and he asked to come off arm crank for time being.  He has been working more and not able to walk as much at home.  He is looking into retiring soon to get more time.   Expected Outcomes  Short: Use RPE daily to regulate intensity. Long: Follow program prescription in THR.  Short: Start to add in more walking at home.  Long: Continue to build stamina.  Short:  increase worklaods to reach RPE 11-13 Long: increase overall MET level  Short: Continue to increase workloads  Long: Conitnue to improve stamina.  Short: Add more walking back in  and talk to doctor about arm  Long: Continue to  improve stamina.   [place] Name 04/10/20 1533 04/23/20 1519 05/06/20 1455 05/20/20 1547       Exercise Goal Re-Evaluation   Exercise Goals Review  Increase Physical Activity;Increase Strength and Stamina;Able to understand and use rate of perceived exertion (RPE) scale;Able to understand and use Dyspnea scale;Knowledge and understanding of Target Heart Rate Range (THRR);Able to check pulse independently;Understanding of Exercise Prescription  Increase Physical Activity;Increase Strength and Stamina;Understanding of Exercise Prescription  Increase Physical Activity;Increase Strength and Stamina;Able to understand and use rate of perceived exertion (RPE) scale;Able to understand and use Dyspnea scale;Knowledge and understanding of Target Heart Rate Range (THRR);Able to check pulse independently;Understanding of Exercise Prescription  Increase Physical Activity;Increase Strength and Stamina;Understanding of Exercise Prescription    Comments  Joshua Lovett is making steady progress.  Staff will follow up about arm  Joshua Lovett continues to do well in rehab.  He is now up to level 7 on the bike and has now added in the treadmill.  We will continue to monitor his progress.  Joshua Lovett is making steady progress.  He is up to 12 lb weights for strength training.  Joshua Haley will be graduating this week.  He is going to continue to exercise by walking at home.  He is also considering joining the [employer].    Expected Outcomes  Short:  continue to attend consistently Long : improve overall MET level  Short; Continue to increase treadmill  Long: Continue to improve stamina.  SHort: continue to increase TM time Long:  improve overall MET level  Short: Graduate Long: Conitnue to exercise independently       Nutrition & Weight - Outcomes: Pre Biometrics - 02/22/20 1201      Pre Biometrics   Height  6' 0.5" (1.842 m)    Weight  218 lb (98.9 kg)    BMI (Calculated)  29.14    Single Leg Stand  30 seconds      Post Biometrics -  05/08/20 1120       Post  Biometrics   Height  6' 0.5" (1.842 m)    Weight  229 lb 4.8 oz (104 kg)    BMI (Calculated)  30.65       Nutrition: Nutrition Therapy & Goals - 02/27/20 1101      Nutrition Therapy   Diet  HH, DM, Low Na    Protein (specify units)  90-95g    Fiber  30 grams    Whole Grain Foods  3 servings    Saturated Fats  12 max. grams    Fruits and Vegetables  5 servings/day    Sodium  1.5 grams      Personal Nutrition Goals   Nutrition Goal  ST: water LT: weight maintenance    Comments  Not on insulin, last A1c 8, doesn't have new one yet. B: 2 eggs w/ some cheese and some spring mix - 2 oz of oatmeal, grilled chicken sometimes. Coffee w/ stevia. S: fruit  L:moes (beans, cilantro, salad) D: wraps with [place] and cheese with mustard (low carb wrap)S: low sugar ice cream Drinks: sugar free gatorade, sugar free ginger ale. Discussed HH and DM friendly eating. Suggested lowering Na, adding vegetables to dinner, adding protein or fat to fruit snack, discussed adding water.      Intervention Plan   Intervention  Prescribe, educate and counsel regarding individualized specific dietary modifications aiming towards targeted core components such as weight, hypertension, lipid management, diabetes, heart failure and other comorbidities.;Nutrition handout(s) given to patient.    Expected Outcomes  Short Term Goal: Understand basic principles of dietary content, such as calories, fat, sodium, cholesterol and nutrients.;Short Term Goal: A plan has been developed with personal nutrition goals set during dietitian appointment.;Long Term Goal: Adherence to prescribed nutrition plan.       Nutrition Discharge: Nutrition Assessments - 02/22/20 1204      MEDFICTS Scores   Pre Score  14       Education Questionnaire Score: Knowledge Questionnaire Score - 02/22/20 1204      Knowledge Questionnaire Score   Pre Score  24/26 Education Focus: Nutrition, Depression       Goals  reviewed with patient; copy given to patient.

## 2020-05-22 NOTE — Progress Notes (Signed)
Cardiac Individual Treatment Plan  Patient Details  Name: Joshua Haley MRN: [id number] Date of Birth: 04-Feb-1951 Referring Provider:     Cardiac Rehab from 02/22/2020 in [facility] Cardiac and Pulmonary Rehab  Referring Provider  Wyline Mood MD ([place])      Initial Encounter Date:    Cardiac Rehab from 02/22/2020 in [facility] Cardiac and Pulmonary Rehab  Date  02/22/20      Visit Diagnosis: S/P CABG x 2  Patient's Home Medications on Admission:  Current Outpatient Medications:  .  aspirin 81 MG chewable tablet, Chew 81 mg by mouth daily., Disp: , Rfl:  .  Cholecalciferol (VITAMIN D3) 125 MCG (5000 UT) CAPS, Take 5,000 Units by mouth daily., Disp: , Rfl:  .  Cyanocobalamin 1000 MCG/15ML LIQD, Take 1,000 mcg by mouth daily., Disp: , Rfl:  .  Ferrous Sulfate (IRON) 28 MG TABS, Take 1 tablet (28 mg total) by mouth daily. Can take any over the counter iron supplements after discharge., Disp: , Rfl:  .  glipiZIDE (GLUCOTROL) 5 MG tablet, Take by mouth., Disp: , Rfl:  .  glyBURIDE (DIABETA) 5 MG tablet, Hold while inpatient., Disp: , Rfl:  .  insulin glargine (LANTUS) 100 UNIT/ML injection, Inject 0.15 mLs (15 Units total) into the skin daily. (Patient not taking: Reported on 02/21/2020), Disp: , Rfl:  .  isosorbide dinitrate (ISORDIL) 10 MG tablet, Take by mouth., Disp: , Rfl:  .  ketoconazole (NIZORAL) 2 % cream, Apply 1 application topically 2 (two) times daily., Disp: , Rfl:  .  ketorolac (ACULAR) 0.4 % SOLN, Place 1 drop into the right eye 4 (four) times daily as needed (pain)., Disp: 1 Bottle, Rfl: 0 .  levothyroxine (SYNTHROID) 137 MCG tablet, Take 137 mcg by mouth daily before breakfast., Disp: , Rfl:  .  lisinopril (ZESTRIL) 20 MG tablet, Take 1 tablet (20 mg total) by mouth daily., Disp: , Rfl:  .  meloxicam (MOBIC) 15 MG tablet, Take 1 tablet (15 mg total) by mouth daily as needed for pain., Disp: , Rfl:  .  metFORMIN (GLUCOPHAGE) 1000 MG tablet, Hold while inpatient., Disp: , Rfl:  .   metoprolol succinate (TOPROL-XL) 50 MG 24 hr tablet, Take 50 mg by mouth daily. Take with or immediately following a meal., Disp: , Rfl:  .  metoprolol tartrate (LOPRESSOR) 25 MG tablet, Take by mouth., Disp: , Rfl:  .  pantoprazole (PROTONIX) 40 MG tablet, Take 40 mg by mouth daily., Disp: , Rfl:   Past Medical History: Past Medical History:  Diagnosis Date  . Diabetes mellitus without complication ([place])   . Kidney stone     Tobacco Use: Social History   Tobacco Use  Smoking Status Former Smoker  Smokeless Tobacco Never Used    Labs: Recent [employer] for ITP Cardiac and Pulmonary Rehab Latest Ref Rng & Units 01/20/2020 01/22/2020   Cholestrol 0 - 200 mg/dL - 246(H)   LDLCALC 0 - 99 mg/dL - 157(H)   HDL >40 mg/dL - 33(L)   Trlycerides <150 mg/dL - 279(H)   Hemoglobin A1c 4.8 - 5.6 % 7.9(H) -       Exercise Target Goals: Exercise Program Goal: Individual exercise prescription set using results from initial 6 min walk test and THRR while considering  patient's activity barriers and safety.   Exercise Prescription Goal: Initial exercise prescription builds to 30-45 minutes a day of aerobic activity, 2-3 days per week.  Home exercise guidelines will be given to  patient during program as part of exercise prescription that the participant will acknowledge.   Education: Aerobic Exercise & Resistance Training: - Gives group verbal and written instruction on the various components of exercise. Focuses on aerobic and resistive training programs and the benefits of this training and how to safely progress through these programs..   Education: Exercise & Equipment Safety: - Individual verbal instruction and demonstration of equipment use and safety with use of the equipment.   Cardiac Rehab from 02/22/2020 in [facility] Cardiac and Pulmonary Rehab  Date  02/22/20  Educator  [facility]  Instruction Review Code  1- Verbalizes Understanding      Education: Exercise Physiology  & General Exercise Guidelines: - Group verbal and written instruction with models to review the exercise physiology of the cardiovascular system and associated critical values. Provides general exercise guidelines with specific guidelines to those with heart or lung disease.    Education: Flexibility, Balance, Mind/Body Relaxation: Provides group verbal/written instruction on the benefits of flexibility and balance training, including mind/body exercise modes such as yoga, pilates and tai chi.  Demonstration and skill practice provided.   Activity Barriers & Risk Stratification: Activity Barriers & Cardiac Risk Stratification - 02/22/20 1158      Activity Barriers & Cardiac Risk Stratification   Activity Barriers  Deconditioning;Muscular Weakness;Back Problems;Joint Problems;Other (comment);Incisional Pain    Comments  R sided back/hip pain especially in bed    Cardiac Risk Stratification  Moderate       6 Minute Walk: 6 Minute Walk    Row Name 02/22/20 1157 05/08/20 1114       6 Minute Walk   Phase  Initial  Discharge    Distance  1445 feet  1786 feet    Walk Time  6 minutes  6 minutes    # of Rest Breaks  0  0    MPH  2.74  3.38    METS  3.45  3.9    RPE  8  13    VO2 Peak  12.08  13.64    Symptoms  Yes (comment)  No    Comments  little burning from deeper breathing  --    Resting HR  88 bpm  77 bpm    Resting BP  108/64  120/70    Resting Oxygen Saturation   97 %  --    Exercise Oxygen Saturation  during 6 min walk  98 %  --    Max Ex. HR  113 bpm  103 bpm    Max Ex. BP  142/60  126/74    2 Minute Post BP  128/64  --       Oxygen Initial Assessment:   Oxygen Re-Evaluation:   Oxygen Discharge (Final Oxygen Re-Evaluation):   Initial Exercise Prescription: Initial Exercise Prescription - 02/22/20 1100      Date of Initial Exercise RX and Referring Provider   Date  02/22/20    Referring Provider  Wyline Mood MD ([place])      Treadmill   MPH  3    Grade  0.5     Minutes  15    METs  3.5      Elliptical   Level  1    Speed  3.2    Minutes  15      REL-XR   Level  3    Speed  50    Minutes  15    METs  3      T5  Nustep   Level  3    SPM  80    Minutes  15    METs  3      Prescription Details   Frequency (times per week)  3    Duration  Progress to 30 minutes of continuous aerobic without signs/symptoms of physical distress      Intensity   THRR 40-80% of Max Heartrate  114-139    Ratings of Perceived Exertion  11-13    Perceived Dyspnea  0-4      Progression   Progression  Continue to progress workloads to maintain intensity without signs/symptoms of physical distress.      Resistance Training   Training Prescription  Yes    Weight  3 lb    Reps  10-15       Perform Capillary Blood Glucose checks as needed.  Exercise Prescription Changes: Exercise Prescription Changes    Row Name 02/22/20 1100 03/12/20 1300 03/25/20 1600 04/10/20 1500 04/23/20 1500     Response to Exercise   Blood Pressure (Admit)  108/64  136/80  116/58  120/80  122/64   Blood Pressure (Exercise)  142/60  138/64  130/76  150/80  160/64   Blood Pressure (Exit)  128/64  116/64  118/62  110/58  108/66   Heart Rate (Admit)  88 bpm  73 bpm  83 bpm  81 bpm  88 bpm   Heart Rate (Exercise)  133 bpm  106 bpm  105 bpm  115 bpm  104 bpm   Heart Rate (Exit)  98 bpm  92 bpm  93 bpm  82 bpm  88 bpm   Oxygen Saturation (Admit)  97 %  --  --  --  --   Oxygen Saturation (Exercise)  98 %  --  --  --  --   Rating of Perceived Exertion (Exercise)  _0 Symptoms  burning in chest from deeper breathing  --  --  --  none   Comments  walk test results  --  --  --  --   Duration  --  Continue with 30 min of aerobic exercise without signs/symptoms of physical distress.  Continue with 30 min of aerobic exercise without signs/symptoms of physical distress.  Continue with 30 min of aerobic exercise without signs/symptoms of physical distress.  Continue with 30  min of aerobic exercise without signs/symptoms of physical distress.   Intensity  --  THRR unchanged  THRR unchanged  THRR unchanged  THRR unchanged     Progression   Progression  --  Continue to progress workloads to maintain intensity without signs/symptoms of physical distress.  Continue to progress workloads to maintain intensity without signs/symptoms of physical distress.  Continue to progress workloads to maintain intensity without signs/symptoms of physical distress.  Continue to progress workloads to maintain intensity without signs/symptoms of physical distress.   Average METs  --  --  3.86  3.9  4.12     Resistance Training   Training Prescription  --  Yes  Yes  Yes  Yes   Weight  --  3 lb  3 lb   4 lb   4 lb   Reps  --  10-15  10-15  10-15  10-15     Interval Training   Interval Training  --  --  No  No  No     Treadmill   MPH  --  --  2.9  --  3.1   Grade  --  --  3  --  3   Minutes  --  --  15  --  15   METs  --  --  4.42  --  4.66     Recumbant Bike   Level  --  5  5  --  7   RPM  --  60  --  --  --   Watts  --  41  32  --  57   Minutes  --  15  15  --  15   METs  --  3.3  3.1  --  3.89     NuStep   Level  --  _0 SPM  --  80  --  80  --   Minutes  --  _1 METs  --  4.2  3  3.9  3.8     REL-XR   Level  --  --  6  --  8   Minutes  --  --  15  --  15   METs  --  --  6  --  --     Home Exercise Plan   Plans to continue exercise at  --  --  Home (comment) walking, staff videos  Home (comment) walking, staff videos  Home (comment) walking, staff videos   Frequency  --  --  Add 2 additional days to program exercise sessions.  Add 2 additional days to program exercise sessions.  Add 2 additional days to program exercise sessions.   Initial Home Exercises Provided  --  --  02/29/20  02/29/20  02/29/20   Row Name 05/06/20 1400 05/20/20 1500           Response to Exercise   Blood Pressure (Admit)  124/60  122/60      Blood Pressure  (Exercise)  146/60  136/70      Blood Pressure (Exit)  112/62  124/70      Heart Rate (Admit)  59 bpm  109 bpm      Heart Rate (Exercise)  112 bpm  117 bpm      Heart Rate (Exit)  83 bpm  90 bpm      Rating of Perceived Exertion (Exercise)  13  11      Symptoms  none  none      Duration  Continue with 30 min of aerobic exercise without signs/symptoms of physical distress.  Continue with 30 min of aerobic exercise without signs/symptoms of physical distress.      Intensity  THRR unchanged  THRR unchanged        Progression   Progression  Continue to progress workloads to maintain intensity without signs/symptoms of physical distress.  Continue to progress workloads to maintain intensity without signs/symptoms of physical distress.      Average METs  4.25  5.22        Resistance Training   Training Prescription  Yes  Yes      Weight  12 lb  12 lb      Reps  10-15  10-15        Interval Training   Interval Training  No  No        Treadmill   MPH  --  3.2      Grade  --  2  Minutes  --  15      METs  --  4.33        REL-XR   Level  --  6      Speed  --  50      Minutes  --  15      METs  --  6.1        Home Exercise Plan   Plans to continue exercise at  Home (comment) walking, staff videos  Home (comment) walking, staff videos      Frequency  Add 2 additional days to program exercise sessions.  Add 2 additional days to program exercise sessions.      Initial Home Exercises Provided  02/29/20  02/29/20         Exercise Comments:   Exercise Goals and Review: Exercise Goals    Row Name 02/22/20 1200             Exercise Goals   Increase Physical Activity  Yes       Intervention  Provide advice, education, support and counseling about physical activity/exercise needs.;Develop an individualized exercise prescription for aerobic and resistive training based on initial evaluation findings, risk stratification, comorbidities and participant's personal goals.        Expected Outcomes  Short Term: Attend rehab on a regular basis to increase amount of physical activity.;Long Term: Add in home exercise to make exercise part of routine and to increase amount of physical activity.;Long Term: Exercising regularly at least 3-5 days a week.       Increase Strength and Stamina  Yes       Intervention  Provide advice, education, support and counseling about physical activity/exercise needs.;Develop an individualized exercise prescription for aerobic and resistive training based on initial evaluation findings, risk stratification, comorbidities and participant's personal goals.       Expected Outcomes  Short Term: Increase workloads from initial exercise prescription for resistance, speed, and METs.;Short Term: Perform resistance training exercises routinely during rehab and add in resistance training at home;Long Term: Improve cardiorespiratory fitness, muscular endurance and strength as measured by increased METs and functional capacity (6MWT)       Able to understand and use rate of perceived exertion (RPE) scale  Yes       Intervention  Provide education and explanation on how to use RPE scale       Expected Outcomes  Short Term: Able to use RPE daily in rehab to express subjective intensity level;Long Term:  Able to use RPE to guide intensity level when exercising independently       Able to understand and use Dyspnea scale  Yes       Intervention  Provide education and explanation on how to use Dyspnea scale       Expected Outcomes  Long Term: Able to use Dyspnea scale to guide intensity level when exercising independently;Short Term: Able to use Dyspnea scale daily in rehab to express subjective sense of shortness of breath during exertion       Knowledge and understanding of Target Heart Rate Range (THRR)  Yes       Intervention  Provide education and explanation of THRR including how the numbers were predicted and where they are located for reference       Expected  Outcomes  Short Term: Able to state/look up THRR;Long Term: Able to use THRR to govern intensity when exercising independently;Short Term: Able to use daily as guideline for intensity in rehab  Able to check pulse independently  Yes       Intervention  Provide education and demonstration on how to check pulse in carotid and radial arteries.;Review the importance of being able to check your own pulse for safety during independent exercise       Expected Outcomes  Short Term: Able to explain why pulse checking is important during independent exercise;Long Term: Able to check pulse independently and accurately       Understanding of Exercise Prescription  Yes       Intervention  Provide education, explanation, and written materials on patient's individual exercise prescription       Expected Outcomes  Short Term: Able to explain program exercise prescription;Long Term: Able to explain home exercise prescription to exercise independently          Exercise Goals Re-Evaluation : Exercise Goals Re-Evaluation    Row Name 02/28/20 1033 03/08/20 0941 03/12/20 1325 03/25/20 1624 04/08/20 0949     Exercise Goal Re-Evaluation   Exercise Goals Review  Increase Physical Activity;Understanding of Exercise Prescription;Able to understand and use rate of perceived exertion (RPE) scale;Knowledge and understanding of Target Heart Rate Range (THRR);Increase Strength and Stamina;Able to check pulse independently  Increase Physical Activity;Increase Strength and Stamina;Understanding of Exercise Prescription  Increase Physical Activity;Increase Strength and Stamina;Able to understand and use rate of perceived exertion (RPE) scale;Able to understand and use Dyspnea scale;Knowledge and understanding of Target Heart Rate Range (THRR);Able to check pulse independently;Understanding of Exercise Prescription  Increase Physical Activity;Increase Strength and Stamina;Understanding of Exercise Prescription  Increase Physical  Activity;Increase Strength and Stamina;Understanding of Exercise Prescription   Comments  Reviewed RPE scale, THR and program prescription with pt today.  Pt voiced understanding and was given a copy of goals to take home.  Delfino Lovett is off to a good start in rehab.  He can already tell it is speeding up his recovery and giving him more strength and movement.  Reviewed home exercise with pt today.  Pt plans to walk and use staff videos for exercise.  Reviewed THR, pulse, RPE, sign and symptoms, and when to call 911 or MD.  Also discussed weather considerations and indoor options.  Pt voiced understanding.  He hopes to join [employer] after graduation.  Richard continues to progress well.  Staff will monitor progress.  Delfino Lovett has been doing well in rehab.  He is now up to 37 watts on the recumbent bike.  We will continue to monitor his progress.  Delfino Lovett is doing  well in rehab.  His shoulder has been bothering him and he asked to come off arm crank for time being.  He has been working more and not able to walk as much at home.  He is looking into retiring soon to get more time.   Expected Outcomes  Short: Use RPE daily to regulate intensity. Long: Follow program prescription in THR.  Short: Start to add in more walking at home.  Long: Continue to build stamina.  Short:  increase worklaods to reach RPE 11-13 Long: increase overall MET level  Short: Continue to increase workloads  Long: Conitnue to improve stamina.  Short: Add more walking back in  and talk to doctor about arm  Long: Continue to improve stamina.   [place] Name 04/10/20 1533 04/23/20 1519 05/06/20 1455 05/20/20 1547       Exercise Goal Re-Evaluation   Exercise Goals Review  Increase Physical Activity;Increase Strength and Stamina;Able to understand and use rate of perceived exertion (RPE) scale;Able to  understand and use Dyspnea scale;Knowledge and understanding of Target Heart Rate Range (THRR);Able to check pulse independently;Understanding of  Exercise Prescription  Increase Physical Activity;Increase Strength and Stamina;Understanding of Exercise Prescription  Increase Physical Activity;Increase Strength and Stamina;Able to understand and use rate of perceived exertion (RPE) scale;Able to understand and use Dyspnea scale;Knowledge and understanding of Target Heart Rate Range (THRR);Able to check pulse independently;Understanding of Exercise Prescription  Increase Physical Activity;Increase Strength and Stamina;Understanding of Exercise Prescription    Comments  Delfino Lovett is making steady progress.  Staff will follow up about arm  Delfino Lovett continues to do well in rehab.  He is now up to level 7 on the bike and has now added in the treadmill.  We will continue to monitor his progress.  Delfino Lovett is making steady progress.  He is up to 12 lb weights for strength training.  Richard will be graduating this week.  He is going to continue to exercise by walking at home.  He is also considering joining the [employer].    Expected Outcomes  Short:  continue to attend consistently Long : improve overall MET level  Short; Continue to increase treadmill  Long: Continue to improve stamina.  SHort: continue to increase TM time Long:  improve overall MET level  Short: Graduate Long: Conitnue to exercise independently       Discharge Exercise Prescription (Final Exercise Prescription Changes): Exercise Prescription Changes - 05/20/20 1500      Response to Exercise   Blood Pressure (Admit)  122/60    Blood Pressure (Exercise)  136/70    Blood Pressure (Exit)  124/70    Heart Rate (Admit)  109 bpm    Heart Rate (Exercise)  117 bpm    Heart Rate (Exit)  90 bpm    Rating of Perceived Exertion (Exercise)  11    Symptoms  none    Duration  Continue with 30 min of aerobic exercise without signs/symptoms of physical distress.    Intensity  THRR unchanged      Progression   Progression  Continue to progress workloads to maintain intensity without signs/symptoms of  physical distress.    Average METs  5.22      Resistance Training   Training Prescription  Yes    Weight  12 lb    Reps  10-15      Interval Training   Interval Training  No      Treadmill   MPH  3.2    Grade  2    Minutes  15    METs  4.33      REL-XR   Level  6    Speed  50    Minutes  15    METs  6.1      Home Exercise Plan   Plans to continue exercise at  Home (comment)   walking, staff videos   Frequency  Add 2 additional days to program exercise sessions.    Initial Home Exercises Provided  02/29/20       Nutrition:  Target Goals: Understanding of nutrition guidelines, daily intake of sodium <1589m, cholesterol <2070m calories 30% from fat and 7% or less from saturated fats, daily to have 5 or more servings of fruits and vegetables.  Education: Controlling Sodium/Reading Food Labels -Group verbal and written material supporting the discussion of sodium use in heart healthy nutrition. Review and explanation with models, verbal and written materials for utilization of the food label.   Education: General Nutrition Guidelines/Fats and Fiber: -  Group instruction provided by verbal, written material, models and posters to present the general guidelines for heart healthy nutrition. Gives an explanation and review of dietary fats and fiber.   Biometrics: Pre Biometrics - 02/22/20 1201      Pre Biometrics   Height  6' 0.5" (1.842 m)    Weight  218 lb (98.9 kg)    BMI (Calculated)  29.14    Single Leg Stand  30 seconds      Post Biometrics - 05/08/20 1120       Post  Biometrics   Height  6' 0.5" (1.842 m)    Weight  229 lb 4.8 oz (104 kg)    BMI (Calculated)  30.65       Nutrition Therapy Plan and Nutrition Goals: Nutrition Therapy & Goals - 02/27/20 1101      Nutrition Therapy   Diet  HH, DM, Low Na    Protein (specify units)  90-95g    Fiber  30 grams    Whole Grain Foods  3 servings    Saturated Fats  12 max. grams    Fruits and Vegetables  5  servings/day    Sodium  1.5 grams      Personal Nutrition Goals   Nutrition Goal  ST: water LT: weight maintenance    Comments  Not on insulin, last A1c 8, doesn't have new one yet. B: 2 eggs w/ some cheese and some spring mix - 2 oz of oatmeal, grilled chicken sometimes. Coffee w/ stevia. S: fruit  L:moes (beans, cilantro, salad) D: wraps with [place] and cheese with mustard (low carb wrap)S: low sugar ice cream Drinks: sugar free gatorade, sugar free ginger ale. Discussed HH and DM friendly eating. Suggested lowering Na, adding vegetables to dinner, adding protein or fat to fruit snack, discussed adding water.      Intervention Plan   Intervention  Prescribe, educate and counsel regarding individualized specific dietary modifications aiming towards targeted core components such as weight, hypertension, lipid management, diabetes, heart failure and other comorbidities.;Nutrition handout(s) given to patient.    Expected Outcomes  Short Term Goal: Understand basic principles of dietary content, such as calories, fat, sodium, cholesterol and nutrients.;Short Term Goal: A plan has been developed with personal nutrition goals set during dietitian appointment.;Long Term Goal: Adherence to prescribed nutrition plan.       Nutrition Assessments: Nutrition Assessments - 02/22/20 1204      MEDFICTS Scores   Pre Score  14       MEDIFICTS Score Key:          ?70 Need to make dietary changes          40-70 Heart Healthy Diet         ? 40 Therapeutic Level Cholesterol Diet  Nutrition Goals Re-Evaluation: Nutrition Goals Re-Evaluation    [place] Name 04/11/20 1110 04/29/20 1033           Goals   Nutrition Goal  ST: water LT: weight maintenance  ST: review handouts LT: weight maintenance      Comment  continue with current changes  Pt wife has bought him a water bottle so he has been drinking more water. 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 He doesn't normally eat again until he gets home. His wife cooks budget and heart friendly. Pt reports having fit yogurt that has 6g of added sugar, discussed recommendations for added sugar  are < 9 tsp and told him there are about 4g of sugar in 1 tsp so he could keep track. Also discussed BG and fiber/fat/protein roles in BG control; pt nerous about having 1 whole apple. Pt is stressed and part of stress is money - gave planning and shopping on a budget worksheet. 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      Expected Outcome  ST: water LT: weight maintenance  ST: review handouts LT: weight maintenance         Nutrition Goals Discharge (Final Nutrition Goals Re-Evaluation): Nutrition Goals Re-Evaluation - 04/29/20 1033      Goals   Nutrition Goal  ST: review handouts LT: weight maintenance    Comment  Pt wife has bought him a water bottle so he has been drinking more water. 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 He doesn't normally eat again until he gets home. His wife cooks budget and heart friendly. Pt reports having fit yogurt that has 6g of added sugar, discussed recommendations for added sugar are < 9 tsp and told him there are about 4g of sugar in 1 tsp so he could keep track. Also discussed BG and fiber/fat/protein roles in BG control; pt nerous about having 1 whole apple. Pt is stressed and part of stress is money - gave planning and shopping on a budget worksheet. 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    Expected Outcome  ST: review handouts LT: weight maintenance       Psychosocial: Target Goals: Acknowledge presence or absence of significant depression and/or stress, maximize coping skills, provide positive support system. Participant is able to verbalize types and ability to use techniques and skills needed for reducing stress and  depression.   Education: Depression - Provides group verbal and written instruction on the correlation between heart/lung disease and depressed mood, treatment options, and the stigmas associated with seeking treatment.   Education: Sleep Hygiene -Provides group verbal and written instruction about how sleep can affect your health.  Define sleep hygiene, discuss sleep cycles and impact of sleep habits. Review good sleep hygiene tips.     Education: Stress and Anxiety: - Provides group verbal and written instruction about the health risks of elevated stress and causes of high stress.  Discuss the correlation between heart/lung disease and anxiety and treatment options. Review healthy ways to manage with stress and anxiety.    Initial Review & Psychosocial Screening: Initial Psych Review & Screening - 02/21/20 2458      Initial Review   Current issues with  Current Sleep Concerns;Current Stress Concerns    Source of Stress Concerns  Chronic Illness;Financial    Comments  wears CPAP but gets at least 6 hours of sleep since using it, health some financial stressors but it is okay right now      [place]?  Yes   wife, Church family and pastor, prayers   Comments  Relies strongly on his [place], prayers, and church family      Barriers   Psychosocial barriers to participate in program  The patient should benefit from training in stress management and relaxation.;Psychosocial barriers identified (see note)      Screening Interventions   Interventions  Encouraged to exercise;Provide feedback about the scores to participant;To provide support and resources with identified psychosocial needs  Expected Outcomes  Short Term goal: Utilizing psychosocial counselor, staff and physician to assist with identification of specific Stressors or current issues interfering with healing process. Setting desired goal for each stressor or current issue identified.;Long  Term Goal: Stressors or current issues are controlled or eliminated.;Short Term goal: Identification and review with participant of any Quality of Life or Depression concerns found by scoring the questionnaire.;Long Term goal: The participant improves quality of Life and PHQ9 Scores as seen by post scores and/or verbalization of changes       Quality of Life Scores:  Quality of Life - 02/22/20 1204      Quality of Life   Select  Quality of Life      Quality of Life Scores   Health/Function Pre  27.2 %    Socioeconomic Pre  26.75 %    Psych/Spiritual Pre  28.21 %    Family Pre  25.2 %    GLOBAL Pre  27.01 %      Scores of 19 and below usually indicate a poorer quality of life in these areas.  A difference of  2-3 points is a clinically meaningful difference.  A difference of 2-3 points in the total score of the Quality of Life Index has been associated with significant improvement in overall quality of life, self-image, physical symptoms, and general health in studies assessing change in quality of life.  PHQ-9: Recent Review Flowsheet Data    Depression screen [facility] 2/9 02/22/2020   Decreased Interest 0   Down, Depressed, Hopeless 0   PHQ - 2 Score 0   Altered sleeping 0   Tired, decreased energy 0   Change in appetite 0   Feeling bad or failure about yourself  0   Trouble concentrating 0   Moving slowly or fidgety/restless 0   PHQ-9 Score 0   Difficult doing work/chores Not difficult at all     Interpretation of Total Score  Total Score Depression Severity:  1-4 = Minimal depression, 5-9 = Mild depression, 10-14 = Moderate depression, 15-19 = Moderately severe depression, 20-27 = Severe depression   Psychosocial Evaluation and Intervention: Psychosocial Evaluation - 02/21/20 0944      Psychosocial Evaluation & Interventions   Interventions  Stress management education;Encouraged to exercise with the program and follow exercise prescription    Comments  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  He gets most of his care and medical needs through the [place].  He quit smoking in 2011 and doing well with it.  He has a history of diabetes and is under pretty good control.   No history of depression or anxiety.  He has a stronger support system in his wife and church family.  They have had some financial scares during the past year but have gotten it back under control.  He would like to return to work in some compacity but not sure what he wants to do.  He wants to get healthier and work on his diet so that he is able to work again.    Expected Outcomes  Short: Attend rehab regularly to build up stamina.  Long: Build up stamina to be able to return to work.    Continue Psychosocial Services   Follow up required by staff       Psychosocial Re-Evaluation: Psychosocial Re-Evaluation    [place] Name 03/08/20 [id number] 04/08/20 0951           Psychosocial  Re-Evaluation   Current issues with  Current Stress Concerns;Current Sleep Concerns  Current Stress Concerns;Current Sleep Concerns      Comments  Delfino Lovett has been doing well in rehab.  He has enjoyed coming and getting his exercise and meeting his classmates.  He is getting his 6 hours of sleep and using his CPAP but it does dry him out.  He wishes he could sleep better with it.  Overall, things are good.  Delfino Lovett is starting to consider retiring earlier as he has been required to work more and misses his time off. This has been his primary stressor.  He is sleeping well and uses CPAP nightly to be able to sleep.  He gets about 4-7 hours each night.  He uses a strap to keep it on well.      Expected Outcomes  Short: Continue to improve sleep and review sleep info and video.  Long: Continue to stay positive.  Short: Continue to improve sleep night Long: Continue to look forward to retirement      Interventions  Stress management education;Encouraged to attend Cardiac Rehabilitation for the exercise  Stress  management education;Encouraged to attend Cardiac Rehabilitation for the exercise      Continue Psychosocial Services   Follow up required by staff  --         Psychosocial Discharge (Final Psychosocial Re-Evaluation): Psychosocial Re-Evaluation - 04/08/20 0951      Psychosocial Re-Evaluation   Current issues with  Current Stress Concerns;Current Sleep Concerns    Comments  Delfino Lovett is starting to consider retiring earlier as he has been required to work more and misses his time off. This has been his primary stressor.  He is sleeping well and uses CPAP nightly to be able to sleep.  He gets about 4-7 hours each night.  He uses a strap to keep it on well.    Expected Outcomes  Short: Continue to improve sleep night Long: Continue to look forward to retirement    Interventions  Stress management education;Encouraged to attend Cardiac Rehabilitation for the exercise       Vocational Rehabilitation: Provide vocational rehab assistance to qualifying candidates.   Vocational Rehab Evaluation & Intervention: Vocational Rehab - 02/21/20 5573      Initial Vocational Rehab Evaluation & Intervention   Assessment shows need for Vocational Rehabilitation  --   not sure yet      Education: Education Goals: Education classes will be provided on a variety of topics geared toward better understanding of heart health and risk factor modification. Participant will state understanding/return demonstration of topics presented as noted by education test scores.  Learning Barriers/Preferences: Learning Barriers/Preferences - 02/21/20 2202      Learning Barriers/Preferences   Learning Barriers  Sight   glasses   Learning Preferences  Skilled Demonstration;Written Material       General Cardiac Education Topics:  AED/CPR: - Group verbal and written instruction with the use of models to demonstrate the basic use of the AED with the basic ABC's of resuscitation.   Anatomy & Physiology of the  Heart: - Group verbal and written instruction and models provide basic cardiac anatomy and physiology, with the coronary electrical and arterial systems. Review of Valvular disease and Heart Failure   Cardiac Procedures: - Group verbal and written instruction to review commonly prescribed medications for heart disease. Reviews the medication, class of the drug, and side effects. Includes the steps to properly store meds and maintain the prescription regimen. (beta blockers and  nitrates)   Cardiac Medications I: - Group verbal and written instruction to review commonly prescribed medications for heart disease. Reviews the medication, class of the drug, and side effects. Includes the steps to properly store meds and maintain the prescription regimen.   Cardiac Medications II: -Group verbal and written instruction to review commonly prescribed medications for heart disease. Reviews the medication, class of the drug, and side effects. (all other drug classes)    Go Sex-Intimacy & Heart Disease, Get SMART - Goal Setting: - Group verbal and written instruction through game format to discuss heart disease and the return to sexual intimacy. Provides group verbal and written material to discuss and apply goal setting through the application of the S.M.A.R.T. Method.   Other Matters of the Heart: - Provides group verbal, written materials and models to describe Stable Angina and Peripheral Artery. Includes description of the disease process and treatment options available to the cardiac patient.   Infection Prevention: - Provides verbal and written material to individual with discussion of infection control including proper hand washing and proper equipment cleaning during exercise session.   Cardiac Rehab from 02/22/2020 in [facility] Cardiac and Pulmonary Rehab  Date  02/22/20  Educator  [facility]  Instruction Review Code  1- Verbalizes Understanding      Falls Prevention: - Provides verbal and written  material to individual with discussion of falls prevention and safety.   Cardiac Rehab from 02/22/2020 in [facility] Cardiac and Pulmonary Rehab  Date  02/22/20  Educator  [facility]  Instruction Review Code  1- Verbalizes Understanding      Other: -Provides group and verbal instruction on various topics (see comments)   Knowledge Questionnaire Score: Knowledge Questionnaire Score - 02/22/20 1204      Knowledge Questionnaire Score   Pre Score  24/26 Education Focus: Nutrition, Depression       Core Components/Risk Factors/Patient Goals at Admission: Personal Goals and Risk Factors at Admission - 02/22/20 1205      Core Components/Risk Factors/Patient Goals on Admission    Weight Management  Yes;Weight Loss    Intervention  Weight Management: Develop a combined nutrition and exercise program designed to reach desired caloric intake, while maintaining appropriate intake of nutrient and fiber, sodium and fats, and appropriate energy expenditure required for the weight goal.;Weight Management: Provide education and appropriate resources to help participant work on and attain dietary goals.    Admit Weight  218 lb (98.9 kg)    Goal Weight: Short Term  213 lb (96.6 kg)    Goal Weight: Long Term  200 lb (90.7 kg)    Expected Outcomes  Short Term: Continue to assess and modify interventions until short term weight is achieved;Long Term: Adherence to nutrition and physical activity/exercise program aimed toward attainment of established weight goal;Weight Loss: Understanding of general recommendations for a balanced deficit meal plan, which promotes 1-2 lb weight loss per week and includes a negative energy balance of [phone] kcal/d;Understanding recommendations for meals to include 15-35% energy as protein, 25-35% energy from fat, 35-60% energy from carbohydrates, less than 220m of dietary cholesterol, 20-35 gm of total fiber daily;Understanding of distribution of calorie intake throughout the day with the  consumption of 4-5 meals/snacks    Diabetes  Yes    Intervention  Provide education about signs/symptoms and action to take for hypo/hyperglycemia.;Provide education about proper nutrition, including hydration, and aerobic/resistive exercise prescription along with prescribed medications to achieve blood glucose in normal ranges: Fasting glucose 65-99 mg/dL    Expected  Outcomes  Short Term: Participant verbalizes understanding of the signs/symptoms and immediate care of hyper/hypoglycemia, proper foot care and importance of medication, aerobic/resistive exercise and nutrition plan for blood glucose control.;Long Term: Attainment of HbA1C < 7%.    Hypertension  Yes    Intervention  Provide education on lifestyle modifcations including regular physical activity/exercise, weight management, moderate sodium restriction and increased consumption of fresh fruit, vegetables, and low fat dairy, alcohol moderation, and smoking cessation.;Monitor prescription use compliance.    Expected Outcomes  Short Term: Continued assessment and intervention until BP is < 140/24m HG in hypertensive participants. < 130/838mHG in hypertensive participants with diabetes, heart failure or chronic kidney disease.;Long Term: Maintenance of blood pressure at goal levels.    Lipids  Yes    Intervention  Provide education and support for participant on nutrition & aerobic/resistive exercise along with prescribed medications to achieve LDL <704mHDL >46m109m  Expected Outcomes  Short Term: Participant states understanding of desired cholesterol values and is compliant with medications prescribed. Participant is following exercise prescription and nutrition guidelines.;Long Term: Cholesterol controlled with medications as prescribed, with individualized exercise RX and with personalized nutrition plan. Value goals: LDL < 70mg5mL > 40 mg.       Education:Diabetes - Individual verbal and written instruction to review signs/symptoms  of diabetes, desired ranges of glucose level fasting, after meals and with exercise. Acknowledge that pre and post exercise glucose checks will be done for 3 sessions at entry of program.   [facility]  Date  02/21/20  Educ[facility]  [facility]  1- Verbalizes Understanding      Education: Know Your Numbers and Risk Factors: -Group verbal and written instruction about important numbers in your health.  Discussion of what are risk factors and how they play a role in the disease process.  Review of Cholesterol, Blood Pressure, Diabetes, and BMI and the role they play in your overall health.   Core Components/Risk Factors/Patient Goals Review:  Goals and Risk Factor Review    Row Name 03/08/20 0939 04/08/20 0954           Core Components/Risk Factors/Patient Goals Review   Personal Goals Review  Weight Management/Obesity;Hypertension;Diabetes  Weight Management/Obesity;Hypertension;Diabetes      Review  RichaDelfino Lovettoing well and losing weight.  He is down to 209lb but would like to lose more.  He really wants to lose more!!  He is working on diet and exercise.  His pressures have been good, meds are working!  Blood sugars are doing okay, 186 today after OJ last night when he was low. He will continue to work on this.  RichaDelfino Lovettoing well in rehab.  He is up some today as he had [place] night today he was 223llb but he is trying.  Getting in his [place].  His sugars are doing well overall, a little high today from eating out, but overall good around 140s.  His pressure was up today from extra salt yesterday.  Overall, it is doing well.  He is still checking his blood pressures and sugars at home.  His wife keeps him going and on task.      Expected Outcomes  Short: Continue to work on diabetes management and weight loss  Long: Continue to monitor risk factors.  Short: Continue to get weight back down.  Long: Continue  to monitor risk factors.  Core Components/Risk Factors/Patient Goals at Discharge (Final Review):  Goals and Risk Factor Review - 04/08/20 0954      Core Components/Risk Factors/Patient Goals Review   Personal Goals Review  Weight Management/Obesity;Hypertension;Diabetes    Review  Delfino Lovett is doing well in rehab.  He is up some today as he had [place] last night today he was 223llb but he is trying.  Getting in his [place] in monthly.  His sugars are doing well overall, a little high today from eating out, but overall good around 140s.  His pressure was up today from extra salt yesterday.  Overall, it is doing well.  He is still checking his blood pressures and sugars at home.  His wife keeps him going and on task.    Expected Outcomes  Short: Continue to get weight back down.  Long: Continue to monitor risk factors.       ITP Comments: ITP Comments    Row Name 02/21/20 0957 02/22/20 1154 02/27/20 1114 02/28/20 1031 03/20/20 0617   ITP Comments  Completed virtual orientation today.  EP evaluation is scheduled for Thursday 2/25  at 930am.  Documentation for diagnosis can be found in CE encounter 01/26/20.  Completed 6MWT and gym orientation.  Initial ITP created and sent for review to Dr. Emily Filbert, Medical Director.  Completed Initial RD Eval  First full day of exercise!  Patient was oriented to gym and equipment including functions, settings, policies, and procedures.  Patient's individual exercise prescription and treatment plan were reviewed.  All starting workloads were established based on the results of the 6 minute walk test done at initial orientation visit.  The plan for exercise progression was also introduced and progression will be customized based on patient's performance and goals  30 day chart review completed. ITP sent to Dr Zachery Dakins Medical Director, for review,changes as needed and signature. Continue with ITP if no changes requested   [place] Name 04/17/20 0601  05/10/20 1047 05/15/20 0600 05/22/20 1011     ITP Comments  30 Day review completed. Medical Director review done, changes made as directed,and approval shown by signature of Market researcher.  **EKG changes noted during therapy from prior EKGs. Patient denies any chest pain or other cardiac symptoms throughout duration of therapy. Patient's heart rate 80's-90's and SBP 120-140s. Patient educated to seek medical provider if cardiac symptoms occur**  30 Day review completed. ITP review done, changes made as directed,and approval shown by signature of  Scientist, research (life sciences).  Eames graduated today from  rehab with 36 sessions completed.  Details of the patient's exercise prescription and what He needs to do in order to continue the prescription and progress were discussed with patient.  Patient was given a copy of prescription and goals.  Patient verbalized understanding.  Christapher plans to continue to exercise by walking at home       Comments: Discharge ITP

## 2020-05-22 NOTE — Progress Notes (Signed)
Discharge Progress Report  Patient Details  Name: Joshua Haley MRN: [id number] Date of Birth: September 04, 1951 Referring Provider:     Cardiac Rehab from 02/22/2020 in [facility]  Referring Provider  Wyline Mood MD ([place])       Number of Visits: 36  Reason for Discharge:  Patient reached a stable level of exercise. Patient independent in their exercise. Patient has met program and personal goals.  Smoking History:  Social History   Tobacco Use  Smoking Status Former Smoker  Smokeless Tobacco Never Used    Diagnosis:  S/P CABG x 2  ADL UCSD:   Initial Exercise Prescription: Initial Exercise Prescription - 02/22/20 1100      Date of Initial Exercise RX and Referring Provider   Date  02/22/20    Referring Provider  Wyline Mood MD ([place])      Treadmill   MPH  3    Grade  0.5    Minutes  15    METs  3.5      Elliptical   Level  1    Speed  3.2    Minutes  15      REL-XR   Level  3    Speed  50    Minutes  15    METs  3      T5 Nustep   Level  3    SPM  80    Minutes  15    METs  3      Prescription Details   Frequency (times per week)  3    Duration  Progress to 30 minutes of continuous aerobic without signs/symptoms of physical distress      Intensity   THRR 40-80% of Max Heartrate  114-139    Ratings of Perceived Exertion  11-13    Perceived Dyspnea  0-4      Progression   Progression  Continue to progress workloads to maintain intensity without signs/symptoms of physical distress.      Resistance Training   Training Prescription  Yes    Weight  3 lb    Reps  10-15       Discharge Exercise Prescription (Final Exercise Prescription Changes): Exercise Prescription Changes - 05/20/20 1500      Response to Exercise   Blood Pressure (Admit)  122/60    Blood Pressure (Exercise)  136/70    Blood Pressure (Exit)  124/70    Heart Rate (Admit)  109 bpm    Heart Rate (Exercise)  117 bpm    Heart Rate (Exit)  90 bpm     Rating of Perceived Exertion (Exercise)  11    Symptoms  none    Duration  Continue with 30 min of aerobic exercise without signs/symptoms of physical distress.    Intensity  THRR unchanged      Progression   Progression  Continue to progress workloads to maintain intensity without signs/symptoms of physical distress.    Average METs  5.22      Resistance Training   Training Prescription  Yes    Weight  12 lb    Reps  10-15      Interval Training   Interval Training  No      Treadmill   MPH  3.2    Grade  2    Minutes  15    METs  4.33      REL-XR   Level  6    Speed  50  Minutes  15    METs  6.1      Home Exercise Plan   Plans to continue exercise at  Home (comment)   walking, staff videos   Frequency  Add 2 additional days to program exercise sessions.    Initial Home Exercises Provided  02/29/20       Functional Capacity: 6 Minute Walk    Row Name 02/22/20 1157 05/08/20 1114       6 Minute Walk   Phase  Initial  Discharge    Distance  1445 feet  1786 feet    Walk Time  6 minutes  6 minutes    # of Rest Breaks  0  0    MPH  2.74  3.38    METS  3.45  3.9    RPE  8  13    VO2 Peak  12.08  13.64    Symptoms  Yes (comment)  No    Comments  little burning from deeper breathing  --    Resting HR  88 bpm  77 bpm    Resting BP  108/64  120/70    Resting Oxygen Saturation   97 %  --    Exercise Oxygen Saturation  during 6 min walk  98 %  --    Max Ex. HR  113 bpm  103 bpm    Max Ex. BP  142/60  126/74    2 Minute Post BP  128/64  --       Psychological, QOL, Others - Outcomes: PHQ 2/9: Depression screen PHQ 2/9 02/22/2020  Decreased Interest 0  Down, Depressed, Hopeless 0  PHQ - 2 Score 0  Altered sleeping 0  Tired, decreased energy 0  Change in appetite 0  Feeling bad or failure about yourself  0  Trouble concentrating 0  Moving slowly or fidgety/restless 0  PHQ-9 Score 0  Difficult doing work/chores Not difficult at all    Quality of  Life: Quality of Life - 02/22/20 1204      Quality of Life   Select  Quality of Life      Quality of Life Scores   Health/Function Pre  27.2 %    Socioeconomic Pre  26.75 %    Psych/Spiritual Pre  28.21 %    Family Pre  25.2 %    GLOBAL Pre  27.01 %       Nutrition & Weight - Outcomes: Pre Biometrics - 02/22/20 1201      Pre Biometrics   Height  6' 0.5" (1.842 m)    Weight  218 lb (98.9 kg)    BMI (Calculated)  29.14    Single Leg Stand  30 seconds      Post Biometrics - 05/08/20 1120       Post  Biometrics   Height  6' 0.5" (1.842 m)    Weight  229 lb 4.8 oz (104 kg)    BMI (Calculated)  30.65       Nutrition: Nutrition Therapy & Goals - 02/27/20 1101      Nutrition Therapy   Diet  HH, DM, Low Na    Protein (specify units)  90-95g    Fiber  30 grams    Whole Grain Foods  3 servings    Saturated Fats  12 max. grams    Fruits and Vegetables  5 servings/day    Sodium  1.5 grams      Personal Nutrition Goals   Nutrition  Goal  ST: water LT: weight maintenance    Comments  Not on insulin, last A1c 8, doesn't have new one yet. B: 2 eggs w/ some cheese and some spring mix - 2 oz of oatmeal, grilled chicken sometimes. Coffee w/ stevia. S: fruit  L:moes (beans, cilantro, salad) D: wraps with [place] and cheese with mustard (low carb wrap)S: low sugar ice cream Drinks: sugar free gatorade, sugar free ginger ale. Discussed HH and DM friendly eating. Suggested lowering Na, adding vegetables to dinner, adding protein or fat to fruit snack, discussed adding water.      Intervention Plan   Intervention  Prescribe, educate and counsel regarding individualized specific dietary modifications aiming towards targeted core components such as weight, hypertension, lipid management, diabetes, heart failure and other comorbidities.;Nutrition handout(s) given to patient.    Expected Outcomes  Short Term Goal: Understand basic principles of dietary content, such as calories, fat, sodium,  cholesterol and nutrients.;Short Term Goal: A plan has been developed with personal nutrition goals set during dietitian appointment.;Long Term Goal: Adherence to prescribed nutrition plan.       Nutrition Discharge: Nutrition Assessments - 02/22/20 1204      MEDFICTS Scores   Pre Score  14       Education Questionnaire Score: Knowledge Questionnaire Score - 02/22/20 1204      Knowledge Questionnaire Score   Pre Score  24/26 Education Focus: Nutrition, Depression       Goals reviewed with patient; copy given to patient.

## 2020-09-24 IMAGING — CR DG CHEST 2V
2 series · 2 of 2 positions shown · non-contrast
Comparison: Chest radiographs 01/19/2020.

CLINICAL DATA: 69-year-old male with chest pain, upper extremity
numbness on the left.

EXAM:
CHEST - 2 VIEW

[chest pa]
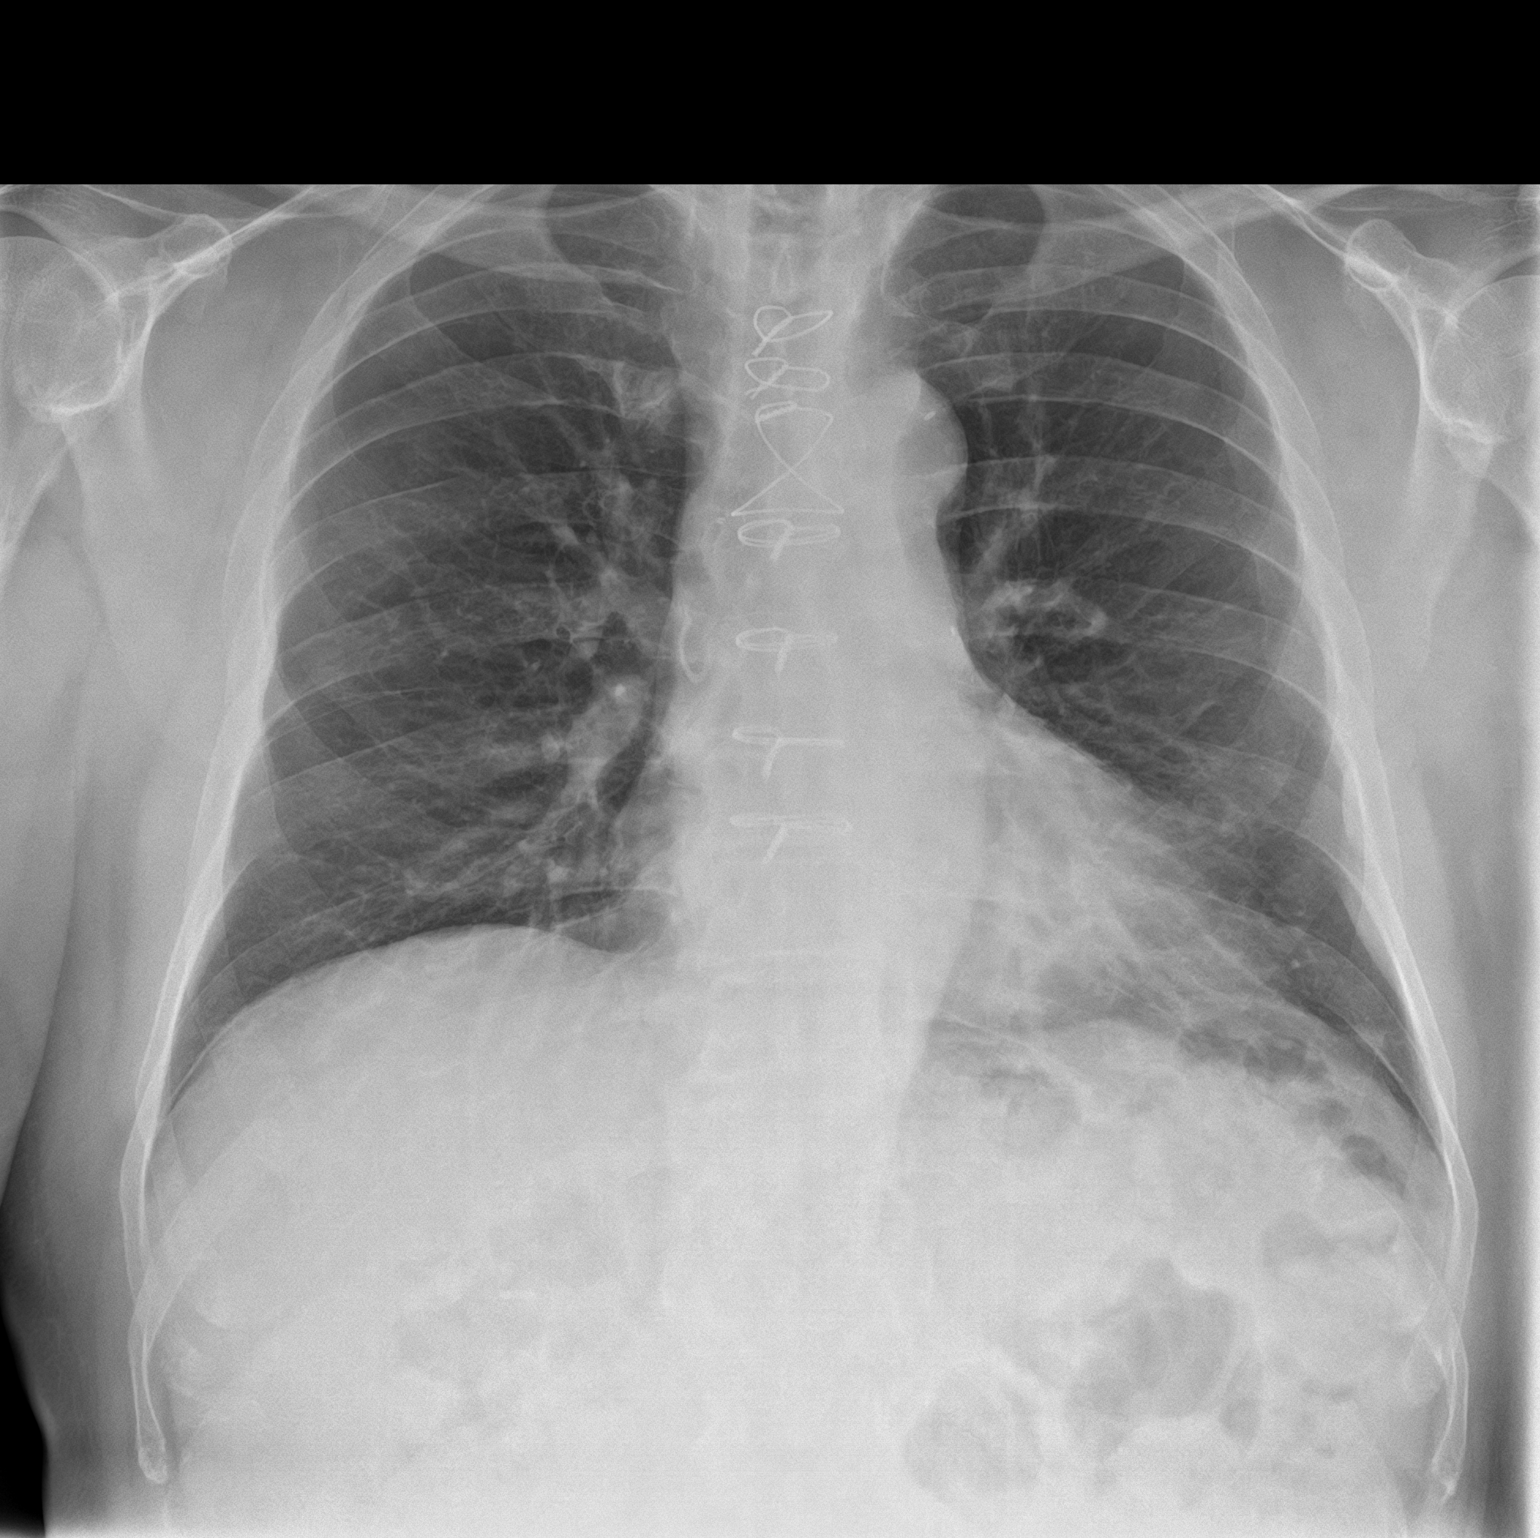

[chest lat]
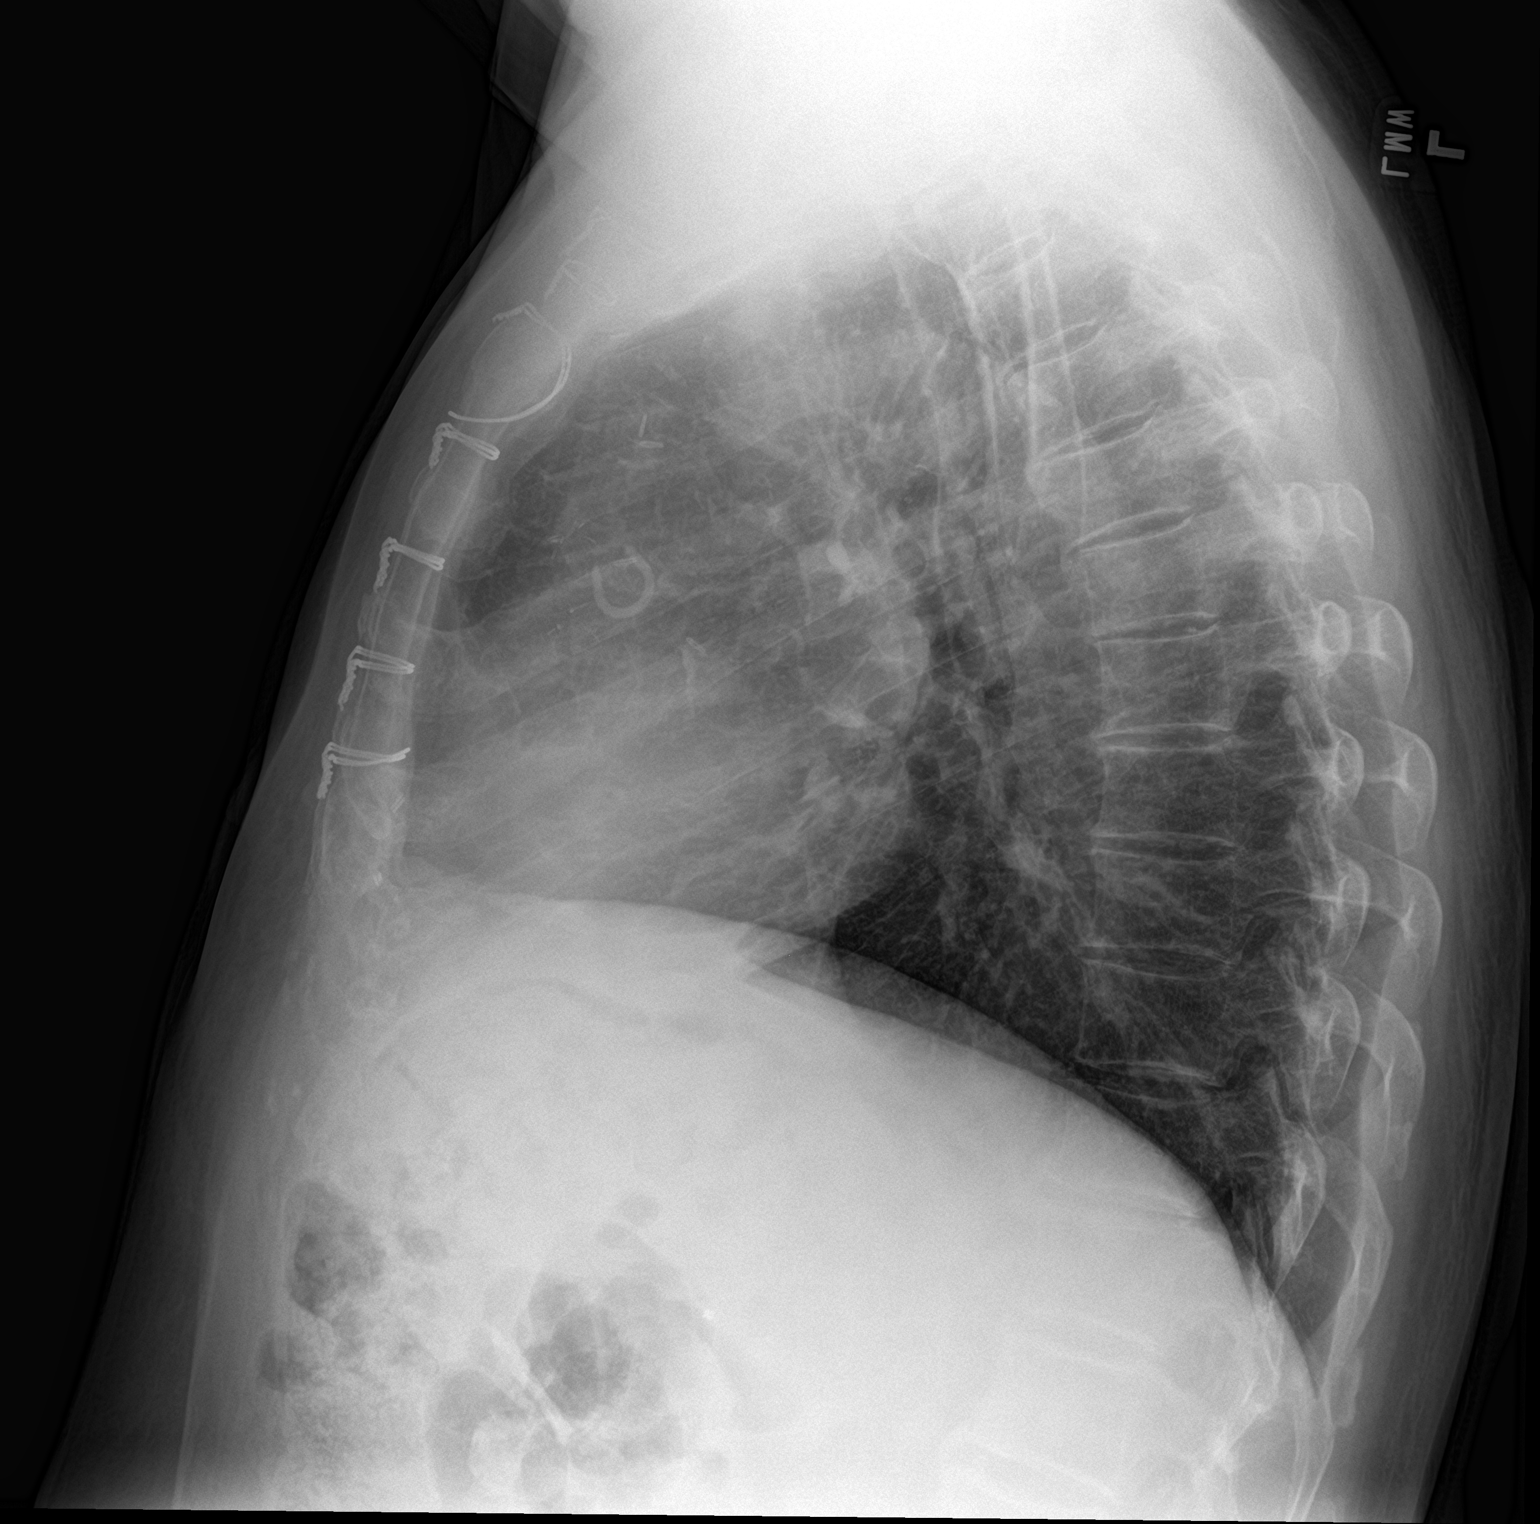

[2 of 2 positions shown; findings below may reference images not displayed]

FINDINGS: Interval median sternotomy/CABG. Mediastinal contours remain within
normal limits. Lung volumes are normal. Visualized tracheal air
column is within normal limits. No pneumothorax, pulmonary edema,
pleural effusion or confluent pulmonary opacity.

No acute osseous abnormality identified. Negative visible bowel gas
pattern. Stable cholecystectomy clips.
IMPRESSION: Interval CABG.  No acute cardiopulmonary abnormality.

## 2021-05-14 ENCOUNTER — Other Ambulatory Visit: Payer: Self-pay

## 2021-05-14 ENCOUNTER — Emergency Department: Payer: No Typology Code available for payment source

## 2021-05-14 ENCOUNTER — Emergency Department
Admission: EM | Admit: 2021-05-14 | Discharge: 2021-05-14 | Disposition: A | Discharge status: Home / Self Care | Payer: No Typology Code available for payment source | Attending: Emergency Medicine | Admitting: Emergency Medicine

## 2021-05-14 DIAGNOSIS — Z794 Long term (current) use of insulin: Secondary | ICD-10-CM | POA: Insufficient documentation

## 2021-05-14 DIAGNOSIS — R079 Chest pain, unspecified: Secondary | ICD-10-CM

## 2021-05-14 DIAGNOSIS — E119 Type 2 diabetes mellitus without complications: Secondary | ICD-10-CM | POA: Diagnosis not present

## 2021-05-14 DIAGNOSIS — Z7982 Long term (current) use of aspirin: Secondary | ICD-10-CM | POA: Insufficient documentation

## 2021-05-14 DIAGNOSIS — Z955 Presence of coronary angioplasty implant and graft: Secondary | ICD-10-CM | POA: Insufficient documentation

## 2021-05-14 DIAGNOSIS — I10 Essential (primary) hypertension: Secondary | ICD-10-CM | POA: Insufficient documentation

## 2021-05-14 DIAGNOSIS — E039 Hypothyroidism, unspecified: Secondary | ICD-10-CM | POA: Insufficient documentation

## 2021-05-14 DIAGNOSIS — Z87891 Personal history of nicotine dependence: Secondary | ICD-10-CM | POA: Diagnosis not present

## 2021-05-14 DIAGNOSIS — Z7984 Long term (current) use of oral hypoglycemic drugs: Secondary | ICD-10-CM | POA: Insufficient documentation

## 2021-05-14 DIAGNOSIS — Z79899 Other long term (current) drug therapy: Secondary | ICD-10-CM | POA: Diagnosis not present

## 2021-05-14 DIAGNOSIS — R0789 Other chest pain: Secondary | ICD-10-CM | POA: Diagnosis not present

## 2021-05-14 LAB — BASIC METABOLIC PANEL
Anion gap: 11 (ref 5–15)
BUN: 18 mg/dL (ref 8–23)
CO2: 21 mmol/L — ABNORMAL LOW (ref 22–32)
Calcium: 9.6 mg/dL (ref 8.9–10.3)
Chloride: 105 mmol/L (ref 98–111)
Creatinine, Ser: 0.89 mg/dL (ref 0.61–1.24)
GFR, Estimated: >60 mL/min (ref >60)
Glucose, Bld: 212 mg/dL — ABNORMAL HIGH (ref 70–99)
Potassium: 3.9 mmol/L (ref 3.5–5.1)
Sodium: 137 mmol/L (ref 135–145)

## 2021-05-14 LAB — CBC
HCT: 38.1 % — ABNORMAL LOW (ref 39.0–52.0)
Hemoglobin: 11.6 g/dL — ABNORMAL LOW (ref 13.0–17.0)
MCH: 21.9 pg — ABNORMAL LOW (ref 26.0–34.0)
MCHC: 30.4 g/dL (ref 30.0–36.0)
MCV: 71.9 fL — ABNORMAL LOW (ref 80.0–100.0)
Platelets: 306 10*3/uL (ref 150–400)
RBC: 5.3 MIL/uL (ref 4.22–5.81)
RDW: 17.7 % — ABNORMAL HIGH (ref 11.5–15.5)
WBC: 6.4 10*3/uL (ref 4.0–10.5)
nRBC: 0 % (ref 0.0–0.2)

## 2021-05-14 LAB — TROPONIN I (HIGH SENSITIVITY): Troponin I (High Sensitivity): 4 ng/L (ref <18)

## 2021-05-14 MED ORDER — IOHEXOL 350 MG/ML SOLN
75.0000 mL | Freq: Once | INTRAVENOUS | Status: AC | PRN
  Administered 2021-05-14: 75 mL via INTRAVENOUS
  Filled 2021-05-14: qty 75

## 2021-05-14 NOTE — ED Provider Notes (Signed)
[facility] Emergency Department Provider Note  Time seen: 6:14 PM  I have reviewed the triage vital signs and the nursing notes.   HISTORY  Chief Complaint Chest Pain   HPI Joshua Haley is a 70 y.o. male with a past medical history of diabetes, hypertension, gastric reflux, presents to the emergency department for chest pain.  According to the patient over the past week or so he has been experiencing intermittent pain in his chest which she describes as a sharp pain in the center of his back radiating at times to the front of his chest somewhat worse with movement or deep inspiration.  Patient denies any cough.  Denies any nausea or diaphoresis.  No leg pain or swelling.  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   No history of DVTs previously.  Past Medical History:  Diagnosis Date  . Diabetes mellitus without complication (HCC)   . Kidney stone     Patient Active Problem List   Diagnosis Date Noted  . T2DM (type 2 diabetes mellitus) (HCC) 01/20/2020  . Nephrolithiasis 01/20/2020  . Hypothyroidism 01/20/2020  . GERD (gastroesophageal reflux disease) 01/20/2020  . HTN (hypertension) 01/20/2020  . Chest pain 01/20/2020    Past Surgical History:  Procedure Laterality Date  . CHOLECYSTECTOMY    . LEFT HEART CATH AND CORONARY ANGIOGRAPHY N/A 01/22/2020   Procedure: LEFT HEART CATH AND CORONARY ANGIOGRAPHY and possible PCI and stent;  Surgeon: Alwyn Pea, MD;  Location: [facility] INVASIVE CV LAB;  Service: Cardiovascular;  Laterality: N/A;    Prior to Admission medications   Medication Sig Start Date End Date Taking? Authorizing Provider  aspirin 81 MG chewable tablet Chew 81 mg by mouth daily.    [provider]  Cholecalciferol (VITAMIN D3) 125 MCG (5000 UT) CAPS Take 5,000 Units by mouth daily.    [provider]  Cyanocobalamin 1000 MCG/15ML LIQD Take 1,000 mcg by mouth daily.    [provider]  Ferrous Sulfate (IRON) 28 MG TABS Take 1 tablet (28 mg total) by mouth daily. Can take any over the counter iron supplements after discharge. 01/22/20   Darlin Priestly, MD  glipiZIDE (GLUCOTROL) 5 MG tablet Take by mouth. 01/30/20 01/29/21  [provider]  glyBURIDE (DIABETA) 5 MG tablet Hold while inpatient. 01/22/20   Darlin Priestly, MD  insulin glargine (LANTUS) 100 UNIT/ML injection Inject 0.15 mLs (15 Units total) into the skin daily. Patient not taking: Reported on 02/21/2020 01/23/20   Darlin Priestly, MD  isosorbide dinitrate (ISORDIL) 10 MG tablet Take by mouth. 01/30/20 01/29/21  [provider]  ketoconazole (NIZORAL) 2 % cream Apply 1 application topically 2 (two) times daily.    [provider]  ketorolac (ACULAR) 0.4 % SOLN Place 1 drop into the right eye 4 (four) times daily as needed (pain). 06/15/18   Merrily Brittle, MD  levothyroxine (SYNTHROID) 137 MCG tablet Take 137 mcg by mouth daily before breakfast.    [provider]  lisinopril (ZESTRIL) 20 MG tablet Take 1 tablet (20 mg total) by mouth daily. 01/22/20   Darlin Priestly, MD  meloxicam (MOBIC) 15 MG tablet Take 1 tablet (15 mg total) by mouth daily as needed for pain. 01/22/20   Darlin Priestly, MD  metFORMIN (GLUCOPHAGE) 1000 MG tablet Hold while inpatient. 01/22/20   Darlin Priestly, MD  metoprolol succinate (TOPROL-XL) 50 MG 24 hr tablet Take 50 mg by mouth daily. Take with  or immediately following a meal.    [provider]  metoprolol tartrate (LOPRESSOR) 25 MG tablet Take by mouth. 01/30/20 01/29/21  [provider]  pantoprazole (PROTONIX) 40 MG tablet Take 40 mg by mouth daily.    [provider]    Allergies  Allergen Reactions  . Morphine And Related   . Penicillins Other (See Comments)    No family history on file.  Social History Social History   Tobacco Use  . Smoking status: Former Games developer  . Smokeless tobacco: Never Used  Substance Use Topics  . Alcohol use: Not  Currently    Review of Systems Constitutional: Negative for fever. Cardiovascular: Sharp chest pain radiating posteriorly and anteriorly. Respiratory: Negative for shortness of breath. Gastrointestinal: Negative for abdominal pain, vomiting  Musculoskeletal: Back pain is somewhat worse with movement. Skin: Negative for skin complaints  Neurological: Negative for headache All other ROS negative  ____________________________________________   PHYSICAL EXAM:  VITAL SIGNS: ED Triage Vitals  Enc Vitals Group     BP 05/14/21 1618 121/83     Pulse Rate 05/14/21 1618 79     Resp 05/14/21 1618 19     Temp 05/14/21 1618 98.8 F (37.1 C)     Temp src --      SpO2 05/14/21 1618 97 %     Weight --      Height --      Head Circumference --      Peak Flow --      Pain Score 05/14/21 1616 3     Pain Loc --      Pain Edu? --      Excl. in GC? --     Constitutional: Alert and oriented. Well appearing and in no distress. Eyes: Normal exam ENT      Head: Normocephalic and atraumatic.      Mouth/Throat: Mucous membranes are moist. Cardiovascular: Normal rate, regular rhythm.  Respiratory: Normal respiratory effort without tachypnea nor retractions. Breath sounds are clear Gastrointestinal: Soft and nontender. No distention.  Musculoskeletal: Nontender with normal range of motion in all extremities. No lower extremity tenderness or edema. Neurologic:  Normal speech and language. No gross focal neurologic deficits  Skin:  Skin is warm, dry and intact.  Psychiatric: Mood and affect are normal.   ____________________________________________    EKG  EKG viewed and interpreted by myself shows sinus rhythm at 79 bpm with a narrow QRS, normal axis, normal intervals, nonspecific ST changes without ST elevation.  ____________________________________________    RADIOLOGY  Chest x-ray is negative CTA is negative  ____________________________________________   INITIAL IMPRESSION /  ASSESSMENT AND PLAN / ED COURSE  Pertinent labs & imaging results that were available during my care of the patient were reviewed by me and considered in my medical decision making (see chart for details).   Patient presents emergency department for chest pain mostly posterior chest radiating at times to the front of her chest worse with deep inspiration or movement.  Patient is a Naval architect.  Differential would include ACS, PE, musculoskeletal pain, pneumonia.  Work-up thus far is reassuring including a negative troponin and a normal chest x-ray as well as nonspecific EKG.  However given the patient's chest pain sharp worse with inspiration we will proceed with a CTA of the chest to rule out pulmonary embolism.  Patient agreeable to plan of care.  Patient's work-up is overall reassuring.  Lab work largely within normal limits including a negative troponin.  CT is  resulted negative as well.  Patient will be discharged home with PCP follow-up.  Patient reassured.  Discussed medical chest pain return precautions.  KOBEY SIDES was evaluated in Emergency Department on 05/14/2021 for the symptoms described in the history of present illness. He was evaluated in the context of the global COVID-19 pandemic, which necessitated consideration that the patient might be at risk for infection with the SARS-CoV-2 virus that causes COVID-19. Institutional protocols and algorithms that pertain to the evaluation of patients at risk for COVID-19 are in a state of rapid change based on information released by regulatory bodies including the CDC and federal and state organizations. These policies and algorithms were followed during the patient's care in the ED.  ____________________________________________   FINAL CLINICAL IMPRESSION(S) / ED DIAGNOSES  Chest pain   Minna Antis, MD 05/14/21 1858

## 2021-05-14 NOTE — ED Notes (Signed)
Pt verbalized understanding of d/c instructions at this time. Pt given opportunity to ask questions as needed. Pt ambulatory to ED lobby, NAD noted, steady gait noted, RR even and unlabored

## 2021-05-14 NOTE — ED Notes (Signed)
Pt to CT at this time.

## 2021-05-14 NOTE — ED Triage Notes (Signed)
Pt comes with w/o right sided CP and back pain. Pt states this started few days ago. Pt states the pain is getting worse.

## 2021-05-14 NOTE — ED Notes (Signed)
See triage note. Pt states back pain and R sided chest pain that started 2-3 days ago. Pt states pain gets worse when getting out of his truck or standing up.   Pt is worried that he broke a rib. Pt also c/o constant, unproductive cough for the last few months.   Pt wears cpap at night, has missed a few nights wearing it due to cleaning it.

## 2021-09-23 IMAGING — CT CT ANGIO CHEST
2 of 6 series · 18 of 46 positions shown · IV contrast (APPLIED)
Comparison: None.

CLINICAL DATA: Right-sided chest pain for several days. Clinical
suspicion for pulmonary embolism.

EXAM:
CT ANGIOGRAPHY CHEST WITH CONTRAST
TECHNIQUE: Multidetector CT imaging of the chest was performed using the
standard protocol during bolus administration of intravenous
contrast. Multiplanar CT image reconstructions and MIPs were
obtained to evaluate the vascular anatomy.
CONTRAST:  75mL OMNIPAQUE IOHEXOL 350 MG/ML SOLN

[Series 6: thins · axial · 0.92mm/px · z∈[-694,-430]mm · 15 of 290 slices shown]
[im 13/290  lung]
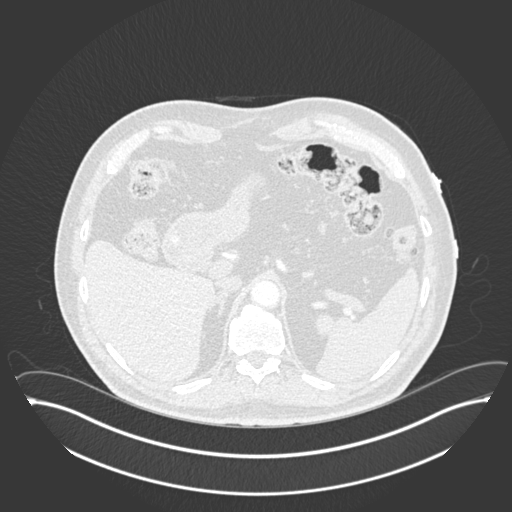
[im 38/290  soft-tissue]
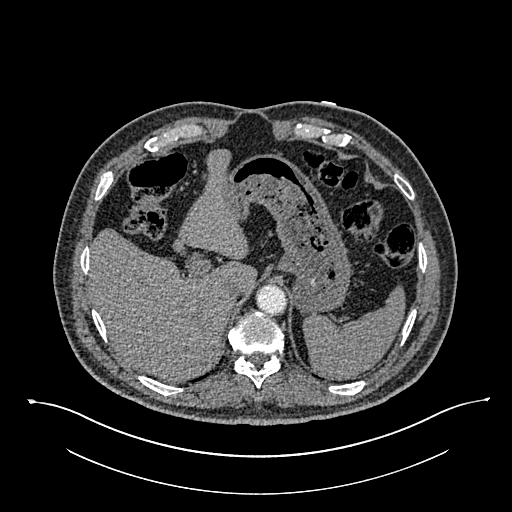
[im 51/290  lung]
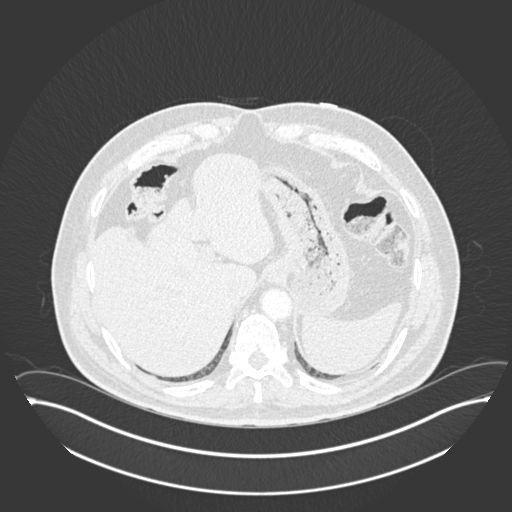
[im 76/290  soft-tissue]
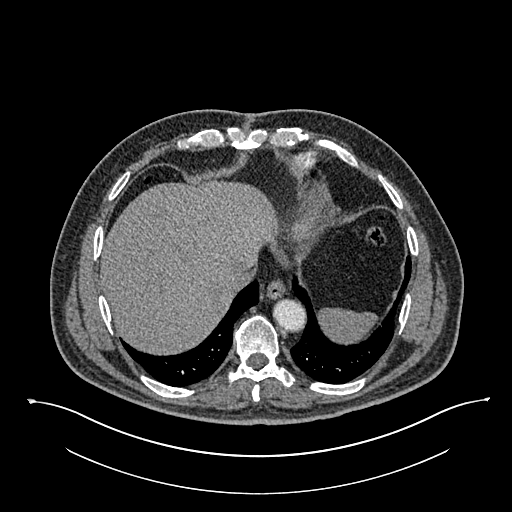
[im 88/290  lung]
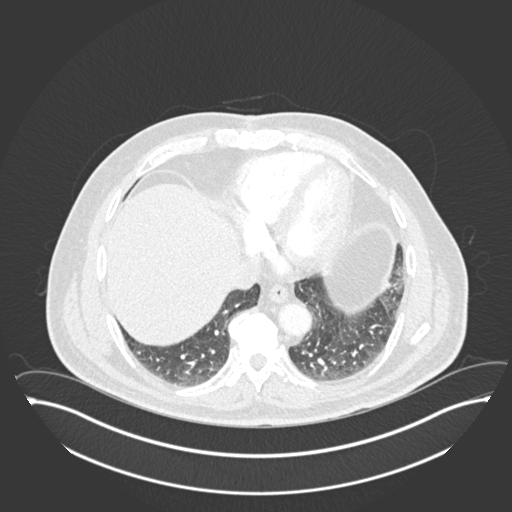
[im 114/290  soft-tissue]
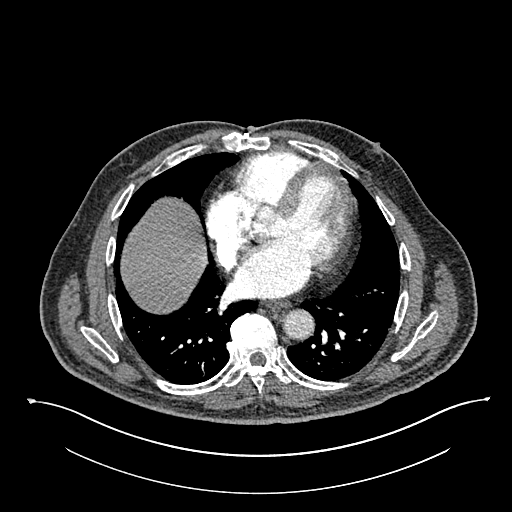
[im 126/290  lung]
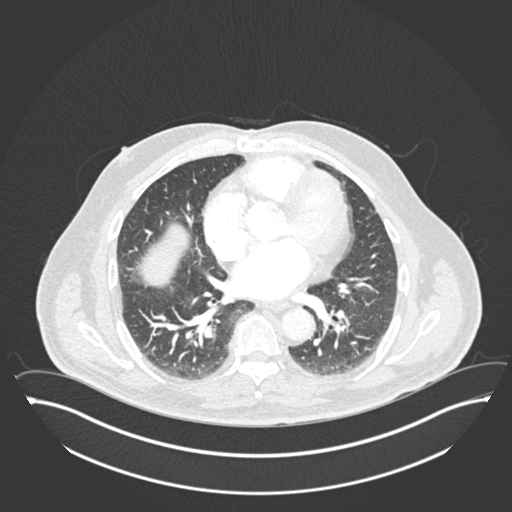
[im 151/290  soft-tissue]
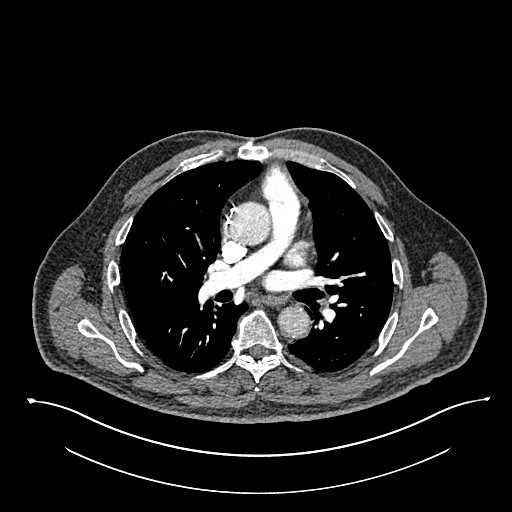
[im 164/290  lung]
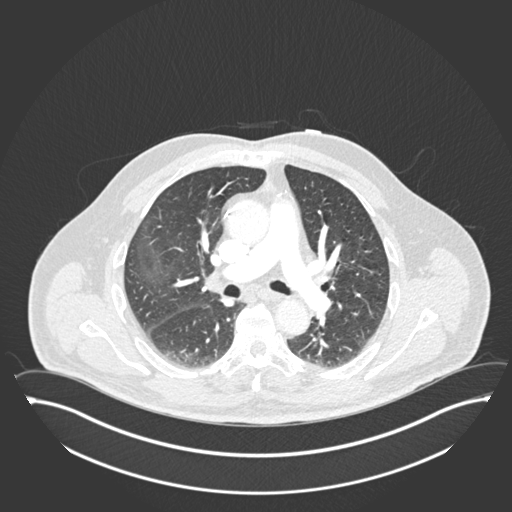
[im 176/290  soft-tissue]
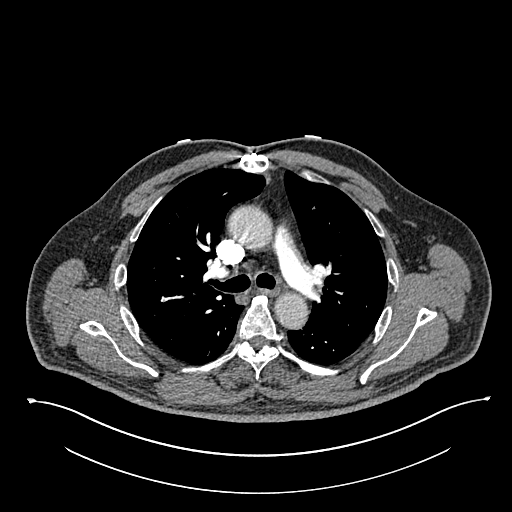
[im 202/290  lung]
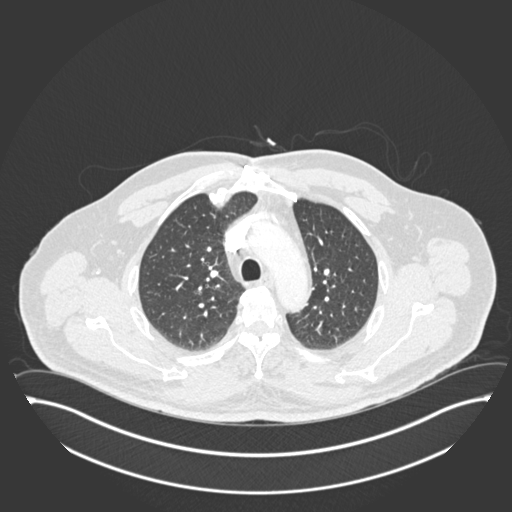
[im 214/290  soft-tissue]
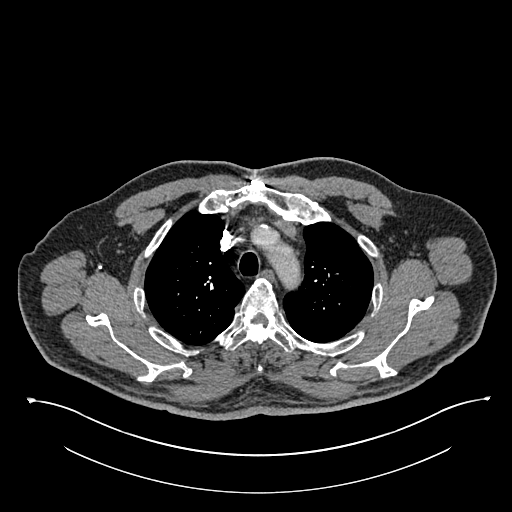
[im 239/290  lung]
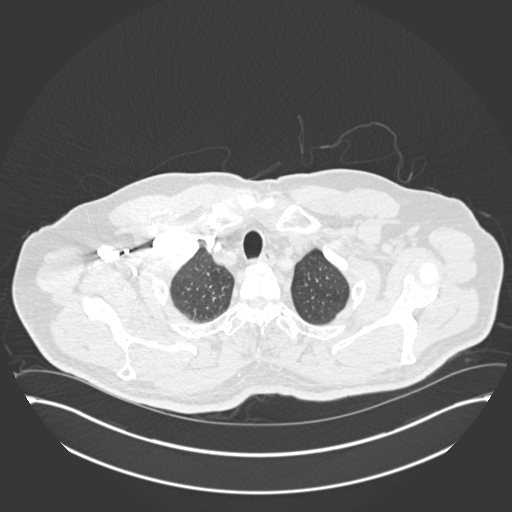
[im 252/290  soft-tissue]
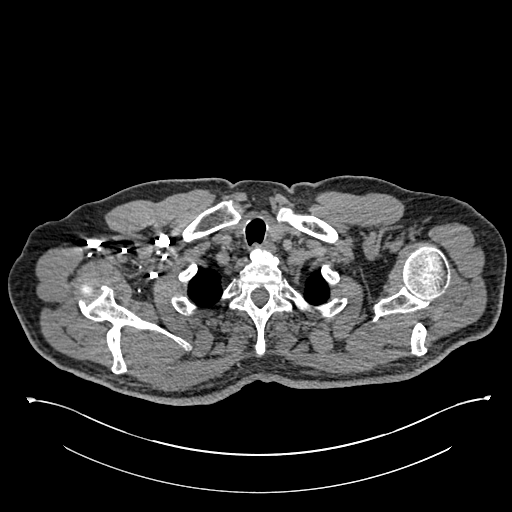
[im 277/290  lung]
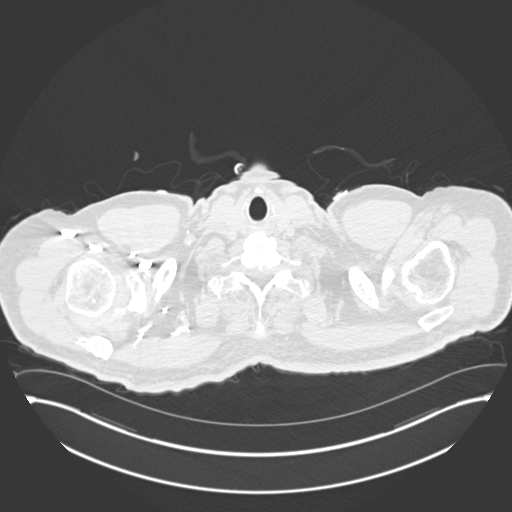

[Series 8: coronal mpr · coronal · 0.57mm/px · 3 of 106 slices shown]
[im 27/106  soft-tissue]
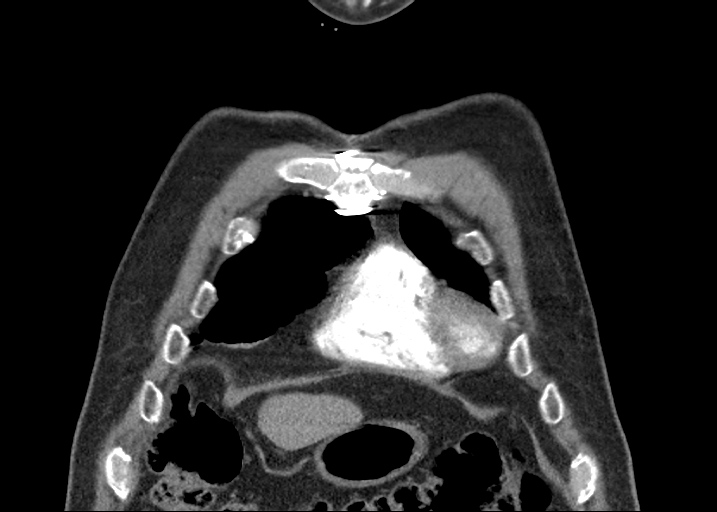
[im 53/106  soft-tissue]
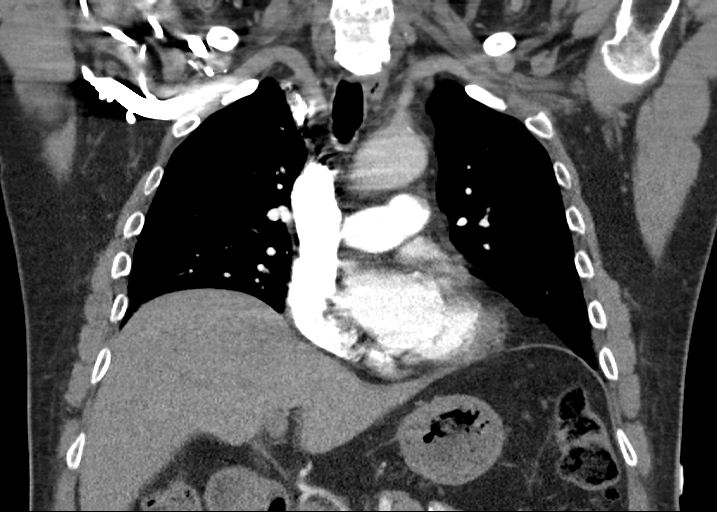
[im 79/106  soft-tissue]
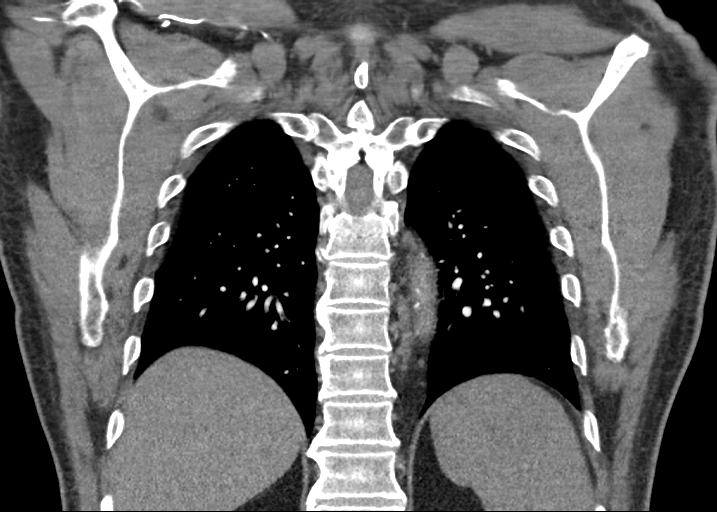

[18 of 46 positions shown; findings below may reference images not displayed]

FINDINGS: Cardiovascular: Satisfactory opacification of pulmonary arteries
noted, and no pulmonary emboli identified. No evidence of thoracic
aortic dissection or aneurysm. Aortic and coronary atherosclerotic
calcification noted, with prior CABG.

Mediastinum/Nodes: No masses or pathologically enlarged lymph nodes
identified.

Lungs/Pleura: No pulmonary mass, infiltrate, or effusion.

Upper abdomen: No acute findings.

Musculoskeletal: No suspicious bone lesions identified.

Review of the MIP images confirms the above findings.
IMPRESSION: No evidence of pulmonary embolism. No active disease within the
thorax.

Aortic Atherosclerosis ([id number]-XQ6.6).

## 2022-06-19 ENCOUNTER — Inpatient Hospital Stay
Admit: 2022-06-19 | Discharge: 2022-07-10 | Disposition: A | Discharge status: Other Acute Inpatient Hospital | Payer: PRIVATE HEALTH INSURANCE | Source: Other Acute Inpatient Hospital | Attending: Internal Medicine | Admitting: Internal Medicine

## 2022-06-19 ENCOUNTER — Institutional Professional Consult (permissible substitution) (HOSPITAL_COMMUNITY): Payer: No Typology Code available for payment source

## 2022-06-19 DIAGNOSIS — J690 Pneumonitis due to inhalation of food and vomit: Secondary | ICD-10-CM

## 2022-06-19 DIAGNOSIS — Z930 Tracheostomy status: Secondary | ICD-10-CM

## 2022-06-19 DIAGNOSIS — S06302S Unspecified focal traumatic brain injury with loss of consciousness of 31 minutes to 59 minutes, sequela: Secondary | ICD-10-CM

## 2022-06-19 DIAGNOSIS — J189 Pneumonia, unspecified organism: Secondary | ICD-10-CM

## 2022-06-19 DIAGNOSIS — J9621 Acute and chronic respiratory failure with hypoxia: Secondary | ICD-10-CM

## 2022-06-19 DIAGNOSIS — G40909 Epilepsy, unspecified, not intractable, without status epilepticus: Secondary | ICD-10-CM

## 2022-06-20 DIAGNOSIS — Z930 Tracheostomy status: Secondary | ICD-10-CM | POA: Diagnosis not present

## 2022-06-20 DIAGNOSIS — J690 Pneumonitis due to inhalation of food and vomit: Secondary | ICD-10-CM | POA: Diagnosis not present

## 2022-06-20 DIAGNOSIS — G40909 Epilepsy, unspecified, not intractable, without status epilepticus: Secondary | ICD-10-CM

## 2022-06-20 DIAGNOSIS — J9621 Acute and chronic respiratory failure with hypoxia: Secondary | ICD-10-CM

## 2022-06-20 LAB — BASIC METABOLIC PANEL
Anion gap: 9 (ref 5–15)
BUN: 18 mg/dL (ref 8–23)
CO2: 29 mmol/L (ref 22–32)
Calcium: 9.8 mg/dL (ref 8.9–10.3)
Chloride: 108 mmol/L (ref 98–111)
Creatinine, Ser: 0.6 mg/dL — ABNORMAL LOW (ref 0.61–1.24)
GFR, Estimated: >60 mL/min (ref >60)
Glucose, Bld: 235 mg/dL — ABNORMAL HIGH (ref 70–99)
Potassium: 3.6 mmol/L (ref 3.5–5.1)
Sodium: 146 mmol/L — ABNORMAL HIGH (ref 135–145)

## 2022-06-20 LAB — CBC
HCT: 46.9 % (ref 39.0–52.0)
Hemoglobin: 14.8 g/dL (ref 13.0–17.0)
MCH: 27.8 pg (ref 26.0–34.0)
MCHC: 31.6 g/dL (ref 30.0–36.0)
MCV: 88.2 fL (ref 80.0–100.0)
Platelets: 227 10*3/uL (ref 150–400)
RBC: 5.32 MIL/uL (ref 4.22–5.81)
RDW: 16.4 % — ABNORMAL HIGH (ref 11.5–15.5)
WBC: 6.3 10*3/uL (ref 4.0–10.5)
nRBC: 0 % (ref 0.0–0.2)

## 2022-06-21 DIAGNOSIS — Z930 Tracheostomy status: Secondary | ICD-10-CM | POA: Diagnosis not present

## 2022-06-21 DIAGNOSIS — J189 Pneumonia, unspecified organism: Secondary | ICD-10-CM

## 2022-06-21 DIAGNOSIS — J690 Pneumonitis due to inhalation of food and vomit: Secondary | ICD-10-CM

## 2022-06-21 DIAGNOSIS — G40909 Epilepsy, unspecified, not intractable, without status epilepticus: Secondary | ICD-10-CM

## 2022-06-21 DIAGNOSIS — S06302S Unspecified focal traumatic brain injury with loss of consciousness of 31 minutes to 59 minutes, sequela: Secondary | ICD-10-CM

## 2022-06-21 DIAGNOSIS — J9621 Acute and chronic respiratory failure with hypoxia: Secondary | ICD-10-CM | POA: Diagnosis not present

## 2022-06-21 LAB — BASIC METABOLIC PANEL
Anion gap: 9 (ref 5–15)
BUN: 19 mg/dL (ref 8–23)
CO2: 29 mmol/L (ref 22–32)
Calcium: 9.9 mg/dL (ref 8.9–10.3)
Chloride: 111 mmol/L (ref 98–111)
Creatinine, Ser: 0.59 mg/dL — ABNORMAL LOW (ref 0.61–1.24)
GFR, Estimated: >60 mL/min (ref >60)
Glucose, Bld: 226 mg/dL — ABNORMAL HIGH (ref 70–99)
Potassium: 3.8 mmol/L (ref 3.5–5.1)
Sodium: 149 mmol/L — ABNORMAL HIGH (ref 135–145)

## 2022-06-21 NOTE — Progress Notes (Signed)
Pulmonary Critical Care Medicine [facility]   PULMONARY CRITICAL CARE SERVICE  PROGRESS NOTE     Joshua Haley  WUJ:[id number]  DOB: 06-Apr-1951   DOA: 06/19/2022  Referring Physician: Luna Kitchens, MD  HPI: Joshua Haley is a 71 y.o. male being followed for ventilator/airway/oxygen weaning Acute on Chronic Respiratory Failure.  Patient is on T collar this morning on 28% FiO2 remains comfortable is nonverbal basically  Medications: Reviewed on Rounds  Physical Exam:  Vitals: Temperature is 99.3 pulse 96 respiratory is 14 blood pressure is 145/81 saturations 97%  Ventilator Settings on T collar 28% FiO2  General: Comfortable at this time Neck: supple Cardiovascular: no malignant arrhythmias Respiratory: Scattered rhonchi expansion is equal Skin: no rash seen on limited exam Musculoskeletal: No gross abnormality Psychiatric:unable to assess Neurologic:no involuntary movements         Lab Data:   Basic Metabolic Panel: Recent Labs  Lab 06/20/22 0328  NA 146*  K 3.6  CL 108  CO2 29  GLUCOSE 235*  BUN 18  CREATININE 0.60*  CALCIUM 9.8    ABG: No results for input(s): "PHART", "PCO2ART", "PO2ART", "HCO3", "O2SAT" in the last 168 hours.  Liver Function Tests: No results for input(s): "AST", "ALT", "ALKPHOS", "BILITOT", "PROT", "ALBUMIN" in the last 168 hours. No results for input(s): "LIPASE", "AMYLASE" in the last 168 hours. No results for input(s): "AMMONIA" in the last 168 hours.  CBC: Recent Labs  Lab 06/20/22 0328  WBC 6.3  HGB 14.8  HCT 46.9  MCV 88.2  PLT 227    Cardiac Enzymes: No results for input(s): "CKTOTAL", "CKMB", "CKMBINDEX", "TROPONINI" in the last 168 hours.  BNP (last 3 results) No results for input(s): "BNP" in the last 8760 hours.  ProBNP (last 3 results) No results for input(s): "PROBNP" in the last 8760 hours.  Radiological Exams: DG ABDOMEN PEG TUBE LOCATION  Result Date: 06/19/2022 CLINICAL DATA:   Peg tube placement EXAM: ABDOMEN - 1 VIEW COMPARISON:  None Available. FINDINGS: Gastrostomy tube projects over the proximal stomach. Contrast seen within the stomach and proximal duodenum. No contrast extravasation. No organomegaly or free air. IMPRESSION: Gastrostomy tube within the stomach.  No contrast extravasation. Electronically Signed   By: Charlett Nose M.D.   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  It is fairly evident that the patient has had a long-term tracheostomy since at least last December Aspiration pneumonia patient does remain at risk for aspiration we will continue to monitor closely. Seizure disorder no sign of active seizures at this time patient does have some shaking which is not seizure clear-cut Traumatic brain injury sequela no change we will continue to follow Tracheostomy remains in place at this time   I have personally seen and evaluated the patient, evaluated laboratory and imaging results, formulated the assessment and plan and placed orders. The Patient requires high complexity decision making with multiple systems involvement.  Rounds were done with the Respiratory Therapy Director and Staff therapists and discussed with nursing staff also.  Yevonne Pax, MD [facility] Pulmonary Critical Care  Medicine Sleep Medicine

## 2022-06-22 DIAGNOSIS — J690 Pneumonitis due to inhalation of food and vomit: Secondary | ICD-10-CM | POA: Diagnosis not present

## 2022-06-22 DIAGNOSIS — G40909 Epilepsy, unspecified, not intractable, without status epilepticus: Secondary | ICD-10-CM | POA: Diagnosis not present

## 2022-06-22 DIAGNOSIS — J9621 Acute and chronic respiratory failure with hypoxia: Secondary | ICD-10-CM | POA: Diagnosis not present

## 2022-06-22 DIAGNOSIS — Z930 Tracheostomy status: Secondary | ICD-10-CM | POA: Diagnosis not present

## 2022-06-22 LAB — URINALYSIS, ROUTINE W REFLEX MICROSCOPIC
Bilirubin Urine: NEGATIVE
Glucose, UA: 50 mg/dL — AB
Hgb urine dipstick: NEGATIVE
Ketones, ur: NEGATIVE mg/dL
Nitrite: NEGATIVE
Protein, ur: 100 mg/dL — AB
Specific Gravity, Urine: 1.03 (ref 1.005–1.030)
WBC, UA: >50 WBC/hpf — ABNORMAL HIGH (ref 0–5)
pH: 5 (ref 5.0–8.0)

## 2022-06-22 LAB — LEVETIRACETAM LEVEL: Levetiracetam Lvl: 10.8 ug/mL (ref 10.0–40.0)

## 2022-06-23 DIAGNOSIS — G40909 Epilepsy, unspecified, not intractable, without status epilepticus: Secondary | ICD-10-CM | POA: Diagnosis not present

## 2022-06-23 DIAGNOSIS — S06302S Unspecified focal traumatic brain injury with loss of consciousness of 31 minutes to 59 minutes, sequela: Secondary | ICD-10-CM | POA: Diagnosis not present

## 2022-06-23 DIAGNOSIS — J9621 Acute and chronic respiratory failure with hypoxia: Secondary | ICD-10-CM | POA: Diagnosis not present

## 2022-06-23 DIAGNOSIS — J690 Pneumonitis due to inhalation of food and vomit: Secondary | ICD-10-CM | POA: Diagnosis not present

## 2022-06-23 LAB — BLOOD CULTURE ID PANEL (REFLEXED) - BCID2

## 2022-06-23 LAB — SODIUM: Sodium: 146 mmol/L — ABNORMAL HIGH (ref 135–145)

## 2022-06-23 LAB — CREATININE, URINE, RANDOM: Creatinine, Urine: 129.69 mg/dL

## 2022-06-23 LAB — COMPREHENSIVE METABOLIC PANEL
ALT: 30 U/L (ref 0–44)
AST: 15 U/L (ref 15–41)
Albumin: 2.6 g/dL — ABNORMAL LOW (ref 3.5–5.0)
Alkaline Phosphatase: 109 U/L (ref 38–126)
Anion gap: 13 (ref 5–15)
BUN: 24 mg/dL — ABNORMAL HIGH (ref 8–23)
CO2: 29 mmol/L (ref 22–32)
Calcium: 10.2 mg/dL (ref 8.9–10.3)
Chloride: 108 mmol/L (ref 98–111)
Creatinine, Ser: 0.6 mg/dL — ABNORMAL LOW (ref 0.61–1.24)
GFR, Estimated: >60 mL/min (ref >60)
Glucose, Bld: 274 mg/dL — ABNORMAL HIGH (ref 70–99)
Potassium: 3.6 mmol/L (ref 3.5–5.1)
Sodium: 150 mmol/L — ABNORMAL HIGH (ref 135–145)
Total Bilirubin: 0.9 mg/dL (ref 0.3–1.2)
Total Protein: 6.5 g/dL (ref 6.5–8.1)

## 2022-06-23 LAB — MAGNESIUM: Magnesium: 1.6 mg/dL — ABNORMAL LOW (ref 1.7–2.4)

## 2022-06-23 LAB — T4, FREE
Free T4: 0.68 ng/dL (ref 0.61–1.12)
Free T4: 0.64 ng/dL (ref 0.61–1.12)

## 2022-06-23 LAB — TSH
TSH: 2.416 u[IU]/mL (ref 0.350–4.500)
TSH: 2.158 u[IU]/mL (ref 0.350–4.500)

## 2022-06-23 LAB — SODIUM, URINE, RANDOM: Sodium, Ur: 16 mmol/L

## 2022-06-24 DIAGNOSIS — G40909 Epilepsy, unspecified, not intractable, without status epilepticus: Secondary | ICD-10-CM | POA: Diagnosis not present

## 2022-06-24 DIAGNOSIS — J9621 Acute and chronic respiratory failure with hypoxia: Secondary | ICD-10-CM | POA: Diagnosis not present

## 2022-06-24 DIAGNOSIS — J690 Pneumonitis due to inhalation of food and vomit: Secondary | ICD-10-CM | POA: Diagnosis not present

## 2022-06-24 DIAGNOSIS — S06302S Unspecified focal traumatic brain injury with loss of consciousness of 31 minutes to 59 minutes, sequela: Secondary | ICD-10-CM | POA: Diagnosis not present

## 2022-06-24 LAB — URINE CULTURE: Culture: 60000 — AB

## 2022-06-24 NOTE — Progress Notes (Signed)
Pulmonary Critical Care Medicine [facility]   PULMONARY CRITICAL CARE SERVICE  PROGRESS NOTE     Joshua Haley  [id number]  DOB: 08-Jun-1951   DOA: 06/19/2022  Referring Physician: Luna Kitchens, MD  HPI: Joshua Haley is a 71 y.o. male being followed for ventilator/airway/oxygen weaning Acute on Chronic Respiratory Failure.  Patient is off the ventilator on T collar appears to be at baseline  Medications: Reviewed on Rounds  Physical Exam:  Vitals: Temperature is 99.0 pulse of 86 respiratory rate is 17 blood pressure is 122/65 saturations 98%  Ventilator Settings on T collar off the ventilator  General: Comfortable at this time Neck: supple Cardiovascular: no malignant arrhythmias Respiratory: No rhonchi no rales are noted at this time Skin: no rash seen on limited exam Musculoskeletal: No gross abnormality Psychiatric:unable to assess Neurologic:no involuntary movements         Lab Data:   Basic Metabolic Panel: Recent Labs  Lab 06/20/22 0328 06/21/22 1245 06/23/22 0425 06/23/22 1644  NA 146* 149* 150* 146*  K 3.6 3.8 3.6  --   CL 108 111 108  --   CO2 29 29 29   --   GLUCOSE 235* 226* 274*  --   BUN 18 19 24*  --   CREATININE 0.60* 0.59* 0.60*  --   CALCIUM 9.8 9.9 10.2  --   MG  --   --  1.6*  --     ABG: No results for input(s): "PHART", "PCO2ART", "PO2ART", "HCO3", "O2SAT" in the last 168 hours.  Liver Function Tests: Recent Labs  Lab 06/23/22 0425  AST 15  ALT 30  ALKPHOS 109  BILITOT 0.9  PROT 6.5  ALBUMIN 2.6*   No results for input(s): "LIPASE", "AMYLASE" in the last 168 hours. No results for input(s): "AMMONIA" in the last 168 hours.  CBC: Recent Labs  Lab 06/20/22 0328  WBC 6.3  HGB 14.8  HCT 46.9  MCV 88.2  PLT 227    Cardiac Enzymes: No results for input(s): "CKTOTAL", "CKMB", "CKMBINDEX", "TROPONINI" in the last 168 hours.  BNP (last 3 results) No results for input(s): "BNP" in the last 8760  hours.  ProBNP (last 3 results) No results for input(s): "PROBNP" in the last 8760 hours.  Radiological Exams: No results found.  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 The Patient requires high complexity decision making with multiple systems involvement.  Rounds were done with the Respiratory Therapy Director and Staff therapists and discussed with nursing staff also.  06/22/22, MD [facility] Pulmonary Critical Care Medicine Sleep Medicine

## 2022-06-25 ENCOUNTER — Inpatient Hospital Stay (HOSPITAL_BASED_OUTPATIENT_CLINIC_OR_DEPARTMENT_OTHER)
Admit: 2022-06-25 | Discharge: 2022-06-25 | Disposition: A | Discharge status: Home / Self Care | Payer: No Typology Code available for payment source | Source: Other Acute Inpatient Hospital | Attending: Internal Medicine | Admitting: Internal Medicine

## 2022-06-25 DIAGNOSIS — G40909 Epilepsy, unspecified, not intractable, without status epilepticus: Secondary | ICD-10-CM | POA: Diagnosis not present

## 2022-06-25 DIAGNOSIS — J9621 Acute and chronic respiratory failure with hypoxia: Secondary | ICD-10-CM | POA: Diagnosis not present

## 2022-06-25 DIAGNOSIS — R569 Unspecified convulsions: Secondary | ICD-10-CM

## 2022-06-25 DIAGNOSIS — J690 Pneumonitis due to inhalation of food and vomit: Secondary | ICD-10-CM | POA: Diagnosis not present

## 2022-06-25 DIAGNOSIS — Z930 Tracheostomy status: Secondary | ICD-10-CM | POA: Diagnosis not present

## 2022-06-25 LAB — T3, FREE
T3, Free: 2.7 pg/mL (ref 2.0–4.4)
T3, Free: 2.4 pg/mL (ref 2.0–4.4)

## 2022-06-25 LAB — CBC
HCT: 42.5 % (ref 39.0–52.0)
Hemoglobin: 13.3 g/dL (ref 13.0–17.0)
MCH: 27.4 pg (ref 26.0–34.0)
MCHC: 31.3 g/dL (ref 30.0–36.0)
MCV: 87.6 fL (ref 80.0–100.0)
Platelets: 189 10*3/uL (ref 150–400)
RBC: 4.85 MIL/uL (ref 4.22–5.81)
RDW: 16.5 % — ABNORMAL HIGH (ref 11.5–15.5)
WBC: 8.9 10*3/uL (ref 4.0–10.5)
nRBC: 0 % (ref 0.0–0.2)

## 2022-06-25 LAB — BASIC METABOLIC PANEL
Anion gap: 8 (ref 5–15)
BUN: 27 mg/dL — ABNORMAL HIGH (ref 8–23)
CO2: 30 mmol/L (ref 22–32)
Calcium: 9.2 mg/dL (ref 8.9–10.3)
Chloride: 102 mmol/L (ref 98–111)
Creatinine, Ser: 0.56 mg/dL — ABNORMAL LOW (ref 0.61–1.24)
GFR, Estimated: >60 mL/min (ref >60)
Glucose, Bld: 269 mg/dL — ABNORMAL HIGH (ref 70–99)
Potassium: 3.5 mmol/L (ref 3.5–5.1)
Sodium: 140 mmol/L (ref 135–145)

## 2022-06-25 LAB — CULTURE, BLOOD (ROUTINE X 2): Special Requests: ADEQUATE

## 2022-06-25 LAB — MAGNESIUM: Magnesium: 1.7 mg/dL (ref 1.7–2.4)

## 2022-06-25 NOTE — Procedures (Signed)
Patient Name: OSLO HUNTSMAN  MRN: [id number]  Epilepsy Attending: Charlsie Quest  Referring Physician/Provider: Shayne Alken, MD Date: 06/25/2022 Duration: 22.36 mins  Patient history: 71yo M undergoing eeg to evaluate for seizure  Level of alertness:  lethargic   AEDs during EEG study: None  Technical aspects: This EEG study was done with scalp electrodes positioned according to the 10-20 International system of electrode placement. Electrical activity was acquired at a sampling rate of 500Hz  and reviewed with a high frequency filter of 70Hz  and a low frequency filter of 1Hz . EEG data were recorded continuously and digitally stored.   Description: No posterior dominant rhythm was seen. EEG showed continuous generalized and maximal right temporal region 3 to 6 Hz theta-delta slowing. Sharp transient were noted in right temporal region.  Hyperventilation and photic stimulation were not performed.     ABNORMALITY - Continuous slow, generalized and maximal right temporal region  IMPRESSION: This study is suggestive of cortical dysfunction arising from right temporal region likely secondary to underlying structural abnormality. Additionally there is moderate to severe diffuse encephalopathy, nonspecific etiology. No seizures or definite epileptiform discharges were seen throughout the recording.  Taaliyah Delpriore 

## 2022-06-25 NOTE — Progress Notes (Addendum)
Pulmonary Critical Care Medicine [facility]   PULMONARY CRITICAL CARE SERVICE  PROGRESS NOTE     Joshua Haley  ZJI:[id number]  DOB: Apr 29, 1951   DOA: 06/19/2022  Referring Physician: Luna Kitchens, MD  HPI: Joshua Haley is a 71 y.o. male being followed for ventilator/airway/oxygen weaning Acute on Chronic Respiratory Failure.  Patient seen lying in bed, currently on ATC 40%, his baseline.  Continues to have secretions.  No acute distress, no acute overnight events.  Medications: Reviewed on Rounds  Physical Exam:  Vitals: Temp 98.9, pulse 88, respirations 18, BP 126/85, SPO2 99%  Ventilator Settings ATC 40%  General: Comfortable at this time Neck: supple Cardiovascular: no malignant arrhythmias Respiratory: Bilaterally diminished Skin: no rash seen on limited exam Musculoskeletal: No gross abnormality Psychiatric:unable to assess Neurologic:no involuntary movements         Lab Data:   Basic Metabolic Panel: Recent Labs  Lab 06/20/22 0328 06/21/22 1245 06/23/22 0425 06/23/22 1644 06/25/22 0554  NA 146* 149* 150* 146* 140  K 3.6 3.8 3.6  --  3.5  CL 108 111 108  --  102  CO2 29 29 29   --  30  GLUCOSE 235* 226* 274*  --  269*  BUN 18 19 24*  --  27*  CREATININE 0.60* 0.59* 0.60*  --  0.56*  CALCIUM 9.8 9.9 10.2  --  9.2  MG  --   --  1.6*  --  1.7    ABG: No results for input(s): "PHART", "PCO2ART", "PO2ART", "HCO3", "O2SAT" in the last 168 hours.  Liver Function Tests: Recent Labs  Lab 06/23/22 0425  AST 15  ALT 30  ALKPHOS 109  BILITOT 0.9  PROT 6.5  ALBUMIN 2.6*   No results for input(s): "LIPASE", "AMYLASE" in the last 168 hours. No results for input(s): "AMMONIA" in the last 168 hours.  CBC: Recent Labs  Lab 06/20/22 0328 06/25/22 0554  WBC 6.3 8.9  HGB 14.8 13.3  HCT 46.9 42.5  MCV 88.2 87.6  PLT 227 189    Cardiac Enzymes: No results for input(s): "CKTOTAL", "CKMB", "CKMBINDEX", "TROPONINI" in the last  168 hours.  BNP (last 3 results) No results for input(s): "BNP" in the last 8760 hours.  ProBNP (last 3 results) No results for input(s): "PROBNP" in the last 8760 hours.  Radiological Exams: No results found.  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  He does continue to have moderate amount of thick secretions present, continue with pulmonary toilet. Seizure disorder-no signs of active seizures.  Continue with medical management and seizure precautions. Aspiration pneumonia-has been treated and is showing some improvement though it is slow.  Remains high risk for reaspiration events.  Continue aspiration precautions. Traumatic brain injury status post cranioplasty-no overall changes noted. Tracheostomy-stable and remains in place.  Continue with trach care per protocol.   I have personally seen and evaluated the patient, evaluated laboratory and imaging results, formulated the assessment and plan and placed orders. The Patient requires high complexity decision making with multiple systems involvement.  Rounds were done with the Respiratory Therapy Director and Staff therapists and discussed with nursing staff also.  06/27/22, MD  [facility] Pulmonary Critical Care Medicine Sleep Medicine

## 2022-06-25 NOTE — Progress Notes (Signed)
EEG complete - results pending 

## 2022-06-26 DIAGNOSIS — J9621 Acute and chronic respiratory failure with hypoxia: Secondary | ICD-10-CM | POA: Diagnosis not present

## 2022-06-26 DIAGNOSIS — G40909 Epilepsy, unspecified, not intractable, without status epilepticus: Secondary | ICD-10-CM | POA: Diagnosis not present

## 2022-06-26 DIAGNOSIS — Z930 Tracheostomy status: Secondary | ICD-10-CM | POA: Diagnosis not present

## 2022-06-26 DIAGNOSIS — J690 Pneumonitis due to inhalation of food and vomit: Secondary | ICD-10-CM | POA: Diagnosis not present

## 2022-06-26 LAB — CULTURE, BLOOD (ROUTINE X 2): Special Requests: ADEQUATE

## 2022-06-26 NOTE — Progress Notes (Cosign Needed)
Pulmonary Critical Care Medicine [facility]   PULMONARY CRITICAL CARE SERVICE  PROGRESS NOTE     Joshua Haley  WUJ:[id number]  DOB: 14-Nov-1951   DOA: 06/19/2022  Referring Physician: Luna Kitchens, MD  HPI: Joshua Haley is a 71 y.o. male being followed for ventilator/airway/oxygen weaning Acute on Chronic Respiratory Failure.  Patient seen lying in bed, currently remains on T-bar 40%.  Continues to have thick secretions present.  No acute distress, no acute overnight events.  Medications: Reviewed on Rounds  Physical Exam:  Vitals: Temp 98.0, pulse 86, respirations 23, BP 107/56, SPO2 95%  Ventilator Settings ATC 40%  General: Comfortable at this time Neck: supple Cardiovascular: no malignant arrhythmias Respiratory: Bilaterally diminished Skin: no rash seen on limited exam Musculoskeletal: No gross abnormality Psychiatric:unable to assess Neurologic:no involuntary movements         Lab Data:   Basic Metabolic Panel: Recent Labs  Lab 06/20/22 0328 06/21/22 1245 06/23/22 0425 06/23/22 1644 06/25/22 0554  NA 146* 149* 150* 146* 140  K 3.6 3.8 3.6  --  3.5  CL 108 111 108  --  102  CO2 29 29 29   --  30  GLUCOSE 235* 226* 274*  --  269*  BUN 18 19 24*  --  27*  CREATININE 0.60* 0.59* 0.60*  --  0.56*  CALCIUM 9.8 9.9 10.2  --  9.2  MG  --   --  1.6*  --  1.7    ABG: No results for input(s): "PHART", "PCO2ART", "PO2ART", "HCO3", "O2SAT" in the last 168 hours.  Liver Function Tests: Recent Labs  Lab 06/23/22 0425  AST 15  ALT 30  ALKPHOS 109  BILITOT 0.9  PROT 6.5  ALBUMIN 2.6*   No results for input(s): "LIPASE", "AMYLASE" in the last 168 hours. No results for input(s): "AMMONIA" in the last 168 hours.  CBC: Recent Labs  Lab 06/20/22 0328 06/25/22 0554  WBC 6.3 8.9  HGB 14.8 13.3  HCT 46.9 42.5  MCV 88.2 87.6  PLT 227 189    Cardiac Enzymes: No results for input(s): "CKTOTAL", "CKMB", "CKMBINDEX", "TROPONINI" in  the last 168 hours.  BNP (last 3 results) No results for input(s): "BNP" in the last 8760 hours.  ProBNP (last 3 results) No results for input(s): "PROBNP" in the last 8760 hours.  Radiological Exams: EEG adult  Result Date: 06/25/2022 06/27/2022, MD     06/25/2022  8:49 PM Patient Name: Joshua Haley MRN: Juliane Lack Epilepsy Attending: [id number] Referring Physician/Provider: Charlsie Quest, MD Date: 06/25/2022 Duration: 22.36 mins Patient history: 71yo M undergoing eeg to evaluate for seizure Level of alertness:  lethargic AEDs during EEG study: None Technical aspects: This EEG study was done with scalp electrodes positioned according to the 10-20 International system of electrode placement. Electrical activity was acquired at a sampling rate of 500Hz  and reviewed with a high frequency filter of 70Hz  and a low frequency filter of 1Hz . EEG data were recorded continuously and digitally stored. Description: No posterior dominant rhythm was seen. EEG showed continuous generalized and maximal right temporal region 3 to 6 Hz theta-delta slowing. Sharp transient were noted in right temporal region.  Hyperventilation and photic stimulation were not performed.   ABNORMALITY - Continuous slow, generalized and maximal right temporal region IMPRESSION: This study is suggestive of cortical dysfunction arising from right temporal region likely secondary to underlying structural abnormality. Additionally there is moderate to severe diffuse encephalopathy, nonspecific etiology. No  seizures or definite epileptiform discharges were seen throughout the recording. 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  He does continue to have moderate amount of thick secretions present, continue with pulmonary toilet. Seizure disorder-no signs of active seizures.  Patient scheduled to receive EEG today.  Continue with medical management and seizure precautions. Aspiration pneumonia-has been treated and is showing some improvement though it is slow.  Remains high risk for reaspiration events.  Continue aspiration precautions. Traumatic brain injury status post cranioplasty-no overall changes noted. Tracheostomy-stable and remains in place.  Continue with trach care per protocol.   I have personally seen and evaluated the patient, evaluated laboratory and imaging results, formulated the assessment and plan and placed orders. The Patient requires high complexity decision making with multiple systems involvement.  Rounds were done with the Respiratory Therapy Director and Staff therapists and discussed with nursing staff also.  Yevonne Pax, MD [facility] Pulmonary Critical Care Medicine Sleep Medicine

## 2022-06-27 DIAGNOSIS — G40909 Epilepsy, unspecified, not intractable, without status epilepticus: Secondary | ICD-10-CM | POA: Diagnosis not present

## 2022-06-27 DIAGNOSIS — J9621 Acute and chronic respiratory failure with hypoxia: Secondary | ICD-10-CM | POA: Diagnosis not present

## 2022-06-27 DIAGNOSIS — J690 Pneumonitis due to inhalation of food and vomit: Secondary | ICD-10-CM | POA: Diagnosis not present

## 2022-06-27 DIAGNOSIS — Z930 Tracheostomy status: Secondary | ICD-10-CM | POA: Diagnosis not present

## 2022-06-27 LAB — CBC
HCT: 41 % (ref 39.0–52.0)
Hemoglobin: 12.7 g/dL — ABNORMAL LOW (ref 13.0–17.0)
MCH: 27.1 pg (ref 26.0–34.0)
MCHC: 31 g/dL (ref 30.0–36.0)
MCV: 87.6 fL (ref 80.0–100.0)
Platelets: 228 10*3/uL (ref 150–400)
RBC: 4.68 MIL/uL (ref 4.22–5.81)
RDW: 16.5 % — ABNORMAL HIGH (ref 11.5–15.5)
WBC: 11.2 10*3/uL — ABNORMAL HIGH (ref 4.0–10.5)
nRBC: 0 % (ref 0.0–0.2)

## 2022-06-27 LAB — BASIC METABOLIC PANEL
Anion gap: 10 (ref 5–15)
BUN: 22 mg/dL (ref 8–23)
CO2: 26 mmol/L (ref 22–32)
Calcium: 9.2 mg/dL (ref 8.9–10.3)
Chloride: 107 mmol/L (ref 98–111)
Creatinine, Ser: 0.53 mg/dL — ABNORMAL LOW (ref 0.61–1.24)
GFR, Estimated: >60 mL/min (ref >60)
Glucose, Bld: 276 mg/dL — ABNORMAL HIGH (ref 70–99)
Potassium: 4.2 mmol/L (ref 3.5–5.1)
Sodium: 143 mmol/L (ref 135–145)

## 2022-06-27 LAB — MAGNESIUM: Magnesium: 1.8 mg/dL (ref 1.7–2.4)

## 2022-06-27 NOTE — Progress Notes (Cosign Needed)
Pulmonary Critical Care Medicine [facility]   PULMONARY CRITICAL CARE SERVICE  PROGRESS NOTE     Joshua Haley  UTM:[id number]  DOB: 04-07-1951   DOA: 06/19/2022  Referring Physician: Luna Kitchens, MD  HPI: Joshua Haley is a 71 y.o. male being followed for ventilator/airway/oxygen weaning Acute on Chronic Respiratory Failure.  Patient seen lying in bed, currently remains on T-bar 40%.  Thick secretions remain present.  No acute distress, no acute overnight events.  Medications: Reviewed on Rounds  Physical Exam:  Vitals: Temp 98.4, pulse 96, respirations 14, BP 136/72, SPO2 95%  Ventilator Settings T-bar 40%  General: Comfortable at this time Neck: supple Cardiovascular: no malignant arrhythmias Respiratory: Right lung coarse on expiration, left lung diminished Skin: no rash seen on limited exam Musculoskeletal: No gross abnormality Psychiatric:unable to assess Neurologic:no involuntary movements         Lab Data:   Basic Metabolic Panel: Recent Labs  Lab 06/21/22 1245 06/23/22 0425 06/23/22 1644 06/25/22 0554 06/27/22 0433  NA 149* 150* 146* 140 143  K 3.8 3.6  --  3.5 4.2  CL 111 108  --  102 107  CO2 29 29  --  30 26  GLUCOSE 226* 274*  --  269* 276*  BUN 19 24*  --  27* 22  CREATININE 0.59* 0.60*  --  0.56* 0.53*  CALCIUM 9.9 10.2  --  9.2 9.2  MG  --  1.6*  --  1.7 1.8    ABG: No results for input(s): "PHART", "PCO2ART", "PO2ART", "HCO3", "O2SAT" in the last 168 hours.  Liver Function Tests: Recent Labs  Lab 06/23/22 0425  AST 15  ALT 30  ALKPHOS 109  BILITOT 0.9  PROT 6.5  ALBUMIN 2.6*   No results for input(s): "LIPASE", "AMYLASE" in the last 168 hours. No results for input(s): "AMMONIA" in the last 168 hours.  CBC: Recent Labs  Lab 06/25/22 0554 06/27/22 0433  WBC 8.9 11.2*  HGB 13.3 12.7*  HCT 42.5 41.0  MCV 87.6 87.6  PLT 189 228    Cardiac Enzymes: No results for input(s): "CKTOTAL", "CKMB",  "CKMBINDEX", "TROPONINI" in the last 168 hours.  BNP (last 3 results) No results for input(s): "BNP" in the last 8760 hours.  ProBNP (last 3 results) No results for input(s): "PROBNP" in the last 8760 hours.  Radiological Exams: No results found.  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  He does continue to have moderate amount of thick secretions present, continue with pulmonary toilet. Seizure disorder-no signs of active seizures.  Patient scheduled to receive EEG yesterday.  Continue with medical management and seizure precautions. Aspiration pneumonia-has been treated and is showing some improvement though it is slow.  Remains high risk for reaspiration events.  Continue aspiration precautions. Traumatic brain injury status post cranioplasty-no overall changes noted. Tracheostomy-stable and remains in place.  Continue with trach care per protocol.   I have personally seen and evaluated the patient, evaluated laboratory and imaging results, formulated the assessment and plan and placed orders. The Patient requires high complexity decision making with multiple systems involvement.  Rounds were done with the Respiratory Therapy Director and Staff therapists and discussed  with nursing staff also.  Allyne Gee, MD [facility] Pulmonary Critical Care Medicine Sleep Medicine

## 2022-06-28 DIAGNOSIS — G40909 Epilepsy, unspecified, not intractable, without status epilepticus: Secondary | ICD-10-CM | POA: Diagnosis not present

## 2022-06-28 DIAGNOSIS — Z930 Tracheostomy status: Secondary | ICD-10-CM | POA: Diagnosis not present

## 2022-06-28 DIAGNOSIS — J9621 Acute and chronic respiratory failure with hypoxia: Secondary | ICD-10-CM | POA: Diagnosis not present

## 2022-06-28 DIAGNOSIS — J690 Pneumonitis due to inhalation of food and vomit: Secondary | ICD-10-CM | POA: Diagnosis not present

## 2022-06-28 LAB — VANCOMYCIN, TROUGH: Vancomycin Tr: 10 ug/mL — ABNORMAL LOW (ref 15–20)

## 2022-06-28 LAB — CULTURE, BLOOD (ROUTINE X 2)
Culture: NO GROWTH
Special Requests: ADEQUATE

## 2022-06-28 NOTE — Progress Notes (Addendum)
Pulmonary Critical Care Medicine [facility]   PULMONARY CRITICAL CARE SERVICE  PROGRESS NOTE     Joshua Haley  RDE:[id number]  DOB: September 06, 1951   DOA: 06/19/2022  Referring Physician: Luna Kitchens, MD  HPI: Joshua Haley is a 71 y.o. male being followed for ventilator/airway/oxygen weaning Acute on Chronic Respiratory Failure.  Patient seen lying in bed, currently on T-bar 40%.  Continues to have thick secretions present.  No acute distress, no acute overnight events.  Medications: Reviewed on Rounds  Physical Exam:  Vitals: Temp 97.7, pulse 88, respirations 17, BP 130/96, SPO2 100%  Ventilator Settings T-bar 40%  General: Comfortable at this time Neck: supple Cardiovascular: no malignant arrhythmias Respiratory: Right lung coarse on expiration, left lung diminished in both lobes Skin: no rash seen on limited exam Musculoskeletal: No gross abnormality Psychiatric:unable to assess Neurologic:no involuntary movements         Lab Data:   Basic Metabolic Panel: Recent Labs  Lab 06/23/22 0425 06/23/22 1644 06/25/22 0554 06/27/22 0433  NA 150* 146* 140 143  K 3.6  --  3.5 4.2  CL 108  --  102 107  CO2 29  --  30 26  GLUCOSE 274*  --  269* 276*  BUN 24*  --  27* 22  CREATININE 0.60*  --  0.56* 0.53*  CALCIUM 10.2  --  9.2 9.2  MG 1.6*  --  1.7 1.8    ABG: No results for input(s): "PHART", "PCO2ART", "PO2ART", "HCO3", "O2SAT" in the last 168 hours.  Liver Function Tests: Recent Labs  Lab 06/23/22 0425  AST 15  ALT 30  ALKPHOS 109  BILITOT 0.9  PROT 6.5  ALBUMIN 2.6*   No results for input(s): "LIPASE", "AMYLASE" in the last 168 hours. No results for input(s): "AMMONIA" in the last 168 hours.  CBC: Recent Labs  Lab 06/25/22 0554 06/27/22 0433  WBC 8.9 11.2*  HGB 13.3 12.7*  HCT 42.5 41.0  MCV 87.6 87.6  PLT 189 228    Cardiac Enzymes: No results for input(s): "CKTOTAL", "CKMB", "CKMBINDEX", "TROPONINI" in the last 168  hours.  BNP (last 3 results) No results for input(s): "BNP" in the last 8760 hours.  ProBNP (last 3 results) No results for input(s): "PROBNP" in the last 8760 hours.  Radiological Exams: No results found.  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  He does continue to have moderate amount of thick secretions present, continue with pulmonary toilet. Seizure disorder-no signs of active seizures.  Patient scheduled to receive EEG this past weekend.  Continue with medical management and seizure precautions. Aspiration pneumonia-has been treated and is showing some improvement though it is slow.  Remains high risk for reaspiration events.  Continue aspiration precautions. Traumatic brain injury status post cranioplasty-no overall changes noted. Tracheostomy-stable and remains in place.  Continue with trach care per protocol.   I have personally seen and evaluated the patient, evaluated laboratory and imaging results, formulated the assessment and plan and placed orders. The Patient requires high complexity decision making with multiple systems involvement.  Rounds were done with the Respiratory Therapy Director and Staff therapists and discussed with nursing staff also.  Lucylle Foulkes  Richardson Dopp, MD [facility] Pulmonary Critical Care Medicine Sleep Medicine

## 2022-06-29 DIAGNOSIS — S06302S Unspecified focal traumatic brain injury with loss of consciousness of 31 minutes to 59 minutes, sequela: Secondary | ICD-10-CM | POA: Diagnosis not present

## 2022-06-29 DIAGNOSIS — J9621 Acute and chronic respiratory failure with hypoxia: Secondary | ICD-10-CM | POA: Diagnosis not present

## 2022-06-29 DIAGNOSIS — J690 Pneumonitis due to inhalation of food and vomit: Secondary | ICD-10-CM | POA: Diagnosis not present

## 2022-06-29 DIAGNOSIS — G40909 Epilepsy, unspecified, not intractable, without status epilepticus: Secondary | ICD-10-CM | POA: Diagnosis not present

## 2022-06-29 LAB — CBC
HCT: 40.8 % (ref 39.0–52.0)
Hemoglobin: 12.7 g/dL — ABNORMAL LOW (ref 13.0–17.0)
MCH: 27.4 pg (ref 26.0–34.0)
MCHC: 31.1 g/dL (ref 30.0–36.0)
MCV: 87.9 fL (ref 80.0–100.0)
Platelets: 286 10*3/uL (ref 150–400)
RBC: 4.64 MIL/uL (ref 4.22–5.81)
RDW: 16 % — ABNORMAL HIGH (ref 11.5–15.5)
WBC: 9.3 10*3/uL (ref 4.0–10.5)
nRBC: 0 % (ref 0.0–0.2)

## 2022-06-29 LAB — BASIC METABOLIC PANEL
Anion gap: 9 (ref 5–15)
BUN: 18 mg/dL (ref 8–23)
CO2: 27 mmol/L (ref 22–32)
Calcium: 9.1 mg/dL (ref 8.9–10.3)
Chloride: 107 mmol/L (ref 98–111)
Creatinine, Ser: 0.46 mg/dL — ABNORMAL LOW (ref 0.61–1.24)
GFR, Estimated: >60 mL/min (ref >60)
Glucose, Bld: 262 mg/dL — ABNORMAL HIGH (ref 70–99)
Potassium: 3.9 mmol/L (ref 3.5–5.1)
Sodium: 143 mmol/L (ref 135–145)

## 2022-06-29 LAB — CULTURE, RESPIRATORY W GRAM STAIN

## 2022-06-29 LAB — HEMOGLOBIN A1C
Hgb A1c MFr Bld: 7.5 % — ABNORMAL HIGH (ref 4.8–5.6)
Mean Plasma Glucose: 168.55 mg/dL

## 2022-06-29 LAB — VANCOMYCIN, TROUGH: Vancomycin Tr: 16 ug/mL (ref 15–20)

## 2022-06-29 LAB — MAGNESIUM: Magnesium: 1.8 mg/dL (ref 1.7–2.4)

## 2022-06-29 NOTE — Progress Notes (Cosign Needed)
Pulmonary Critical Care Medicine [facility]   PULMONARY CRITICAL CARE SERVICE  PROGRESS NOTE     Joshua Haley  ATF:[id number]  DOB: 06-Jul-1951   DOA: 06/19/2022  Referring Physician: Luna Kitchens, MD  HPI: Joshua Haley is a 71 y.o. male being followed for ventilator/airway/oxygen weaning Acute on Chronic Respiratory Failure.  Patient seen lying in bed, remains on 40% T-bar.  Thick secretions remain present.  No acute distress, no acute overnight events.  Medications: Reviewed on Rounds  Physical Exam:  Vitals: Temp 97.2, pulse 97, respirations 17, BP 127/75, SPO2 97%  Ventilator Settings T-bar 40%  General: Comfortable at this time Neck: supple Cardiovascular: no malignant arrhythmias Respiratory: Bilaterally diminished Skin: no rash seen on limited exam Musculoskeletal: No gross abnormality Psychiatric:unable to assess Neurologic:no involuntary movements         Lab Data:   Basic Metabolic Panel: Recent Labs  Lab 06/23/22 0425 06/23/22 1644 06/25/22 0554 06/27/22 0433 06/29/22 0633  NA 150* 146* 140 143 143  K 3.6  --  3.5 4.2 3.9  CL 108  --  102 107 107  CO2 29  --  30 26 27   GLUCOSE 274*  --  269* 276* 262*  BUN 24*  --  27* 22 18  CREATININE 0.60*  --  0.56* 0.53* 0.46*  CALCIUM 10.2  --  9.2 9.2 9.1  MG 1.6*  --  1.7 1.8 1.8    ABG: No results for input(s): "PHART", "PCO2ART", "PO2ART", "HCO3", "O2SAT" in the last 168 hours.  Liver Function Tests: Recent Labs  Lab 06/23/22 0425  AST 15  ALT 30  ALKPHOS 109  BILITOT 0.9  PROT 6.5  ALBUMIN 2.6*   No results for input(s): "LIPASE", "AMYLASE" in the last 168 hours. No results for input(s): "AMMONIA" in the last 168 hours.  CBC: Recent Labs  Lab 06/25/22 0554 06/27/22 0433 06/29/22 0633  WBC 8.9 11.2* 9.3  HGB 13.3 12.7* 12.7*  HCT 42.5 41.0 40.8  MCV 87.6 87.6 87.9  PLT 189 228 286    Cardiac Enzymes: No results for input(s): "CKTOTAL", "CKMB",  "CKMBINDEX", "TROPONINI" in the last 168 hours.  BNP (last 3 results) No results for input(s): "BNP" in the last 8760 hours.  ProBNP (last 3 results) No results for input(s): "PROBNP" in the last 8760 hours.  Radiological Exams: No results found.  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  He does continue to have moderate amount of thick secretions present, continue with pulmonary toilet. Seizure disorder-no signs of active seizures.  Patient received EEG this past weekend.  Continue with medical management and seizure precautions. Aspiration pneumonia-has been treated and is showing some improvement though it is slow.  Remains high risk for reaspiration events.  Continue aspiration precautions. Traumatic brain injury status post cranioplasty-no overall changes noted. Tracheostomy-stable and remains in place.  Continue with trach care per protocol.   I have personally seen and evaluated the patient, evaluated laboratory and imaging results, formulated the assessment and plan and placed orders. The Patient requires high complexity decision making with multiple systems involvement.  Rounds were done with the Respiratory Therapy Director and Staff therapists and discussed with nursing  staff also.  Allyne Gee, MD [facility] [place] Pulmonary Critical Care Medicine Sleep Medicine

## 2022-06-30 DIAGNOSIS — J690 Pneumonitis due to inhalation of food and vomit: Secondary | ICD-10-CM | POA: Diagnosis not present

## 2022-06-30 DIAGNOSIS — J9621 Acute and chronic respiratory failure with hypoxia: Secondary | ICD-10-CM | POA: Diagnosis not present

## 2022-06-30 DIAGNOSIS — G40909 Epilepsy, unspecified, not intractable, without status epilepticus: Secondary | ICD-10-CM | POA: Diagnosis not present

## 2022-06-30 DIAGNOSIS — S06302S Unspecified focal traumatic brain injury with loss of consciousness of 31 minutes to 59 minutes, sequela: Secondary | ICD-10-CM | POA: Diagnosis not present

## 2022-06-30 NOTE — Progress Notes (Cosign Needed)
Pulmonary Critical Care Medicine [facility]   PULMONARY CRITICAL CARE SERVICE  PROGRESS NOTE     Joshua Haley  [id number]  DOB: 12/30/1950   DOA: 06/19/2022  Referring Physician: Luna Kitchens, MD  HPI: Joshua Haley is a 71 y.o. male being followed for ventilator/airway/oxygen weaning Acute on Chronic Respiratory Failure.  Patient seen lying in bed, currently remains on T-bar 40%.  Thick secretions remain present.  No acute distress, no acute overnight events.  Medications: Reviewed on Rounds  Physical Exam:  Vitals: Temp 98.1, pulse 98, respirations 15, BP 122/68, SPO2 98%  Ventilator Settings T-bar 40%  General: Comfortable at this time Neck: supple Cardiovascular: no malignant arrhythmias Respiratory: Bilaterally diminished Skin: no rash seen on limited exam Musculoskeletal: No gross abnormality Psychiatric:unable to assess Neurologic:no involuntary movements         Lab Data:   Basic Metabolic Panel: Recent Labs  Lab 06/25/22 0554 06/27/22 0433 06/29/22 0633  NA 140 143 143  K 3.5 4.2 3.9  CL 102 107 107  CO2 30 26 27   GLUCOSE 269* 276* 262*  BUN 27* 22 18  CREATININE 0.56* 0.53* 0.46*  CALCIUM 9.2 9.2 9.1  MG 1.7 1.8 1.8    ABG: No results for input(s): "PHART", "PCO2ART", "PO2ART", "HCO3", "O2SAT" in the last 168 hours.  Liver Function Tests: No results for input(s): "AST", "ALT", "ALKPHOS", "BILITOT", "PROT", "ALBUMIN" in the last 168 hours. No results for input(s): "LIPASE", "AMYLASE" in the last 168 hours. No results for input(s): "AMMONIA" in the last 168 hours.  CBC: Recent Labs  Lab 06/25/22 0554 06/27/22 0433 06/29/22 0633  WBC 8.9 11.2* 9.3  HGB 13.3 12.7* 12.7*  HCT 42.5 41.0 40.8  MCV 87.6 87.6 87.9  PLT 189 228 286    Cardiac Enzymes: No results for input(s): "CKTOTAL", "CKMB", "CKMBINDEX", "TROPONINI" in the last 168 hours.  BNP (last 3 results) No results for input(s): "BNP" in the last 8760  hours.  ProBNP (last 3 results) No results for input(s): "PROBNP" in the last 8760 hours.  Radiological Exams: No results found.  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  He does continue to have moderate amount of thick secretions present, continue with pulmonary toilet. Seizure disorder-no signs of active seizures.  Patient received EEG this past weekend.  Continue with medical management and seizure precautions. Aspiration pneumonia-has been treated and is showing some improvement though it is slow.  Remains high risk for reaspiration events.  Continue aspiration precautions. Traumatic brain injury status post cranioplasty-no overall changes noted. Tracheostomy-stable and remains in place.  Continue with trach care per protocol.   I have personally seen and evaluated the patient, evaluated laboratory and imaging results, formulated the assessment and plan and placed orders. The Patient requires high complexity decision making with multiple systems involvement.  Rounds were done with the Respiratory Therapy Director and Staff therapists and discussed with nursing staff also.  08/30/22, MD [facility] Pulmonary Critical Care Medicine Sleep Medicine

## 2022-07-01 ENCOUNTER — Other Ambulatory Visit (HOSPITAL_COMMUNITY): Payer: No Typology Code available for payment source

## 2022-07-01 DIAGNOSIS — S06302S Unspecified focal traumatic brain injury with loss of consciousness of 31 minutes to 59 minutes, sequela: Secondary | ICD-10-CM | POA: Diagnosis not present

## 2022-07-01 DIAGNOSIS — G40909 Epilepsy, unspecified, not intractable, without status epilepticus: Secondary | ICD-10-CM | POA: Diagnosis not present

## 2022-07-01 DIAGNOSIS — J9621 Acute and chronic respiratory failure with hypoxia: Secondary | ICD-10-CM | POA: Diagnosis not present

## 2022-07-01 DIAGNOSIS — J690 Pneumonitis due to inhalation of food and vomit: Secondary | ICD-10-CM | POA: Diagnosis not present

## 2022-07-01 LAB — VANCOMYCIN, TROUGH: Vancomycin Tr: 20 ug/mL (ref 15–20)

## 2022-07-01 NOTE — Progress Notes (Signed)
  Echocardiogram 2D Echocardiogram has been performed.  [name] 07/01/2022, 3:31 PM

## 2022-07-01 NOTE — Progress Notes (Addendum)
Pulmonary Critical Care Medicine [facility]   PULMONARY CRITICAL CARE SERVICE  PROGRESS NOTE     Joshua Haley  [id number]  DOB: [birth year]   DOA: 06/19/2022  Referring Physician: Luna Kitchens, MD  HPI: Joshua Haley is a 71 y.o. male being followed for ventilator/airway/oxygen weaning Acute on Chronic Respiratory Failure.  Patient seen lying in bed, currently remains on T-bar 40%.  His secretions are decreasing.  No acute distress, no acute overnight events.  Medications: Reviewed on Rounds  Physical Exam:  Vitals: Temp 98.1, pulse 95, respirations 16, BP 125/50, SPO2 97%  Ventilator Settings T-bar 40%  General: Comfortable at this time Neck: supple Cardiovascular: no malignant arrhythmias Respiratory: Bilaterally diminished Skin: no rash seen on limited exam Musculoskeletal: No gross abnormality Psychiatric:unable to assess Neurologic:no involuntary movements         Lab Data:   Basic Metabolic Panel: Recent Labs  Lab 06/25/22 0554 06/27/22 0433 06/29/22 0633  NA 140 143 143  K 3.5 4.2 3.9  CL 102 107 107  CO2 30 26 27   GLUCOSE 269* 276* 262*  BUN 27* 22 18  CREATININE 0.56* 0.53* 0.46*  CALCIUM 9.2 9.2 9.1  MG 1.7 1.8 1.8    ABG: No results for input(s): "PHART", "PCO2ART", "PO2ART", "HCO3", "O2SAT" in the last 168 hours.  Liver Function Tests: No results for input(s): "AST", "ALT", "ALKPHOS", "BILITOT", "PROT", "ALBUMIN" in the last 168 hours. No results for input(s): "LIPASE", "AMYLASE" in the last 168 hours. No results for input(s): "AMMONIA" in the last 168 hours.  CBC: Recent Labs  Lab 06/25/22 0554 06/27/22 0433 06/29/22 0633  WBC 8.9 11.2* 9.3  HGB 13.3 12.7* 12.7*  HCT 42.5 41.0 40.8  MCV 87.6 87.6 87.9  PLT 189 228 286    Cardiac Enzymes: No results for input(s): "CKTOTAL", "CKMB", "CKMBINDEX", "TROPONINI" in the last 168 hours.  BNP (last 3 results) No results for input(s): "BNP" in the last 8760  hours.  ProBNP (last 3 results) No results for input(s): "PROBNP" in the last 8760 hours.  Radiological Exams: No results found.  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  He does continue to have a small amount of secretions that remain, they are decreasing. Continue with pulmonary toilet. Seizure disorder-no signs of active seizures.  Patient received EEG this past weekend.  Continue with medical management and seizure precautions. Aspiration pneumonia-has been treated and is showing some improvement though it is slow.  Remains high risk for reaspiration events.  Continue aspiration precautions. Traumatic brain injury status post cranioplasty-no overall changes noted. Tracheostomy-stable and remains in place.  Continue with trach care per protocol.   I have personally seen and evaluated the patient, evaluated laboratory and imaging results, formulated the assessment and plan and placed orders. The Patient requires high complexity decision making with multiple systems involvement.  Rounds were done with the Respiratory Therapy Director and Staff therapists and discussed with nursing staff also.  08/30/22, MD [facility] Pulmonary Critical Care Medicine Sleep Medicine

## 2022-07-02 DIAGNOSIS — Z930 Tracheostomy status: Secondary | ICD-10-CM | POA: Diagnosis not present

## 2022-07-02 DIAGNOSIS — J690 Pneumonitis due to inhalation of food and vomit: Secondary | ICD-10-CM | POA: Diagnosis not present

## 2022-07-02 DIAGNOSIS — J9621 Acute and chronic respiratory failure with hypoxia: Secondary | ICD-10-CM | POA: Diagnosis not present

## 2022-07-02 DIAGNOSIS — G40909 Epilepsy, unspecified, not intractable, without status epilepticus: Secondary | ICD-10-CM | POA: Diagnosis not present

## 2022-07-02 LAB — BASIC METABOLIC PANEL
Anion gap: 11 (ref 5–15)
BUN: 19 mg/dL (ref 8–23)
CO2: 26 mmol/L (ref 22–32)
Calcium: 9.4 mg/dL (ref 8.9–10.3)
Chloride: 107 mmol/L (ref 98–111)
Creatinine, Ser: 0.48 mg/dL — ABNORMAL LOW (ref 0.61–1.24)
GFR, Estimated: >60 mL/min (ref >60)
Glucose, Bld: 141 mg/dL — ABNORMAL HIGH (ref 70–99)
Potassium: 4.2 mmol/L (ref 3.5–5.1)
Sodium: 144 mmol/L (ref 135–145)

## 2022-07-02 LAB — CBC
HCT: 39.5 % (ref 39.0–52.0)
Hemoglobin: 12.2 g/dL — ABNORMAL LOW (ref 13.0–17.0)
MCH: 27.3 pg (ref 26.0–34.0)
MCHC: 30.9 g/dL (ref 30.0–36.0)
MCV: 88.4 fL (ref 80.0–100.0)
Platelets: 356 10*3/uL (ref 150–400)
RBC: 4.47 MIL/uL (ref 4.22–5.81)
RDW: 16.7 % — ABNORMAL HIGH (ref 11.5–15.5)
WBC: 10.9 10*3/uL — ABNORMAL HIGH (ref 4.0–10.5)
nRBC: 0 % (ref 0.0–0.2)

## 2022-07-02 LAB — MAGNESIUM: Magnesium: 1.7 mg/dL (ref 1.7–2.4)

## 2022-07-02 NOTE — Progress Notes (Signed)
Pulmonary Critical Care Medicine [facility]   PULMONARY CRITICAL CARE SERVICE  PROGRESS NOTE     Joshua Haley  [id number]  DOB: 1951-03-26   DOA: 06/19/2022  Referring Physician: Luna Kitchens, MD  HPI: Joshua Haley is a 71 y.o. male being followed for ventilator/airway/oxygen weaning Acute on Chronic Respiratory Failure.  Patient is on 35% FiO2 on the T-bar saturations are good secretions are minimal  Medications: Reviewed on Rounds  Physical Exam:  Vitals: Temperature is 98 pulse 97 respiratory rate 20 blood pressure 136/79  Ventilator Settings off the ventilator on T collar FiO2 35%  General: Comfortable at this time Neck: supple Cardiovascular: no malignant arrhythmias Respiratory: Scattered coarse rhonchi Skin: no rash seen on limited exam Musculoskeletal: No gross abnormality Psychiatric:unable to assess Neurologic:no involuntary movements         Lab Data:   Basic Metabolic Panel: Recent Labs  Lab 06/27/22 0433 06/29/22 0633 07/02/22 0404  NA 143 143 144  K 4.2 3.9 4.2  CL 107 107 107  CO2 26 27 26   GLUCOSE 276* 262* 141*  BUN 22 18 19   CREATININE 0.53* 0.46* 0.48*  CALCIUM 9.2 9.1 9.4  MG 1.8 1.8 1.7    ABG: No results for input(s): "PHART", "PCO2ART", "PO2ART", "HCO3", "O2SAT" in the last 168 hours.  Liver Function Tests: No results for input(s): "AST", "ALT", "ALKPHOS", "BILITOT", "PROT", "ALBUMIN" in the last 168 hours. No results for input(s): "LIPASE", "AMYLASE" in the last 168 hours. No results for input(s): "AMMONIA" in the last 168 hours.  CBC: Recent Labs  Lab 06/27/22 0433 06/29/22 0633 07/02/22 0404  WBC 11.2* 9.3 10.9*  HGB 12.7* 12.7* 12.2*  HCT 41.0 40.8 39.5  MCV 87.6 87.9 88.4  PLT 228 286 356    Cardiac Enzymes: No results for input(s): "CKTOTAL", "CKMB", "CKMBINDEX", "TROPONINI" in the last 168 hours.  BNP (last 3 results) No results for input(s): "BNP" in the last 8760  hours.  ProBNP (last 3 results) No results for input(s): "PROBNP" in the last 8760 hours.  Radiological Exams: No results found.  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 The Patient requires high complexity decision making with multiple systems involvement.  Rounds were done with the Respiratory Therapy Director and Staff therapists and discussed with nursing staff also.  08/30/22, MD [facility] Pulmonary Critical Care Medicine Sleep Medicine

## 2022-07-03 DIAGNOSIS — J690 Pneumonitis due to inhalation of food and vomit: Secondary | ICD-10-CM | POA: Diagnosis not present

## 2022-07-03 DIAGNOSIS — Z930 Tracheostomy status: Secondary | ICD-10-CM | POA: Diagnosis not present

## 2022-07-03 DIAGNOSIS — J9621 Acute and chronic respiratory failure with hypoxia: Secondary | ICD-10-CM | POA: Diagnosis not present

## 2022-07-03 DIAGNOSIS — G40909 Epilepsy, unspecified, not intractable, without status epilepticus: Secondary | ICD-10-CM | POA: Diagnosis not present

## 2022-07-03 LAB — VANCOMYCIN, TROUGH: Vancomycin Tr: 16 ug/mL (ref 15–20)

## 2022-07-03 LAB — MAGNESIUM: Magnesium: 1.7 mg/dL (ref 1.7–2.4)

## 2022-07-03 NOTE — Progress Notes (Cosign Needed)
Pulmonary Critical Care Medicine [facility]   PULMONARY CRITICAL CARE SERVICE  PROGRESS NOTE     Joshua Haley  [id number]  DOB: [birth year]   DOA: 06/19/2022  Referring Physician: Luna Kitchens, MD  HPI: Joshua Haley is a 71 y.o. male being followed for ventilator/airway/oxygen weaning Acute on Chronic Respiratory Failure.  Patient seen lying in bed, currently remains on T-bar 35%.  We are not attempting the PMV at this time just yet.  No acute distress, no acute overnight events.  Medications: Reviewed on Rounds  Physical Exam:  Vitals: Temp 97.7, pulse 73, respirations 15, BP 120/75, SPO2 98%  Ventilator Settings T-bar 35%  General: Comfortable at this time Neck: supple Cardiovascular: no malignant arrhythmias Respiratory: Bilaterally diminished Skin: no rash seen on limited exam Musculoskeletal: No gross abnormality Psychiatric:unable to assess Neurologic:no involuntary movements         Lab Data:   Basic Metabolic Panel: Recent Labs  Lab 06/27/22 0433 06/29/22 0633 07/02/22 0404 07/03/22 1811  NA 143 143 144  --   K 4.2 3.9 4.2  --   CL 107 107 107  --   CO2 26 27 26   --   GLUCOSE 276* 262* 141*  --   BUN 22 18 19   --   CREATININE 0.53* 0.46* 0.48*  --   CALCIUM 9.2 9.1 9.4  --   MG 1.8 1.8 1.7 1.7    ABG: No results for input(s): "PHART", "PCO2ART", "PO2ART", "HCO3", "O2SAT" in the last 168 hours.  Liver Function Tests: No results for input(s): "AST", "ALT", "ALKPHOS", "BILITOT", "PROT", "ALBUMIN" in the last 168 hours. No results for input(s): "LIPASE", "AMYLASE" in the last 168 hours. No results for input(s): "AMMONIA" in the last 168 hours.  CBC: Recent Labs  Lab 06/27/22 0433 06/29/22 0633 07/02/22 0404  WBC 11.2* 9.3 10.9*  HGB 12.7* 12.7* 12.2*  HCT 41.0 40.8 39.5  MCV 87.6 87.9 88.4  PLT 228 286 356    Cardiac Enzymes: No results for input(s): "CKTOTAL", "CKMB", "CKMBINDEX", "TROPONINI" in the last 168  hours.  BNP (last 3 results) No results for input(s): "BNP" in the last 8760 hours.  ProBNP (last 3 results) No results for input(s): "PROBNP" in the last 8760 hours.  Radiological Exams: No results found.  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 He does continue to have a small amount of secretions that remain, they are decreasing.  We are not attempting PMV just yet.  Continue with pulmonary toilet. Seizure disorder-no signs of active seizures.  Patient received EEG this past weekend.  Continue with medical management and seizure precautions. Aspiration pneumonia-has been treated and is showing some improvement though it is slow.  Remains high risk for reaspiration events.  Continue aspiration precautions. Traumatic brain injury status post cranioplasty-no overall changes noted. Tracheostomy-stable and remains in place.  Continue with trach care per protocol.   I have personally seen and evaluated the patient, evaluated laboratory and imaging results, formulated the assessment and plan and placed orders. The Patient requires high complexity decision making with multiple systems involvement.  Rounds were done with the Respiratory Therapy Director and Staff therapists and discussed with nursing staff  also.  Allyne Gee, MD [facility] Pulmonary Critical Care Medicine Sleep Medicine

## 2022-07-04 DIAGNOSIS — J690 Pneumonitis due to inhalation of food and vomit: Secondary | ICD-10-CM | POA: Diagnosis not present

## 2022-07-04 DIAGNOSIS — Z930 Tracheostomy status: Secondary | ICD-10-CM | POA: Diagnosis not present

## 2022-07-04 DIAGNOSIS — G40909 Epilepsy, unspecified, not intractable, without status epilepticus: Secondary | ICD-10-CM | POA: Diagnosis not present

## 2022-07-04 DIAGNOSIS — J9621 Acute and chronic respiratory failure with hypoxia: Secondary | ICD-10-CM | POA: Diagnosis not present

## 2022-07-04 LAB — ECHOCARDIOGRAM COMPLETE
Area-P 1/2: 3.26 cm2
Calc EF: 60.1 %
S' Lateral: 3.2 cm
Single Plane A2C EF: 56.4 %
Single Plane A4C EF: 62.8 %

## 2022-07-05 DIAGNOSIS — G40909 Epilepsy, unspecified, not intractable, without status epilepticus: Secondary | ICD-10-CM | POA: Diagnosis not present

## 2022-07-05 DIAGNOSIS — J9621 Acute and chronic respiratory failure with hypoxia: Secondary | ICD-10-CM | POA: Diagnosis not present

## 2022-07-05 DIAGNOSIS — J690 Pneumonitis due to inhalation of food and vomit: Secondary | ICD-10-CM | POA: Diagnosis not present

## 2022-07-05 DIAGNOSIS — Z930 Tracheostomy status: Secondary | ICD-10-CM | POA: Diagnosis not present

## 2022-07-05 NOTE — Progress Notes (Cosign Needed)
Pulmonary Critical Care Medicine [facility]   PULMONARY CRITICAL CARE SERVICE  PROGRESS NOTE     Joshua Haley  [id number]  DOB: [birth year]   DOA: 06/19/2022  Referring Physician: Luna Kitchens, MD  HPI: Joshua Haley is a 71 y.o. male being followed for ventilator/airway/oxygen weaning Acute on Chronic Respiratory Failure.  Patient seen lying in bed, currently remains on T-bar 35%.  He continues to have large volume of thick secretions.  No acute distress, no acute overnight events.  Medications: Reviewed on Rounds  Physical Exam:  Vitals: Temp 96.3, pulse 73, respirations 43, BP 133/70, SPO2 98%  Ventilator Settings T-bar 35%  General: Comfortable at this time Neck: supple Cardiovascular: no malignant arrhythmias Respiratory: Bilaterally diminished on inspiration, bilaterally coarse on expiration Skin: no rash seen on limited exam Musculoskeletal: No gross abnormality Psychiatric:unable to assess Neurologic:no involuntary movements         Lab Data:   Basic Metabolic Panel: Recent Labs  Lab 06/29/22 0633 07/02/22 0404 07/03/22 1811  NA 143 144  --   K 3.9 4.2  --   CL 107 107  --   CO2 27 26  --   GLUCOSE 262* 141*  --   BUN 18 19  --   CREATININE 0.46* 0.48*  --   CALCIUM 9.1 9.4  --   MG 1.8 1.7 1.7    ABG: No results for input(s): "PHART", "PCO2ART", "PO2ART", "HCO3", "O2SAT" in the last 168 hours.  Liver Function Tests: No results for input(s): "AST", "ALT", "ALKPHOS", "BILITOT", "PROT", "ALBUMIN" in the last 168 hours. No results for input(s): "LIPASE", "AMYLASE" in the last 168 hours. No results for input(s): "AMMONIA" in the last 168 hours.  CBC: Recent Labs  Lab 06/29/22 0633 07/02/22 0404  WBC 9.3 10.9*  HGB 12.7* 12.2*  HCT 40.8 39.5  MCV 87.9 88.4  PLT 286 356    Cardiac Enzymes: No results for input(s): "CKTOTAL", "CKMB", "CKMBINDEX", "TROPONINI" in the last 168 hours.  BNP (last 3 results) No  results for input(s): "BNP" in the last 8760 hours.  ProBNP (last 3 results) No results for input(s): "PROBNP" in the last 8760 hours.  Radiological Exams: No results found.  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 He does continue to have a large amount of secretions that remain. We are not attempting PMV just yet.  Continue with pulmonary toilet. Seizure disorder-no signs of active seizures.  Patient received EEG last week.  Continue with medical management and seizure precautions. Aspiration pneumonia-has been treated and is showing some improvement though it is slow.  Remains high risk for reaspiration events.  Continue aspiration precautions. Traumatic brain injury status post cranioplasty-no overall changes noted. Tracheostomy-stable and remains in place.  Continue with trach care per protocol.  I have personally seen and evaluated the patient, evaluated laboratory and imaging results, formulated the assessment and plan and placed orders. The Patient requires high complexity decision making with multiple systems involvement.  Rounds were done with the Respiratory Therapy Director and Staff therapists and discussed with nursing staff also.  Yevonne Pax, MD [facility] Pulmonary Critical Care Medicine Sleep Medicine

## 2022-07-05 NOTE — Progress Notes (Cosign Needed)
Pulmonary Critical Care Medicine [facility]   PULMONARY CRITICAL CARE SERVICE  PROGRESS NOTE     Joshua Haley  [name]  DOB: April 16, 1951   DOA: 06/19/2022  Referring Physician: Luna Kitchens, MD  HPI: Joshua Haley is a 71 y.o. male being followed for ventilator/airway/oxygen weaning Acute on Chronic Respiratory Failure.  Patient seen lying in bed, currently remains on T-bar 35%.  Moderate thick secretions remain present.  No acute distress, no acute overnight events.  Medications: Reviewed on Rounds  Physical Exam:  Vitals: Temp 97.6, pulse 75, respirations 33, BP 127/64, SPO2 97%  Ventilator Settings T-bar 35%  General: Comfortable at this time Neck: supple Cardiovascular: no malignant arrhythmias Respiratory: Bilaterally diminished on inspiration, bilaterally coarse on expiration Skin: no rash seen on limited exam Musculoskeletal: No gross abnormality Psychiatric:unable to assess Neurologic:no involuntary movements         Lab Data:   Basic Metabolic Panel: Recent Labs  Lab 06/29/22 0633 07/02/22 0404 07/03/22 1811  NA 143 144  --   K 3.9 4.2  --   CL 107 107  --   CO2 27 26  --   GLUCOSE 262* 141*  --   BUN 18 19  --   CREATININE 0.46* 0.48*  --   CALCIUM 9.1 9.4  --   MG 1.8 1.7 1.7    ABG: No results for input(s): "PHART", "PCO2ART", "PO2ART", "HCO3", "O2SAT" in the last 168 hours.  Liver Function Tests: No results for input(s): "AST", "ALT", "ALKPHOS", "BILITOT", "PROT", "ALBUMIN" in the last 168 hours. No results for input(s): "LIPASE", "AMYLASE" in the last 168 hours. No results for input(s): "AMMONIA" in the last 168 hours.  CBC: Recent Labs  Lab 06/29/22 0633 07/02/22 0404  WBC 9.3 10.9*  HGB 12.7* 12.2*  HCT 40.8 39.5  MCV 87.9 88.4  PLT 286 356    Cardiac Enzymes: No results for input(s): "CKTOTAL", "CKMB", "CKMBINDEX", "TROPONINI" in the last 168 hours.  BNP (last 3 results) No results for  input(s): "BNP" in the last 8760 hours.  ProBNP (last 3 results) No results for input(s): "PROBNP" in the last 8760 hours.  Radiological Exams: No results found.  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 He does continue to have a small amount of secretions that remain, they are decreasing.  We are not attempting PMV just yet.  Continue with pulmonary toilet. Seizure disorder-no signs of active seizures.  Patient received EEG last week.  Continue with medical management and seizure precautions. Aspiration pneumonia-has been treated and is showing some improvement though it is slow.  Remains high risk for reaspiration events.  Continue aspiration precautions. Traumatic brain injury status post cranioplasty-no overall changes noted. Tracheostomy-stable and remains in place.  Continue with trach care per protocol.   I have personally seen and evaluated the patient, evaluated laboratory and imaging results, formulated the assessment and plan and placed orders. The Patient requires high complexity decision making with multiple systems involvement.  Rounds were done with the Respiratory Therapy Director and Staff therapists and discussed with nursing staff also.  Yevonne Pax, MD [facility] Pulmonary Critical Care Medicine Sleep Medicine

## 2022-07-06 DIAGNOSIS — J690 Pneumonitis due to inhalation of food and vomit: Secondary | ICD-10-CM | POA: Diagnosis not present

## 2022-07-06 DIAGNOSIS — Z930 Tracheostomy status: Secondary | ICD-10-CM | POA: Diagnosis not present

## 2022-07-06 DIAGNOSIS — J9621 Acute and chronic respiratory failure with hypoxia: Secondary | ICD-10-CM | POA: Diagnosis not present

## 2022-07-06 DIAGNOSIS — G40909 Epilepsy, unspecified, not intractable, without status epilepticus: Secondary | ICD-10-CM | POA: Diagnosis not present

## 2022-07-06 LAB — MAGNESIUM: Magnesium: 1.7 mg/dL (ref 1.7–2.4)

## 2022-07-06 NOTE — Progress Notes (Signed)
Pulmonary Critical Care Medicine [facility]   PULMONARY CRITICAL CARE SERVICE  PROGRESS NOTE     Joshua Haley  [id number]  DOB: April 09, 1951   DOA: 06/19/2022  Referring Physician: Luna Kitchens, MD  HPI: Joshua Haley is a 71 y.o. male being followed for ventilator/airway/oxygen weaning Acute on Chronic Respiratory Failure.  Patient is resting without distress on T collar has been on 35% FiO2 with good saturations.  Medications: Reviewed on Rounds  Physical Exam:  Vitals: Temperature is 96.3 pulse of 73 respiratory rate is 30 blood pressure 126/71 saturations 95%  Ventilator Settings on T collar FiO2 of 35%  General: Comfortable at this time Neck: supple Cardiovascular: no malignant arrhythmias Respiratory: Coarse rhonchi expansion is equal Skin: no rash seen on limited exam Musculoskeletal: No gross abnormality Psychiatric:unable to assess Neurologic:no involuntary movements         Lab Data:   Basic Metabolic Panel: Recent Labs  Lab 07/02/22 0404 07/03/22 1811 07/06/22 0907  NA 144  --   --   K 4.2  --   --   CL 107  --   --   CO2 26  --   --   GLUCOSE 141*  --   --   BUN 19  --   --   CREATININE 0.48*  --   --   CALCIUM 9.4  --   --   MG 1.7 1.7 1.7    ABG: No results for input(s): "PHART", "PCO2ART", "PO2ART", "HCO3", "O2SAT" in the last 168 hours.  Liver Function Tests: No results for input(s): "AST", "ALT", "ALKPHOS", "BILITOT", "PROT", "ALBUMIN" in the last 168 hours. No results for input(s): "LIPASE", "AMYLASE" in the last 168 hours. No results for input(s): "AMMONIA" in the last 168 hours.  CBC: Recent Labs  Lab 07/02/22 0404  WBC 10.9*  HGB 12.2*  HCT 39.5  MCV 88.4  PLT 356    Cardiac Enzymes: No results for input(s): "CKTOTAL", "CKMB", "CKMBINDEX", "TROPONINI" in the last 168 hours.  BNP (last 3 results) No results for input(s): "BNP" in the last 8760 hours.  ProBNP (last 3 results) No results for  input(s): "PROBNP" in the last 8760 hours.  Radiological Exams: No results found.  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 The Patient requires high complexity decision making with multiple systems involvement.  Rounds were done with the Respiratory Therapy Director and Staff therapists and discussed with nursing staff also.  Yevonne Pax, MD [facility] Pulmonary Critical Care Medicine Sleep Medicine

## 2022-07-07 DIAGNOSIS — S06302S Unspecified focal traumatic brain injury with loss of consciousness of 31 minutes to 59 minutes, sequela: Secondary | ICD-10-CM | POA: Diagnosis not present

## 2022-07-07 DIAGNOSIS — J9621 Acute and chronic respiratory failure with hypoxia: Secondary | ICD-10-CM | POA: Diagnosis not present

## 2022-07-07 DIAGNOSIS — J690 Pneumonitis due to inhalation of food and vomit: Secondary | ICD-10-CM | POA: Diagnosis not present

## 2022-07-07 DIAGNOSIS — G40909 Epilepsy, unspecified, not intractable, without status epilepticus: Secondary | ICD-10-CM | POA: Diagnosis not present

## 2022-07-07 LAB — MAGNESIUM: Magnesium: 1.7 mg/dL (ref 1.7–2.4)

## 2022-07-07 NOTE — Progress Notes (Signed)
Pulmonary Critical Care Medicine [facility]   PULMONARY CRITICAL CARE SERVICE  PROGRESS NOTE     Joshua Haley  LFY:[id number]  DOB: [birth year]   DOA: 06/19/2022  Referring Physician: Luna Kitchens, MD  HPI: Joshua Haley is a 71 y.o. male being followed for ventilator/airway/oxygen weaning Acute on Chronic Respiratory Failure.  Patient is off the ventilator on T collar on 28% FiO2 no fevers are noted  Medications: Reviewed on Rounds  Physical Exam:  Vitals: Temperature 96.7 pulse 85 respiratory rate 29 blood pressure 149/81 saturations 98%  Ventilator Settings ventilator on T collar  General: Comfortable at this time Neck: supple Cardiovascular: no malignant arrhythmias Respiratory: Coarse rhonchi expansion equal Skin: no rash seen on limited exam Musculoskeletal: No gross abnormality Psychiatric:unable to assess Neurologic:no involuntary movements         Lab Data:   Basic Metabolic Panel: Recent Labs  Lab 07/02/22 0404 07/03/22 1811 07/06/22 0907 07/07/22 0829  NA 144  --   --   --   K 4.2  --   --   --   CL 107  --   --   --   CO2 26  --   --   --   GLUCOSE 141*  --   --   --   BUN 19  --   --   --   CREATININE 0.48*  --   --   --   CALCIUM 9.4  --   --   --   MG 1.7 1.7 1.7 1.7    ABG: No results for input(s): "PHART", "PCO2ART", "PO2ART", "HCO3", "O2SAT" in the last 168 hours.  Liver Function Tests: No results for input(s): "AST", "ALT", "ALKPHOS", "BILITOT", "PROT", "ALBUMIN" in the last 168 hours. No results for input(s): "LIPASE", "AMYLASE" in the last 168 hours. No results for input(s): "AMMONIA" in the last 168 hours.  CBC: Recent Labs  Lab 07/02/22 0404  WBC 10.9*  HGB 12.2*  HCT 39.5  MCV 88.4  PLT 356    Cardiac Enzymes: No results for input(s): "CKTOTAL", "CKMB", "CKMBINDEX", "TROPONINI" in the last 168 hours.  BNP (last 3 results) No results for input(s): "BNP" in the last 8760 hours.  ProBNP (last 3  results) No results for input(s): "PROBNP" in the last 8760 hours.  Radiological Exams: No results found.  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 The Patient requires high complexity decision making with multiple systems involvement.  Rounds were done with the Respiratory Therapy Director and Staff therapists and discussed with nursing staff also.  Yevonne Pax, MD [facility] Pulmonary Critical Care Medicine Sleep Medicine

## 2022-07-08 DIAGNOSIS — Z930 Tracheostomy status: Secondary | ICD-10-CM | POA: Diagnosis not present

## 2022-07-08 DIAGNOSIS — J9621 Acute and chronic respiratory failure with hypoxia: Secondary | ICD-10-CM | POA: Diagnosis not present

## 2022-07-08 DIAGNOSIS — G40909 Epilepsy, unspecified, not intractable, without status epilepticus: Secondary | ICD-10-CM | POA: Diagnosis not present

## 2022-07-08 DIAGNOSIS — J690 Pneumonitis due to inhalation of food and vomit: Secondary | ICD-10-CM | POA: Diagnosis not present

## 2022-07-08 LAB — MAGNESIUM: Magnesium: 1.8 mg/dL (ref 1.7–2.4)

## 2022-07-08 NOTE — Progress Notes (Signed)
Pulmonary Critical Care Medicine [facility]   PULMONARY CRITICAL CARE SERVICE  PROGRESS NOTE     Joshua Haley  TKW:[id number]  DOB: November 01, 1951   DOA: 06/19/2022  Referring Physician: Luna Kitchens, MD  HPI: Joshua Haley is a 71 y.o. male being followed for ventilator/airway/oxygen weaning Acute on Chronic Respiratory Failure.  Patient remains on T collar is afebrile appears to be comfortable  Medications: Reviewed on Rounds  Physical Exam:  Vitals: Temperature is 97.7 pulse 83 respiratory 23 blood pressure 142/87 saturations 99%  Ventilator Settings off the ventilator on T collar  General: Comfortable at this time Neck: supple Cardiovascular: no malignant arrhythmias Respiratory: Scattered rhonchi expansion is equal Skin: no rash seen on limited exam Musculoskeletal: No gross abnormality Psychiatric:unable to assess Neurologic:no involuntary movements         Lab Data:   Basic Metabolic Panel: Recent Labs  Lab 07/02/22 0404 07/03/22 1811 07/06/22 0907 07/07/22 0829 07/08/22 0503  NA 144  --   --   --   --   K 4.2  --   --   --   --   CL 107  --   --   --   --   CO2 26  --   --   --   --   GLUCOSE 141*  --   --   --   --   BUN 19  --   --   --   --   CREATININE 0.48*  --   --   --   --   CALCIUM 9.4  --   --   --   --   MG 1.7 1.7 1.7 1.7 1.8    ABG: No results for input(s): "PHART", "PCO2ART", "PO2ART", "HCO3", "O2SAT" in the last 168 hours.  Liver Function Tests: No results for input(s): "AST", "ALT", "ALKPHOS", "BILITOT", "PROT", "ALBUMIN" in the last 168 hours. No results for input(s): "LIPASE", "AMYLASE" in the last 168 hours. No results for input(s): "AMMONIA" in the last 168 hours.  CBC: Recent Labs  Lab 07/02/22 0404  WBC 10.9*  HGB 12.2*  HCT 39.5  MCV 88.4  PLT 356    Cardiac Enzymes: No results for input(s): "CKTOTAL", "CKMB", "CKMBINDEX", "TROPONINI" in the last 168 hours.  BNP (last 3 results) No  results for input(s): "BNP" in the last 8760 hours.  ProBNP (last 3 results) No results for input(s): "PROBNP" in the last 8760 hours.  Radiological Exams: No results found.  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 Seizure disorder no sign of active seizures at this time Aspiration pneumonia has been treated clinically improving Focal traumatic brain injury no change supportive care Tracheostomy will need to remain in place   I have personally seen and evaluated the patient, evaluated laboratory and imaging results, formulated the assessment and plan and placed orders. The Patient requires high complexity decision making with multiple systems involvement.  Rounds were done with the Respiratory Therapy Director and Staff therapists and discussed with nursing staff also.  Yevonne Pax, MD [facility] Pulmonary Critical Care Medicine Sleep Medicine

## 2022-07-09 DIAGNOSIS — Z930 Tracheostomy status: Secondary | ICD-10-CM | POA: Diagnosis not present

## 2022-07-09 DIAGNOSIS — J690 Pneumonitis due to inhalation of food and vomit: Secondary | ICD-10-CM | POA: Diagnosis not present

## 2022-07-09 DIAGNOSIS — J9621 Acute and chronic respiratory failure with hypoxia: Secondary | ICD-10-CM | POA: Diagnosis not present

## 2022-07-09 DIAGNOSIS — G40909 Epilepsy, unspecified, not intractable, without status epilepticus: Secondary | ICD-10-CM | POA: Diagnosis not present

## 2022-07-09 LAB — CULTURE, BLOOD (ROUTINE X 2)
Culture: NO GROWTH
Culture: NO GROWTH
Special Requests: ADEQUATE

## 2022-07-09 NOTE — Progress Notes (Addendum)
Pulmonary Critical Care Medicine [facility]   PULMONARY CRITICAL CARE SERVICE  PROGRESS NOTE     Joshua Haley  UEK:[id number]  DOB: 11/14/1951   DOA: 06/19/2022  Referring Physician: Luna Kitchens, MD  HPI: Joshua Haley is a 71 y.o. male being followed for ventilator/airway/oxygen weaning Acute on Chronic Respiratory Failure.  Patient seen lying in bed, currently remains on T-bar 28%.  Does continue to have a moderate amount of secretions present.  No acute distress, no acute overnight events.  Medications: Reviewed on Rounds  Physical Exam:  Vitals: Temp 97.4, pulse 87, respirations 14, BP 151/84, SPO2 97%  Ventilator Settings T-bar 28%  General: Comfortable at this time Neck: supple Cardiovascular: no malignant arrhythmias Respiratory: Bilaterally coarse Skin: no rash seen on limited exam Musculoskeletal: No gross abnormality Psychiatric:unable to assess Neurologic:no involuntary movements         Lab Data:   Basic Metabolic Panel: Recent Labs  Lab 07/03/22 1811 07/06/22 0907 07/07/22 0829 07/08/22 0503  MG 1.7 1.7 1.7 1.8    ABG: No results for input(s): "PHART", "PCO2ART", "PO2ART", "HCO3", "O2SAT" in the last 168 hours.  Liver Function Tests: No results for input(s): "AST", "ALT", "ALKPHOS", "BILITOT", "PROT", "ALBUMIN" in the last 168 hours. No results for input(s): "LIPASE", "AMYLASE" in the last 168 hours. No results for input(s): "AMMONIA" in the last 168 hours.  CBC: No results for input(s): "WBC", "NEUTROABS", "HGB", "HCT", "MCV", "PLT" in the last 168 hours.  Cardiac Enzymes: No results for input(s): "CKTOTAL", "CKMB", "CKMBINDEX", "TROPONINI" in the last 168 hours.  BNP (last 3 results) No results for input(s): "BNP" in the last 8760 hours.  ProBNP (last 3 results) No results for input(s): "PROBNP" in the last 8760 hours.  Radiological Exams: No results found.  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  Continue with supportive care. Seizure disorder -no sign of active seizures at this time.  Continue with seizure precautions. Aspiration pneumonia-has been treated and clinically continues to improve.  Remains high risk for reaspiration events. Focal traumatic brain injury -no change noted.  Continue supportive care. Tracheostomy-remains in place and likely indefinitely.  Continue with trach care per protocol.  I have personally seen and evaluated the patient, evaluated laboratory and imaging results, formulated the assessment and plan and placed orders. The Patient requires high complexity decision making with multiple systems involvement.  Rounds were done with the Respiratory Therapy Director and Staff therapists and discussed with nursing staff also.  Yevonne Pax, MD [facility] Pulmonary Critical Care Medicine Sleep Medicine

## 2022-07-11 DIAGNOSIS — Z930 Tracheostomy status: Secondary | ICD-10-CM

## 2022-07-11 DIAGNOSIS — Z48811 Encounter for surgical aftercare following surgery on the nervous system: Secondary | ICD-10-CM | POA: Diagnosis not present

## 2022-07-11 DIAGNOSIS — J9621 Acute and chronic respiratory failure with hypoxia: Secondary | ICD-10-CM | POA: Diagnosis not present

## 2022-07-11 DIAGNOSIS — I2609 Other pulmonary embolism with acute cor pulmonale: Secondary | ICD-10-CM | POA: Diagnosis not present

## 2022-07-12 DIAGNOSIS — I2609 Other pulmonary embolism with acute cor pulmonale: Secondary | ICD-10-CM | POA: Diagnosis not present

## 2022-07-12 DIAGNOSIS — Z930 Tracheostomy status: Secondary | ICD-10-CM | POA: Diagnosis not present

## 2022-07-12 DIAGNOSIS — J9621 Acute and chronic respiratory failure with hypoxia: Secondary | ICD-10-CM | POA: Diagnosis not present

## 2022-07-12 DIAGNOSIS — Z48811 Encounter for surgical aftercare following surgery on the nervous system: Secondary | ICD-10-CM | POA: Diagnosis not present

## 2022-07-13 DIAGNOSIS — Z930 Tracheostomy status: Secondary | ICD-10-CM | POA: Diagnosis not present

## 2022-07-13 DIAGNOSIS — J9621 Acute and chronic respiratory failure with hypoxia: Secondary | ICD-10-CM | POA: Diagnosis not present

## 2022-07-13 DIAGNOSIS — Z48811 Encounter for surgical aftercare following surgery on the nervous system: Secondary | ICD-10-CM | POA: Diagnosis not present

## 2022-07-13 DIAGNOSIS — I2609 Other pulmonary embolism with acute cor pulmonale: Secondary | ICD-10-CM | POA: Diagnosis not present

## 2022-07-14 DIAGNOSIS — Z48811 Encounter for surgical aftercare following surgery on the nervous system: Secondary | ICD-10-CM | POA: Diagnosis not present

## 2022-07-14 DIAGNOSIS — J9621 Acute and chronic respiratory failure with hypoxia: Secondary | ICD-10-CM

## 2022-07-14 DIAGNOSIS — I2609 Other pulmonary embolism with acute cor pulmonale: Secondary | ICD-10-CM | POA: Diagnosis not present

## 2022-07-14 DIAGNOSIS — Z930 Tracheostomy status: Secondary | ICD-10-CM

## 2022-07-15 DIAGNOSIS — J9621 Acute and chronic respiratory failure with hypoxia: Secondary | ICD-10-CM | POA: Diagnosis not present

## 2022-07-15 DIAGNOSIS — Z48811 Encounter for surgical aftercare following surgery on the nervous system: Secondary | ICD-10-CM | POA: Diagnosis not present

## 2022-07-15 DIAGNOSIS — I2609 Other pulmonary embolism with acute cor pulmonale: Secondary | ICD-10-CM | POA: Diagnosis not present

## 2022-07-15 DIAGNOSIS — Z930 Tracheostomy status: Secondary | ICD-10-CM | POA: Diagnosis not present

## 2022-07-23 DIAGNOSIS — J9621 Acute and chronic respiratory failure with hypoxia: Secondary | ICD-10-CM | POA: Diagnosis not present

## 2022-07-23 DIAGNOSIS — Z930 Tracheostomy status: Secondary | ICD-10-CM | POA: Diagnosis not present

## 2022-07-23 DIAGNOSIS — I2609 Other pulmonary embolism with acute cor pulmonale: Secondary | ICD-10-CM | POA: Diagnosis not present

## 2022-07-23 DIAGNOSIS — Z48811 Encounter for surgical aftercare following surgery on the nervous system: Secondary | ICD-10-CM | POA: Diagnosis not present

## 2022-07-24 DIAGNOSIS — J9621 Acute and chronic respiratory failure with hypoxia: Secondary | ICD-10-CM | POA: Diagnosis not present

## 2022-07-24 DIAGNOSIS — Z930 Tracheostomy status: Secondary | ICD-10-CM | POA: Diagnosis not present

## 2022-07-24 DIAGNOSIS — Z48811 Encounter for surgical aftercare following surgery on the nervous system: Secondary | ICD-10-CM | POA: Diagnosis not present

## 2022-07-24 DIAGNOSIS — I2609 Other pulmonary embolism with acute cor pulmonale: Secondary | ICD-10-CM | POA: Diagnosis not present

## 2022-07-25 DIAGNOSIS — I2609 Other pulmonary embolism with acute cor pulmonale: Secondary | ICD-10-CM

## 2022-07-25 DIAGNOSIS — Z48811 Encounter for surgical aftercare following surgery on the nervous system: Secondary | ICD-10-CM

## 2022-07-25 DIAGNOSIS — Z930 Tracheostomy status: Secondary | ICD-10-CM

## 2022-07-25 DIAGNOSIS — J9621 Acute and chronic respiratory failure with hypoxia: Secondary | ICD-10-CM

## 2022-07-26 DIAGNOSIS — J9621 Acute and chronic respiratory failure with hypoxia: Secondary | ICD-10-CM | POA: Diagnosis not present

## 2022-07-26 DIAGNOSIS — Z930 Tracheostomy status: Secondary | ICD-10-CM | POA: Diagnosis not present

## 2022-07-26 DIAGNOSIS — Z48811 Encounter for surgical aftercare following surgery on the nervous system: Secondary | ICD-10-CM | POA: Diagnosis not present

## 2022-07-26 DIAGNOSIS — I2609 Other pulmonary embolism with acute cor pulmonale: Secondary | ICD-10-CM | POA: Diagnosis not present

## 2022-07-27 DIAGNOSIS — J9621 Acute and chronic respiratory failure with hypoxia: Secondary | ICD-10-CM | POA: Diagnosis not present

## 2022-07-27 DIAGNOSIS — Z48811 Encounter for surgical aftercare following surgery on the nervous system: Secondary | ICD-10-CM | POA: Diagnosis not present

## 2022-07-27 DIAGNOSIS — Z930 Tracheostomy status: Secondary | ICD-10-CM | POA: Diagnosis not present

## 2022-07-27 DIAGNOSIS — I2609 Other pulmonary embolism with acute cor pulmonale: Secondary | ICD-10-CM | POA: Diagnosis not present

## 2022-07-28 DIAGNOSIS — J9621 Acute and chronic respiratory failure with hypoxia: Secondary | ICD-10-CM

## 2022-07-28 DIAGNOSIS — I2609 Other pulmonary embolism with acute cor pulmonale: Secondary | ICD-10-CM

## 2022-07-28 DIAGNOSIS — Z930 Tracheostomy status: Secondary | ICD-10-CM

## 2022-07-28 DIAGNOSIS — Z48811 Encounter for surgical aftercare following surgery on the nervous system: Secondary | ICD-10-CM

## 2022-07-29 DIAGNOSIS — J9621 Acute and chronic respiratory failure with hypoxia: Secondary | ICD-10-CM

## 2022-07-29 DIAGNOSIS — I2609 Other pulmonary embolism with acute cor pulmonale: Secondary | ICD-10-CM

## 2022-07-29 DIAGNOSIS — Z930 Tracheostomy status: Secondary | ICD-10-CM

## 2022-07-29 DIAGNOSIS — Z48811 Encounter for surgical aftercare following surgery on the nervous system: Secondary | ICD-10-CM

## 2022-09-10 ENCOUNTER — Inpatient Hospital Stay (HOSPITAL_COMMUNITY): Payer: No Typology Code available for payment source

## 2022-09-10 ENCOUNTER — Emergency Department (HOSPITAL_COMMUNITY): Payer: No Typology Code available for payment source

## 2022-09-10 ENCOUNTER — Inpatient Hospital Stay (HOSPITAL_COMMUNITY)
Admission: EM | Admit: 2022-09-10 | Discharge: 2022-09-25 | DRG: 698 | Disposition: A | Discharge status: Skilled Nursing Facility | Payer: No Typology Code available for payment source | Source: Skilled Nursing Facility | Attending: Internal Medicine | Admitting: Internal Medicine

## 2022-09-10 DIAGNOSIS — E871 Hypo-osmolality and hyponatremia: Secondary | ICD-10-CM | POA: Diagnosis present

## 2022-09-10 DIAGNOSIS — T8383XA Hemorrhage of genitourinary prosthetic devices, implants and grafts, initial encounter: Secondary | ICD-10-CM | POA: Diagnosis present

## 2022-09-10 DIAGNOSIS — Z87891 Personal history of nicotine dependence: Secondary | ICD-10-CM

## 2022-09-10 DIAGNOSIS — J9611 Chronic respiratory failure with hypoxia: Secondary | ICD-10-CM | POA: Diagnosis not present

## 2022-09-10 DIAGNOSIS — Z66 Do not resuscitate: Secondary | ICD-10-CM | POA: Diagnosis present

## 2022-09-10 DIAGNOSIS — N390 Urinary tract infection, site not specified: Secondary | ICD-10-CM | POA: Diagnosis present

## 2022-09-10 DIAGNOSIS — R4182 Altered mental status, unspecified: Secondary | ICD-10-CM | POA: Diagnosis not present

## 2022-09-10 DIAGNOSIS — G9340 Encephalopathy, unspecified: Secondary | ICD-10-CM | POA: Diagnosis not present

## 2022-09-10 DIAGNOSIS — J690 Pneumonitis due to inhalation of food and vomit: Secondary | ICD-10-CM | POA: Diagnosis not present

## 2022-09-10 DIAGNOSIS — Y846 Urinary catheterization as the cause of abnormal reaction of the patient, or of later complication, without mention of misadventure at the time of the procedure: Secondary | ICD-10-CM | POA: Diagnosis present

## 2022-09-10 DIAGNOSIS — Z1629 Resistance to other single specified antibiotic: Secondary | ICD-10-CM | POA: Diagnosis present

## 2022-09-10 DIAGNOSIS — L89152 Pressure ulcer of sacral region, stage 2: Secondary | ICD-10-CM | POA: Diagnosis present

## 2022-09-10 DIAGNOSIS — E861 Hypovolemia: Secondary | ICD-10-CM | POA: Diagnosis present

## 2022-09-10 DIAGNOSIS — A419 Sepsis, unspecified organism: Principal | ICD-10-CM | POA: Diagnosis present

## 2022-09-10 DIAGNOSIS — Z885 Allergy status to narcotic agent status: Secondary | ICD-10-CM

## 2022-09-10 DIAGNOSIS — T83511A Infection and inflammatory reaction due to indwelling urethral catheter, initial encounter: Principal | ICD-10-CM | POA: Diagnosis present

## 2022-09-10 DIAGNOSIS — R1312 Dysphagia, oropharyngeal phase: Secondary | ICD-10-CM | POA: Diagnosis present

## 2022-09-10 DIAGNOSIS — Z6825 Body mass index (BMI) 25.0-25.9, adult: Secondary | ICD-10-CM

## 2022-09-10 DIAGNOSIS — R579 Shock, unspecified: Secondary | ICD-10-CM | POA: Diagnosis present

## 2022-09-10 DIAGNOSIS — J9621 Acute and chronic respiratory failure with hypoxia: Secondary | ICD-10-CM | POA: Diagnosis present

## 2022-09-10 DIAGNOSIS — E440 Moderate protein-calorie malnutrition: Secondary | ICD-10-CM | POA: Insufficient documentation

## 2022-09-10 DIAGNOSIS — N368 Other specified disorders of urethra: Secondary | ICD-10-CM

## 2022-09-10 DIAGNOSIS — R310 Gross hematuria: Secondary | ICD-10-CM | POA: Diagnosis not present

## 2022-09-10 DIAGNOSIS — L89892 Pressure ulcer of other site, stage 2: Secondary | ICD-10-CM | POA: Diagnosis present

## 2022-09-10 DIAGNOSIS — Z20822 Contact with and (suspected) exposure to covid-19: Secondary | ICD-10-CM | POA: Diagnosis present

## 2022-09-10 DIAGNOSIS — G40909 Epilepsy, unspecified, not intractable, without status epilepticus: Secondary | ICD-10-CM

## 2022-09-10 DIAGNOSIS — Z951 Presence of aortocoronary bypass graft: Secondary | ICD-10-CM

## 2022-09-10 DIAGNOSIS — A4159 Other Gram-negative sepsis: Secondary | ICD-10-CM | POA: Diagnosis present

## 2022-09-10 DIAGNOSIS — K219 Gastro-esophageal reflux disease without esophagitis: Secondary | ICD-10-CM | POA: Diagnosis present

## 2022-09-10 DIAGNOSIS — Z87820 Personal history of traumatic brain injury: Secondary | ICD-10-CM

## 2022-09-10 DIAGNOSIS — K9423 Gastrostomy malfunction: Secondary | ICD-10-CM | POA: Diagnosis not present

## 2022-09-10 DIAGNOSIS — N319 Neuromuscular dysfunction of bladder, unspecified: Secondary | ICD-10-CM | POA: Diagnosis present

## 2022-09-10 DIAGNOSIS — I10 Essential (primary) hypertension: Secondary | ICD-10-CM | POA: Diagnosis present

## 2022-09-10 DIAGNOSIS — Z930 Tracheostomy status: Secondary | ICD-10-CM | POA: Diagnosis not present

## 2022-09-10 DIAGNOSIS — E039 Hypothyroidism, unspecified: Secondary | ICD-10-CM | POA: Diagnosis present

## 2022-09-10 DIAGNOSIS — Z9049 Acquired absence of other specified parts of digestive tract: Secondary | ICD-10-CM

## 2022-09-10 DIAGNOSIS — G9341 Metabolic encephalopathy: Secondary | ICD-10-CM | POA: Diagnosis present

## 2022-09-10 DIAGNOSIS — Z794 Long term (current) use of insulin: Secondary | ICD-10-CM

## 2022-09-10 DIAGNOSIS — L89159 Pressure ulcer of sacral region, unspecified stage: Secondary | ICD-10-CM

## 2022-09-10 DIAGNOSIS — S06302S Unspecified focal traumatic brain injury with loss of consciousness of 31 minutes to 59 minutes, sequela: Secondary | ICD-10-CM

## 2022-09-10 DIAGNOSIS — E872 Acidosis, unspecified: Secondary | ICD-10-CM | POA: Diagnosis present

## 2022-09-10 DIAGNOSIS — D72829 Elevated white blood cell count, unspecified: Secondary | ICD-10-CM | POA: Diagnosis present

## 2022-09-10 DIAGNOSIS — I2510 Atherosclerotic heart disease of native coronary artery without angina pectoris: Secondary | ICD-10-CM

## 2022-09-10 DIAGNOSIS — R739 Hyperglycemia, unspecified: Secondary | ICD-10-CM

## 2022-09-10 DIAGNOSIS — B965 Pseudomonas (aeruginosa) (mallei) (pseudomallei) as the cause of diseases classified elsewhere: Secondary | ICD-10-CM | POA: Diagnosis present

## 2022-09-10 DIAGNOSIS — E1165 Type 2 diabetes mellitus with hyperglycemia: Secondary | ICD-10-CM | POA: Diagnosis present

## 2022-09-10 DIAGNOSIS — K567 Ileus, unspecified: Secondary | ICD-10-CM

## 2022-09-10 DIAGNOSIS — R6521 Severe sepsis with septic shock: Secondary | ICD-10-CM | POA: Diagnosis present

## 2022-09-10 DIAGNOSIS — E119 Type 2 diabetes mellitus without complications: Secondary | ICD-10-CM

## 2022-09-10 DIAGNOSIS — Z7984 Long term (current) use of oral hypoglycemic drugs: Secondary | ICD-10-CM

## 2022-09-10 DIAGNOSIS — Z880 Allergy status to penicillin: Secondary | ICD-10-CM

## 2022-09-10 DIAGNOSIS — Z79890 Hormone replacement therapy (postmenopausal): Secondary | ICD-10-CM

## 2022-09-10 DIAGNOSIS — T17908A Unspecified foreign body in respiratory tract, part unspecified causing other injury, initial encounter: Secondary | ICD-10-CM | POA: Insufficient documentation

## 2022-09-10 DIAGNOSIS — Z79899 Other long term (current) drug therapy: Secondary | ICD-10-CM

## 2022-09-10 DIAGNOSIS — B964 Proteus (mirabilis) (morganii) as the cause of diseases classified elsewhere: Secondary | ICD-10-CM | POA: Diagnosis present

## 2022-09-10 DIAGNOSIS — E876 Hypokalemia: Secondary | ICD-10-CM | POA: Diagnosis present

## 2022-09-10 DIAGNOSIS — S3730XA Unspecified injury of urethra, initial encounter: Secondary | ICD-10-CM | POA: Diagnosis present

## 2022-09-10 DIAGNOSIS — Z87442 Personal history of urinary calculi: Secondary | ICD-10-CM

## 2022-09-10 DIAGNOSIS — E878 Other disorders of electrolyte and fluid balance, not elsewhere classified: Secondary | ICD-10-CM | POA: Diagnosis present

## 2022-09-10 DIAGNOSIS — J189 Pneumonia, unspecified organism: Secondary | ICD-10-CM | POA: Diagnosis present

## 2022-09-10 DIAGNOSIS — R7881 Bacteremia: Secondary | ICD-10-CM | POA: Diagnosis not present

## 2022-09-10 LAB — BASIC METABOLIC PANEL
Anion gap: 19 — ABNORMAL HIGH (ref 5–15)
BUN: 22 mg/dL (ref 8–23)
CO2: 21 mmol/L — ABNORMAL LOW (ref 22–32)
Calcium: 9.8 mg/dL (ref 8.9–10.3)
Chloride: 98 mmol/L (ref 98–111)
Creatinine, Ser: 1.07 mg/dL (ref 0.61–1.24)
GFR, Estimated: >60 mL/min (ref >60)
Glucose, Bld: 249 mg/dL — ABNORMAL HIGH (ref 70–99)
Potassium: 3.4 mmol/L — ABNORMAL LOW (ref 3.5–5.1)
Sodium: 138 mmol/L (ref 135–145)

## 2022-09-10 LAB — PROTIME-INR
INR: 1.2 (ref 0.8–1.2)
Prothrombin Time: 14.9 seconds (ref 11.4–15.2)

## 2022-09-10 LAB — HEPATIC FUNCTION PANEL
ALT: 43 U/L (ref 0–44)
AST: 73 U/L — ABNORMAL HIGH (ref 15–41)
Albumin: 3 g/dL — ABNORMAL LOW (ref 3.5–5.0)
Alkaline Phosphatase: 165 U/L — ABNORMAL HIGH (ref 38–126)
Bilirubin, Direct: 0.4 mg/dL — ABNORMAL HIGH (ref 0.0–0.2)
Indirect Bilirubin: 0.8 mg/dL (ref 0.3–0.9)
Total Bilirubin: 1.2 mg/dL (ref 0.3–1.2)
Total Protein: 6.2 g/dL — ABNORMAL LOW (ref 6.5–8.1)

## 2022-09-10 LAB — LIPASE, BLOOD: Lipase: 30 U/L (ref 11–51)

## 2022-09-10 LAB — CBC WITH DIFFERENTIAL/PLATELET
Abs Immature Granulocytes: 0.02 10*3/uL (ref 0.00–0.07)
Basophils Absolute: 0 10*3/uL (ref 0.0–0.1)
Basophils Relative: 0 %
Eosinophils Absolute: 0 10*3/uL (ref 0.0–0.5)
Eosinophils Relative: 0 %
HCT: 50 % (ref 39.0–52.0)
Hemoglobin: 15.5 g/dL (ref 13.0–17.0)
Immature Granulocytes: 1 %
Lymphocytes Relative: 10 %
Lymphs Abs: 0.3 10*3/uL — ABNORMAL LOW (ref 0.7–4.0)
MCH: 28 pg (ref 26.0–34.0)
MCHC: 31 g/dL (ref 30.0–36.0)
MCV: 90.3 fL (ref 80.0–100.0)
Monocytes Absolute: 0.1 10*3/uL (ref 0.1–1.0)
Monocytes Relative: 2 %
Neutro Abs: 3 10*3/uL (ref 1.7–7.7)
Neutrophils Relative %: 87 %
Platelets: 380 10*3/uL (ref 150–400)
RBC: 5.54 MIL/uL (ref 4.22–5.81)
RDW: 16 % — ABNORMAL HIGH (ref 11.5–15.5)
WBC: 3.4 10*3/uL — ABNORMAL LOW (ref 4.0–10.5)
nRBC: 0 % (ref 0.0–0.2)

## 2022-09-10 LAB — APTT: aPTT: 29 seconds (ref 24–36)

## 2022-09-10 LAB — LACTIC ACID, PLASMA: Lactic Acid, Venous: 6.6 mmol/L (ref 0.5–1.9)

## 2022-09-10 MED ORDER — VANCOMYCIN HCL IN DEXTROSE 1-5 GM/200ML-% IV SOLN: 1000.0000 mg | Freq: Two times a day (BID) | INTRAVENOUS | Status: DC

## 2022-09-10 MED ORDER — VASOPRESSIN 20 UNITS/100 ML INFUSION FOR SHOCK
0.0000 [IU]/min | INTRAVENOUS | Status: DC
  Administered 2022-09-10: 0.03 [IU]/min via INTRAVENOUS
  Filled 2022-09-10 (×2): qty 100

## 2022-09-10 MED ORDER — ACETAMINOPHEN 325 MG PO TABS
650.0000 mg | ORAL_TABLET | Freq: Once | ORAL | Status: AC
  Administered 2022-09-10: 650 mg
  Filled 2022-09-10: qty 2

## 2022-09-10 MED ORDER — LACTATED RINGERS IV BOLUS (SEPSIS)
500.0000 mL | Freq: Once | INTRAVENOUS | Status: AC
Start: 2022-09-10 — End: 2022-09-10
  Administered 2022-09-10: 500 mL via INTRAVENOUS

## 2022-09-10 MED ORDER — NOREPINEPHRINE 4 MG/250ML-% IV SOLN
0.0000 ug/min | INTRAVENOUS | Status: DC
  Administered 2022-09-10: 20 ug/min via INTRAVENOUS
  Administered 2022-09-11: 25 ug/min via INTRAVENOUS
  Filled 2022-09-10 (×3): qty 250

## 2022-09-10 MED ORDER — LACTATED RINGERS IV SOLN: INTRAVENOUS | Status: DC

## 2022-09-10 MED ORDER — ONDANSETRON 4 MG PO TBDP: 8.0000 mg | ORAL_TABLET | Freq: Once | ORAL | Status: DC

## 2022-09-10 MED ORDER — DOCUSATE SODIUM 100 MG PO CAPS: 100.0000 mg | ORAL_CAPSULE | Freq: Two times a day (BID) | ORAL | Status: DC | PRN

## 2022-09-10 MED ORDER — VANCOMYCIN HCL 750 MG/150ML IV SOLN
750.0000 mg | Freq: Two times a day (BID) | INTRAVENOUS | Status: DC
  Administered 2022-09-11: 750 mg via INTRAVENOUS
  Filled 2022-09-10: qty 150

## 2022-09-10 MED ORDER — LEVOTHYROXINE SODIUM 137 MCG PO TABS
137.0000 ug | ORAL_TABLET | Freq: Every day | ORAL | Status: DC
  Administered 2022-09-11: 137 ug via ORAL
  Filled 2022-09-10: qty 1

## 2022-09-10 MED ORDER — LACTATED RINGERS IV BOLUS (SEPSIS)
1000.0000 mL | Freq: Once | INTRAVENOUS | Status: AC
  Administered 2022-09-10: 1000 mL via INTRAVENOUS

## 2022-09-10 MED ORDER — FENTANYL CITRATE PF 50 MCG/ML IJ SOSY: 25.0000 ug | PREFILLED_SYRINGE | INTRAMUSCULAR | Status: DC | PRN

## 2022-09-10 MED ORDER — ONDANSETRON HCL 4 MG/2ML IJ SOLN: 4.0000 mg | Freq: Four times a day (QID) | INTRAMUSCULAR | Status: DC | PRN

## 2022-09-10 MED ORDER — SODIUM CHLORIDE 0.9 % IV SOLN
2.0000 g | Freq: Three times a day (TID) | INTRAVENOUS | Status: DC
  Administered 2022-09-11: 2 g via INTRAVENOUS
  Filled 2022-09-10: qty 12.5

## 2022-09-10 MED ORDER — POLYETHYLENE GLYCOL 3350 17 G PO PACK: 17.0000 g | PACK | Freq: Every day | ORAL | Status: DC | PRN

## 2022-09-10 MED ORDER — ONDANSETRON HCL 4 MG/2ML IJ SOLN
4.0000 mg | Freq: Once | INTRAMUSCULAR | Status: AC
  Administered 2022-09-10: 4 mg via INTRAVENOUS
  Filled 2022-09-10: qty 2

## 2022-09-10 MED ORDER — VANCOMYCIN HCL IN DEXTROSE 1-5 GM/200ML-% IV SOLN
1000.0000 mg | Freq: Once | INTRAVENOUS | Status: DC
  Filled 2022-09-10: qty 200

## 2022-09-10 MED ORDER — NOREPINEPHRINE 4 MG/250ML-% IV SOLN
2.0000 ug/min | INTRAVENOUS | Status: DC
  Administered 2022-09-10: 10 ug/min via INTRAVENOUS

## 2022-09-10 MED ORDER — VANCOMYCIN HCL 1500 MG/300ML IV SOLN
1500.0000 mg | Freq: Once | INTRAVENOUS | Status: AC
  Administered 2022-09-10: 1500 mg via INTRAVENOUS
  Filled 2022-09-10: qty 300

## 2022-09-10 MED ORDER — NOREPINEPHRINE 4 MG/250ML-% IV SOLN
INTRAVENOUS | Status: AC
  Administered 2022-09-10: 4 mg
  Filled 2022-09-10: qty 250

## 2022-09-10 MED ORDER — CEFEPIME HCL 2 G IV SOLR
2.0000 g | Freq: Once | INTRAVENOUS | Status: AC
  Administered 2022-09-10: 2 g via INTRAVENOUS
  Filled 2022-09-10: qty 12.5

## 2022-09-10 NOTE — Progress Notes (Signed)
Responded to protocol consult for [place] IV due to vasopressor. Per provider, current plan is to place central line.

## 2022-09-10 NOTE — ED Triage Notes (Signed)
Pt BIB [facility] from [facility] after staff noticed blood coming from patient's foley catheter. Staff pulled the catheter and bleeding continued. Patient continues to bleed at this time.
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
## 2022-09-10 NOTE — Sepsis Progress Note (Signed)
Sepsis protocol is being followed by [facility]. 

## 2022-09-10 NOTE — ED Provider Notes (Signed)
[facility] EMERGENCY DEPARTMENT Provider Note   CSN: [id number] Arrival date & time: 09/10/22  1601     History  Chief Complaint  Patient presents with   Code Sepsis    Joshua Haley is a 71 y.o. male.  Patient is a 71 year old male with a past medical history of TBI, aphasia, chronic trach and PEG and indwelling Foley catheter in place presenting to the emergency department from [facility] for hematuria.  Per nursing home staff, the patient was found to have blood in his Foley catheter this morning.  They states that they remove the Foley catheter and he had significant bleeding from his urethra.  They state that it filled up a towel.  When medics arrived.  They state that he had still been bleeding but had slowed down and the amount of bleeding that he was having.  They state he remained hemodynamically stable in route.  They state he is not on any blood thinners.  The history is provided by the EMS personnel and the nursing home. The history is limited by the condition of the patient.  Hematuria       Home Medications Prior to Admission medications   Medication Sig Start Date End Date Taking? Authorizing Provider  aspirin 81 MG chewable tablet Chew 81 mg by mouth daily.    [provider]  Cholecalciferol (VITAMIN D3) 125 MCG (5000 UT) CAPS Take 5,000 Units by mouth daily.    [provider]  Cyanocobalamin 1000 MCG/15ML LIQD Take 1,000 mcg by mouth daily.    [provider]  Ferrous Sulfate (IRON) 28 MG TABS Take 1 tablet (28 mg total) by mouth daily. Can take any over the counter iron supplements after discharge. 01/22/20   Darlin Priestly, MD  glipiZIDE (GLUCOTROL) 5 MG tablet Take by mouth. 01/30/20 01/29/21  [provider]  glyBURIDE (DIABETA) 5 MG tablet Hold while inpatient. 01/22/20   Darlin Priestly, MD  insulin glargine (LANTUS) 100 UNIT/ML injection Inject 0.15 mLs (15 Units total) into the skin daily. Patient not taking:  Reported on 02/21/2020 01/23/20   Darlin Priestly, MD  isosorbide dinitrate (ISORDIL) 10 MG tablet Take by mouth. 01/30/20 01/29/21  [provider]  ketoconazole (NIZORAL) 2 % cream Apply 1 application topically 2 (two) times daily.    [provider]  ketorolac (ACULAR) 0.4 % SOLN Place 1 drop into the right eye 4 (four) times daily as needed (pain). 06/15/18   Merrily Brittle, MD  levothyroxine (SYNTHROID) 137 MCG tablet Take 137 mcg by mouth daily before breakfast.    [provider]  lisinopril (ZESTRIL) 20 MG tablet Take 1 tablet (20 mg total) by mouth daily. 01/22/20   Darlin Priestly, MD  meloxicam (MOBIC) 15 MG tablet Take 1 tablet (15 mg total) by mouth daily as needed for pain. 01/22/20   Darlin Priestly, MD  metFORMIN (GLUCOPHAGE) 1000 MG tablet Hold while inpatient. 01/22/20   Darlin Priestly, MD  metoprolol succinate (TOPROL-XL) 50 MG 24 hr tablet Take 50 mg by mouth daily. Take with or immediately following a meal.    [provider]  metoprolol tartrate (LOPRESSOR) 25 MG tablet Take by mouth. 01/30/20 01/29/21  [provider]  pantoprazole (PROTONIX) 40 MG tablet Take 40 mg by mouth daily.    [provider]      Allergies    Morphine and related and Penicillins    Review of Systems   Review of Systems  Genitourinary:  Positive for  hematuria.    Physical Exam Updated Vital Signs BP 104/64   Pulse (!) 115   Temp (!) 100.6 F (38.1 C) (Axillary)   Resp (!) 23   SpO2 93%  Physical Exam Vitals and nursing note reviewed.  Constitutional:      General: He is not in acute distress.    Comments: Chronically ill-appearing  HENT:     Head: Normocephalic and atraumatic.     Nose: Nose normal.     Mouth/Throat:     Mouth: Mucous membranes are moist.     Pharynx: Oropharynx is clear.  Eyes:     Extraocular Movements: Extraocular movements intact.     Conjunctiva/sclera: Conjunctivae normal.  Neck:     Comments: Trach in place Cardiovascular:     Rate  and Rhythm: Normal rate and regular rhythm.     Heart sounds: Normal heart sounds.  Pulmonary:     Effort: Pulmonary effort is normal.     Breath sounds: Normal breath sounds.  Abdominal:     General: Abdomen is flat.     Palpations: Abdomen is soft.     Tenderness: There is no abdominal tenderness.     Comments: G-tube in place with no surrounding erythema, warmth or drainage  Genitourinary:    Comments: Small amount of blood at the urethral meatus with no active hemorrhage, no testicular tenderness or swelling, no other evidence of GU trauma Musculoskeletal:        General: No swelling or deformity.     Cervical back: Normal range of motion and neck supple.  Skin:    General: Skin is warm and dry.  Neurological:     Mental Status: He is alert. Mental status is at baseline.     Comments: Awake, alert, aphasic, not following commands     ED Results / Procedures / Treatments   Labs (all labs ordered are listed, but only abnormal results are displayed) Labs Reviewed  LACTIC ACID, PLASMA - Abnormal; Notable for the following components:      Result Value   Lactic Acid, Venous 6.6 (*)    All other components within normal limits  HEPATIC FUNCTION PANEL - Abnormal; Notable for the following components:   Total Protein 6.2 (*)    Albumin 3.0 (*)    AST 73 (*)    Alkaline Phosphatase 165 (*)    Bilirubin, Direct 0.4 (*)    All other components within normal limits  BASIC METABOLIC PANEL - Abnormal; Notable for the following components:   Potassium 3.4 (*)    CO2 21 (*)    Glucose, Bld 249 (*)    Anion gap 19 (*)    All other components within normal limits  URINE CULTURE  CULTURE, BLOOD (SINGLE)  RESP PANEL BY RT-PCR (FLU A&B, COVID) ARPGX2  PROTIME-INR  APTT  LIPASE, BLOOD  CBC WITH DIFFERENTIAL/PLATELET  URINALYSIS, ROUTINE W REFLEX MICROSCOPIC  LACTIC ACID, PLASMA  CBC WITH DIFFERENTIAL/PLATELET  HIV ANTIBODY (ROUTINE TESTING W REFLEX)     EKG None  Radiology DG Chest Port 1 View  Result Date: 09/10/2022 CLINICAL DATA:  Blood coming from the patient's Foley catheter. EXAM: PORTABLE CHEST 1 VIEW COMPARISON:  May 14, 2021 FINDINGS: A tracheostomy tube is in place with its distal tip at the level of the clavicles. Multiple sternal wires and vascular clips are seen. The heart size and mediastinal contours are within normal limits. Both lungs are clear. Degenerative changes are seen throughout the thoracic spine. IMPRESSION: 1. Evidence  of prior median sternotomy/CABG. 2. No active cardiopulmonary disease. Electronically Signed   By: Aram Candela M.D.   On: 09/10/2022 19:38    Procedures .Critical Care  Performed by: Phoebe Sharps, DO Authorized by: Phoebe Sharps, DO   Critical care provider statement:    Critical care time (minutes):  60   Critical care time was exclusive of:  Separately billable procedures and treating other patients   Critical care was necessary to treat or prevent imminent or life-threatening deterioration of the following conditions:  Circulatory failure, respiratory failure, shock and sepsis   Critical care was time spent personally by me on the following activities:  Blood draw for specimens, discussions with consultants, evaluation of patient's response to treatment, examination of patient, obtaining history from patient or surrogate, ordering and performing treatments and interventions, ordering and review of laboratory studies, ordering and review of radiographic studies, pulse oximetry and re-evaluation of patient's condition   I assumed direction of critical care for this patient from another provider in my specialty: no     Care discussed with: admitting provider       Medications Ordered in ED Medications  lactated ringers infusion (has no administration in time range)  lactated ringers infusion (has no administration in time range)  vancomycin (VANCOREADY) IVPB 1500 mg/300 mL  (1,500 mg Intravenous New Bag/Given 09/10/22 2055)  vasopressin (PITRESSIN) 20 Units in sodium chloride 0.9 % 100 mL infusion-*FOR SHOCK* (0.03 Units/min Intravenous New Bag/Given 09/10/22 2006)  acetaminophen (TYLENOL) tablet 650 mg (has no administration in time range)  norepinephrine (LEVOPHED) 4mg  in (0.016 mg/mL) premix infusion (10 mcg/min Intravenous New Bag/Given 09/10/22 2048)  docusate sodium (COLACE) capsule 100 mg (has no administration in time range)  polyethylene glycol (MIRALAX / GLYCOLAX) packet 17 g (has no administration in time range)  levothyroxine (SYNTHROID) tablet 137 mcg (has no administration in time range)  norepinephrine (LEVOPHED) 4-5 MG/250ML-% infusion SOLN (0 mcg/kg/min  Stopped 09/10/22 2046)  ondansetron (ZOFRAN) injection 4 mg (4 mg Intravenous Given 09/10/22 1917)  lactated ringers bolus 1,000 mL (0 mLs Intravenous Stopped 09/10/22 2005)    And  lactated ringers bolus 1,000 mL (1,000 mLs Intravenous New Bag/Given 09/10/22 1911)    And  lactated ringers bolus 500 mL (500 mLs Intravenous New Bag/Given 09/10/22 2045)  ceFEPIme (MAXIPIME) 2 g in sodium chloride 0.9 % 100 mL IVPB (0 g Intravenous Stopped 09/10/22 1958)    ED Course/ Medical Decision Making/ A&P Clinical Course as of 09/10/22 2113  Thu Sep 10, 2022  2000 I spoke with Dr. 2001 of critical care who will evaluate the patient at bedside. Patient additional started on vaso for persistent hypotension [VK]  2032 Chest x-ray without evidence of aspiration.  Lactate is elevated.  He has been fluid resuscitated and receiving pressors and lactate will be trended.  Remainder of his labs are pending at this time. [VK]    Clinical Course User Index [VK] 2033, DO                           Medical Decision Making This patient presents to the ED with chief complaint(s) of hematuria with pertinent past medical history of TBI with chronic trach and PEG, neurogenic bladder with indwelling Foley  catheter which further complicates the presenting complaint. The complaint involves an extensive differential diagnosis and also carries with it a high risk of complications and morbidity.    The differential diagnosis includes  traumatic urethral injury from Foley catheter displacement, UTI, anemia  Additional history obtained: Additional history obtained from EMS  and nursing home/care facility Records reviewed previous admission documents  ED Course and Reassessment: Upon patient's initial arrival to the emergency department, his bleeding was controlled at urethral meatus.  Bleeding likely secondary to patient pulling out his Foley catheter and new Foley catheter will be placed.  UA will be evaluated to evaluate for UTI as possible cause of hematuria.  Patient will have labs to evaluate for anemia as he had history of significant bleeding at his nursing home.  While the patient was pending his work-up, he started to develop nausea and vomiting and shortly thereafter became tachycardic, hypotensive and hypoxic.  I immediately came to evaluate the patient at bedside.  The patient was immediately repositioned, respiratory was called.  IV access was obtained.  His mouth was suctioned and he was placed on nasal cannula pending respiratory.  When respiratory arrived they placed the patient on 15 L trach collar with improvement of his hypoxia and suctioned his trach with significant vomitus suctioned.  Patient was started on 2 L of pressure bag IV fluid and remained hypotensive in the 60s systolic.  He was started on peripheral levo and had expanded work-up to evaluate for sepsis and other causes of his shock.  He was found to be febrile and was given Tylenol for his fever.  He was started on broad-spectrum antibiotics.  Critical care will be consulted.  Independent labs interpretation:  The following labs were independently interpreted: Elevated lactate, mild hypokalemia, remainder of labs  pending  Independent visualization of imaging: - I independently visualized the following imaging with scope of interpretation limited to determining acute life threatening conditions related to emergency care: chest x-ray, which revealed no acute disease  Consultation: - Consulted or discussed management/test interpretation w/ external professional: Intensivist  Consideration for admission or further workup: Patient requires admission for further management of his shock likely in the setting of sepsis Social Determinants of health: N/A    Amount and/or Complexity of Data Reviewed Labs: ordered. Radiology: ordered. ECG/medicine tests: ordered.  Risk OTC drugs. Prescription drug management. Decision regarding hospitalization.           Final Clinical Impression(s) / ED Diagnoses Final diagnoses:  Septic shock (HCC)  Gross hematuria    Rx / DC Orders ED Discharge Orders     None         Phoebe Sharps, DO 09/10/22 2113

## 2022-09-10 NOTE — Sepsis Progress Note (Signed)
Notified bedside nurse via [place] of need to administer remaining fluid bolus if able to meet protocol.

## 2022-09-10 NOTE — Progress Notes (Addendum)
Pharmacy Antibiotic Note  Joshua Haley is a 71 y.o. male admitted on 09/10/2022 with sepsis.  Pharmacy has been consulted for vancomycin and cefepime dosing. PMH includes TBI, aphasia, chronic trach, PEG, and indwelling foley catheter who presented with hematuria. Scr 1.07 (bl ~ 0.6), lactate 6.6, fever of 100.6.  Plan: Vancomycin 1500mg  IV x1 dose Vancomycin 750mg  IV every 12 hours (eAUC 416) Cefepime 2g q8h    Temp (24hrs), Avg:100.6 F (38.1 C), Min:100.6 F (38.1 C), Max:100.6 F (38.1 C)   CrCl cannot be calculated (Patient's most recent lab result is older than the maximum 21 days allowed.).    Allergies  Allergen Reactions   Morphine And Related    Penicillins Other (See Comments)    Antimicrobials this admission: 9/14 Vanc >> pending 9/14 Cefepime >> pending  Microbiology results: 9/14 BCx: pending 9/14 UCx: pending  9/14 MRSA PCR: pending  Thank you for allowing pharmacy to be a part of this patient's care.  10/14, PharmD. [facility] Acute Care PGY-1  09/10/2022 9:36 PM

## 2022-09-10 NOTE — Procedures (Signed)
Central Venous Catheter Insertion Procedure Note  Joshua Haley  [id number]  [birth year]  Date:09/10/22  Time:10:36 PM   Provider Performing:Roberto Hlavaty R Giuseppe Duchemin   Procedure: Insertion of Non-tunneled Central Venous [phone]) with US guidance ([id number])   Indication(s) Medication administration  Consent Risks of the procedure as well as the alternatives and risks of each were explained to the patient and/or caregiver.  Consent for the procedure was obtained and is signed in the bedside chart  Anesthesia Topical only with 1% lidocaine   Timeout Verified patient identification, verified procedure, site/side was marked, verified correct patient position, special equipment/implants available, medications/allergies/relevant history reviewed, required imaging and test results available.  Sterile Technique Maximal sterile technique including full sterile barrier drape, hand hygiene, sterile gown, sterile gloves, mask, hair covering, sterile ultrasound probe cover (if used).  Procedure Description Area of catheter insertion was cleaned with chlorhexidine and draped in sterile fashion.  With real-time ultrasound guidance a central venous catheter was placed into the right femoral vein. Nonpulsatile blood flow and easy flushing noted in all ports.  The catheter was sutured in place and sterile dressing applied.  Complications/Tolerance None; patient tolerated the procedure well. Chest X-ray is ordered to verify placement for internal jugular or subclavian cannulation.   Chest x-ray is not ordered for femoral cannulation.  EBL Minimal  Specimen(s) None  Joshua Gasman Joshua Santee, PA-C

## 2022-09-11 ENCOUNTER — Inpatient Hospital Stay (HOSPITAL_COMMUNITY): Payer: No Typology Code available for payment source

## 2022-09-11 DIAGNOSIS — A419 Sepsis, unspecified organism: Secondary | ICD-10-CM

## 2022-09-11 DIAGNOSIS — G9340 Encephalopathy, unspecified: Secondary | ICD-10-CM

## 2022-09-11 DIAGNOSIS — J690 Pneumonitis due to inhalation of food and vomit: Secondary | ICD-10-CM

## 2022-09-11 DIAGNOSIS — R6521 Severe sepsis with septic shock: Secondary | ICD-10-CM

## 2022-09-11 DIAGNOSIS — J9621 Acute and chronic respiratory failure with hypoxia: Secondary | ICD-10-CM | POA: Diagnosis not present

## 2022-09-11 DIAGNOSIS — R739 Hyperglycemia, unspecified: Secondary | ICD-10-CM

## 2022-09-11 DIAGNOSIS — L89159 Pressure ulcer of sacral region, unspecified stage: Secondary | ICD-10-CM

## 2022-09-11 DIAGNOSIS — I2510 Atherosclerotic heart disease of native coronary artery without angina pectoris: Secondary | ICD-10-CM

## 2022-09-11 DIAGNOSIS — K567 Ileus, unspecified: Secondary | ICD-10-CM

## 2022-09-11 DIAGNOSIS — N368 Other specified disorders of urethra: Secondary | ICD-10-CM

## 2022-09-11 LAB — BLOOD CULTURE ID PANEL (REFLEXED) - BCID2

## 2022-09-11 LAB — BASIC METABOLIC PANEL
Anion gap: 11 (ref 5–15)
Anion gap: 12 (ref 5–15)
BUN: 24 mg/dL — ABNORMAL HIGH (ref 8–23)
BUN: 18 mg/dL (ref 8–23)
CO2: 23 mmol/L (ref 22–32)
CO2: 22 mmol/L (ref 22–32)
Calcium: 9.1 mg/dL (ref 8.9–10.3)
Calcium: 9 mg/dL (ref 8.9–10.3)
Chloride: 100 mmol/L (ref 98–111)
Chloride: 99 mmol/L (ref 98–111)
Creatinine, Ser: 1.13 mg/dL (ref 0.61–1.24)
Creatinine, Ser: 0.68 mg/dL (ref 0.61–1.24)
GFR, Estimated: >60 mL/min (ref >60)
GFR, Estimated: >60 mL/min (ref >60)
Glucose, Bld: 287 mg/dL — ABNORMAL HIGH (ref 70–99)
Glucose, Bld: 213 mg/dL — ABNORMAL HIGH (ref 70–99)
Potassium: 3.7 mmol/L (ref 3.5–5.1)
Potassium: 3.6 mmol/L (ref 3.5–5.1)
Sodium: 134 mmol/L — ABNORMAL LOW (ref 135–145)
Sodium: 133 mmol/L — ABNORMAL LOW (ref 135–145)

## 2022-09-11 LAB — MAGNESIUM
Magnesium: 1.2 mg/dL — ABNORMAL LOW (ref 1.7–2.4)
Magnesium: 1.4 mg/dL — ABNORMAL LOW (ref 1.7–2.4)
Magnesium: 1.4 mg/dL — ABNORMAL LOW (ref 1.7–2.4)

## 2022-09-11 LAB — CBC
HCT: 38.9 % — ABNORMAL LOW (ref 39.0–52.0)
Hemoglobin: 12.9 g/dL — ABNORMAL LOW (ref 13.0–17.0)
MCH: 28.7 pg (ref 26.0–34.0)
MCHC: 33.2 g/dL (ref 30.0–36.0)
MCV: 86.6 fL (ref 80.0–100.0)
Platelets: 290 10*3/uL (ref 150–400)
RBC: 4.49 MIL/uL (ref 4.22–5.81)
RDW: 15.9 % — ABNORMAL HIGH (ref 11.5–15.5)
WBC: 15.1 10*3/uL — ABNORMAL HIGH (ref 4.0–10.5)
nRBC: 0 % (ref 0.0–0.2)

## 2022-09-11 LAB — URINALYSIS, ROUTINE W REFLEX MICROSCOPIC
Bilirubin Urine: NEGATIVE
Glucose, UA: NEGATIVE mg/dL
Ketones, ur: NEGATIVE mg/dL
Nitrite: POSITIVE — AB
Protein, ur: 100 mg/dL — AB
RBC / HPF: >50 RBC/hpf — ABNORMAL HIGH (ref 0–5)
Specific Gravity, Urine: 1.015 (ref 1.005–1.030)
WBC, UA: >50 WBC/hpf — ABNORMAL HIGH (ref 0–5)
pH: 8 (ref 5.0–8.0)

## 2022-09-11 LAB — GLUCOSE, CAPILLARY
Glucose-Capillary: 288 mg/dL — ABNORMAL HIGH (ref 70–99)
Glucose-Capillary: 235 mg/dL — ABNORMAL HIGH (ref 70–99)
Glucose-Capillary: 279 mg/dL — ABNORMAL HIGH (ref 70–99)
Glucose-Capillary: 223 mg/dL — ABNORMAL HIGH (ref 70–99)
Glucose-Capillary: 185 mg/dL — ABNORMAL HIGH (ref 70–99)
Glucose-Capillary: 245 mg/dL — ABNORMAL HIGH (ref 70–99)

## 2022-09-11 LAB — MRSA NEXT GEN BY PCR, NASAL: MRSA by PCR Next Gen: NOT DETECTED

## 2022-09-11 LAB — LACTIC ACID, PLASMA
Lactic Acid, Venous: 4.9 mmol/L (ref 0.5–1.9)
Lactic Acid, Venous: 4.7 mmol/L (ref 0.5–1.9)
Lactic Acid, Venous: 3.7 mmol/L (ref 0.5–1.9)

## 2022-09-11 LAB — PHOSPHORUS
Phosphorus: 2.7 mg/dL (ref 2.5–4.6)
Phosphorus: 2.3 mg/dL — ABNORMAL LOW (ref 2.5–4.6)

## 2022-09-11 LAB — HIV ANTIBODY (ROUTINE TESTING W REFLEX): HIV Screen 4th Generation wRfx: NONREACTIVE

## 2022-09-11 LAB — RESP PANEL BY RT-PCR (FLU A&B, COVID) ARPGX2
Influenza A by PCR: NEGATIVE
Influenza B by PCR: NEGATIVE
SARS Coronavirus 2 by RT PCR: NEGATIVE

## 2022-09-11 MED ORDER — VITAL HIGH PROTEIN PO LIQD
1000.0000 mL | ORAL | Status: DC
  Administered 2022-09-11: 1000 mL

## 2022-09-11 MED ORDER — LEVOTHYROXINE SODIUM 137 MCG PO TABS
137.0000 ug | ORAL_TABLET | Freq: Every day | ORAL | Status: DC
  Administered 2022-09-12: 137 ug
  Filled 2022-09-11: qty 1

## 2022-09-11 MED ORDER — AMANTADINE HCL 50 MG/5ML PO SOLN
100.0000 mg | Freq: Every day | ORAL | Status: DC
  Administered 2022-09-11 – 2022-09-25 (×15): 100 mg
  Filled 2022-09-11 (×17): qty 10

## 2022-09-11 MED ORDER — POTASSIUM CHLORIDE 20 MEQ PO PACK
40.0000 meq | PACK | Freq: Once | ORAL | Status: AC
  Administered 2022-09-11: 40 meq
  Filled 2022-09-11: qty 2

## 2022-09-11 MED ORDER — ZINC OXIDE 40 % EX OINT
TOPICAL_OINTMENT | Freq: Two times a day (BID) | CUTANEOUS | Status: DC
  Administered 2022-09-19: 1 via TOPICAL
  Filled 2022-09-11 (×4): qty 57

## 2022-09-11 MED ORDER — LIOTHYRONINE SODIUM 5 MCG PO TABS
7.5000 ug | ORAL_TABLET | Freq: Every day | ORAL | Status: DC
  Administered 2022-09-11 – 2022-09-12 (×2): 7.5 ug
  Filled 2022-09-11 (×2): qty 2

## 2022-09-11 MED ORDER — LEVETIRACETAM 100 MG/ML PO SOLN
250.0000 mg | Freq: Two times a day (BID) | ORAL | Status: DC
  Administered 2022-09-11 – 2022-09-25 (×29): 250 mg
  Filled 2022-09-11 (×29): qty 5

## 2022-09-11 MED ORDER — MODAFINIL 100 MG PO TABS
50.0000 mg | ORAL_TABLET | Freq: Every day | ORAL | Status: DC
  Administered 2022-09-11 – 2022-09-25 (×15): 50 mg
  Filled 2022-09-11 (×15): qty 1

## 2022-09-11 MED ORDER — INSULIN ASPART 100 UNIT/ML IJ SOLN
0.0000 [IU] | INTRAMUSCULAR | Status: DC
  Administered 2022-09-11: 5 [IU] via SUBCUTANEOUS
  Administered 2022-09-11: 8 [IU] via SUBCUTANEOUS
  Administered 2022-09-11: 3 [IU] via SUBCUTANEOUS
  Administered 2022-09-11 (×2): 8 [IU] via SUBCUTANEOUS
  Administered 2022-09-12: 5 [IU] via SUBCUTANEOUS
  Administered 2022-09-12: 8 [IU] via SUBCUTANEOUS
  Administered 2022-09-12: 5 [IU] via SUBCUTANEOUS
  Administered 2022-09-12 (×2): 8 [IU] via SUBCUTANEOUS
  Administered 2022-09-12: 5 [IU] via SUBCUTANEOUS
  Administered 2022-09-12 – 2022-09-13 (×3): 8 [IU] via SUBCUTANEOUS

## 2022-09-11 MED ORDER — CHLORHEXIDINE GLUCONATE CLOTH 2 % EX PADS
6.0000 | MEDICATED_PAD | Freq: Every day | CUTANEOUS | Status: DC
  Administered 2022-09-11 – 2022-09-25 (×17): 6 via TOPICAL

## 2022-09-11 MED ORDER — SERTRALINE HCL 20 MG/ML PO CONC
25.0000 mg | Freq: Every day | ORAL | Status: DC
  Administered 2022-09-11 – 2022-09-13 (×3): 25 mg
  Filled 2022-09-11 (×3): qty 1.25

## 2022-09-11 MED ORDER — ORAL CARE MOUTH RINSE
15.0000 mL | OROMUCOSAL | Status: DC
  Administered 2022-09-11: 15 mL via OROMUCOSAL

## 2022-09-11 MED ORDER — ORAL CARE MOUTH RINSE: 15.0000 mL | OROMUCOSAL | Status: DC | PRN

## 2022-09-11 MED ORDER — JUVEN PO PACK
1.0000 | PACK | Freq: Two times a day (BID) | ORAL | Status: DC
  Administered 2022-09-12 – 2022-09-25 (×28): 1
  Filled 2022-09-11 (×27): qty 1

## 2022-09-11 MED ORDER — LACTATED RINGERS IV BOLUS
1000.0000 mL | Freq: Once | INTRAVENOUS | Status: AC
  Administered 2022-09-11: 1000 mL via INTRAVENOUS

## 2022-09-11 MED ORDER — INSULIN GLARGINE-YFGN 100 UNIT/ML ~~LOC~~ SOLN
10.0000 [IU] | Freq: Every day | SUBCUTANEOUS | Status: DC
  Administered 2022-09-11 – 2022-09-12 (×2): 10 [IU] via SUBCUTANEOUS
  Filled 2022-09-11 (×2): qty 0.1

## 2022-09-11 MED ORDER — VITAL 1.5 CAL PO LIQD
1000.0000 mL | ORAL | Status: DC
  Administered 2022-09-11 – 2022-09-15 (×5): 1000 mL
  Filled 2022-09-11 (×11): qty 1000

## 2022-09-11 MED ORDER — SODIUM CHLORIDE 0.9 % IV SOLN
2.0000 g | INTRAVENOUS | Status: DC
  Administered 2022-09-11 – 2022-09-17 (×7): 2 g via INTRAVENOUS
  Filled 2022-09-11 (×7): qty 20

## 2022-09-11 MED ORDER — SODIUM CHLORIDE 0.9% FLUSH
10.0000 mL | INTRAVENOUS | Status: DC | PRN
  Administered 2022-09-18: 10 mL

## 2022-09-11 MED ORDER — MAGNESIUM SULFATE 2 GM/50ML IV SOLN
2.0000 g | Freq: Once | INTRAVENOUS | Status: AC
  Administered 2022-09-11: 2 g via INTRAVENOUS
  Filled 2022-09-11: qty 50

## 2022-09-11 MED ORDER — PROSOURCE TF20 ENFIT COMPATIBL EN LIQD
60.0000 mL | Freq: Every day | ENTERAL | Status: DC
  Administered 2022-09-11 – 2022-09-18 (×8): 60 mL
  Filled 2022-09-11 (×8): qty 60

## 2022-09-11 MED ORDER — LACTATED RINGERS IV SOLN: INTRAVENOUS | Status: DC

## 2022-09-11 MED ORDER — NOREPINEPHRINE 16 MG/250ML-% IV SOLN
0.0000 ug/min | INTRAVENOUS | Status: DC
  Administered 2022-09-11 (×2): 25 ug/min via INTRAVENOUS
  Administered 2022-09-12: 22 ug/min via INTRAVENOUS
  Administered 2022-09-12: 13 ug/min via INTRAVENOUS
  Filled 2022-09-11 (×4): qty 250

## 2022-09-11 MED ORDER — VASOPRESSIN 20 UNITS/100 ML INFUSION FOR SHOCK
0.0000 [IU]/min | INTRAVENOUS | Status: DC
  Administered 2022-09-11 – 2022-09-13 (×4): 0.03 [IU]/min via INTRAVENOUS
  Filled 2022-09-11 (×4): qty 100

## 2022-09-11 MED ORDER — SODIUM CHLORIDE 0.9% FLUSH
10.0000 mL | Freq: Two times a day (BID) | INTRAVENOUS | Status: DC
  Administered 2022-09-11 – 2022-09-13 (×6): 10 mL
  Administered 2022-09-14: 20 mL
  Administered 2022-09-14 – 2022-09-25 (×20): 10 mL

## 2022-09-11 NOTE — Progress Notes (Signed)
eLink Physician-Brief Progress Note Patient Name: MAGUIRE SIME DOB: 21-Oct-1951 MRN: [id number]   Date of Service  09/11/2022  HPI/Events of Note  Hypokalemia  Hypomagnesemia - K+ = 3.6, Mg++ = 1.4 and Creatinine = 0.68.  eICU Interventions  Will replace K+ and Mg++.       Intervention Category Major Interventions: Electrolyte abnormality - evaluation and management  Jaydalynn Olivero Eugene 09/11/2022, 7:24 PM

## 2022-09-11 NOTE — Progress Notes (Signed)
Inpatient Diabetes Program Recommendations  AACE/ADA: New Consensus Statement on Inpatient Glycemic Control   Target Ranges:  Prepandial:   less than 140 mg/dL      Peak postprandial:   less than 180 mg/dL (1-2 hours)      Critically ill patients:  140 - 180 mg/dL    Latest Reference Range & Units 09/11/22 05:21  Glucose 70 - 99 mg/dL 494 (H)    Latest Reference Range & Units 09/11/22 03:23 09/11/22 07:41  Glucose-Capillary 70 - 99 mg/dL 496 (H) 759 (H)   Review of Glycemic Control  Diabetes history: DM2 Outpatient Diabetes medications: Lantus 15 units daily (taking 8 units daily),Regular 12 units Q6H; Glucerna 60 ml/hr Current orders for Inpatient glycemic control: Semglee 10 units daily (ordered today), Novolog 0-15 units Q4H; Vital @ 40 ml/hr  Inpatient Diabetes Program Recommendations:    Insulin: Noted Semglee 10 units daily ordered today. Will likely need to order Novolog tube feeding coverage once tube feeds are started.   Thanks, Orlando Penner, RN, MSN, CDCES Diabetes Coordinator Inpatient Diabetes Program [phone] (Team Pager from 8am to 5pm)

## 2022-09-11 NOTE — Consult Note (Addendum)
WOC Nurse Consult Note: Reason for Consult: Consult requested for sacrum/buttocks.  Pt is critically ill in ICU with multiple systemic factors which can impair healing.  He is currently incontinent of large amt thick green stool which has become trapped underneath the sacral foam dressing. Wound type: Upper sacrum/buttocks with red moist macerated skin and patchy areas have evolved into Stage 2 wounds which are pink and moist.  Affected area is approx 15X12X.1cm.  Left perineum skin fold also with the same appearance; 2X.5X.1cm pink moist partial thickness fissure related to moisture.  ICD-10 CM Codes for Irritant Dermatitis L24A2 - Due to fecal, urinary or dual incontinence L30.4  - Erythema intertrigo; genital/thigh intertrigo.  Pressure Injury POA: Yes  Dressing procedure/placement/frequency: Pt is on a low airloss mattress to reduce pressure. Topical treatment orders provided for bedside nurses to perform as follows to repel moisture, protect skin, and promote healing: Apply Desitin to sacrum/buttocks BID and with each turning and cleaning session.  Leave foam dressing off, since it is trapping stool underneath. Please re-consult if further assistance is needed.  Thank-you,  Cammie Mcgee MSN, RN, CWOCN, [place], CNS [phone]

## 2022-09-11 NOTE — Progress Notes (Signed)
PHARMACY - PHYSICIAN COMMUNICATION CRITICAL VALUE ALERT - BLOOD CULTURE IDENTIFICATION (BCID)  ALA Haley is an 71 y.o. male who presented to [facility] on 09/10/2022 with a chief complaint of septic shock.   Assessment:  1/4 BCX proteus with no resistance detected suspected source likely urinary   Name of physician (or Provider) Contacted: Zenia Resides, NP   Current antibiotics: Cefepime   Changes to prescribed antibiotics recommended:  Change to ceftriaxone 2G Q24H. Provider accepted recommendation.   Results for orders placed or performed during the hospital encounter of 09/10/22  Blood Culture ID Panel (Reflexed) (Collected: 09/10/2022  7:09 PM)  Result Value Ref Range   Enterococcus faecalis NOT DETECTED NOT DETECTED   Enterococcus Faecium NOT DETECTED NOT DETECTED   Listeria monocytogenes NOT DETECTED NOT DETECTED   Staphylococcus species NOT DETECTED NOT DETECTED   Staphylococcus aureus (BCID) NOT DETECTED NOT DETECTED   Staphylococcus epidermidis NOT DETECTED NOT DETECTED   Staphylococcus lugdunensis NOT DETECTED NOT DETECTED   Streptococcus species NOT DETECTED NOT DETECTED   Streptococcus agalactiae NOT DETECTED NOT DETECTED   Streptococcus pneumoniae NOT DETECTED NOT DETECTED   Streptococcus pyogenes NOT DETECTED NOT DETECTED   A.calcoaceticus-baumannii NOT DETECTED NOT DETECTED   Bacteroides fragilis NOT DETECTED NOT DETECTED   Enterobacterales DETECTED (A) NOT DETECTED   Enterobacter cloacae complex NOT DETECTED NOT DETECTED   Escherichia coli NOT DETECTED NOT DETECTED   Klebsiella aerogenes NOT DETECTED NOT DETECTED   Klebsiella oxytoca NOT DETECTED NOT DETECTED   Klebsiella pneumoniae NOT DETECTED NOT DETECTED   Proteus species DETECTED (A) NOT DETECTED   Salmonella species NOT DETECTED NOT DETECTED   Serratia marcescens NOT DETECTED NOT DETECTED   Haemophilus influenzae NOT DETECTED NOT DETECTED   Neisseria meningitidis NOT DETECTED NOT DETECTED    Pseudomonas aeruginosa NOT DETECTED NOT DETECTED   Stenotrophomonas maltophilia NOT DETECTED NOT DETECTED   Candida albicans NOT DETECTED NOT DETECTED   Candida auris NOT DETECTED NOT DETECTED   Candida glabrata NOT DETECTED NOT DETECTED   Candida krusei NOT DETECTED NOT DETECTED   Candida parapsilosis NOT DETECTED NOT DETECTED   Candida tropicalis NOT DETECTED NOT DETECTED   Cryptococcus neoformans/gattii NOT DETECTED NOT DETECTED   CTX-M ESBL NOT DETECTED NOT DETECTED   Carbapenem resistance IMP NOT DETECTED NOT DETECTED   Carbapenem resistance KPC NOT DETECTED NOT DETECTED   Carbapenem resistance NDM NOT DETECTED NOT DETECTED   Carbapenem resist OXA 48 LIKE NOT DETECTED NOT DETECTED   Carbapenem resistance VIM NOT DETECTED NOT DETECTED    [name] 09/11/2022  12:39 PM

## 2022-09-11 NOTE — Progress Notes (Signed)
eLink Physician-Brief Progress Note Patient Name: Joshua Haley DOB: [birth year] MRN: [id number]   Date of Service  09/11/2022  HPI/Events of Note  Lactic Acid = 4.7 --> 3.7. Lactic Acid clearing slowly.   eICU Interventions  Plan: Bolus with LR 1 liter IV over 1 hour.      Intervention Category Major Interventions: Acid-Base disturbance - evaluation and management  [name] Eugene 09/11/2022, 7:13 PM

## 2022-09-11 NOTE — Progress Notes (Signed)
Pharmacy Electrolyte Replacement  Recent Labs:  Recent Labs    09/11/22 0521  K 3.7  CREATININE 1.13    Low Critical Values (K </= 2.5, Phos </= 1, Mg </= 1) Present: None  Plan: 40 mEq KCL x 1 per protocol.
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
## 2022-09-11 NOTE — Progress Notes (Addendum)
Initial Nutrition Assessment  DOCUMENTATION CODES:   Non-severe (moderate) malnutrition in context of chronic illness  INTERVENTION:  Adjust TF formula as follows via PEG: Vital 1.5 at 60 ml/h (1440 ml per day) Prosource TF20 1x/d  Provides 2240 kcal, 117 gm protein, 1100 ml free water daily  1 packet Juven BID, each packet provides 95 calories, 2.5 grams of protein (collagen), and 9.8 grams of carbohydrate (3 grams sugar); also contains 7 grams of L-arginine and L-glutamine, 300 mg vitamin C, 15 mg vitamin E, 1.2 mcg vitamin B-12, 9.5 mg zinc, 200 mg calcium, and 1.5 g  Calcium Beta-hydroxy-Beta-methylbutyrate to support wound healing  NUTRITION DIAGNOSIS:   Moderate Malnutrition related to chronic illness as evidenced by mild muscle depletion, mild fat depletion.  GOAL:   Patient will meet greater than or equal to 90% of their needs  MONITOR:   TF tolerance, I & O's, Labs  REASON FOR ASSESSMENT:  Consult Enteral/tube feeding initiation and management  ASSESSMENT:   Pt with hx of TBI, aphasia, chronic trach and PEG and indwelling Foley presented from [facility] to ED with hematuria.  Pt found to have septic shock from UTI and aspiration pneumonia.  Reviewed chart and pt recently hospitalized at a [facility] hospital and RD mentions that at [facility] pt had been receiving Glucerna 1.5 @ 50 with prostat BID. Started on TF protocol, Vital High Protein currently infusing at 109mL/h.  Pt requiring pressor support x 2 this afternoon.   Resting in bed, no family present. Will adjust TF to better meet nutrition needs. Discussed with RN.   Temp (24hrs), Avg:99.8 F (37.7 C), Min:98.2 F (36.8 C), Max:100.9 F (38.3 C)   Intake/Output Summary (Last 24 hours) at 09/11/2022 1648 Last data filed at 09/11/2022 1500 Gross per 24 hour  Intake 1187.8 ml  Output 965 ml  Net 222.8 ml  Net IO Since Admission: 222.8 mL [09/11/22 1648]  Nutritionally Relevant Medications: Scheduled Meds:   feeding supplement (PROSource TF20)  60 mL Per Tube Daily   feeding supplement (VITAL HIGH PROTEIN)  1,000 mL Per Tube Q24H   insulin aspart  0-15 Units Subcutaneous Q4H   insulin glargine-yfgn  10 Units Subcutaneous Daily   liothyronine  7.5 mcg Per Tube Daily   Continuous Infusions:  cefTRIAXone (ROCEPHIN)  IV 2 g (09/11/22 1308)   lactated ringers 75 mL/hr at 09/11/22 1200   norepinephrine (LEVOPHED) Adult infusion 25 mcg/min (09/11/22 1200)   PRN Meds: docusate sodium, ondansetron IV, polyethylene glycol  Labs Reviewed: Na 134 BUN 24 Mg 1.2 CBG ranges from 223-279 mg/dL over the last 24 hours HgbA1c 7.5%  NUTRITION - FOCUSED PHYSICAL EXAM: Flowsheet Row Most Recent Value  Orbital Region Mild depletion  Upper Arm Region No depletion  Thoracic and Lumbar Region Mild depletion  Buccal Region Mild depletion  Temple Region Moderate depletion  Clavicle Bone Region Mild depletion  Clavicle and Acromion Bone Region Mild depletion  Scapular Bone Region Unable to assess  Dorsal Hand Unable to assess  [mittens]  Patellar Region Severe depletion  Anterior Thigh Region Severe depletion  Posterior Calf Region Severe depletion  Edema (RD Assessment) None  Hair Reviewed  Eyes Unable to assess  Mouth Reviewed  Skin Reviewed  Nails Unable to assess    Diet Order:   Diet Order             Diet NPO time specified  Diet effective now  EDUCATION NEEDS:   Not appropriate for education at this time  Skin:  Skin Assessment: Reviewed RN Assessment Per WOC: Upper sacrum/buttocks with red moist macerated skin and patchy areas have evolved into Stage 2 wounds which are pink and moist.  Affected area is approx 15X12X.1cm.  Left perineum skin fold also with the same appearance; 2X.5X.1cm pink moist partial thickness fissure related to moisture.   Last BM:  9/15 - type 6  Height:   Ht Readings from Last 1 Encounters:  09/11/22 5' 11.75" (1.822 m)    Weight:    Wt Readings from Last 1 Encounters:  09/11/22 82.2 kg    Ideal Body Weight:  80.9 kg  BMI:  Body mass index is 24.75 kg/m.  Estimated Nutritional Needs:  Kcal:  2200-2400 kcal/d Protein:  115-130g/d Fluid:  2.2-2.4L/d    Greig Castilla, RD, LDN Clinical Dietitian RD pager # available in [facility]  After hours/weekend pager # available in [facility]
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
## 2022-09-11 NOTE — Progress Notes (Signed)
Patient seen today by trach team for consult.  No education is needed at this time.  All necessary equipment is at beside.   Will continue to follow for progression.  

## 2022-09-12 ENCOUNTER — Inpatient Hospital Stay (HOSPITAL_COMMUNITY): Payer: No Typology Code available for payment source

## 2022-09-12 DIAGNOSIS — R6521 Severe sepsis with septic shock: Secondary | ICD-10-CM | POA: Diagnosis not present

## 2022-09-12 DIAGNOSIS — A419 Sepsis, unspecified organism: Secondary | ICD-10-CM | POA: Diagnosis not present

## 2022-09-12 DIAGNOSIS — E440 Moderate protein-calorie malnutrition: Secondary | ICD-10-CM | POA: Insufficient documentation

## 2022-09-12 LAB — BASIC METABOLIC PANEL
Anion gap: 9 (ref 5–15)
Anion gap: 11 (ref 5–15)
BUN: 11 mg/dL (ref 8–23)
BUN: 16 mg/dL (ref 8–23)
CO2: 24 mmol/L (ref 22–32)
CO2: 25 mmol/L (ref 22–32)
Calcium: 8.6 mg/dL — ABNORMAL LOW (ref 8.9–10.3)
Calcium: 8.7 mg/dL — ABNORMAL LOW (ref 8.9–10.3)
Chloride: 100 mmol/L (ref 98–111)
Chloride: 96 mmol/L — ABNORMAL LOW (ref 98–111)
Creatinine, Ser: 0.57 mg/dL — ABNORMAL LOW (ref 0.61–1.24)
Creatinine, Ser: 0.51 mg/dL — ABNORMAL LOW (ref 0.61–1.24)
GFR, Estimated: >60 mL/min (ref >60)
GFR, Estimated: >60 mL/min (ref >60)
Glucose, Bld: 332 mg/dL — ABNORMAL HIGH (ref 70–99)
Glucose, Bld: 297 mg/dL — ABNORMAL HIGH (ref 70–99)
Potassium: 3.7 mmol/L (ref 3.5–5.1)
Potassium: 3.3 mmol/L — ABNORMAL LOW (ref 3.5–5.1)
Sodium: 133 mmol/L — ABNORMAL LOW (ref 135–145)
Sodium: 132 mmol/L — ABNORMAL LOW (ref 135–145)

## 2022-09-12 LAB — GLUCOSE, CAPILLARY
Glucose-Capillary: 241 mg/dL — ABNORMAL HIGH (ref 70–99)
Glucose-Capillary: 273 mg/dL — ABNORMAL HIGH (ref 70–99)
Glucose-Capillary: 288 mg/dL — ABNORMAL HIGH (ref 70–99)
Glucose-Capillary: 246 mg/dL — ABNORMAL HIGH (ref 70–99)
Glucose-Capillary: 280 mg/dL — ABNORMAL HIGH (ref 70–99)
Glucose-Capillary: 262 mg/dL — ABNORMAL HIGH (ref 70–99)

## 2022-09-12 LAB — COMPREHENSIVE METABOLIC PANEL
ALT: 39 U/L (ref 0–44)
AST: 26 U/L (ref 15–41)
Albumin: 2.2 g/dL — ABNORMAL LOW (ref 3.5–5.0)
Alkaline Phosphatase: 101 U/L (ref 38–126)
Anion gap: 11 (ref 5–15)
BUN: 13 mg/dL (ref 8–23)
CO2: 23 mmol/L (ref 22–32)
Calcium: 9 mg/dL (ref 8.9–10.3)
Chloride: 100 mmol/L (ref 98–111)
Creatinine, Ser: 0.59 mg/dL — ABNORMAL LOW (ref 0.61–1.24)
GFR, Estimated: >60 mL/min (ref >60)
Glucose, Bld: 259 mg/dL — ABNORMAL HIGH (ref 70–99)
Potassium: 3.7 mmol/L (ref 3.5–5.1)
Sodium: 134 mmol/L — ABNORMAL LOW (ref 135–145)
Total Bilirubin: 1 mg/dL (ref 0.3–1.2)
Total Protein: 5.3 g/dL — ABNORMAL LOW (ref 6.5–8.1)

## 2022-09-12 LAB — LACTIC ACID, PLASMA: Lactic Acid, Venous: 2.7 mmol/L (ref 0.5–1.9)

## 2022-09-12 LAB — PHOSPHORUS
Phosphorus: 1 mg/dL — CL (ref 2.5–4.6)
Phosphorus: <1 mg/dL — CL (ref 2.5–4.6)
Phosphorus: 1.5 mg/dL — ABNORMAL LOW (ref 2.5–4.6)

## 2022-09-12 LAB — CBC
HCT: 36.5 % — ABNORMAL LOW (ref 39.0–52.0)
Hemoglobin: 11.8 g/dL — ABNORMAL LOW (ref 13.0–17.0)
MCH: 28.2 pg (ref 26.0–34.0)
MCHC: 32.3 g/dL (ref 30.0–36.0)
MCV: 87.1 fL (ref 80.0–100.0)
Platelets: 207 10*3/uL (ref 150–400)
RBC: 4.19 MIL/uL — ABNORMAL LOW (ref 4.22–5.81)
RDW: 16 % — ABNORMAL HIGH (ref 11.5–15.5)
WBC: 8.5 10*3/uL (ref 4.0–10.5)
nRBC: 0 % (ref 0.0–0.2)

## 2022-09-12 LAB — CORTISOL: Cortisol, Plasma: 24.5 ug/dL

## 2022-09-12 LAB — MAGNESIUM
Magnesium: 1.4 mg/dL — ABNORMAL LOW (ref 1.7–2.4)
Magnesium: 1.9 mg/dL (ref 1.7–2.4)

## 2022-09-12 MED ORDER — INSULIN GLARGINE-YFGN 100 UNIT/ML ~~LOC~~ SOLN
5.0000 [IU] | Freq: Once | SUBCUTANEOUS | Status: AC
  Administered 2022-09-12: 5 [IU] via SUBCUTANEOUS
  Filled 2022-09-12: qty 0.05

## 2022-09-12 MED ORDER — INSULIN ASPART 100 UNIT/ML IJ SOLN
4.0000 [IU] | INTRAMUSCULAR | Status: DC
  Administered 2022-09-12 – 2022-09-13 (×6): 4 [IU] via SUBCUTANEOUS

## 2022-09-12 MED ORDER — POTASSIUM PHOSPHATES 15 MMOLE/5ML IV SOLN
45.0000 mmol | Freq: Once | INTRAVENOUS | Status: AC
  Administered 2022-09-12: 45 mmol via INTRAVENOUS
  Filled 2022-09-12: qty 15

## 2022-09-12 MED ORDER — ORAL CARE MOUTH RINSE: 15.0000 mL | OROMUCOSAL | Status: DC

## 2022-09-12 MED ORDER — MAGNESIUM SULFATE 4 GM/100ML IV SOLN
4.0000 g | Freq: Once | INTRAVENOUS | Status: AC
  Administered 2022-09-12: 4 g via INTRAVENOUS
  Filled 2022-09-12: qty 100

## 2022-09-12 MED ORDER — WHITE PETROLATUM EX OINT: TOPICAL_OINTMENT | CUTANEOUS | Status: DC | PRN

## 2022-09-12 MED ORDER — ORAL CARE MOUTH RINSE: 15.0000 mL | OROMUCOSAL | Status: DC | PRN

## 2022-09-12 MED ORDER — PANTOPRAZOLE 2 MG/ML SUSPENSION
40.0000 mg | Freq: Every day | ORAL | Status: DC
  Administered 2022-09-12 – 2022-09-25 (×14): 40 mg
  Filled 2022-09-12 (×14): qty 20

## 2022-09-12 MED ORDER — ORAL CARE MOUTH RINSE
15.0000 mL | OROMUCOSAL | Status: DC
  Administered 2022-09-12 – 2022-09-25 (×52): 15 mL via OROMUCOSAL

## 2022-09-12 MED ORDER — LEVOTHYROXINE SODIUM 100 MCG PO TABS
100.0000 ug | ORAL_TABLET | Freq: Every day | ORAL | Status: DC
  Administered 2022-09-13 – 2022-09-25 (×13): 100 ug
  Filled 2022-09-12 (×13): qty 1

## 2022-09-12 MED ORDER — ENOXAPARIN SODIUM 40 MG/0.4ML IJ SOSY
40.0000 mg | PREFILLED_SYRINGE | Freq: Every day | INTRAMUSCULAR | Status: DC
  Administered 2022-09-12 – 2022-09-25 (×14): 40 mg via SUBCUTANEOUS
  Filled 2022-09-12 (×14): qty 0.4

## 2022-09-12 MED ORDER — POTASSIUM PHOSPHATES 15 MMOLE/5ML IV SOLN
30.0000 mmol | Freq: Once | INTRAVENOUS | Status: AC
  Administered 2022-09-12: 30 mmol via INTRAVENOUS
  Filled 2022-09-12: qty 10

## 2022-09-12 MED ORDER — LIOTHYRONINE SODIUM 5 MCG PO TABS
7.5000 ug | ORAL_TABLET | Freq: Two times a day (BID) | ORAL | Status: DC
  Administered 2022-09-12 – 2022-09-25 (×25): 7.5 ug
  Filled 2022-09-12 (×30): qty 2

## 2022-09-12 MED ORDER — INSULIN GLARGINE-YFGN 100 UNIT/ML ~~LOC~~ SOLN
15.0000 [IU] | Freq: Every day | SUBCUTANEOUS | Status: DC
  Administered 2022-09-13: 15 [IU] via SUBCUTANEOUS
  Filled 2022-09-12: qty 0.15

## 2022-09-12 NOTE — Progress Notes (Signed)
[facility] Progress Note Patient Name: Joshua Haley DOB: [birth year] MRN: [id number]   Date of Service  09/12/2022  HPI/Events of Note  Hypokalemia  Hypophosphatemia - K+ = 3.7, PO4--- = < 1.0. and Creatinine = 0.59.  eICU Interventions  Plan: Will replace K+ and PO4---. Repeat BMP and phosphorus at 5 PM.     Intervention Category Major Interventions: Electrolyte abnormality - evaluation and management  Lysle Dingwall 09/12/2022, 5:53 AM

## 2022-09-12 NOTE — Progress Notes (Signed)
Date and time results received: 09/12/22   Test: Phos  Critical Value: 1  Name of Provider Notified: John. E-link RN  Orders Received? Waiting for orders
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
## 2022-09-12 NOTE — Progress Notes (Signed)
Date and time results received: 09/12/22 4:37 PM  Test: K Critical Value: < 1.0  Name of Provider Notified: Dr. Riesa Pope  Orders Received? Or Actions Taken?:  See MAR

## 2022-09-13 DIAGNOSIS — A419 Sepsis, unspecified organism: Secondary | ICD-10-CM | POA: Diagnosis not present

## 2022-09-13 DIAGNOSIS — R6521 Severe sepsis with septic shock: Secondary | ICD-10-CM | POA: Diagnosis not present

## 2022-09-13 LAB — CBC
HCT: 30.7 % — ABNORMAL LOW (ref 39.0–52.0)
Hemoglobin: 10.2 g/dL — ABNORMAL LOW (ref 13.0–17.0)
MCH: 28.4 pg (ref 26.0–34.0)
MCHC: 33.2 g/dL (ref 30.0–36.0)
MCV: 85.5 fL (ref 80.0–100.0)
Platelets: 147 10*3/uL — ABNORMAL LOW (ref 150–400)
RBC: 3.59 MIL/uL — ABNORMAL LOW (ref 4.22–5.81)
RDW: 16 % — ABNORMAL HIGH (ref 11.5–15.5)
WBC: 7.7 10*3/uL (ref 4.0–10.5)
nRBC: 0 % (ref 0.0–0.2)

## 2022-09-13 LAB — BASIC METABOLIC PANEL
Anion gap: 9 (ref 5–15)
Anion gap: 8 (ref 5–15)
BUN: 18 mg/dL (ref 8–23)
BUN: 15 mg/dL (ref 8–23)
CO2: 24 mmol/L (ref 22–32)
CO2: 24 mmol/L (ref 22–32)
Calcium: 8.1 mg/dL — ABNORMAL LOW (ref 8.9–10.3)
Calcium: 8.2 mg/dL — ABNORMAL LOW (ref 8.9–10.3)
Chloride: 98 mmol/L (ref 98–111)
Chloride: 100 mmol/L (ref 98–111)
Creatinine, Ser: 0.4 mg/dL — ABNORMAL LOW (ref 0.61–1.24)
Creatinine, Ser: 0.4 mg/dL — ABNORMAL LOW (ref 0.61–1.24)
GFR, Estimated: >60 mL/min (ref >60)
GFR, Estimated: >60 mL/min (ref >60)
Glucose, Bld: 271 mg/dL — ABNORMAL HIGH (ref 70–99)
Glucose, Bld: 278 mg/dL — ABNORMAL HIGH (ref 70–99)
Potassium: 3.5 mmol/L (ref 3.5–5.1)
Potassium: 3.8 mmol/L (ref 3.5–5.1)
Sodium: 131 mmol/L — ABNORMAL LOW (ref 135–145)
Sodium: 132 mmol/L — ABNORMAL LOW (ref 135–145)

## 2022-09-13 LAB — URINE CULTURE: Culture: >=100000 — AB

## 2022-09-13 LAB — GLUCOSE, CAPILLARY
Glucose-Capillary: 248 mg/dL — ABNORMAL HIGH (ref 70–99)
Glucose-Capillary: 256 mg/dL — ABNORMAL HIGH (ref 70–99)
Glucose-Capillary: 301 mg/dL — ABNORMAL HIGH (ref 70–99)
Glucose-Capillary: 245 mg/dL — ABNORMAL HIGH (ref 70–99)
Glucose-Capillary: 213 mg/dL — ABNORMAL HIGH (ref 70–99)
Glucose-Capillary: 189 mg/dL — ABNORMAL HIGH (ref 70–99)

## 2022-09-13 LAB — PHOSPHORUS
Phosphorus: 1.8 mg/dL — ABNORMAL LOW (ref 2.5–4.6)
Phosphorus: 2.5 mg/dL (ref 2.5–4.6)
Phosphorus: 1.9 mg/dL — ABNORMAL LOW (ref 2.5–4.6)

## 2022-09-13 LAB — MAGNESIUM
Magnesium: 1.4 mg/dL — ABNORMAL LOW (ref 1.7–2.4)
Magnesium: 2 mg/dL (ref 1.7–2.4)
Magnesium: 1.7 mg/dL (ref 1.7–2.4)

## 2022-09-13 LAB — CULTURE, BLOOD (SINGLE): Special Requests: ADEQUATE

## 2022-09-13 MED ORDER — MAGNESIUM SULFATE 4 GM/100ML IV SOLN
4.0000 g | Freq: Once | INTRAVENOUS | Status: AC
  Administered 2022-09-13: 4 g via INTRAVENOUS
  Filled 2022-09-13: qty 100

## 2022-09-13 MED ORDER — INSULIN ASPART 100 UNIT/ML IJ SOLN
0.0000 [IU] | INTRAMUSCULAR | Status: DC
  Administered 2022-09-13: 7 [IU] via SUBCUTANEOUS
  Administered 2022-09-13: 4 [IU] via SUBCUTANEOUS
  Administered 2022-09-13: 15 [IU] via SUBCUTANEOUS
  Administered 2022-09-13: 7 [IU] via SUBCUTANEOUS
  Administered 2022-09-14 (×3): 4 [IU] via SUBCUTANEOUS
  Administered 2022-09-14: 3 [IU] via SUBCUTANEOUS
  Administered 2022-09-14: 7 [IU] via SUBCUTANEOUS
  Administered 2022-09-14: 3 [IU] via SUBCUTANEOUS
  Administered 2022-09-15 (×2): 4 [IU] via SUBCUTANEOUS
  Administered 2022-09-15: 7 [IU] via SUBCUTANEOUS
  Administered 2022-09-15: 4 [IU] via SUBCUTANEOUS
  Administered 2022-09-15 (×2): 3 [IU] via SUBCUTANEOUS
  Administered 2022-09-16: 4 [IU] via SUBCUTANEOUS
  Administered 2022-09-16: 7 [IU] via SUBCUTANEOUS
  Administered 2022-09-16: 4 [IU] via SUBCUTANEOUS
  Administered 2022-09-16 (×2): 7 [IU] via SUBCUTANEOUS
  Administered 2022-09-17: 4 [IU] via SUBCUTANEOUS
  Administered 2022-09-17 (×2): 3 [IU] via SUBCUTANEOUS
  Administered 2022-09-17: 7 [IU] via SUBCUTANEOUS
  Administered 2022-09-17: 4 [IU] via SUBCUTANEOUS
  Administered 2022-09-17: 7 [IU] via SUBCUTANEOUS
  Administered 2022-09-18 (×2): 3 [IU] via SUBCUTANEOUS
  Administered 2022-09-18 (×2): 7 [IU] via SUBCUTANEOUS
  Administered 2022-09-18: 4 [IU] via SUBCUTANEOUS
  Administered 2022-09-19: 3 [IU] via SUBCUTANEOUS
  Administered 2022-09-19: 4 [IU] via SUBCUTANEOUS
  Administered 2022-09-19: 3 [IU] via SUBCUTANEOUS
  Administered 2022-09-19: 4 [IU] via SUBCUTANEOUS
  Administered 2022-09-19 – 2022-09-20 (×2): 3 [IU] via SUBCUTANEOUS
  Administered 2022-09-20: 4 [IU] via SUBCUTANEOUS
  Administered 2022-09-20 (×2): 3 [IU] via SUBCUTANEOUS
  Administered 2022-09-21 (×2): 4 [IU] via SUBCUTANEOUS
  Administered 2022-09-21: 3 [IU] via SUBCUTANEOUS
  Administered 2022-09-21: 4 [IU] via SUBCUTANEOUS
  Administered 2022-09-22 (×3): 3 [IU] via SUBCUTANEOUS
  Administered 2022-09-22 (×3): 4 [IU] via SUBCUTANEOUS
  Administered 2022-09-23 (×3): 3 [IU] via SUBCUTANEOUS
  Administered 2022-09-23: 4 [IU] via SUBCUTANEOUS
  Administered 2022-09-24 – 2022-09-25 (×7): 3 [IU] via SUBCUTANEOUS

## 2022-09-13 MED ORDER — MAGNESIUM SULFATE 2 GM/50ML IV SOLN
2.0000 g | Freq: Once | INTRAVENOUS | Status: AC
  Administered 2022-09-13: 2 g via INTRAVENOUS
  Filled 2022-09-13: qty 50

## 2022-09-13 MED ORDER — POTASSIUM PHOSPHATES 15 MMOLE/5ML IV SOLN
30.0000 mmol | Freq: Once | INTRAVENOUS | Status: AC
  Administered 2022-09-13: 30 mmol via INTRAVENOUS
  Filled 2022-09-13: qty 10

## 2022-09-13 MED ORDER — SERTRALINE HCL 25 MG PO TABS
25.0000 mg | ORAL_TABLET | Freq: Every day | ORAL | Status: DC
  Administered 2022-09-13 – 2022-09-25 (×13): 25 mg
  Filled 2022-09-13 (×14): qty 1

## 2022-09-13 MED ORDER — INSULIN GLARGINE-YFGN 100 UNIT/ML ~~LOC~~ SOLN
25.0000 [IU] | Freq: Every day | SUBCUTANEOUS | Status: DC
  Administered 2022-09-13 – 2022-09-16 (×4): 25 [IU] via SUBCUTANEOUS
  Filled 2022-09-13 (×5): qty 0.25

## 2022-09-13 MED ORDER — INSULIN ASPART 100 UNIT/ML IJ SOLN
8.0000 [IU] | INTRAMUSCULAR | Status: DC
  Administered 2022-09-13 – 2022-09-25 (×69): 8 [IU] via SUBCUTANEOUS

## 2022-09-13 NOTE — Progress Notes (Signed)
[facility] ADULT ICU REPLACEMENT PROTOCOL   The patient does apply for the [facility] Adult ICU Electrolyte Replacment Protocol based on the criteria listed below:   1.Exclusion criteria: TCTS patients, ECMO patients, and Dialysis patients 2. Is GFR >/= 30 ml/min? Yes.    Patient's GFR today is > 60 3. Is SCr </= 2? Yes.   Patient's SCr is 0.40 mg/dL 4. Did SCr increase >/= 0.5 in 24 hours? No. 5.Pt's weight >40kg  Yes.   6. Abnormal electrolyte(s): K+ 3.5, Phos 1.8 and Mag 1.4  7. Electrolytes replaced per protocol 8.  Call MD STAT for K+ </= 2.5, Phos </= 1, or Mag </= 1 Physician:  Dr Gavin Potters, Elfredia Nevins 09/13/2022 5:52 AM
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
## 2022-09-14 DIAGNOSIS — A419 Sepsis, unspecified organism: Secondary | ICD-10-CM | POA: Diagnosis not present

## 2022-09-14 DIAGNOSIS — G9340 Encephalopathy, unspecified: Secondary | ICD-10-CM | POA: Diagnosis not present

## 2022-09-14 DIAGNOSIS — J690 Pneumonitis due to inhalation of food and vomit: Secondary | ICD-10-CM | POA: Diagnosis not present

## 2022-09-14 DIAGNOSIS — J9621 Acute and chronic respiratory failure with hypoxia: Secondary | ICD-10-CM | POA: Diagnosis not present

## 2022-09-14 LAB — CBC
HCT: 34.4 % — ABNORMAL LOW (ref 39.0–52.0)
Hemoglobin: 11.3 g/dL — ABNORMAL LOW (ref 13.0–17.0)
MCH: 28.4 pg (ref 26.0–34.0)
MCHC: 32.8 g/dL (ref 30.0–36.0)
MCV: 86.4 fL (ref 80.0–100.0)
Platelets: 169 10*3/uL (ref 150–400)
RBC: 3.98 MIL/uL — ABNORMAL LOW (ref 4.22–5.81)
RDW: 16.4 % — ABNORMAL HIGH (ref 11.5–15.5)
WBC: 8.4 10*3/uL (ref 4.0–10.5)
nRBC: 0 % (ref 0.0–0.2)

## 2022-09-14 LAB — COMPREHENSIVE METABOLIC PANEL
ALT: 20 U/L (ref 0–44)
AST: 12 U/L — ABNORMAL LOW (ref 15–41)
Albumin: 1.9 g/dL — ABNORMAL LOW (ref 3.5–5.0)
Alkaline Phosphatase: 98 U/L (ref 38–126)
Anion gap: 10 (ref 5–15)
BUN: 14 mg/dL (ref 8–23)
CO2: 27 mmol/L (ref 22–32)
Calcium: 8.7 mg/dL — ABNORMAL LOW (ref 8.9–10.3)
Chloride: 103 mmol/L (ref 98–111)
Creatinine, Ser: 0.44 mg/dL — ABNORMAL LOW (ref 0.61–1.24)
GFR, Estimated: >60 mL/min (ref >60)
Glucose, Bld: 148 mg/dL — ABNORMAL HIGH (ref 70–99)
Potassium: 4 mmol/L (ref 3.5–5.1)
Sodium: 140 mmol/L (ref 135–145)
Total Bilirubin: 0.6 mg/dL (ref 0.3–1.2)
Total Protein: 5.4 g/dL — ABNORMAL LOW (ref 6.5–8.1)

## 2022-09-14 LAB — GLUCOSE, CAPILLARY
Glucose-Capillary: 122 mg/dL — ABNORMAL HIGH (ref 70–99)
Glucose-Capillary: 164 mg/dL — ABNORMAL HIGH (ref 70–99)
Glucose-Capillary: 149 mg/dL — ABNORMAL HIGH (ref 70–99)
Glucose-Capillary: 188 mg/dL — ABNORMAL HIGH (ref 70–99)
Glucose-Capillary: 227 mg/dL — ABNORMAL HIGH (ref 70–99)
Glucose-Capillary: 151 mg/dL — ABNORMAL HIGH (ref 70–99)

## 2022-09-14 MED ORDER — MAGNESIUM SULFATE 2 GM/50ML IV SOLN
2.0000 g | Freq: Once | INTRAVENOUS | Status: AC
  Administered 2022-09-14: 2 g via INTRAVENOUS
  Filled 2022-09-14: qty 50

## 2022-09-14 MED ORDER — ACETAMINOPHEN 160 MG/5ML PO SOLN
650.0000 mg | Freq: Four times a day (QID) | ORAL | Status: DC | PRN
  Administered 2022-09-14 – 2022-09-17 (×5): 650 mg
  Filled 2022-09-14 (×5): qty 20.3

## 2022-09-14 NOTE — Progress Notes (Signed)
Pharmacy Electrolyte Replacement  Recent Labs:  Recent Labs    09/13/22 2003 09/14/22 0429  K  --  4.0  MG 1.7  --   PHOS 1.9*  --   CREATININE  --  0.44*    Low Critical Values (K </= 2.5, Phos </= 1, Mg </= 1) Present: None  Plan: Mag 2g x 1 per protocol. Added phos to AM levels on 9/19 since required replacement overnight.
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
## 2022-09-15 DIAGNOSIS — A419 Sepsis, unspecified organism: Secondary | ICD-10-CM | POA: Diagnosis not present

## 2022-09-15 DIAGNOSIS — J9621 Acute and chronic respiratory failure with hypoxia: Secondary | ICD-10-CM | POA: Diagnosis not present

## 2022-09-15 DIAGNOSIS — J690 Pneumonitis due to inhalation of food and vomit: Secondary | ICD-10-CM | POA: Diagnosis not present

## 2022-09-15 DIAGNOSIS — G9340 Encephalopathy, unspecified: Secondary | ICD-10-CM | POA: Diagnosis not present

## 2022-09-15 LAB — GLUCOSE, CAPILLARY
Glucose-Capillary: 131 mg/dL — ABNORMAL HIGH (ref 70–99)
Glucose-Capillary: 146 mg/dL — ABNORMAL HIGH (ref 70–99)
Glucose-Capillary: 184 mg/dL — ABNORMAL HIGH (ref 70–99)
Glucose-Capillary: 187 mg/dL — ABNORMAL HIGH (ref 70–99)
Glucose-Capillary: 193 mg/dL — ABNORMAL HIGH (ref 70–99)
Glucose-Capillary: 222 mg/dL — ABNORMAL HIGH (ref 70–99)

## 2022-09-15 LAB — CBC
HCT: 37.4 % — ABNORMAL LOW (ref 39.0–52.0)
Hemoglobin: 12.1 g/dL — ABNORMAL LOW (ref 13.0–17.0)
MCH: 28.3 pg (ref 26.0–34.0)
MCHC: 32.4 g/dL (ref 30.0–36.0)
MCV: 87.4 fL (ref 80.0–100.0)
Platelets: 177 10*3/uL (ref 150–400)
RBC: 4.28 MIL/uL (ref 4.22–5.81)
RDW: 16.5 % — ABNORMAL HIGH (ref 11.5–15.5)
WBC: 7.3 10*3/uL (ref 4.0–10.5)
nRBC: 0 % (ref 0.0–0.2)

## 2022-09-15 LAB — RENAL FUNCTION PANEL
Albumin: 1.9 g/dL — ABNORMAL LOW (ref 3.5–5.0)
Anion gap: 8 (ref 5–15)
BUN: 17 mg/dL (ref 8–23)
CO2: 25 mmol/L (ref 22–32)
Calcium: 9.1 mg/dL (ref 8.9–10.3)
Chloride: 106 mmol/L (ref 98–111)
Creatinine, Ser: 0.4 mg/dL — ABNORMAL LOW (ref 0.61–1.24)
GFR, Estimated: >60 mL/min (ref >60)
Glucose, Bld: 143 mg/dL — ABNORMAL HIGH (ref 70–99)
Phosphorus: 2.8 mg/dL (ref 2.5–4.6)
Potassium: 3.8 mmol/L (ref 3.5–5.1)
Sodium: 139 mmol/L (ref 135–145)

## 2022-09-15 LAB — PHOSPHORUS: Phosphorus: 2.8 mg/dL (ref 2.5–4.6)

## 2022-09-15 NOTE — TOC Progression Note (Signed)
Transition of Care [facility]) - Initial/Assessment Note    Patient Details  Name: Joshua Haley MRN: [id number] Date of Birth: 1951/02/25  [facility] CM/SW Contact:    Milinda Antis, [place] Phone Number: 09/15/2022, 12:55 PM  Clinical Narrative:                 Patient is from [facility] and can return when medically ready.    TOC will continue to follow.   Patient Goals and CMS Choice        Expected Discharge Plan and Services                                                Prior Living Arrangements/Services                       Activities of Daily Living      Permission Sought/Granted                  Emotional Assessment              Admission diagnosis:  Shock ([place]) [R57.9] Gross hematuria [R31.0] Septic shock ([place]) [A41.9, R65.21] Patient Active Problem List   Diagnosis Date Noted   Malnutrition of moderate degree 09/12/2022   Urethral bleeding 09/11/2022   Hyperglycemia 09/11/2022   Acute encephalopathy 09/11/2022   Sacral decubitus ulcer 09/11/2022   CAD (coronary artery disease) 09/11/2022   Septic shock ([place]) 09/10/2022   Gross hematuria    Aspiration into airway    Chronic respiratory failure with hypoxia (HCC)    Acute on chronic respiratory failure with hypoxia ([place]) 06/21/2022   Seizure disorder ([place]) 06/21/2022   Aspiration pneumonia ([place]) 06/21/2022   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 [place] [address] Phone: [phone] Fax: [phone]     Social Determinants of  Health (SDOH) Interventions    Readmission Risk Interventions     No data to display

## 2022-09-15 NOTE — Progress Notes (Signed)
Joshua Haley transferred to [id number]. Responds to localized pain, and voice. RT called. Connected to telemetry. Skin assessed with Jenny Reichmann. Bed in lowest position, call light within reach.
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
## 2022-09-15 NOTE — Progress Notes (Signed)
Informed wife of transfer and bed on 5w

## 2022-09-16 ENCOUNTER — Inpatient Hospital Stay (HOSPITAL_COMMUNITY): Payer: No Typology Code available for payment source

## 2022-09-16 DIAGNOSIS — G9340 Encephalopathy, unspecified: Secondary | ICD-10-CM | POA: Diagnosis not present

## 2022-09-16 DIAGNOSIS — G40909 Epilepsy, unspecified, not intractable, without status epilepticus: Secondary | ICD-10-CM | POA: Diagnosis not present

## 2022-09-16 DIAGNOSIS — A419 Sepsis, unspecified organism: Secondary | ICD-10-CM | POA: Diagnosis not present

## 2022-09-16 DIAGNOSIS — R6521 Severe sepsis with septic shock: Secondary | ICD-10-CM | POA: Diagnosis not present

## 2022-09-16 DIAGNOSIS — R4182 Altered mental status, unspecified: Secondary | ICD-10-CM

## 2022-09-16 DIAGNOSIS — J9621 Acute and chronic respiratory failure with hypoxia: Secondary | ICD-10-CM | POA: Diagnosis not present

## 2022-09-16 LAB — CBC
HCT: 37.6 % — ABNORMAL LOW (ref 39.0–52.0)
Hemoglobin: 12.4 g/dL — ABNORMAL LOW (ref 13.0–17.0)
MCH: 28.2 pg (ref 26.0–34.0)
MCHC: 33 g/dL (ref 30.0–36.0)
MCV: 85.5 fL (ref 80.0–100.0)
Platelets: 222 10*3/uL (ref 150–400)
RBC: 4.4 MIL/uL (ref 4.22–5.81)
RDW: 16.6 % — ABNORMAL HIGH (ref 11.5–15.5)
WBC: 8.6 10*3/uL (ref 4.0–10.5)
nRBC: 0 % (ref 0.0–0.2)

## 2022-09-16 LAB — GLUCOSE, CAPILLARY
Glucose-Capillary: 137 mg/dL — ABNORMAL HIGH (ref 70–99)
Glucose-Capillary: 178 mg/dL — ABNORMAL HIGH (ref 70–99)
Glucose-Capillary: 181 mg/dL — ABNORMAL HIGH (ref 70–99)
Glucose-Capillary: 207 mg/dL — ABNORMAL HIGH (ref 70–99)
Glucose-Capillary: 207 mg/dL — ABNORMAL HIGH (ref 70–99)
Glucose-Capillary: 242 mg/dL — ABNORMAL HIGH (ref 70–99)

## 2022-09-16 LAB — BASIC METABOLIC PANEL
Anion gap: 13 (ref 5–15)
BUN: 21 mg/dL (ref 8–23)
CO2: 23 mmol/L (ref 22–32)
Calcium: 9.5 mg/dL (ref 8.9–10.3)
Chloride: 103 mmol/L (ref 98–111)
Creatinine, Ser: 0.44 mg/dL — ABNORMAL LOW (ref 0.61–1.24)
GFR, Estimated: >60 mL/min (ref >60)
Glucose, Bld: 218 mg/dL — ABNORMAL HIGH (ref 70–99)
Potassium: 4.3 mmol/L (ref 3.5–5.1)
Sodium: 139 mmol/L (ref 135–145)

## 2022-09-16 LAB — CULTURE, BLOOD (SINGLE)
Culture: NO GROWTH
Special Requests: ADEQUATE

## 2022-09-16 LAB — PROCALCITONIN: Procalcitonin: 2.57 ng/mL

## 2022-09-16 LAB — PHOSPHORUS: Phosphorus: 3.3 mg/dL (ref 2.5–4.6)

## 2022-09-16 LAB — MAGNESIUM: Magnesium: 1.6 mg/dL — ABNORMAL LOW (ref 1.7–2.4)

## 2022-09-16 MED ORDER — MAGNESIUM SULFATE 2 GM/50ML IV SOLN
2.0000 g | Freq: Once | INTRAVENOUS | Status: AC
  Administered 2022-09-16: 2 g via INTRAVENOUS
  Filled 2022-09-16: qty 50

## 2022-09-16 NOTE — TOC Progression Note (Signed)
Transition of Care [facility]) - Progression Note    Patient Details  Name: Joshua Haley MRN: [id number] Date of Birth: July 09, 1951  Transition of Care [facility]) CM/SW [place], LCSW Phone Number: 09/16/2022, 2:04 PM  Clinical Narrative:    CSW received call from [place] with [place] [facility] for updates on patient's discharge.    Expected Discharge Plan: [place] Barriers to Discharge: Continued Medical Work up  Expected Discharge Plan and Services Expected Discharge Plan: [place] In-house Referral: Clinical Social Work   Post Acute Care Choice: [place] Living arrangements for the past 2 months: [facility] ([facility])                                       Social Determinants of Health (SDOH) Interventions    Readmission Risk Interventions     No data to display

## 2022-09-16 NOTE — Plan of Care (Signed)
  Problem: Clinical Measurements: Goal: Cardiovascular complication will be avoided Outcome: Progressing   Problem: Nutrition: Goal: Adequate nutrition will be maintained Outcome: Progressing   Problem: Coping: Goal: Level of anxiety will decrease Outcome: Progressing   

## 2022-09-16 NOTE — Progress Notes (Signed)
EEG complete - results pending 

## 2022-09-16 NOTE — Procedures (Signed)
Patient Name: Joshua Haley  MRN: [id number]  Epilepsy Attending: Lora Havens  Referring Physician/Provider: Albertine Patricia, MD  Date: 09/16/2022 Duration: 23.32 mins  Patient history: 71yo m with ams. EEG to evaluate for seizure  Level of alertness:  lethargic   AEDs during EEG study: LEV  Technical aspects: This EEG study was done with scalp electrodes positioned according to the 10-20 International system of electrode placement. Electrical activity was reviewed with band pass filter of 1-70Hz , sensitivity of 7 uV/mm, display speed of 39mm/sec with a 60Hz  notched filter applied as appropriate. EEG data were recorded continuously and digitally stored.  Video monitoring was available and reviewed as appropriate.  Description: No posterior dominant rhythm was seen. EEG showed continuous generalized and maximal right temporal region 3 to 6 Hz theta-delta slowing. Sharp transient were noted in right temporal region.  Hyperventilation and photic stimulation were not performed.      ABNORMALITY - Continuous slow, generalized and maximal right temporal region - Breach artifact, right temporal region   IMPRESSION: This study is suggestive of cortical dysfunction arising from right temporal region likely secondary to underlying craniotomy. Additionally there is moderate to severe diffuse encephalopathy, nonspecific etiology. No seizures or definite epileptiform discharges were seen throughout the recording.  [name] Barbra Sarks

## 2022-09-16 NOTE — Inpatient Diabetes Management (Signed)
Inpatient Diabetes Program Recommendations  AACE/ADA: New Consensus Statement on Inpatient Glycemic Control   Target Ranges:  Prepandial:   less than 140 mg/dL      Peak postprandial:   less than 180 mg/dL (1-2 hours)      Critically ill patients:  140 - 180 mg/dL    Latest Reference Range & Units 09/16/22 03:27 09/16/22 07:54 09/16/22 12:25  Glucose-Capillary 70 - 99 mg/dL 178 (H) 242 (H) 207 (H)    Latest Reference Range & Units 09/15/22 07:39 09/15/22 11:08 09/15/22 15:15 09/15/22 19:38 09/15/22 23:32  Glucose-Capillary 70 - 99 mg/dL 131 (H) 187 (H) 146 (H) 193 (H) 222 (H)   Review of Glycemic Control  Diabetes history: DM2 Outpatient Diabetes medications: Lantus 15 units daily (taking 8 units daily),Regular 12 units Q6H; Glucerna 60 ml/hr Current orders for Inpatient glycemic control: Semglee 25 units daily, Novolog 0-20 units Q4H, Novolog 8 units Q4H; Vital @ 60 ml/hr  Inpatient Diabetes Program Recommendations:    Insulin: Please consider increasing Semglee to 30  units daily.  Thanks, Barnie Alderman, RN, MSN, [place] Diabetes Coordinator Inpatient Diabetes Program [phone] (Team Pager from 8am to [place])
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
## 2022-09-17 ENCOUNTER — Inpatient Hospital Stay (HOSPITAL_COMMUNITY): Payer: No Typology Code available for payment source

## 2022-09-17 DIAGNOSIS — G40909 Epilepsy, unspecified, not intractable, without status epilepticus: Secondary | ICD-10-CM

## 2022-09-17 DIAGNOSIS — A419 Sepsis, unspecified organism: Secondary | ICD-10-CM | POA: Diagnosis not present

## 2022-09-17 DIAGNOSIS — J9621 Acute and chronic respiratory failure with hypoxia: Secondary | ICD-10-CM | POA: Diagnosis not present

## 2022-09-17 DIAGNOSIS — J690 Pneumonitis due to inhalation of food and vomit: Secondary | ICD-10-CM | POA: Diagnosis not present

## 2022-09-17 DIAGNOSIS — G9340 Encephalopathy, unspecified: Secondary | ICD-10-CM | POA: Diagnosis not present

## 2022-09-17 DIAGNOSIS — R7881 Bacteremia: Secondary | ICD-10-CM

## 2022-09-17 LAB — COMPREHENSIVE METABOLIC PANEL
ALT: 27 U/L (ref 0–44)
AST: 22 U/L (ref 15–41)
Albumin: 1.9 g/dL — ABNORMAL LOW (ref 3.5–5.0)
Alkaline Phosphatase: 94 U/L (ref 38–126)
Anion gap: 9 (ref 5–15)
BUN: 26 mg/dL — ABNORMAL HIGH (ref 8–23)
CO2: 25 mmol/L (ref 22–32)
Calcium: 9 mg/dL (ref 8.9–10.3)
Chloride: 106 mmol/L (ref 98–111)
Creatinine, Ser: 0.42 mg/dL — ABNORMAL LOW (ref 0.61–1.24)
GFR, Estimated: >60 mL/min (ref >60)
Glucose, Bld: 174 mg/dL — ABNORMAL HIGH (ref 70–99)
Potassium: 3.9 mmol/L (ref 3.5–5.1)
Sodium: 140 mmol/L (ref 135–145)
Total Bilirubin: 0.5 mg/dL (ref 0.3–1.2)
Total Protein: 5.8 g/dL — ABNORMAL LOW (ref 6.5–8.1)

## 2022-09-17 LAB — GLUCOSE, CAPILLARY
Glucose-Capillary: 185 mg/dL — ABNORMAL HIGH (ref 70–99)
Glucose-Capillary: 166 mg/dL — ABNORMAL HIGH (ref 70–99)
Glucose-Capillary: 186 mg/dL — ABNORMAL HIGH (ref 70–99)
Glucose-Capillary: 237 mg/dL — ABNORMAL HIGH (ref 70–99)
Glucose-Capillary: 209 mg/dL — ABNORMAL HIGH (ref 70–99)
Glucose-Capillary: 139 mg/dL — ABNORMAL HIGH (ref 70–99)

## 2022-09-17 LAB — CBC
HCT: 37.3 % — ABNORMAL LOW (ref 39.0–52.0)
Hemoglobin: 12 g/dL — ABNORMAL LOW (ref 13.0–17.0)
MCH: 27.9 pg (ref 26.0–34.0)
MCHC: 32.2 g/dL (ref 30.0–36.0)
MCV: 86.7 fL (ref 80.0–100.0)
Platelets: 280 10*3/uL (ref 150–400)
RBC: 4.3 MIL/uL (ref 4.22–5.81)
RDW: 16.5 % — ABNORMAL HIGH (ref 11.5–15.5)
WBC: 9 10*3/uL (ref 4.0–10.5)
nRBC: 0 % (ref 0.0–0.2)

## 2022-09-17 LAB — PROCALCITONIN: Procalcitonin: 1.52 ng/mL

## 2022-09-17 MED ORDER — INSULIN GLARGINE-YFGN 100 UNIT/ML ~~LOC~~ SOLN: 30.0000 [IU] | Freq: Every day | SUBCUTANEOUS | Status: DC

## 2022-09-17 MED ORDER — INSULIN GLARGINE-YFGN 100 UNIT/ML ~~LOC~~ SOLN
25.0000 [IU] | Freq: Every day | SUBCUTANEOUS | Status: DC
  Administered 2022-09-17 – 2022-09-25 (×9): 25 [IU] via SUBCUTANEOUS
  Filled 2022-09-17 (×10): qty 0.25
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
## 2022-09-17 NOTE — Progress Notes (Signed)
RN spoke with [facility] (wife) and provided an update. Wife requested that MD provide an update for pt [phone].

## 2022-09-17 NOTE — TOC Progression Note (Signed)
Transition of Care [facility]) - Progression Note    Patient Details  Name: KAMAURY CUTBIRTH MRN: [id number] Date of Birth: 01-15-1951  Transition of Care [facility]) CM/SW [place], LCSW Phone Number: 09/17/2022, 3:52 PM  Clinical Narrative:    CSW left voicemail for Levada Dy at [facility] to inquire if patient will be able to return on the weekend if needed.    Expected Discharge Plan: [place] Barriers to Discharge: Continued Medical Work up  Expected Discharge Plan and Services Expected Discharge Plan: [place] In-house Referral: Clinical Social Work   Post Acute Care Choice: [place] Living arrangements for the past 2 months: [place] (and [facility])                                       Social Determinants of Health (SDOH) Interventions    Readmission Risk Interventions     No data to display

## 2022-09-17 NOTE — Progress Notes (Signed)
Patient seen today by trach team for consult.  No education is needed at this time.  All necessary equipment is at beside.   Will continue to follow for progression.  

## 2022-09-18 DIAGNOSIS — A419 Sepsis, unspecified organism: Secondary | ICD-10-CM | POA: Diagnosis not present

## 2022-09-18 DIAGNOSIS — Z930 Tracheostomy status: Secondary | ICD-10-CM

## 2022-09-18 DIAGNOSIS — G9340 Encephalopathy, unspecified: Secondary | ICD-10-CM | POA: Diagnosis not present

## 2022-09-18 DIAGNOSIS — R6521 Severe sepsis with septic shock: Secondary | ICD-10-CM | POA: Diagnosis not present

## 2022-09-18 LAB — CULTURE, BLOOD (ROUTINE X 2)
Culture: NO GROWTH
Culture: NO GROWTH

## 2022-09-18 LAB — CBC
HCT: 40.7 % (ref 39.0–52.0)
Hemoglobin: 12.7 g/dL — ABNORMAL LOW (ref 13.0–17.0)
MCH: 27.2 pg (ref 26.0–34.0)
MCHC: 31.2 g/dL (ref 30.0–36.0)
MCV: 87.2 fL (ref 80.0–100.0)
Platelets: 348 10*3/uL (ref 150–400)
RBC: 4.67 MIL/uL (ref 4.22–5.81)
RDW: 16.4 % — ABNORMAL HIGH (ref 11.5–15.5)
WBC: 10.5 10*3/uL (ref 4.0–10.5)
nRBC: 0 % (ref 0.0–0.2)

## 2022-09-18 LAB — GLUCOSE, CAPILLARY
Glucose-Capillary: 129 mg/dL — ABNORMAL HIGH (ref 70–99)
Glucose-Capillary: 188 mg/dL — ABNORMAL HIGH (ref 70–99)
Glucose-Capillary: 215 mg/dL — ABNORMAL HIGH (ref 70–99)
Glucose-Capillary: 230 mg/dL — ABNORMAL HIGH (ref 70–99)
Glucose-Capillary: 128 mg/dL — ABNORMAL HIGH (ref 70–99)
Glucose-Capillary: 93 mg/dL (ref 70–99)

## 2022-09-18 LAB — BASIC METABOLIC PANEL
Anion gap: 8 (ref 5–15)
BUN: 23 mg/dL (ref 8–23)
CO2: 24 mmol/L (ref 22–32)
Calcium: 9 mg/dL (ref 8.9–10.3)
Chloride: 108 mmol/L (ref 98–111)
Creatinine, Ser: 0.36 mg/dL — ABNORMAL LOW (ref 0.61–1.24)
GFR, Estimated: >60 mL/min (ref >60)
Glucose, Bld: 128 mg/dL — ABNORMAL HIGH (ref 70–99)
Potassium: 4.1 mmol/L (ref 3.5–5.1)
Sodium: 140 mmol/L (ref 135–145)

## 2022-09-18 LAB — PROCALCITONIN: Procalcitonin: 0.95 ng/mL

## 2022-09-18 MED ORDER — VANCOMYCIN HCL 1500 MG/300ML IV SOLN
1500.0000 mg | Freq: Once | INTRAVENOUS | Status: AC
  Administered 2022-09-18: 1500 mg via INTRAVENOUS
  Filled 2022-09-18: qty 300

## 2022-09-18 MED ORDER — SODIUM CHLORIDE 0.9 % IV SOLN
2.0000 g | Freq: Three times a day (TID) | INTRAVENOUS | Status: AC
  Administered 2022-09-18 – 2022-09-25 (×21): 2 g via INTRAVENOUS
  Filled 2022-09-18 (×21): qty 12.5

## 2022-09-18 MED ORDER — GLUCERNA 1.5 CAL PO LIQD
1000.0000 mL | ORAL | Status: DC
  Administered 2022-09-18 – 2022-09-24 (×2): 1000 mL
  Filled 2022-09-18 (×11): qty 1000

## 2022-09-18 MED ORDER — METRONIDAZOLE 500 MG/100ML IV SOLN
500.0000 mg | Freq: Two times a day (BID) | INTRAVENOUS | Status: AC
  Administered 2022-09-18 – 2022-09-24 (×14): 500 mg via INTRAVENOUS
  Filled 2022-09-18 (×14): qty 100

## 2022-09-18 NOTE — Progress Notes (Signed)
Pharmacy Antibiotic Note  Joshua Haley is a 71 y.o. male admitted on 09/10/2022 with sepsis.  Pharmacy has been consulted for Vancomycin/Cefepime dosing.  Proteus bacteremia - Ceftriaxone  Plan: Vancomycin 1500 mg iv x 1 then 1 gram iv Q 12 Cefepime 2 grams iv Q 8  Follow LOT  Height: 5' 11.75" (182.2 cm) Weight: 83.3 kg (183 lb 10.3 oz) IBW/kg (Calculated) : 77.03  Temp (24hrs), Avg:99.1 F (37.3 C), Min:97.5 F (36.4 C), Max:101.3 F (38.5 C)  Recent Labs  Lab 09/11/22 1601 09/11/22 1732 09/11/22 2223 09/12/22 0415 09/14/22 0429 09/15/22 0740 09/16/22 0630 09/17/22 0310 09/18/22 0218  WBC  --   --   --    < > 8.4 7.3 8.6 9.0 10.5  CREATININE  --    < >  --    < > 0.44* 0.40* 0.44* 0.42* 0.36*  LATICACIDVEN 3.7*  --  2.7*  --   --   --   --   --   --    < > = values in this interval not displayed.    Estimated Creatinine Clearance: 93.6 mL/min (A) (by C-G formula based on SCr of 0.36 mg/dL (L)).    Allergies  Allergen Reactions   Morphine And Related    Penicillins Other (See Comments)   Statins Nausea And Vomiting    Thank you Anette Guarneri, PharmD  09/18/2022 9:02 AM

## 2022-09-18 NOTE — Plan of Care (Signed)
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
## 2022-09-18 NOTE — Progress Notes (Signed)
Provided Hope (wife) [phone] update with pt. Wife states she was provided updates from [place] of Case Management and Malachy Mood of [facility]. Requested have Elgergawy, MD to call her for an update.

## 2022-09-18 NOTE — TOC Progression Note (Addendum)
Transition of Care [facility]) - Progression Note    Patient Details  Name: Joshua Haley MRN: [id number] Date of Birth: [birth year]  Transition of Care [facility]) CM/SW [place], LCSW Phone Number: 09/18/2022, 8:58 AM  Clinical Narrative:    No response from [place] at [employer]. CSW sent her secure email as well.   Response received from [place]; they are not able to accept patient on weekends. Patient not medically stable. CSW updated Case manager, Malachy Mood [phone]). Will continue to follow.    Expected Discharge Plan: [place] Barriers to Discharge: Continued Medical Work up  Expected Discharge Plan and Services Expected Discharge Plan: [place] In-house Referral: Clinical Social Work   Post Acute Care Choice: [place] Living arrangements for the past 2 months: [place] (and [facility])                                       Social Determinants of Health (SDOH) Interventions    Readmission Risk Interventions     No data to display

## 2022-09-18 NOTE — Progress Notes (Signed)
Nutrition Follow-up  DOCUMENTATION CODES:   Non-severe (moderate) malnutrition in context of chronic illness  INTERVENTION:   Tube Feeds via G-tube: Transition to Glucerna 1.5 at 60 mL/hr (1440 mL/day) 185 mL free water q4h  Provides 2160 kcal, 119 gm protein, and 2203 mL free water daily. Continue 1 packet Juven BID via PEG, each packet provides 95 calories, 2.5 grams of protein (collagen), and 9.8 grams of carbohydrate (3 grams sugar); also contains 7 grams of L-arginine and L-glutamine, 300 mg vitamin C, 15 mg vitamin E, 1.2 mcg vitamin B-12, 9.5 mg zinc, 200 mg calcium, and 1.5 g  Calcium Beta-hydroxy-Beta-methylbutyrate to support wound healing  NUTRITION DIAGNOSIS:   Moderate Malnutrition related to chronic illness as evidenced by mild muscle depletion, mild fat depletion. 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  Pt resting in bed, does not look at RD. Spoke with RN, RN reports no signs of intolerance.    Medications reviewed and include: NovoLog SSI + 8 units, Semglee, Protonix, IV antibiotics  Labs reviewed: 24 hr CBG 129-237  Diet Order:   Diet Order             Diet NPO time specified  Diet effective now                   EDUCATION NEEDS:   Not appropriate for education at this time  Skin:  Skin Assessment: Reviewed RN Assessment Per [place] 9/15: Upper sacrum/buttocks with red moist macerated skin and patchy areas have evolved into Stage 2 wounds which are pink and moist.  Affected area is approx 15X12X.1cm.  Left perineum skin fold also with the same appearance; 2X.5X.1cm pink moist partial thickness fissure related to moisture.   Last BM:  9/22 -  Type 4  Height:   Ht Readings from Last 1 Encounters:  09/11/22 5' 11.75" (1.822 m)    Weight:   Wt Readings from Last 1 Encounters:  09/18/22 83.3 kg    Ideal Body Weight:  80.9 kg  BMI:  Body mass index is 25.08 kg/m.  Estimated Nutritional Needs:   Kcal:  2200-2400 kcal/d  Protein:  115-130g/d  Fluid:  2.2-2.4L/d    Hermina Barters RD, LDN Clinical Dietitian See Shea Evans for contact information.

## 2022-09-19 DIAGNOSIS — A419 Sepsis, unspecified organism: Secondary | ICD-10-CM | POA: Diagnosis not present

## 2022-09-19 DIAGNOSIS — J690 Pneumonitis due to inhalation of food and vomit: Secondary | ICD-10-CM | POA: Diagnosis not present

## 2022-09-19 DIAGNOSIS — R6521 Severe sepsis with septic shock: Secondary | ICD-10-CM | POA: Diagnosis not present

## 2022-09-19 DIAGNOSIS — G9340 Encephalopathy, unspecified: Secondary | ICD-10-CM | POA: Diagnosis not present

## 2022-09-19 LAB — CBC
HCT: 40.3 % (ref 39.0–52.0)
Hemoglobin: 13 g/dL (ref 13.0–17.0)
MCH: 28.4 pg (ref 26.0–34.0)
MCHC: 32.3 g/dL (ref 30.0–36.0)
MCV: 88.2 fL (ref 80.0–100.0)
Platelets: 445 10*3/uL — ABNORMAL HIGH (ref 150–400)
RBC: 4.57 MIL/uL (ref 4.22–5.81)
RDW: 16.5 % — ABNORMAL HIGH (ref 11.5–15.5)
WBC: 10.1 10*3/uL (ref 4.0–10.5)
nRBC: 0 % (ref 0.0–0.2)

## 2022-09-19 LAB — GLUCOSE, CAPILLARY
Glucose-Capillary: 92 mg/dL (ref 70–99)
Glucose-Capillary: 166 mg/dL — ABNORMAL HIGH (ref 70–99)
Glucose-Capillary: 143 mg/dL — ABNORMAL HIGH (ref 70–99)
Glucose-Capillary: 131 mg/dL — ABNORMAL HIGH (ref 70–99)
Glucose-Capillary: 118 mg/dL — ABNORMAL HIGH (ref 70–99)
Glucose-Capillary: 146 mg/dL — ABNORMAL HIGH (ref 70–99)
Glucose-Capillary: 180 mg/dL — ABNORMAL HIGH (ref 70–99)

## 2022-09-19 LAB — BASIC METABOLIC PANEL
Anion gap: 6 (ref 5–15)
BUN: 27 mg/dL — ABNORMAL HIGH (ref 8–23)
CO2: 24 mmol/L (ref 22–32)
Calcium: 8.9 mg/dL (ref 8.9–10.3)
Chloride: 113 mmol/L — ABNORMAL HIGH (ref 98–111)
Creatinine, Ser: 0.45 mg/dL — ABNORMAL LOW (ref 0.61–1.24)
GFR, Estimated: >60 mL/min (ref >60)
Glucose, Bld: 147 mg/dL — ABNORMAL HIGH (ref 70–99)
Potassium: 4.2 mmol/L (ref 3.5–5.1)
Sodium: 143 mmol/L (ref 135–145)

## 2022-09-19 LAB — MAGNESIUM: Magnesium: 1.8 mg/dL (ref 1.7–2.4)

## 2022-09-19 LAB — PHOSPHORUS: Phosphorus: 3.3 mg/dL (ref 2.5–4.6)

## 2022-09-19 MED ORDER — VANCOMYCIN HCL IN DEXTROSE 1-5 GM/200ML-% IV SOLN
1000.0000 mg | Freq: Two times a day (BID) | INTRAVENOUS | Status: DC
  Administered 2022-09-19 – 2022-09-20 (×3): 1000 mg via INTRAVENOUS
  Filled 2022-09-19 (×6): qty 200

## 2022-09-19 MED ORDER — VANCOMYCIN HCL 1500 MG/300ML IV SOLN
1500.0000 mg | Freq: Once | INTRAVENOUS | Status: AC
  Administered 2022-09-19: 1500 mg via INTRAVENOUS
  Filled 2022-09-19: qty 300

## 2022-09-19 NOTE — Progress Notes (Addendum)
Pharmacy Antibiotic Note  Joshua Haley is a 71 y.o. male admitted on 09/10/2022 with sepsis secondary to UTI. On 9/22 CT revealed bibasilar opacities consistent with pneumoina.  Pharmacy has been consulted for Vancomycin/Cefepime dosing for empiric treatment treatment of HAP and proteus bacteremia  Plan: Vancomycin 1500 mg IV LD x 1 then 1g iv Q12 Cefepime 2 grams iv Q 8  Monitor renal function and de-escalate with cultures  Height: 5' 11.75" (182.2 cm) Weight: 81.1 kg (178 lb 12.7 oz) IBW/kg (Calculated) : 77.03  Temp (24hrs), Avg:99.6 F (37.6 C), Min:98.7 F (37.1 C), Max:100.1 F (37.8 C)  Recent Labs  Lab 09/14/22 0429 09/15/22 0740 09/16/22 0630 09/17/22 0310 09/18/22 0218  WBC 8.4 7.3 8.6 9.0 10.5  CREATININE 0.44* 0.40* 0.44* 0.42* 0.36*     Estimated Creatinine Clearance: 93.6 mL/min (A) (by C-G formula based on SCr of 0.36 mg/dL (L)).    Allergies  Allergen Reactions   Morphine And Related    Penicillins Other (See Comments)   Statins Nausea And Vomiting   Antibiotics:  Vanco 9/14 >> 9/15  Restarted 9/22> Cefepime 9/14 >> 9/15  Restarted 9/22> CFTX 9/15 >>9/22  Dose adjustments:    Cultures: 9/14 MRSA PCR negative 9/15 UCX: Proteus mirabilis macrobid (R), Bactrim (R) 9/14 [id number] mirabilis bactrim (R) 9/17 Bcx x2: ngF 9/22 tracheal aspirate: rare gram positive rods   Titus Dubin, PharmD PGY1 Pharmacy Resident 09/19/2022 8:11 AM
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
## 2022-09-20 DIAGNOSIS — J690 Pneumonitis due to inhalation of food and vomit: Secondary | ICD-10-CM | POA: Diagnosis not present

## 2022-09-20 DIAGNOSIS — G9340 Encephalopathy, unspecified: Secondary | ICD-10-CM | POA: Diagnosis not present

## 2022-09-20 DIAGNOSIS — J9621 Acute and chronic respiratory failure with hypoxia: Secondary | ICD-10-CM | POA: Diagnosis not present

## 2022-09-20 DIAGNOSIS — A419 Sepsis, unspecified organism: Secondary | ICD-10-CM | POA: Diagnosis not present

## 2022-09-20 LAB — BASIC METABOLIC PANEL
Anion gap: 9 (ref 5–15)
BUN: 31 mg/dL — ABNORMAL HIGH (ref 8–23)
CO2: 25 mmol/L (ref 22–32)
Calcium: 9.2 mg/dL (ref 8.9–10.3)
Chloride: 109 mmol/L (ref 98–111)
Creatinine, Ser: 0.54 mg/dL — ABNORMAL LOW (ref 0.61–1.24)
GFR, Estimated: >60 mL/min (ref >60)
Glucose, Bld: 149 mg/dL — ABNORMAL HIGH (ref 70–99)
Potassium: 3.7 mmol/L (ref 3.5–5.1)
Sodium: 143 mmol/L (ref 135–145)

## 2022-09-20 LAB — CBC
HCT: 41.1 % (ref 39.0–52.0)
Hemoglobin: 12.6 g/dL — ABNORMAL LOW (ref 13.0–17.0)
MCH: 27.5 pg (ref 26.0–34.0)
MCHC: 30.7 g/dL (ref 30.0–36.0)
MCV: 89.5 fL (ref 80.0–100.0)
Platelets: 561 10*3/uL — ABNORMAL HIGH (ref 150–400)
RBC: 4.59 MIL/uL (ref 4.22–5.81)
RDW: 16.7 % — ABNORMAL HIGH (ref 11.5–15.5)
WBC: 10 10*3/uL (ref 4.0–10.5)
nRBC: 0 % (ref 0.0–0.2)

## 2022-09-20 LAB — GLUCOSE, CAPILLARY
Glucose-Capillary: 119 mg/dL — ABNORMAL HIGH (ref 70–99)
Glucose-Capillary: 143 mg/dL — ABNORMAL HIGH (ref 70–99)
Glucose-Capillary: 143 mg/dL — ABNORMAL HIGH (ref 70–99)
Glucose-Capillary: 147 mg/dL — ABNORMAL HIGH (ref 70–99)
Glucose-Capillary: 167 mg/dL — ABNORMAL HIGH (ref 70–99)
Glucose-Capillary: 175 mg/dL — ABNORMAL HIGH (ref 70–99)

## 2022-09-20 LAB — MAGNESIUM: Magnesium: 1.8 mg/dL (ref 1.7–2.4)

## 2022-09-20 LAB — PHOSPHORUS: Phosphorus: 3 mg/dL (ref 2.5–4.6)
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
## 2022-09-20 NOTE — Progress Notes (Signed)
RT called to bedside due to pt desat.RT arrived SpO2 in high 70- low 80. Pt placed on ATC FiO2 increased to 80%, and RT suctioned pt copious amounts of secretions, SpO2 increased to 94%. RN aware, MD aware, pt on ATC 10L 40%, no resp. distress noted at this time, RT will monitor.

## 2022-09-20 NOTE — Progress Notes (Signed)
Pt's o2 sat was 78 to 79 with T-piece on.Suctioning done but was still the same,informed respiratory.Replaced with reg trach mask sat maintained to 92

## 2022-09-21 ENCOUNTER — Inpatient Hospital Stay (HOSPITAL_COMMUNITY): Payer: No Typology Code available for payment source

## 2022-09-21 DIAGNOSIS — Z930 Tracheostomy status: Secondary | ICD-10-CM | POA: Diagnosis not present

## 2022-09-21 DIAGNOSIS — J9621 Acute and chronic respiratory failure with hypoxia: Secondary | ICD-10-CM | POA: Diagnosis not present

## 2022-09-21 DIAGNOSIS — J690 Pneumonitis due to inhalation of food and vomit: Secondary | ICD-10-CM | POA: Diagnosis not present

## 2022-09-21 DIAGNOSIS — A419 Sepsis, unspecified organism: Secondary | ICD-10-CM | POA: Diagnosis not present

## 2022-09-21 LAB — GLUCOSE, CAPILLARY
Glucose-Capillary: 157 mg/dL — ABNORMAL HIGH (ref 70–99)
Glucose-Capillary: 86 mg/dL (ref 70–99)
Glucose-Capillary: 71 mg/dL (ref 70–99)
Glucose-Capillary: 124 mg/dL — ABNORMAL HIGH (ref 70–99)
Glucose-Capillary: 174 mg/dL — ABNORMAL HIGH (ref 70–99)
Glucose-Capillary: 151 mg/dL — ABNORMAL HIGH (ref 70–99)

## 2022-09-21 LAB — CULTURE, RESPIRATORY W GRAM STAIN

## 2022-09-21 MED ORDER — DEXTROSE-NACL 5-0.45 % IV SOLN: INTRAVENOUS | Status: DC

## 2022-09-21 MED ORDER — GUAIFENESIN 100 MG/5ML PO LIQD
5.0000 mL | Freq: Every day | ORAL | Status: DC
  Administered 2022-09-21 – 2022-09-25 (×5): 5 mL
  Filled 2022-09-21 (×5): qty 5

## 2022-09-21 MED ORDER — SODIUM CHLORIDE 3 % IN NEBU
4.0000 mL | INHALATION_SOLUTION | Freq: Two times a day (BID) | RESPIRATORY_TRACT | Status: AC
  Administered 2022-09-21 – 2022-09-23 (×5): 4 mL via RESPIRATORY_TRACT
  Filled 2022-09-21 (×6): qty 4
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
## 2022-09-21 NOTE — Progress Notes (Signed)
Referring Physician(s): Emeline Gins Elgergawy  Supervising Physician: Markus Daft  Patient Status:  [facility] - In-pt  Chief Complaint:  Clogged G-tube  Subjective:  Received call from floor regarding clogged G-tube.  Reportedly, tube feeds were disconnected during the night and the tube was left with feed in it.  Multiple attempts at bedside to declog were unsuccessful.  Tube placed at outside facility, first presence of tube in PACS on plain film taken 06/19/2022.  Allergies: Morphine and related, Penicillins, and Statins  Medications: Prior to Admission medications   Medication Sig Start Date End Date Taking? Authorizing Provider  acetaminophen (TYLENOL) 325 MG tablet Give 650mg  per tube every 8 hours as needed for pain   Yes [provider]  amantadine (SYMMETREL) 50 MG/5ML solution Place 100 mg into feeding tube daily.   Yes [provider]  Amino Acids-Protein Hydrolys (PRO-STAT AWC) LIQD Place 30 mLs into feeding tube daily.   Yes [provider]  amLODipine (NORVASC) 10 MG tablet Give 10mg  per tube daily   Yes [provider]  ascorbic acid (VITAMIN C) 500 MG tablet Give 500mg  per tube daily   Yes [provider]  enoxaparin (LOVENOX) 40 MG/0.4ML injection Inject 40 mg into the skin daily.   Yes [provider]  ferrous sulfate 300 (60 Fe) MG/5ML syrup Place 300 mg into feeding tube 2 (two) times daily with a meal.   Yes [provider]  insulin glargine (LANTUS) 100 UNIT/ML injection Inject 0.15 mLs (15 Units total) into the skin daily. Patient taking differently: Inject 8 Units into the skin daily. 01/23/20  Yes Enzo Bi, MD  insulin regular (NOVOLIN R) 100 units/mL injection Inject 12 Units into the skin every 6 (six) hours.   Yes [provider]  lansoprazole (PREVACID SOLUTAB) 30 MG disintegrating tablet Give 30mg  per tube daily   Yes [provider]  levETIRAcetam (KEPPRA) 100 MG/ML solution  Place 250 mg into feeding tube every 12 (twelve) hours.   Yes [provider]  levothyroxine (SYNTHROID) 100 MCG tablet Give 170mcg per tube daily   Yes [provider]  liothyronine (CYTOMEL) 5 MCG tablet Give one and one half tablet (7.5mg ) per tube every 12 hours   Yes [provider]  lisinopril (ZESTRIL) 20 MG tablet Take 1 tablet (20 mg total) by mouth daily. Patient taking differently: 5 mg See admin instructions. Give 5mg  per tube daily 01/22/20  Yes Enzo Bi, MD  Methylcellulose, Laxative, (CITRUCEL) 500 MG TABS Place 500 mg into feeding tube 3 (three) times daily.   Yes [provider]  Metoprolol Tartrate 37.5 MG TABS Give 37.5mg  per tube every 12 hours 01/30/20 09/12/23 Yes [provider]  modafinil (PROVIGIL) 100 MG tablet Give 50mg  (one half tablet) per tube daily   Yes [provider]  multivitamin (ONE-A-DAY MEN'S) TABS tablet Give 1 tablet per tube daily   Yes [provider]  Nutritional Supplements (FEEDING SUPPLEMENT, GLUCERNA 1.5 CAL,) LIQD Give 43mL/hr per tube by shift   Yes [provider]  omega-3 acid ethyl esters (LOVAZA) 1 g capsule Give 1 g per tube daily   Yes [provider]  polyethylene glycol (MIRALAX / GLYCOLAX) 17 g packet Give 17g per tube daily as needed for constipation   Yes [provider]  sertraline (ZOLOFT) 25 MG tablet Give 25mg  per tube daily   Yes [provider]  terazosin (HYTRIN) 5 MG capsule Give 5mg  per tube every night   Yes [provider]     Vital Signs: BP (!) 117/57 (BP Location: Right Arm)   Pulse 94   Temp 99 F (37.2 C) (Axillary)   Resp 20   Ht 5' 11.75" (1.822 m)   Wt 185 lb 3 oz (84 kg)   SpO2 96%   BMI 25.29 kg/m   Physical Exam Vitals reviewed.  Constitutional:      Appearance: He is ill-appearing.     Comments: trach'd  Abdominal:     General: Abdomen is flat.     Comments: G-tube in place and secured with  bumper at level of skin.  No evidence for leakage.  Tube is filled with beige colored contents consistent with tube feeds.  Unable to flush with 10cc syringe with cath tip added.     Imaging: DG Chest Port 1 View  Result Date: 09/21/2022 CLINICAL DATA:  Hypoxia. EXAM: PORTABLE CHEST 1 VIEW COMPARISON:  September 12, 2022 FINDINGS: Rounded opacity in the lateral left lung base is new. Mild opacity in the right mid lower lung is new. The tracheostomy tube remains in place. No pneumothorax. No other interval changes. IMPRESSION: New bilateral pulmonary opacities are worrisome for pneumonia or aspiration. Recommend short-term follow-up to ensure resolution. No other changes. Electronically Signed   By: Gerome Sam III M.D.   On: 09/21/2022 08:08   CT RENAL STONE STUDY  Result Date: 09/17/2022 CLINICAL DATA:  Septic shock.  Low-grade fever. EXAM: CT ABDOMEN AND PELVIS WITHOUT CONTRAST TECHNIQUE: Multidetector CT imaging of the abdomen and pelvis was performed following the standard protocol without IV contrast. RADIATION DOSE REDUCTION: This exam was performed according to the departmental dose-optimization program which includes automated exposure control, adjustment of the mA and/or kV according to patient size and/or use of iterative reconstruction technique. COMPARISON:  None Available. FINDINGS: Lower chest: Bibasilar consolidations and ground-glass densities, most prominent in the lower lobes. Hepatobiliary: No focal liver abnormality is seen. Status post cholecystectomy. No biliary dilatation. Pancreas: Unremarkable. No pancreatic ductal dilatation or surrounding inflammatory changes. Spleen: Normal in size without focal abnormality. Adrenals/Urinary Tract: The adrenal glands are unremarkable. Small bilateral renal calculi. No hydronephrosis. Multiple small layering calculi within the bladder, which is mostly decompressed by Foley catheter. Stomach/Bowel: Gastrostomy tube within the stomach, which  is otherwise within normal limits. No bowel wall thickening, distention, or surrounding inflammatory changes. Colonic diverticulosis. Normal appendix. Vascular/Lymphatic: Aortic atherosclerosis. No enlarged abdominal or pelvic lymph nodes. Reproductive: Prostate is unremarkable. Other: No free fluid or pneumoperitoneum. Musculoskeletal: No acute or significant osseous findings. IMPRESSION: 1. Multifocal bibasilar pneumonia. 2. No acute intra-abdominal process. 3. Bilateral nonobstructive nephrolithiasis. Multiple small layering calculi within the bladder. 4. Aortic Atherosclerosis (ICD10-I70.0). Electronically Signed   By: Obie Dredge M.D.   On: 09/17/2022 15:51    Labs:  CBC: Recent Labs    09/17/22 0310 09/18/22 0218 09/19/22 0732 09/20/22 0624  WBC 9.0 10.5 10.1 10.0  HGB 12.0* 12.7* 13.0 12.6*  HCT 37.3* 40.7 40.3 41.1  PLT 280 348 445* 561*    COAGS: Recent Labs    09/10/22 2012  INR 1.2  APTT 29    BMP: Recent Labs    09/17/22 0310 09/18/22 0218 09/19/22 0732 09/20/22 0624  NA 140 140 143 143  K 3.9 4.1 4.2 3.7  CL 106 108 113* 109  CO2 25 24 24 25   GLUCOSE 174* 128* 147* 149*  BUN 26* 23 27* 31*  CALCIUM 9.0 9.0 8.9 9.2  CREATININE 0.42* 0.36* 0.45* 0.54*  GFRNONAA >60 >60 >60 >60    LIVER FUNCTION TESTS: Recent Labs    09/10/22 2012 09/12/22 0415 09/14/22 0429 09/15/22 0740 09/17/22 0310  BILITOT 1.2 1.0 0.6  --  0.5  AST 73* 26 12*  --  22  ALT 43 39 20  --  27  ALKPHOS 165* 101 98  --  94  PROT 6.2* 5.3* 5.4*  --  5.8*  ALBUMIN 3.0* 2.2* 1.9* 1.9* 1.9*    Assessment and Plan:  Clogged G-tube -- a Declogger was utilized with alternating with saline syringe was utilized to attain patency of G-tube.  Approximately a 2 ml of tube feed solution was removed from the tube.  Aggressive flushing performed once patency achieved.  Tube ready for use.   Electronically Signed: Sheliah Plane, PA 09/21/2022, 12:14 PM   I spent a total of 25  Minutes at the the patient's bedside AND on the patient's hospital floor or unit, greater than 50% of which was counseling/coordinating care for clogged G-tube.

## 2022-09-21 NOTE — Progress Notes (Signed)
Upon entering pt's room, pt's SpO2 was 82%.  Pt increased to 80% ATC and lavaged. Tons of white think secretions obtained.  Able to wean pt back to 40% ATC prior to CPT. Pt's SpO2 dropped to upper 80's while doing CPT. CPT stopped. Pt suctioned again, tons of secretions obtained. FiO2 increased to 60% as documented due to SpO2 not increasing above 90.  SpO2 increased to 92-94% on 60%.  No distress noted.
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
## 2022-09-22 DIAGNOSIS — A419 Sepsis, unspecified organism: Secondary | ICD-10-CM | POA: Diagnosis not present

## 2022-09-22 DIAGNOSIS — Z930 Tracheostomy status: Secondary | ICD-10-CM | POA: Diagnosis not present

## 2022-09-22 DIAGNOSIS — J9621 Acute and chronic respiratory failure with hypoxia: Secondary | ICD-10-CM | POA: Diagnosis not present

## 2022-09-22 DIAGNOSIS — R6521 Severe sepsis with septic shock: Secondary | ICD-10-CM | POA: Diagnosis not present

## 2022-09-22 DIAGNOSIS — J690 Pneumonitis due to inhalation of food and vomit: Secondary | ICD-10-CM | POA: Diagnosis not present

## 2022-09-22 LAB — GLUCOSE, CAPILLARY
Glucose-Capillary: 60 mg/dL — ABNORMAL LOW (ref 70–99)
Glucose-Capillary: 159 mg/dL — ABNORMAL HIGH (ref 70–99)
Glucose-Capillary: 178 mg/dL — ABNORMAL HIGH (ref 70–99)
Glucose-Capillary: 139 mg/dL — ABNORMAL HIGH (ref 70–99)
Glucose-Capillary: 130 mg/dL — ABNORMAL HIGH (ref 70–99)
Glucose-Capillary: 128 mg/dL — ABNORMAL HIGH (ref 70–99)
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
## 2022-09-22 NOTE — TOC Progression Note (Signed)
Transition of Care [facility]) - Progression Note    Patient Details  Name: ZAKHI DUPRE MRN: [id number] Date of Birth: 1951/09/16  Transition of Care [facility]) CM/SW [place], LCSW Phone Number: 09/22/2022, 4:07 PM  Clinical Narrative:    CSW provided update to [facility] and patient's CM.    Expected Discharge Plan: [place] Barriers to Discharge: Continued Medical Work up  Expected Discharge Plan and Services Expected Discharge Plan: [place] In-house Referral: Clinical Social Work   Post Acute Care Choice: [place] Living arrangements for the past 2 months: [place] (and [facility])                                       Social Determinants of Health (SDOH) Interventions    Readmission Risk Interventions     No data to display

## 2022-09-22 NOTE — Progress Notes (Signed)
   09/22/22 1140  Assess: MEWS Score  Temp 98.9 F (37.2 C)  BP 116/66  MAP (mmHg) 82  Pulse Rate 93  ECG Heart Rate 93  Resp (!) 23  SpO2 95 %  Assess: MEWS Score  MEWS Temp 0  MEWS Systolic 0  MEWS Pulse 0  MEWS RR 1  MEWS LOC 1  MEWS Score 2  MEWS Score Color Yellow  Assess: if the MEWS score is Yellow or Red  Were vital signs taken at a resting state? Yes  Focused Assessment No change from prior assessment  Does the patient meet 2 or more of the SIRS criteria? No  Does the patient have a confirmed or suspected source of infection? Yes  Provider and Rapid Response Notified? No  MEWS guidelines implemented *See Row Information* No, altered LOC is baseline  Assess: SIRS CRITERIA  SIRS Temperature  0  SIRS Pulse 1  SIRS Respirations  1  SIRS WBC 1  SIRS Score Sum  3

## 2022-09-22 NOTE — Plan of Care (Signed)
  Problem: Education: Goal: Knowledge of General Education information will improve Description Including pain rating scale, medication(s)/side effects and non-pharmacologic comfort measures Outcome: Progressing   

## 2022-09-23 DIAGNOSIS — J690 Pneumonitis due to inhalation of food and vomit: Secondary | ICD-10-CM | POA: Diagnosis not present

## 2022-09-23 DIAGNOSIS — G9340 Encephalopathy, unspecified: Secondary | ICD-10-CM | POA: Diagnosis not present

## 2022-09-23 DIAGNOSIS — A419 Sepsis, unspecified organism: Secondary | ICD-10-CM | POA: Diagnosis not present

## 2022-09-23 DIAGNOSIS — J9621 Acute and chronic respiratory failure with hypoxia: Secondary | ICD-10-CM | POA: Diagnosis not present

## 2022-09-23 LAB — GLUCOSE, CAPILLARY
Glucose-Capillary: 128 mg/dL — ABNORMAL HIGH (ref 70–99)
Glucose-Capillary: 110 mg/dL — ABNORMAL HIGH (ref 70–99)
Glucose-Capillary: 172 mg/dL — ABNORMAL HIGH (ref 70–99)
Glucose-Capillary: 132 mg/dL — ABNORMAL HIGH (ref 70–99)
Glucose-Capillary: 128 mg/dL — ABNORMAL HIGH (ref 70–99)
Glucose-Capillary: 154 mg/dL — ABNORMAL HIGH (ref 70–99)

## 2022-09-23 LAB — BASIC METABOLIC PANEL
Anion gap: 9 (ref 5–15)
BUN: 29 mg/dL — ABNORMAL HIGH (ref 8–23)
CO2: 28 mmol/L (ref 22–32)
Calcium: 9.5 mg/dL (ref 8.9–10.3)
Chloride: 109 mmol/L (ref 98–111)
Creatinine, Ser: 0.4 mg/dL — ABNORMAL LOW (ref 0.61–1.24)
GFR, Estimated: >60 mL/min (ref >60)
Glucose, Bld: 124 mg/dL — ABNORMAL HIGH (ref 70–99)
Potassium: 3.9 mmol/L (ref 3.5–5.1)
Sodium: 146 mmol/L — ABNORMAL HIGH (ref 135–145)

## 2022-09-23 MED ORDER — FREE WATER
200.0000 mL | Freq: Three times a day (TID) | Status: DC
  Administered 2022-09-23 – 2022-09-25 (×7): 200 mL

## 2022-09-23 NOTE — Progress Notes (Signed)
PROGRESS NOTE        PATIENT DETAILS Name: Joshua Haley Age: 71 y.o. Sex: male Date of Birth: [birth year] Admit Date: 09/10/2022 Admitting Physician Charlotte SanesNicole Gonzales, MD WJX:BJ[facility]WG[facility]P:[facility]  Brief Summary: Patient is a [age] y.o.  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  Unfortunately-Hospital course complicated by development of aspiration PNA.  Significant events: 9/14>> admit to ICU-septic shock. 9/18>> [facility]  Significant studies: 9/14>> CXR: No PNA 9/15>> CT head: No acute intracranial abnormality, previous right frontotemporal craniotomy. 9/21>> CT renal stone: Multifocal bibasilar PNA, bilateral nonobstructive nephrolithiasis. 9/25>> CXR: New bilateral pulmonary opacities.  Significant microbiology data: 9/14>> COVID/influenza PCR: Negative 9/14>> blood culture: Proteus 9/15>> urine culture: Proteus 9/17>> blood culture: No growth 9/22>> tracheal aspirate: Pseudomonas/Klebsiella  Procedures: None  Consults: PCCM  Subjective: Nonverbal-afebrile-not in any distress  Objective: Vitals: Blood pressure 124/67, pulse (!) 104, temperature 99.3 F (37.4 C), temperature source Axillary, resp. rate (!) 23, height 5' 11.75" (1.822 m), weight 82.1 kg, SpO2 95 %.   Exam: Gen Exam:not in any distress HEENT:atraumatic, normocephalic Chest: B/L clear to auscultation anteriorly CVS:S1S2 regular Abdomen:soft non tender, non distended Extremities:no edema Neurology: Does not follow commands-unable to evaluate further.  Nonverbal. Skin: no rash  Pertinent Labs/Radiology:    Latest Ref Rng & Units 09/20/2022    6:24 AM 09/19/2022    7:32 AM 09/18/2022    2:18 AM  CBC  WBC 4.0 - 10.5 K/uL 10.0  10.1   10.5   Hemoglobin 13.0 - 17.0 g/dL 95.612.6  21.313.0  08.612.7   Hematocrit 39.0 - 52.0 % 41.1  40.3  40.7   Platelets 150 - 400 K/uL 561  445  348     Lab Results  Component Value Date   NA 146 (H) 09/23/2022   K 3.9 09/23/2022   CL 109 09/23/2022   CO2 28 09/23/2022      Assessment/Plan: Septic shock due to Proteus bacteremia/complicated UTI Sepsis physiology has resolved-has finished a course of Rocephin. UTI likely related to chronic indwelling Foley catheter-recent Foley trauma.  Acute on chronic hypoxic respiratory failure due to aspiration pneumonia Improving-on around 25-40% FiO2-gust with pharmacy-will complete a total of 14 days of antimicrobial therapy on 9/28.  Unfortunately-remains at risk for aspiration episodes in the future.  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  Nonverbal at baseline. Continue Keppra/Provigil/amantadine/Zoloft.  EEG on 9/20 without seizures.  Chronic indwelling Foley catheter Urethral bleeding due to Foley trauma Patient pulled Foley catheter prior to admission resulting in bleeding.  Urethral bleeding has resolved-Foley catheter has been replaced.  Oropharyngeal dysphagia in the setting of TBI PEG tube in place-tolerating feeds  Hypothyroidism Continue Cytomel/Synthroid  DM-2 CBG stable-continue Semglee 25 units daily, 8 units of NovoLog every 4 hours and SSI.  Oral hypoglycemics remain on hold  Recent Labs    09/22/22 2302 09/23/22 0434 09/23/22 0802  GLUCAP 128* 128* 110*    HTN BP stable-all antihypertensives remain on hold.  Resume when able.  GERD PPI  Nutrition Status: Nutrition Problem: Moderate Malnutrition Etiology: chronic illness Signs/Symptoms: mild muscle depletion, mild fat depletion Interventions:  Refer to RD note for recommendations   Pressure Ulcer: Pressure Injury 09/11/22 Sacrum Mid Stage 2  -  Partial thickness loss of dermis presenting as a shallow open injury with a red, pink wound bed without slough. masd with patchy areas of stage 2 pressure injury on sacrum - 15 cm X 12 cm (Active)  09/11/22 0345  Location: Sacrum  Location Orientation: Mid  Staging: Stage 2 -  Partial thickness loss of dermis presenting as a shallow open injury with a red, pink wound bed without slough.  Wound Description (Comments): masd with patchy areas of stage 2 pressure injury on sacrum - 15 cm X 12 cm  Present on Admission: Yes  Dressing Type Foam - Lift dressing to assess site every shift 09/22/22 2026     Pressure Injury 09/11/22 Perineum Left Stage 2 -  Partial thickness loss of dermis presenting as a shallow open injury with a red, pink wound bed without slough. fissure on left inner cheek 2 cm X 0.5 cm (Active)  09/11/22 0350  Location: Perineum  Location Orientation: Left  Staging: Stage 2 -  Partial thickness loss of dermis presenting as a shallow open injury with a red, pink wound bed without slough.  Wound Description (Comments): fissure on left inner cheek 2 cm X 0.5 cm  Present on Admission: Yes  Dressing Type Foam - Lift dressing to assess site every shift 09/22/22 1550    BMI/Obesit: Estimated body mass index is 24.72 kg/m as calculated from the following:   Height as of this encounter: 5' 11.75" (1.822 m).   Weight as of this encounter: 82.1 kg.   Code status:   Code Status: DNR   DVT Prophylaxis: enoxaparin (LOVENOX) injection 40 mg Start: 09/12/22 1230 SCDs Start: 09/10/22 2037    Family Communication: Spouse-Hope [phone] 9/27   Disposition Plan: Status is: Inpatient Remains inpatient appropriate because: Resolving aspiration pneumonia-on IV antibiotics   Planned Discharge Destination:Skilled nursing facility hopefully on 9/28.   Diet: Diet Order             Diet NPO time specified  Diet effective now                     Antimicrobial  agents: Anti-infectives (From admission, onward)    Start     Dose/Rate Route Frequency Ordered Stop   09/19/22 2100  vancomycin (VANCOCIN) IVPB 1000 mg/200 mL premix  Status:  Discontinued        1,000 mg 200 mL/hr over 60 Minutes Intravenous Every 12 hours 09/19/22 0814 09/21/22 0958   09/19/22 0900  vancomycin (VANCOREADY) IVPB 1500 mg/300 mL        1,500 mg 150 mL/hr over 120 Minutes Intravenous  Once 09/19/22 0814 09/19/22 1346   09/18/22 1215  metroNIDAZOLE (FLAGYL) IVPB 500 mg        500 mg 100 mL/hr over 60 Minutes Intravenous 2 times daily 09/18/22 1149     09/18/22 0900  ceFEPIme (MAXIPIME) 2 g in sodium chloride 0.9 % 100 mL IVPB        2 g 200 mL/hr over 30 Minutes Intravenous Every 8 hours 09/18/22 0802     09/18/22 0900  vancomycin (VANCOREADY) IVPB 1500 mg/300 mL        1,500 mg 150 mL/hr over 120 Minutes Intravenous  Once 09/18/22 0802 09/18/22 1448   09/11/22 1400  cefTRIAXone (ROCEPHIN) 2 g in sodium chloride 0.9 % 100 mL IVPB  Status:  Discontinued  2 g 200 mL/hr over 30 Minutes Intravenous Every 24 hours 09/11/22 1239 09/18/22 0728   09/11/22 1100  vancomycin (VANCOCIN) IVPB 1000 mg/200 mL premix  Status:  Discontinued        1,000 mg 200 mL/hr over 60 Minutes Intravenous Every 12 hours 09/10/22 2155 09/10/22 2200   09/11/22 1100  vancomycin (VANCOREADY) IVPB 750 mg/150 mL  Status:  Discontinued        750 mg 150 mL/hr over 60 Minutes Intravenous Every 12 hours 09/10/22 2200 09/11/22 1157   09/11/22 0300  ceFEPIme (MAXIPIME) 2 g in sodium chloride 0.9 % 100 mL IVPB  Status:  Discontinued        2 g 200 mL/hr over 30 Minutes Intravenous Every 8 hours 09/10/22 2159 09/11/22 1239   09/10/22 2000  vancomycin (VANCOREADY) IVPB 1500 mg/300 mL        1,500 mg 150 mL/hr over 120 Minutes Intravenous  Once 09/10/22 1931 09/10/22 2313   09/10/22 1915  ceFEPIme (MAXIPIME) 2 g in sodium chloride 0.9 % 100 mL IVPB        2 g 200 mL/hr over 30 Minutes Intravenous  Once  09/10/22 1911 09/10/22 1958   09/10/22 1915  vancomycin (VANCOCIN) IVPB 1000 mg/200 mL premix  Status:  Discontinued        1,000 mg 200 mL/hr over 60 Minutes Intravenous  Once 09/10/22 1911 09/10/22 1931        MEDICATIONS: Scheduled Meds:  amantadine  100 mg Per Tube Daily   Chlorhexidine Gluconate Cloth  6 each Topical Daily   enoxaparin (LOVENOX) injection  40 mg Subcutaneous Daily   guaiFENesin  5 mL Per Tube Daily   insulin aspart  0-20 Units Subcutaneous Q4H   insulin aspart  8 Units Subcutaneous Q4H   insulin glargine-yfgn  25 Units Subcutaneous Daily   levETIRAcetam  250 mg Per Tube BID   levothyroxine  100 mcg Per Tube QAC breakfast   liothyronine  7.5 mcg Per Tube BID   liver oil-zinc oxide   Topical BID   modafinil  50 mg Per Tube Daily   nutrition supplement (JUVEN)  1 packet Per Tube BID BM   mouth rinse  15 mL Mouth Rinse 4 times per day   pantoprazole  40 mg Per Tube Daily   sertraline  25 mg Per Tube Daily   sodium chloride flush  10-40 mL Intracatheter Q12H   sodium chloride HYPERTONIC  4 mL Nebulization BID   Continuous Infusions:  ceFEPime (MAXIPIME) IV 2 g (09/23/22 0523)   feeding supplement (GLUCERNA 1.5 CAL) 60 mL/hr at 09/20/22 1522   metronidazole 500 mg (09/23/22 0951)   PRN Meds:.acetaminophen (TYLENOL) oral liquid 160 mg/5 mL, docusate sodium, fentaNYL (SUBLIMAZE) injection, ondansetron (ZOFRAN) IV, mouth rinse, polyethylene glycol, sodium chloride flush, white petrolatum   I have personally reviewed following labs and imaging studies  LABORATORY DATA: CBC: Recent Labs  Lab 09/17/22 0310 09/18/22 0218 09/19/22 0732 09/20/22 0624  WBC 9.0 10.5 10.1 10.0  HGB 12.0* 12.7* 13.0 12.6*  HCT 37.3* 40.7 40.3 41.1  MCV 86.7 87.2 88.2 89.5  PLT 280 348 445* 561*    Basic Metabolic Panel: Recent Labs  Lab 09/17/22 0310 09/18/22 0218 09/19/22 0732 09/20/22 0624 09/23/22 0402  NA 140 140 143 143 146*  K 3.9 4.1 4.2 3.7 3.9  CL 106 108  113* 109 109  CO2 25 24 24 25 28   GLUCOSE 174* 128* 147* 149* 124*  BUN 26* 23 27*  31* 29*  CREATININE 0.42* 0.36* 0.45* 0.54* 0.40*  CALCIUM 9.0 9.0 8.9 9.2 9.5  MG  --   --  1.8 1.8  --   PHOS  --   --  3.3 3.0  --     GFR: Estimated Creatinine Clearance: 93.6 mL/min (A) (by C-G formula based on SCr of 0.4 mg/dL (L)).  Liver Function Tests: Recent Labs  Lab 09/17/22 0310  AST 22  ALT 27  ALKPHOS 94  BILITOT 0.5  PROT 5.8*  ALBUMIN 1.9*   No results for input(s): "LIPASE", "AMYLASE" in the last 168 hours. No results for input(s): "AMMONIA" in the last 168 hours.  Coagulation Profile: No results for input(s): "INR", "PROTIME" in the last 168 hours.  Cardiac Enzymes: No results for input(s): "CKTOTAL", "CKMB", "CKMBINDEX", "TROPONINI" in the last 168 hours.  BNP (last 3 results) No results for input(s): "PROBNP" in the last 8760 hours.  Lipid Profile: No results for input(s): "CHOL", "HDL", "LDLCALC", "TRIG", "CHOLHDL", "LDLDIRECT" in the last 72 hours.  Thyroid Function Tests: No results for input(s): "TSH", "T4TOTAL", "FREET4", "T3FREE", "THYROIDAB" in the last 72 hours.  Anemia Panel: No results for input(s): "VITAMINB12", "FOLATE", "FERRITIN", "TIBC", "IRON", "RETICCTPCT" in the last 72 hours.  Urine analysis:    Component Value Date/Time   COLORURINE AMBER (A) 09/11/2022 0230   APPEARANCEUR CLOUDY (A) 09/11/2022 0230   LABSPEC 1.015 09/11/2022 0230   PHURINE 8.0 09/11/2022 0230   GLUCOSEU NEGATIVE 09/11/2022 0230   HGBUR LARGE (A) 09/11/2022 0230   BILIRUBINUR NEGATIVE 09/11/2022 0230   KETONESUR NEGATIVE 09/11/2022 0230   PROTEINUR 100 (A) 09/11/2022 0230   NITRITE POSITIVE (A) 09/11/2022 0230   LEUKOCYTESUR LARGE (A) 09/11/2022 0230    Sepsis Labs: Lactic Acid, Venous    Component Value Date/Time   LATICACIDVEN 2.7 (HH) 09/11/2022 2223    MICROBIOLOGY: Recent Results (from the past 240 hour(s))  Culture, Respiratory w Gram Stain     Status:  None   Collection Time: 09/18/22  7:30 AM   Specimen: Tracheal Aspirate  Result Value Ref Range Status   Specimen Description TRACHEAL ASPIRATE  Final   Special Requests NONE  Final   Gram Stain   Final    RARE WBC PRESENT, PREDOMINANTLY PMN RARE GRAM POSITIVE RODS Performed at [facility], [place] [address], [place], [place] [address]    Culture   Final    RARE PSEUDOMONAS AERUGINOSA FEW KLEBSIELLA PNEUMONIAE    Report Status 09/21/2022 FINAL  Final   Organism ID, Bacteria PSEUDOMONAS AERUGINOSA  Final   Organism ID, Bacteria KLEBSIELLA PNEUMONIAE  Final      Susceptibility   Klebsiella pneumoniae - MIC*    AMPICILLIN >=32 RESISTANT Resistant     CEFAZOLIN <=4 SENSITIVE Sensitive     CEFEPIME <=0.12 SENSITIVE Sensitive     CEFTAZIDIME <=1 SENSITIVE Sensitive     CEFTRIAXONE <=0.25 SENSITIVE Sensitive     CIPROFLOXACIN <=0.25 SENSITIVE Sensitive     GENTAMICIN <=1 SENSITIVE Sensitive     IMIPENEM <=0.25 SENSITIVE Sensitive     TRIMETH/SULFA <=20 SENSITIVE Sensitive     AMPICILLIN/SULBACTAM 16 INTERMEDIATE Intermediate     PIP/TAZO 16 SENSITIVE Sensitive     * FEW KLEBSIELLA PNEUMONIAE   Pseudomonas aeruginosa - MIC*    CEFTAZIDIME <=1 SENSITIVE Sensitive     CIPROFLOXACIN <=0.25 SENSITIVE Sensitive     GENTAMICIN <=1 SENSITIVE Sensitive     IMIPENEM 2 SENSITIVE Sensitive     PIP/TAZO <=4 SENSITIVE Sensitive  CEFEPIME 2 SENSITIVE Sensitive     * RARE PSEUDOMONAS AERUGINOSA    RADIOLOGY STUDIES/RESULTS: No results found.   LOS: 13 days   Jeoffrey Massed, MD  [facility]    To contact the attending provider between 7A-7P or the covering provider during after hours 7P-7A, please log into the web site [email or web address] and access using universal [place] password for that web site. If you do not have the password, please call the hospital operator.  09/23/2022, 12:02 PM

## 2022-09-24 ENCOUNTER — Encounter (HOSPITAL_COMMUNITY): Payer: Self-pay | Admitting: Pulmonary Disease

## 2022-09-24 DIAGNOSIS — A419 Sepsis, unspecified organism: Secondary | ICD-10-CM | POA: Diagnosis not present

## 2022-09-24 DIAGNOSIS — G9340 Encephalopathy, unspecified: Secondary | ICD-10-CM | POA: Diagnosis not present

## 2022-09-24 DIAGNOSIS — J9621 Acute and chronic respiratory failure with hypoxia: Secondary | ICD-10-CM | POA: Diagnosis not present

## 2022-09-24 DIAGNOSIS — J690 Pneumonitis due to inhalation of food and vomit: Secondary | ICD-10-CM | POA: Diagnosis not present

## 2022-09-24 LAB — GLUCOSE, CAPILLARY
Glucose-Capillary: 127 mg/dL — ABNORMAL HIGH (ref 70–99)
Glucose-Capillary: 122 mg/dL — ABNORMAL HIGH (ref 70–99)
Glucose-Capillary: 68 mg/dL — ABNORMAL LOW (ref 70–99)
Glucose-Capillary: 105 mg/dL — ABNORMAL HIGH (ref 70–99)
Glucose-Capillary: 142 mg/dL — ABNORMAL HIGH (ref 70–99)
Glucose-Capillary: 132 mg/dL — ABNORMAL HIGH (ref 70–99)
Glucose-Capillary: 144 mg/dL — ABNORMAL HIGH (ref 70–99)

## 2022-09-24 MED ORDER — SODIUM BICARBONATE 650 MG PO TABS
650.0000 mg | ORAL_TABLET | Freq: Once | ORAL | Status: AC
  Administered 2022-09-24: 650 mg
  Filled 2022-09-24: qty 1

## 2022-09-24 MED ORDER — DEXTROSE 50 % IV SOLN
12.5000 g | INTRAVENOUS | Status: AC
  Administered 2022-09-24: 12.5 g via INTRAVENOUS
  Filled 2022-09-24: qty 50

## 2022-09-24 MED ORDER — SODIUM BICARBONATE 650 MG PO TABS
650.0000 mg | ORAL_TABLET | Freq: Once | ORAL | Status: DC
  Filled 2022-09-24: qty 1

## 2022-09-24 MED ORDER — PANCRELIPASE (LIP-PROT-AMYL) 10440-39150 UNITS PO TABS
20880.0000 [IU] | ORAL_TABLET | Freq: Once | ORAL | Status: AC
  Administered 2022-09-24: 20880 [IU]
  Filled 2022-09-24: qty 2

## 2022-09-24 MED ORDER — PANCRELIPASE (LIP-PROT-AMYL) 10440-39150 UNITS PO TABS
20880.0000 [IU] | ORAL_TABLET | Freq: Once | ORAL | Status: DC
  Filled 2022-09-24: qty 2

## 2022-09-24 NOTE — Progress Notes (Signed)
Attempted to unclog PEG tube per unclogging protocol and tube will still not flush nor pull back therefore reordered 2nd dose of unclogging protocol meds and will administer when arrives from pharmacy. CBG also 68 therefore will administer dextrose via IV per protocol. This RN sent Dr. Sloan Leiter secure chat and made him aware of all mentioned above.

## 2022-09-24 NOTE — Progress Notes (Signed)
Called to assess clogged G-tube. Unknown time or location of placement. Unsuccessful unclog attempts per RN by protocol.  Tube seen in LUQ, 20Fr pull through style. After using declogging tool and multiple flushes using tapered 59mL syringe, tube now flushes easily. Showed RN how to use should tube clog again. RN able to give meds without difficulty.  Ascencion Dike PA-C Interventional Radiology 09/24/2022 12:32 PM

## 2022-09-24 NOTE — Progress Notes (Signed)
Inner cannulas for trach ordered through Network engineer. RN aware.

## 2022-09-24 NOTE — Progress Notes (Signed)
Peg Tube unclogged by IR PA and feedings restarted. CBG rechecked and is 105.

## 2022-09-24 NOTE — Progress Notes (Signed)
Nutrition Follow-up  DOCUMENTATION CODES:   Non-severe (moderate) malnutrition in context of chronic illness  INTERVENTION:  Continue EN via G-tube: Glucerna 1.5 at 60 mL/hr (1440 mL total volume) Continue 112mL free water flush q 4hrs Continue 1 packet Juven BID via PEG, provides 95 calories, 2.5 grams of protein (collagen), 9.8 grams of carbohydrate (3 grams sugar) and micronutrients/packet to support wound healing  NUTRITION DIAGNOSIS:  Moderate Malnutrition related to chronic illness as evidenced by mild muscle depletion, mild fat depletion.  GOAL:  Patient will meet greater than or equal to 90% of their needs -progressing  MONITOR:  TF tolerance, I & O's, Labs  REASON FOR ASSESSMENT:  Follow Up  Enteral/tube feeding initiation and management  ASSESSMENT:  Pt with hx of TBI, aphasia, chronic trach and PEG and indwelling Foley presented from [facility] to ED with hematuria.  Brief interruption in EN due to clogged g-tube. Successfully cleared by IR. Pt continues to tolerate EN at goal rate of Glucerna 1.5 at 60 mL/hr (1440 mL total volume) with Juven BID and 242mL free water flush q 8hrs to provide 2350kcal (28.6kcal/kg), 124g protein (1.5g/kg) and 1647mL free water (20.55mL/kg). Pt meeting estimated protein and calorie needs to support wound healing. Last known BM 9/27. Of note, Na slightly elevated at 146 on 9/27. FWF recently decreased. Consider rechecking Na and increasing FWF to 258mL q 6hrs to provide a daily total of 1866mL free water (14mL/kg).  Labs reviewed: Na:146, BG<175, Cr:0.40, BUN:29.  Medications reviewed and include: novolog, semglee, synthroid  Diet Order:   Diet Order             Diet NPO time specified  Diet effective now                   EDUCATION NEEDS:  Not appropriate for education at this time  Skin:  Skin Assessment: Reviewed RN Assessment  Last BM:  9/27  Height:  Ht Readings from Last 1 Encounters:  09/11/22 5' 11.75" (1.822 m)    Weight:  Wt Readings from Last 1 Encounters:  09/24/22 82.1 kg    Ideal Body Weight:  80.9 kg  BMI:  Body mass index is 24.72 kg/m.  Estimated Nutritional Needs:   Kcal:  2200-2400kcal  Protein:  115-130g  Fluid:  1880-2462mL (23-66mL/kg)  Candise Bowens, MS, RD, LDN, CNSC See AMiON for contact information

## 2022-09-24 NOTE — Progress Notes (Signed)
PROGRESS NOTE        PATIENT DETAILS Name: Joshua Haley Age: 71 y.o. Sex: male Date of Birth: 1951/04/12 Admit Date: 09/10/2022 Admitting Physician Joshua Sanes, MD MVH:QIONGE, Joshua Haley Medical  Brief Summary: Patient is a 71 y.o.  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  Unfortunately-Hospital course complicated by development of aspiration PNA.  Significant events: 9/14>> admit to ICU-septic shock. 9/18>> transfer to [facility]  Significant studies: 9/14>> CXR: No PNA 9/15>> CT head: No acute intracranial abnormality, previous right frontotemporal craniotomy. 9/21>> CT renal stone: Multifocal bibasilar PNA, bilateral nonobstructive nephrolithiasis. 9/25>> CXR: New bilateral pulmonary opacities.  Significant microbiology data: 9/14>> COVID/influenza PCR: Negative 9/14>> blood culture: Proteus 9/15>> urine culture: Proteus 9/17>> blood culture: No growth 9/22>> tracheal aspirate: Pseudomonas/Klebsiella  Procedures: None  Consults: PCCM  Subjective: PEG tube clogged earlier-was unclogged by IR team.  No major events overnight.  Objective: Vitals: Blood pressure 131/76, pulse 94, temperature 98.4 F (36.9 C), temperature source Axillary, resp. rate 19, height 5' 11.75" (1.822 m), weight 82.1 kg, SpO2 95 %.   Exam: Gen Exam:not in any distress HEENT:atraumatic, normocephalic Chest: B/L clear to auscultation anteriorly CVS:S1S2 regular Abdomen:soft non tender, non distended Extremities:no edema Skin: no rash   Pertinent Labs/Radiology:    Latest Ref Rng & Units 09/20/2022    6:24 AM 09/19/2022    7:32 AM 09/18/2022    2:18 AM  CBC  WBC 4.0 - 10.5 K/uL 10.0  10.1  10.5   Hemoglobin 13.0 - 17.0 g/dL 95.2   84.1  32.4   Hematocrit 39.0 - 52.0 % 41.1  40.3  40.7   Platelets 150 - 400 K/uL 561  445  348     Lab Results  Component Value Date   NA 146 (H) 09/23/2022   K 3.9 09/23/2022   CL 109 09/23/2022   CO2 28 09/23/2022      Assessment/Plan: Septic shock due to Proteus bacteremia/complicated UTI Sepsis physiology resolved-completed a course of Rocephin.  UTI likely related to chronic indwelling Foley catheter with recent Foley trauma.    Acute on chronic hypoxic respiratory failure due to aspiration pneumonia Improved-stable on 40% FiO2 via trach collar.  Will complete 14 days of empiric IV antibiotics on 9/28.  Unfortunately-he will remain at risk for aspiration episodes in the future.   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  Nonverbal at baseline. Continue Keppra/Provigil/amantadine/Zoloft.  EEG on 9/20 without seizures.  Chronic indwelling Foley catheter Urethral bleeding due to Foley trauma Patient pulled Foley catheter prior to admission resulting in bleeding.  Urethral bleeding has resolved-Foley catheter has been replaced.  Oropharyngeal dysphagia in the setting of TBI PEG tube clogged this morning-unclogged by IR this afternoon.  Hypothyroidism Continue Cytomel/Synthroid  DM-2 CBG stable-continue Semglee 25 units daily, 8 units of NovoLog every 4 hours and SSI.  Oral hypoglycemics remain on hold  Recent Labs    09/24/22 0817 09/24/22 1118 09/24/22 1225  GLUCAP 122* 68* 105*     HTN BP stable-all antihypertensives remain on hold.  Resume when able.  GERD PPI  Nutrition Status: Nutrition Problem: Moderate Malnutrition Etiology:  chronic illness Signs/Symptoms: mild muscle depletion, mild fat depletion Interventions: Refer to RD note for recommendations   Pressure Ulcer: Pressure Injury 09/11/22 Sacrum Mid Stage 2 -  Partial thickness  loss of dermis presenting as a shallow open injury with a red, pink wound bed without slough. masd with patchy areas of stage 2 pressure injury on sacrum - 15 cm X 12 cm (Active)  09/11/22 0345  Location: Sacrum  Location Orientation: Mid  Staging: Stage 2 -  Partial thickness loss of dermis presenting as a shallow open injury with a red, pink wound bed without slough.  Wound Description (Comments): masd with patchy areas of stage 2 pressure injury on sacrum - 15 cm X 12 cm  Present on Admission: Yes  Dressing Type Foam - Lift dressing to assess site every shift 09/23/22 2000     Pressure Injury 09/11/22 Perineum Left Stage 2 -  Partial thickness loss of dermis presenting as a shallow open injury with a red, pink wound bed without slough. fissure on left inner cheek 2 cm X 0.5 cm (Active)  09/11/22 0350  Location: Perineum  Location Orientation: Left  Staging: Stage 2 -  Partial thickness loss of dermis presenting as a shallow open injury with a red, pink wound bed without slough.  Wound Description (Comments): fissure on left inner cheek 2 cm X 0.5 cm  Present on Admission: Yes  Dressing Type Foam - Lift dressing to assess site every shift 09/23/22 1600    BMI/Obesit: Estimated body mass index is 24.72 kg/m as calculated from the following:   Height as of this encounter: 5' 11.75" (1.822 m).   Weight as of this encounter: 82.1 kg.   Code status:   Code Status: DNR   DVT Prophylaxis: enoxaparin (LOVENOX) injection 40 mg Start: 09/12/22 1230 SCDs Start: 09/10/22 2037    Family Communication: Spouse-Joshua Haley [phone] 9/27   Disposition Plan: Status is: Inpatient Remains inpatient appropriate because: Resolving aspiration pneumonia-on IV antibiotics   Planned Discharge Destination:Skilled nursing facility hopefully on 9/29.   Diet: Diet Order             Diet NPO time specified  Diet effective now                     Antimicrobial agents: Anti-infectives  (From admission, onward)    Start     Dose/Rate Route Frequency Ordered Stop   09/19/22 2100  vancomycin (VANCOCIN) IVPB 1000 mg/200 mL premix  Status:  Discontinued        1,000 mg 200 mL/hr over 60 Minutes Intravenous Every 12 hours 09/19/22 0814 09/21/22 0958   09/19/22 0900  vancomycin (VANCOREADY) IVPB 1500 mg/300 mL        1,500 mg 150 mL/hr over 120 Minutes Intravenous  Once 09/19/22 0814 09/19/22 1346   09/18/22 1215  metroNIDAZOLE (FLAGYL) IVPB 500 mg        500 mg 100 mL/hr over 60 Minutes Intravenous 2 times daily 09/18/22 1149     09/18/22 0900  ceFEPIme (MAXIPIME) 2 g in sodium chloride 0.9 % 100 mL IVPB        2 g 200 mL/hr over 30 Minutes Intravenous Every 8 hours 09/18/22 0802     09/18/22 0900  vancomycin (VANCOREADY) IVPB 1500 mg/300 mL        1,500 mg 150 mL/hr over 120 Minutes Intravenous  Once 09/18/22 0802 09/18/22 1448   09/11/22 1400  cefTRIAXone (ROCEPHIN) 2 g in sodium chloride 0.9 %  100 mL IVPB  Status:  Discontinued        2 g 200 mL/hr over 30 Minutes Intravenous Every 24 hours 09/11/22 1239 09/18/22 0728   09/11/22 1100  vancomycin (VANCOCIN) IVPB 1000 mg/200 mL premix  Status:  Discontinued        1,000 mg 200 mL/hr over 60 Minutes Intravenous Every 12 hours 09/10/22 2155 09/10/22 2200   09/11/22 1100  vancomycin (VANCOREADY) IVPB 750 mg/150 mL  Status:  Discontinued        750 mg 150 mL/hr over 60 Minutes Intravenous Every 12 hours 09/10/22 2200 09/11/22 1157   09/11/22 0300  ceFEPIme (MAXIPIME) 2 g in sodium chloride 0.9 % 100 mL IVPB  Status:  Discontinued        2 g 200 mL/hr over 30 Minutes Intravenous Every 8 hours 09/10/22 2159 09/11/22 1239   09/10/22 2000  vancomycin (VANCOREADY) IVPB 1500 mg/300 mL        1,500 mg 150 mL/hr over 120 Minutes Intravenous  Once 09/10/22 1931 09/10/22 2313   09/10/22 1915  ceFEPIme (MAXIPIME) 2 g in sodium chloride 0.9 % 100 mL IVPB        2 g 200 mL/hr over 30 Minutes Intravenous  Once 09/10/22 1911 09/10/22  1958   09/10/22 1915  vancomycin (VANCOCIN) IVPB 1000 mg/200 mL premix  Status:  Discontinued        1,000 mg 200 mL/hr over 60 Minutes Intravenous  Once 09/10/22 1911 09/10/22 1931        MEDICATIONS: Scheduled Meds:  amantadine  100 mg Per Tube Daily   Chlorhexidine Gluconate Cloth  6 each Topical Daily   enoxaparin (LOVENOX) injection  40 mg Subcutaneous Daily   free water  200 mL Per Tube Q8H   guaiFENesin  5 mL Per Tube Daily   insulin aspart  0-20 Units Subcutaneous Q4H   insulin aspart  8 Units Subcutaneous Q4H   insulin glargine-yfgn  25 Units Subcutaneous Daily   levETIRAcetam  250 mg Per Tube BID   levothyroxine  100 mcg Per Tube QAC breakfast   liothyronine  7.5 mcg Per Tube BID   liver oil-zinc oxide   Topical BID   modafinil  50 mg Per Tube Daily   nutrition supplement (JUVEN)  1 packet Per Tube BID BM   mouth rinse  15 mL Mouth Rinse 4 times per day   pantoprazole  40 mg Per Tube Daily   sertraline  25 mg Per Tube Daily   sodium chloride flush  10-40 mL Intracatheter Q12H   Continuous Infusions:  ceFEPime (MAXIPIME) IV Stopped (09/24/22 7048)   feeding supplement (GLUCERNA 1.5 CAL) 60 mL/hr at 09/24/22 1225   metronidazole Stopped (09/24/22 1011)   PRN Meds:.acetaminophen (TYLENOL) oral liquid 160 mg/5 mL, docusate sodium, fentaNYL (SUBLIMAZE) injection, ondansetron (ZOFRAN) IV, mouth rinse, polyethylene glycol, sodium chloride flush, white petrolatum   I have personally reviewed following labs and imaging studies  LABORATORY DATA: CBC: Recent Labs  Lab 09/18/22 0218 09/19/22 0732 09/20/22 0624  WBC 10.5 10.1 10.0  HGB 12.7* 13.0 12.6*  HCT 40.7 40.3 41.1  MCV 87.2 88.2 89.5  PLT 348 445* 561*     Basic Metabolic Panel: Recent Labs  Lab 09/18/22 0218 09/19/22 0732 09/20/22 0624 09/23/22 0402  NA 140 143 143 146*  K 4.1 4.2 3.7 3.9  CL 108 113* 109 109  CO2 24 24 25 28   GLUCOSE 128* 147* 149* 124*  BUN 23 27* 31* 29*  CREATININE 0.36*  0.45* 0.54* 0.40*  CALCIUM 9.0 8.9 9.2 9.5  MG  --  1.8 1.8  --   PHOS  --  3.3 3.0  --      GFR: Estimated Creatinine Clearance: 93.6 mL/min (A) (by C-G formula based on SCr of 0.4 mg/dL (L)).  Liver Function Tests: No results for input(s): "AST", "ALT", "ALKPHOS", "BILITOT", "PROT", "ALBUMIN" in the last 168 hours.  No results for input(s): "LIPASE", "AMYLASE" in the last 168 hours. No results for input(s): "AMMONIA" in the last 168 hours.  Coagulation Profile: No results for input(s): "INR", "PROTIME" in the last 168 hours.  Cardiac Enzymes: No results for input(s): "CKTOTAL", "CKMB", "CKMBINDEX", "TROPONINI" in the last 168 hours.  BNP (last 3 results) No results for input(s): "PROBNP" in the last 8760 hours.  Lipid Profile: No results for input(s): "CHOL", "HDL", "LDLCALC", "TRIG", "CHOLHDL", "LDLDIRECT" in the last 72 hours.  Thyroid Function Tests: No results for input(s): "TSH", "T4TOTAL", "FREET4", "T3FREE", "THYROIDAB" in the last 72 hours.  Anemia Panel: No results for input(s): "VITAMINB12", "FOLATE", "FERRITIN", "TIBC", "IRON", "RETICCTPCT" in the last 72 hours.  Urine analysis:    Component Value Date/Time   COLORURINE AMBER (A) 09/11/2022 0230   APPEARANCEUR CLOUDY (A) 09/11/2022 0230   LABSPEC 1.015 09/11/2022 0230   PHURINE 8.0 09/11/2022 0230   GLUCOSEU NEGATIVE 09/11/2022 0230   HGBUR LARGE (A) 09/11/2022 0230   BILIRUBINUR NEGATIVE 09/11/2022 0230   KETONESUR NEGATIVE 09/11/2022 0230   PROTEINUR 100 (A) 09/11/2022 0230   NITRITE POSITIVE (A) 09/11/2022 0230   LEUKOCYTESUR LARGE (A) 09/11/2022 0230    Sepsis Labs: Lactic Acid, Venous    Component Value Date/Time   LATICACIDVEN 2.7 (HH) 09/11/2022 2223    MICROBIOLOGY: Recent Results (from the past 240 hour(s))  Culture, Respiratory w Gram Stain     Status: None   Collection Time: 09/18/22  7:30 AM   Specimen: Tracheal Aspirate  Result Value Ref Range Status   Specimen Description  TRACHEAL ASPIRATE  Final   Special Requests NONE  Final   Gram Stain   Final    RARE WBC PRESENT, PREDOMINANTLY PMN RARE GRAM POSITIVE RODS Performed at [facility], [address] [place], [place] [address]    Culture   Final    RARE PSEUDOMONAS AERUGINOSA FEW KLEBSIELLA PNEUMONIAE    Report Status 09/21/2022 FINAL  Final   Organism ID, Bacteria PSEUDOMONAS AERUGINOSA  Final   Organism ID, Bacteria KLEBSIELLA PNEUMONIAE  Final      Susceptibility   Klebsiella pneumoniae - MIC*    AMPICILLIN >=32 RESISTANT Resistant     CEFAZOLIN <=4 SENSITIVE Sensitive     CEFEPIME <=0.12 SENSITIVE Sensitive     CEFTAZIDIME <=1 SENSITIVE Sensitive     CEFTRIAXONE <=0.25 SENSITIVE Sensitive     CIPROFLOXACIN <=0.25 SENSITIVE Sensitive     GENTAMICIN <=1 SENSITIVE Sensitive     IMIPENEM <=0.25 SENSITIVE Sensitive     TRIMETH/SULFA <=20 SENSITIVE Sensitive     AMPICILLIN/SULBACTAM 16 INTERMEDIATE Intermediate     PIP/TAZO 16 SENSITIVE Sensitive     * FEW KLEBSIELLA PNEUMONIAE   Pseudomonas aeruginosa - MIC*    CEFTAZIDIME <=1 SENSITIVE Sensitive     CIPROFLOXACIN <=0.25 SENSITIVE Sensitive     GENTAMICIN <=1 SENSITIVE Sensitive     IMIPENEM 2 SENSITIVE Sensitive     PIP/TAZO <=4 SENSITIVE Sensitive     CEFEPIME 2 SENSITIVE Sensitive     * RARE PSEUDOMONAS AERUGINOSA    RADIOLOGY  STUDIES/RESULTS: No results found.   LOS: 14 days   Jeoffrey Massed, MD  [facility]    To contact the attending provider between 7A-7P or the covering provider during after hours 7P-7A, please log into the web site [email or web address] and access using universal [place] password for that web site. If you do not have the password, please call the hospital operator.  09/24/2022, 12:31 PM

## 2022-09-24 NOTE — TOC Progression Note (Addendum)
Transition of Care [facility]) - Progression Note    Patient Details  Name: Joshua Haley MRN: [id number] Date of Birth: 1951-04-18  Transition of Care [facility]) CM/SW [place], LCSW Phone Number: 09/24/2022, 9:30 AM  Clinical Narrative:    CSW received call from [place], Malachy Mood, requesting update and for CSW to fax over patient's discharge summary to f. [phone].   She will arrange transportation for tomorrow at 3pm pending medical stability.   Left message for [facility] to ensure they can accept patient on 40% FiO2. Angie stated they do not need an FL2, only the DC Summary emailed.   CSW left vm for patient's spouse.     Expected Discharge Plan: [place] Barriers to Discharge: Continued Medical Work up  Expected Discharge Plan and Services Expected Discharge Plan: [place] In-house Referral: Clinical Social Work   Post Acute Care Choice: [place] Living arrangements for the past 2 months: [place] (and [facility])                                       Social Determinants of Health (SDOH) Interventions    Readmission Risk Interventions     No data to display

## 2022-09-25 DIAGNOSIS — J9621 Acute and chronic respiratory failure with hypoxia: Secondary | ICD-10-CM | POA: Diagnosis not present

## 2022-09-25 DIAGNOSIS — A419 Sepsis, unspecified organism: Secondary | ICD-10-CM | POA: Diagnosis not present

## 2022-09-25 DIAGNOSIS — J690 Pneumonitis due to inhalation of food and vomit: Secondary | ICD-10-CM | POA: Diagnosis not present

## 2022-09-25 DIAGNOSIS — G9340 Encephalopathy, unspecified: Secondary | ICD-10-CM | POA: Diagnosis not present

## 2022-09-25 LAB — GLUCOSE, CAPILLARY
Glucose-Capillary: 121 mg/dL — ABNORMAL HIGH (ref 70–99)
Glucose-Capillary: 99 mg/dL (ref 70–99)
Glucose-Capillary: 111 mg/dL — ABNORMAL HIGH (ref 70–99)

## 2022-09-25 MED ORDER — INSULIN GLARGINE 100 UNIT/ML ~~LOC~~ SOLN: 25.0000 [IU] | Freq: Every day | SUBCUTANEOUS | 11 refills | Status: DC

## 2022-09-25 MED ORDER — INSULIN ASPART 100 UNIT/ML IJ SOLN: 0.0000 [IU] | INTRAMUSCULAR | Status: DC

## 2022-09-25 MED ORDER — INSULIN ASPART 100 UNIT/ML IJ SOLN: INTRAMUSCULAR | 11 refills | Status: DC

## 2022-09-25 MED ORDER — JUVEN PO PACK: 1.0000 | PACK | Freq: Two times a day (BID) | ORAL | 0 refills | Status: DC

## 2022-09-25 MED ORDER — FREE WATER: 200.0000 mL | Freq: Three times a day (TID) | Status: DC

## 2022-09-25 NOTE — TOC Transition Note (Signed)
Transition of Care [facility]) - CM/SW Discharge Note   Patient Details  Name: Joshua Haley MRN: [id number] Date of Birth: January 30, 1951  Transition of Care [facility]) CM/SW Contact:  Benard Halsted, LCSW Phone Number: 09/25/2022, 12:02 PM   Clinical Narrative:    Patient will DC to: [facility] Anticipated DC date: 09/25/22 Family notified: Spouse Transport by: [place] [employer] ambulance vendor 2pm   Per MD patient ready for DC to [facility]. RN to call report prior to discharge [phone]). RN, patient, patient's family, and facility notified of DC. Discharge Summary and FL2 sent to facility. DC packet on chart. Ambulance transport requested for patient.   CSW will sign off for now as social work intervention is no longer needed. Please consult [place] again if new needs arise.     Final next level of care: Skilled Nursing Facility Barriers to Discharge: Barriers Resolved   Patient Goals and CMS Choice   CMS Medicare.gov Compare Post Acute Care list provided to:: Patient Represenative (must comment) Choice offered to / list presented to : Spouse  Discharge Placement   Existing PASRR number confirmed : 09/25/22          Patient chooses bed at: [facility] Patient to be transferred to facility by: [place] Name of family member notified: [facility] Patient and family notified of of transfer: 09/25/22  Discharge Plan and Services In-house Referral: Clinical Social Work   Post Acute Care Choice: [place]                               Social Determinants of Health (SDOH) Interventions     Readmission Risk Interventions     No data to display

## 2022-09-25 NOTE — Plan of Care (Signed)
  Problem: Education: Goal: Knowledge of General Education information will improve Description: Including pain rating scale, medication(s)/side effects and non-pharmacologic comfort measures Outcome: Progressing   Problem: Health Behavior/Discharge Planning: Goal: Ability to manage health-related needs will improve Outcome: Progressing   Problem: Clinical Measurements: Goal: Ability to maintain clinical measurements within normal limits will improve Outcome: Progressing Goal: Will remain free from infection Outcome: Progressing Goal: Diagnostic test results will improve Outcome: Progressing Goal: Respiratory complications will improve Outcome: Progressing Goal: Cardiovascular complication will be avoided Outcome: Progressing   Problem: Activity: Goal: Risk for activity intolerance will decrease Outcome: Progressing   Problem: Nutrition: Goal: Adequate nutrition will be maintained Outcome: Progressing   Problem: Coping: Goal: Level of anxiety will decrease Outcome: Progressing   Problem: Elimination: Goal: Will not experience complications related to bowel motility Outcome: Progressing Goal: Will not experience complications related to urinary retention Outcome: Progressing   Problem: Pain Managment: Goal: General experience of comfort will improve Outcome: Progressing   Problem: Safety: Goal: Ability to remain free from injury will improve Outcome: Progressing   Problem: Skin Integrity: Goal: Risk for impaired skin integrity will decrease Outcome: Progressing   Problem: Safety: Goal: Non-violent Restraint(s) Outcome: Progressing   Problem: Education: Goal: Ability to describe self-care measures that may prevent or decrease complications (Diabetes Survival Skills Education) will improve Outcome: Progressing Goal: Individualized Educational Video(s) Outcome: Progressing   Problem: Coping: Goal: Ability to adjust to condition or change in health will  improve Outcome: Progressing   Problem: Fluid Volume: Goal: Ability to maintain a balanced intake and output will improve Outcome: Progressing   Problem: Health Behavior/Discharge Planning: Goal: Ability to identify and utilize available resources and services will improve Outcome: Progressing Goal: Ability to manage health-related needs will improve Outcome: Progressing   Problem: Metabolic: Goal: Ability to maintain appropriate glucose levels will improve Outcome: Progressing   Problem: Nutritional: Goal: Maintenance of adequate nutrition will improve Outcome: Progressing Goal: Progress toward achieving an optimal weight will improve Outcome: Progressing   Problem: Skin Integrity: Goal: Risk for impaired skin integrity will decrease Outcome: Progressing   Problem: Tissue Perfusion: Goal: Adequacy of tissue perfusion will improve Outcome: Progressing   

## 2022-09-25 NOTE — TOC Progression Note (Signed)
Transition of Care [facility]) - Progression Note    Patient Details  Name: STORY VANVRANKEN MRN: [id number] Date of Birth: Jan 15, 1951  Transition of Care [facility]) CM/SW [place], LCSW Phone Number: 09/25/2022, 10:11 AM  Clinical Narrative:    CSW emailed DC Summary to [facility] as requested and faxed it to [place] with [place] [employer]. They will arrange transportation.    Expected Discharge Plan: [place] Barriers to Discharge: Continued Medical Work up  Expected Discharge Plan and Services Expected Discharge Plan: [place] In-house Referral: Clinical Social Work   Post Acute Care Choice: [place] Living arrangements for the past 2 months: [place] (and [facility]) Expected Discharge Date: 09/25/22                                     Social Determinants of Health (SDOH) Interventions    Readmission Risk Interventions     No data to display

## 2022-09-25 NOTE — Discharge Summary (Signed)
PATIENT DETAILS Name: Joshua LackJames R Kritikos Age: 71 y.o. Sex: male Date of Birth: 01-Jun-1951 MRN: [id number]. Admitting Physician: Charlotte SanesNicole Gonzales, MD WUJ:[id number]:[facility]  Admit Date: 09/10/2022 Discharge date: 09/25/2022  Recommendations for Outpatient Follow-up:  Follow up with PCP in 1-2 weeks Please obtain CMP/CBC in one week   Admitted From:  SNF  Disposition: Skilled nursing facility   Discharge Condition: fair  CODE STATUS:   Code Status: DNR   Diet recommendation:  Diet Order             Diet - low sodium heart healthy           Diet NPO time specified  Diet effective now                    Brief Summary: Patient is a 71 y.o.  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  Unfortunately-Hospital course complicated by development of aspiration PNA.   Significant events: 9/14>> admit to ICU-septic shock. 9/18>> [facility]   Significant studies: 9/14>> CXR: No PNA 9/15>> CT head: No acute intracranial abnormality, previous right frontotemporal craniotomy. 9/21>> CT renal stone: Multifocal bibasilar PNA, bilateral nonobstructive nephrolithiasis. 9/25>> CXR: New bilateral pulmonary opacities.   Significant microbiology data: 9/14>> COVID/influenza PCR: Negative 9/14>> blood culture: Proteus 9/15>> urine culture: Proteus 9/17>> blood culture: No growth 9/22>> tracheal aspirate: Pseudomonas/Klebsiella   Procedures: None   Consults: PCCM  Brief Hospital Course: Septic shock due to Proteus bacteremia/complicated UTI Sepsis physiology resolved-completed a course of Rocephin.  UTI likely related to chronic indwelling Foley catheter with recent Foley trauma.     Acute on chronic hypoxic respiratory failure due  to aspiration pneumonia Improved-titrated down to 35% FiO2 via trach collar.  Completed 14 days of empiric IV antibiotics with cefepime on 9/28.  Continue to maintain aspiration precautions per unfortunately he will remain at risk for aspiration episodes in the future.     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  Nonverbal at baseline. Continue Keppra/Provigil/amantadine/Zoloft.  EEG on 9/20 without seizures.   Chronic indwelling Foley catheter Urethral bleeding due to Foley trauma Patient pulled Foley catheter prior to admission resulting in bleeding.  Urethral bleeding has resolved-Foley catheter has been replaced.   Oropharyngeal dysphagia in the setting of TBI PEG tube clogged on 9/28-unclogged by IR-and working well.     Hypothyroidism Continue Cytomel/Synthroid   DM-2 CBG stable-continue Semglee 25 units daily, and SSI.    Recent Labs    09/24/22 2313 09/25/22 0335 09/25/22 0804  GLUCAP 144* 121* 99     Pressure Ulcer: Pressure Injury 09/11/22 Sacrum Mid Stage 2 -  Partial thickness loss of dermis presenting as a shallow open injury with a red, pink wound bed without slough. masd with patchy areas of stage 2 pressure injury on sacrum - 15 cm X 12 cm (Active)  09/11/22 0345  Location: Sacrum  Location Orientation: Mid  Staging: Stage 2 -  Partial thickness loss of dermis presenting as a shallow open injury with a red, pink wound bed without slough.  Wound Description (Comments): masd with patchy areas of stage 2 pressure injury on sacrum - 15 cm X 12 cm  Present on Admission: Yes  Dressing Type Other (Comment) 09/24/22 1938  Pressure Injury 09/11/22 Perineum Left Stage 2 -  Partial thickness loss of dermis presenting as a shallow open injury with a red, pink wound bed without slough. fissure on left inner cheek 2 cm X 0.5 cm (Active)  09/11/22 0350   Location: Perineum  Location Orientation: Left  Staging: Stage 2 -  Partial thickness loss of dermis presenting as a shallow open injury with a red, pink wound bed without slough.  Wound Description (Comments): fissure on left inner cheek 2 cm X 0.5 cm  Present on Admission: Yes  Dressing Type Other (Comment) 09/24/22 1938   Nutrition Status: Nutrition Problem: Moderate Malnutrition Etiology: chronic illness Signs/Symptoms: mild muscle depletion, mild fat depletion Interventions: Refer to RD note for recommendations   BMI: Estimated body mass index is 24.57 kg/m as calculated from the following:   Height as of this encounter: 5' 11.75" (1.822 m).   Weight as of this encounter: 81.6 kg.    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, sequela (HCC)   Tracheostomy status (HCC)   Urethral bleeding   Hyperglycemia   Acute encephalopathy   Sacral decubitus ulcer   CAD (coronary artery disease)   Malnutrition of moderate degree   Discharge Instructions:  Activity:  As tolerated with Full fall precautions use walker/cane & assistance as needed   Discharge Instructions     Diet - low sodium heart healthy   Complete by: As directed    Discharge instructions   Complete by: As directed    Follow with Primary MD  [facility] in 1-2 weeks  Check CBG every 4 hours  Strict aspiration precautions.  Please get a complete blood count and chemistry panel checked by your Primary MD at your next visit, and again as instructed by your Primary MD.  Get Medicines reviewed and adjusted: Please take all your medications with you for your next visit with your  Primary MD  Laboratory/radiological data: Please request your Primary MD to go over all hospital tests and procedure/radiological results at the follow up, please ask your Primary MD to get all Hospital records sent to his/her office.  In some cases, they will be blood work, cultures and biopsy results pending at the time of your discharge. Please request that your primary care M.D. follows up on these results.  Also Note the following: If you experience worsening of your admission symptoms, develop shortness of breath, life threatening emergency, suicidal or homicidal thoughts you must seek medical attention immediately by calling 911 or calling your MD immediately  if symptoms less severe.  You must read complete instructions/literature along with all the possible adverse reactions/side effects for all the Medicines you take and that have been prescribed to you. Take any new Medicines after you have completely understood and accpet all the possible adverse reactions/side effects.   Do not drive when taking Pain medications or sleeping medications (Benzodaizepines)  Do not take more than prescribed Pain, Sleep and Anxiety Medications. It is not advisable to combine anxiety,sleep and pain medications without talking with your primary care practitioner  Special Instructions: If you have smoked or chewed Tobacco  in the last 2 yrs please stop  smoking, stop any regular Alcohol  and or any Recreational drug use.  Wear Seat belts while driving.  Please note: You were cared for by a hospitalist during your hospital stay. Once you are discharged, your primary care physician will handle any further medical issues. Please note that NO REFILLS for any discharge medications will be authorized once you are discharged, as it is imperative that you return to your primary care physician (or establish a relationship with a primary care physician if you do not have one) for your post hospital discharge needs so  that they can reassess your need for medications and monitor your lab values.   Discharge wound care:   Complete by: As directed    Apply Desitin to sacrum/buttocks BID and with each turning and cleaning session.  Leave foam dressing off, since it is trapping stool underneath.   Increase activity slowly   Complete by: As directed       Allergies as of 09/25/2022       Reactions   Morphine And Related    Penicillins Other (See Comments)   Statins Nausea And Vomiting        Medication List     STOP taking these medications    amLODipine 10 MG tablet Commonly known as: NORVASC   insulin regular 100 units/mL injection Commonly known as: NOVOLIN R   lisinopril 20 MG tablet Commonly known as: ZESTRIL   Metoprolol Tartrate 37.5 MG Tabs   terazosin 5 MG capsule Commonly known as: HYTRIN       TAKE these medications    acetaminophen 325 MG tablet Commonly known as: TYLENOL Give  per tube every 8 hours as needed for pain   amantadine 50 MG/5ML solution Commonly known as: SYMMETREL Place 100 mg into feeding tube daily.   ascorbic acid 500 MG tablet Commonly known as: VITAMIN C Give  per tube daily   Citrucel 500 MG Tabs Generic drug: Methylcellulose (Laxative) Place 500 mg into feeding tube 3 (three) times daily.   enoxaparin 40 MG/0.4ML injection Commonly known as: LOVENOX Inject 40 mg into the skin daily.   feeding supplement (GLUCERNA 1.5 CAL) Liqd Give 50mL/hr per tube by shift What changed: Another medication with the same name was added. Make sure you understand how and when to take each.   nutrition supplement (JUVEN) Pack Place 1 packet into feeding tube 2 (two) times daily between meals. What changed: You were already taking a medication with the same name, and this prescription was added. Make sure you understand how and when to take each.   ferrous sulfate 300 (60 Fe) MG/5ML syrup Place 300 mg into feeding tube 2 (two) times daily with  a meal.   free water Soln Place 200 mLs into feeding tube every 8 (eight) hours.   insulin aspart 100 UNIT/ML injection Commonly known as: novoLOG insulin aspart (novoLOG) injection 0-15 Units 0-15 Units, Subcutaneous, Every 4 hours,  CBG < 70: Implement Hypoglycemia Standing Orders and refer to Hypoglycemia Standing Orders sidebar report CBG 70 - 120: 0 units CBG 121 - 150: 2 units CBG 151 - 200: 3 units CBG 201 - 250: 5 units CBG 251 - 300: 8 units CBG 301 - 350: 11 units CBG 351 - 400: 15 units CBG > 400: call MD   insulin glargine 100 UNIT/ML injection Commonly known as: LANTUS Inject 0.25 mLs (25 Units total) into the skin daily. What changed: how much to take   lansoprazole 30 MG disintegrating tablet Commonly  known as: PREVACID SOLUTAB Give 30mg  per tube daily   levETIRAcetam 100 MG/ML solution Commonly known as: KEPPRA Place 250 mg into feeding tube every 12 (twelve) hours.   levothyroxine 100 MCG tablet Commonly known as: SYNTHROID Give 148mcg per tube daily   liothyronine 5 MCG tablet Commonly known as: CYTOMEL Give one and one half tablet (7.5mg ) per tube every 12 hours   modafinil 100 MG tablet Commonly known as: PROVIGIL Give 50mg  (one half tablet) per tube daily   multivitamin Tabs tablet Give 1 tablet per tube daily   omega-3 acid ethyl esters 1 g capsule Commonly known as: LOVAZA Give 1 g per tube daily   polyethylene glycol 17 g packet Commonly known as: MIRALAX / GLYCOLAX Give 17g per tube daily as needed for constipation   Pro-Stat AWC Liqd Place 30 mLs into feeding tube daily.   sertraline 25 MG tablet Commonly known as: ZOLOFT Give 25mg  per tube daily               Discharge Care Instructions  (From admission, onward)           Start     Ordered   09/25/22 0000  Discharge wound care:       Comments: Apply Desitin to sacrum/buttocks BID and with each turning and cleaning session.  Leave foam dressing off, since it is trapping  stool underneath.   09/25/22 0934            Allergies  Allergen Reactions   Morphine And Related    Penicillins Other (See Comments)   Statins Nausea And Vomiting     Other Procedures/Studies: DG Chest Port 1 View  Result Date: 09/21/2022 CLINICAL DATA:  Hypoxia. EXAM: PORTABLE CHEST 1 VIEW COMPARISON:  September 12, 2022 FINDINGS: Rounded opacity in the lateral left lung base is new. Mild opacity in the right mid lower lung is new. The tracheostomy tube remains in place. No pneumothorax. No other interval changes. IMPRESSION: New bilateral pulmonary opacities are worrisome for pneumonia or aspiration. Recommend short-term follow-up to ensure resolution. No other changes. Electronically Signed   By: Dorise Bullion III M.D.   On: 09/21/2022 08:08   CT RENAL STONE STUDY  Result Date: 09/17/2022 CLINICAL DATA:  Septic shock.  Low-grade fever. EXAM: CT ABDOMEN AND PELVIS WITHOUT CONTRAST TECHNIQUE: Multidetector CT imaging of the abdomen and pelvis was performed following the standard protocol without IV contrast. RADIATION DOSE REDUCTION: This exam was performed according to the departmental dose-optimization program which includes automated exposure control, adjustment of the mA and/or kV according to patient size and/or use of iterative reconstruction technique. COMPARISON:  None Available. FINDINGS: Lower chest: Bibasilar consolidations and ground-glass densities, most prominent in the lower lobes. Hepatobiliary: No focal liver abnormality is seen. Status post cholecystectomy. No biliary dilatation. Pancreas: Unremarkable. No pancreatic ductal dilatation or surrounding inflammatory changes. Spleen: Normal in size without focal abnormality. Adrenals/Urinary Tract: The adrenal glands are unremarkable. Small bilateral renal calculi. No hydronephrosis. Multiple small layering calculi within the bladder, which is mostly decompressed by Foley catheter. Stomach/Bowel: Gastrostomy tube within the  stomach, which is otherwise within normal limits. No bowel wall thickening, distention, or surrounding inflammatory changes. Colonic diverticulosis. Normal appendix. Vascular/Lymphatic: Aortic atherosclerosis. No enlarged abdominal or pelvic lymph nodes. Reproductive: Prostate is unremarkable. Other: No free fluid or pneumoperitoneum. Musculoskeletal: No acute or significant osseous findings. IMPRESSION: 1. Multifocal bibasilar pneumonia. 2. No acute intra-abdominal process. 3. Bilateral nonobstructive nephrolithiasis. Multiple small layering calculi within the bladder. 4.  Aortic Atherosclerosis (ICD10-I70.0). Electronically Signed   By: Obie Dredge M.D.   On: 09/17/2022 15:51   EEG adult  Result Date: 09/16/2022 Charlsie Quest, MD     09/16/2022  5:29 PM Patient Name: JEBIDIAH BAGGERLY MRN: [id number] Epilepsy Attending: Charlsie Quest Referring Physician/Provider: Starleen Arms, MD Date: 09/16/2022 Duration: 23.32 mins Patient history: 71yo m with ams. EEG to evaluate for seizure Level of alertness:  lethargic AEDs during EEG study: LEV Technical aspects: This EEG study was done with scalp electrodes positioned according to the 10-20 International system of electrode placement. Electrical activity was reviewed with band pass filter of 1-70Hz , sensitivity of 7 uV/mm, display speed of 21mm/sec with a  notched filter applied as appropriate. EEG data were recorded continuously and digitally stored.  Video monitoring was available and reviewed as appropriate. Description: No posterior dominant rhythm was seen. EEG showed continuous generalized and maximal right temporal region 3 to 6 Hz theta-delta slowing. Sharp transient were noted in right temporal region.  Hyperventilation and photic stimulation were not performed.    ABNORMALITY - Continuous slow, generalized and maximal right temporal region - Breach artifact, right temporal region  IMPRESSION: This study is suggestive of cortical dysfunction  arising from right temporal region likely secondary to underlying craniotomy. Additionally there is moderate to severe diffuse encephalopathy, nonspecific etiology. No seizures or definite epileptiform discharges were seen throughout the recording. Charlsie Quest   DG Chest Port 1 View  Result Date: 09/12/2022 CLINICAL DATA:  71 year old male with sepsis. EXAM: PORTABLE CHEST 1 VIEW COMPARISON:  Portable chest 09/11/2022 and earlier. FINDINGS: Portable AP upright view at 0546 hours. Stable tracheostomy. Patient now rotated to the right. Lung volumes and mediastinal contours are stable. Patchy and confluent medial lung base opacity is greater on the left and not significantly changed. No pneumothorax, pulmonary edema or pleural effusion. Prior sternotomy. Negative visible bowel gas. Cholecystectomy clips. No acute osseous abnormality identified. IMPRESSION: 1. Unchanged medial lung base opacity, greater on the left. Stable tracheostomy. 2. No new cardiopulmonary abnormality. Electronically Signed   By: Odessa Fleming M.D.   On: 09/12/2022 06:27   CT HEAD WO CONTRAST ( )  Result Date: 09/11/2022 CLINICAL DATA:  71 year old male with delirium. EXAM: CT HEAD WITHOUT CONTRAST TECHNIQUE: Contiguous axial images were obtained from the base of the skull through the vertex without intravenous contrast. RADIATION DOSE REDUCTION: This exam was performed according to the departmental dose-optimization program which includes automated exposure control, adjustment of the mA and/or kV according to patient size and/or use of iterative reconstruction technique. COMPARISON:  None Available. FINDINGS: Brain: Moderate to severe ventriculomegaly, but appears likely ex vacuo related with underlying anterior bifrontal, bitemporal, and also right parietal encephalomalacia. Mid brain atrophy.  Normal basilar cistern patency. No acute intracranial hemorrhage identified. No midline shift or evidence of acute intracranial mass effect. No  cortically based acute infarct identified. Vascular: Calcified atherosclerosis at the skull base. Skull: Previous broad-based right frontotemporal craniotomy. No acute osseous abnormality identified. Partially visible bulky dural or ligamentous thickening and calcification posterior to the odontoid and C2 (series 4, image 1). Suspect significant upper cervical spinal stenosis as result. Sinuses/Orbits: Scattered mild paranasal sinus mucosal thickening and/or small mucous retention cysts. Mild inferior mastoid opacification. Tympanic cavities and majority of the bilateral mastoids are clear. Other: No acute orbit or scalp soft tissue finding. IMPRESSION: 1. No acute intracranial abnormality identified. 2. Previous right frontotemporal craniotomy. Pronounced ventriculomegaly, but likely ex-vacuo related with underlying anterior bifrontal, bitemporal,  and right parietal encephalomalacia. 3. Partially visible bulky dural or ligamentous thickening and calcification posterior to the odontoid. Subsequent upper cervical spinal stenosis. Electronically Signed   By: Odessa Fleming M.D.   On: 09/11/2022 05:45   DG CHEST PORT 1 VIEW  Result Date: 09/11/2022 CLINICAL DATA:  Urethral bleeding after pulling Foley catheter out. Fever. Hypotension. EXAM: PORTABLE CHEST 1 VIEW COMPARISON:  09/10/2022 FINDINGS: Tracheostomy is unchanged. Prior CABG. Heart is borderline in size. Vascular congestion. Bibasilar opacities, favor atelectasis although infiltrate/pneumonia cannot be excluded. No effusions. IMPRESSION: Mild vascular congestion. Bibasilar atelectasis or infiltrates. Electronically Signed   By: Charlett Nose M.D.   On: 09/11/2022 00:37   DG Abd 1 View  Result Date: 09/10/2022 CLINICAL DATA:  Nausea and vomiting. EXAM: ABDOMEN - 1 VIEW COMPARISON:  June 19, 2022 FINDINGS: A percutaneous gastrostomy tube is seen with its distal tip and insufflator bulb overlying the medial aspect of the left upper quadrant. A mildly dilated small  bowel loop is seen overlying the mid to lower right abdomen. An additional air-filled loop of small bowel within the mid to upper left abdomen appears to approach the limit of normal caliber. A radiopaque surgical clip is seen overlying the right upper quadrant. No radio-opaque calculi or other significant radiographic abnormality are seen. IMPRESSION: 1. Percutaneous gastrostomy tube positioning, as described above. 2. Mildly dilated small bowel loop overlying the mid to lower right abdomen. A small bowel obstruction cannot be excluded. Further evaluation with abdomen and pelvis CT is recommended. Electronically Signed   By: Aram Candela M.D.   On: 09/10/2022 21:54   DG Chest Port 1 View  Result Date: 09/10/2022 CLINICAL DATA:  Blood coming from the patient's Foley catheter. EXAM: PORTABLE CHEST 1 VIEW COMPARISON:  May 14, 2021 FINDINGS: A tracheostomy tube is in place with its distal tip at the level of the clavicles. Multiple sternal wires and vascular clips are seen. The heart size and mediastinal contours are within normal limits. Both lungs are clear. Degenerative changes are seen throughout the thoracic spine. IMPRESSION: 1. Evidence of prior median sternotomy/CABG. 2. No active cardiopulmonary disease. Electronically Signed   By: Aram Candela M.D.   On: 09/10/2022 19:38     TODAY-DAY OF DISCHARGE:  Subjective:   Jeannetta Ellis today appears unchanged.  Stable on 25 L FiO2.  Awake but nonverbal  Objective:   Blood pressure 103/60, pulse 87, temperature 98.2 F (36.8 C), temperature source Oral, resp. rate 20, height 5' 11.75" (1.822 m), weight 81.6 kg, SpO2 99 %.  Intake/Output Summary (Last 24 hours) at 09/25/2022 0935 Last data filed at 09/25/2022 0600 Gross per 24 hour  Intake 4112.84 ml  Output 1475 ml  Net 2637.84 ml   Filed Weights   09/23/22 0411 09/24/22 0500 09/25/22 0500  Weight: 82.1 kg 82.1 kg 81.6 kg    Exam: Awake Alert, No new F.N deficits, Normal  affect [place].AT,PERRAL Supple Neck,No JVD, No cervical lymphadenopathy appriciated.  Symmetrical Chest wall movement, Good air movement bilaterally, CTAB RRR,No Gallops,Rubs or new Murmurs, No Parasternal Heave +ve B.Sounds, Abd Soft, Non tender, No organomegaly appriciated, No rebound -guarding or rigidity. No Cyanosis, Clubbing or edema, No new Rash or bruise   PERTINENT RADIOLOGIC STUDIES: No results found.   PERTINENT LAB RESULTS: CBC: No results for input(s): "WBC", "HGB", "HCT", "PLT" in the last 72 hours. CMET CMP     Component Value Date/Time   NA 146 (H) 09/23/2022 0402   K 3.9 09/23/2022 0402  CL 109 09/23/2022 0402   CO2 28 09/23/2022 0402   GLUCOSE 124 (H) 09/23/2022 0402   BUN 29 (H) 09/23/2022 0402   CREATININE 0.40 (L) 09/23/2022 0402   CALCIUM 9.5 09/23/2022 0402   PROT 5.8 (L) 09/17/2022 0310   ALBUMIN 1.9 (L) 09/17/2022 0310   AST 22 09/17/2022 0310   ALT 27 09/17/2022 0310   ALKPHOS 94 09/17/2022 0310   BILITOT 0.5 09/17/2022 0310   GFRNONAA >60 09/23/2022 0402   GFRAA >60 05/15/2020 1921    GFR Estimated Creatinine Clearance: 93.6 mL/min (A) (by C-G formula based on SCr of 0.4 mg/dL (L)). No results for input(s): "LIPASE", "AMYLASE" in the last 72 hours. No results for input(s): "CKTOTAL", "CKMB", "CKMBINDEX", "TROPONINI" in the last 72 hours. Invalid input(s): "POCBNP" No results for input(s): "DDIMER" in the last 72 hours. No results for input(s): "HGBA1C" in the last 72 hours. No results for input(s): "CHOL", "HDL", "LDLCALC", "TRIG", "CHOLHDL", "LDLDIRECT" in the last 72 hours. No results for input(s): "TSH", "T4TOTAL", "T3FREE", "THYROIDAB" in the last 72 hours.  Invalid input(s): "FREET3" No results for input(s): "VITAMINB12", "FOLATE", "FERRITIN", "TIBC", "IRON", "RETICCTPCT" in the last 72 hours. Coags: No results for input(s): "INR" in the last 72 hours.  Invalid input(s): "PT" Microbiology: Recent Results (from the past 240 hour(s))   Culture, Respiratory w Gram Stain     Status: None   Collection Time: 09/18/22  7:30 AM   Specimen: Tracheal Aspirate  Result Value Ref Range Status   Specimen Description TRACHEAL ASPIRATE  Final   Special Requests NONE  Final   Gram Stain   Final    RARE WBC PRESENT, PREDOMINANTLY PMN RARE GRAM POSITIVE RODS Performed at [facility], [address] [place], [place] [address]    Culture   Final    RARE PSEUDOMONAS AERUGINOSA FEW KLEBSIELLA PNEUMONIAE    Report Status 09/21/2022 FINAL  Final   Organism ID, Bacteria PSEUDOMONAS AERUGINOSA  Final   Organism ID, Bacteria KLEBSIELLA PNEUMONIAE  Final      Susceptibility   Klebsiella pneumoniae - MIC*    AMPICILLIN >=32 RESISTANT Resistant     CEFAZOLIN <=4 SENSITIVE Sensitive     CEFEPIME <=0.12 SENSITIVE Sensitive     CEFTAZIDIME <=1 SENSITIVE Sensitive     CEFTRIAXONE <=0.25 SENSITIVE Sensitive     CIPROFLOXACIN <=0.25 SENSITIVE Sensitive     GENTAMICIN <=1 SENSITIVE Sensitive     IMIPENEM <=0.25 SENSITIVE Sensitive     TRIMETH/SULFA <=20 SENSITIVE Sensitive     AMPICILLIN/SULBACTAM 16 INTERMEDIATE Intermediate     PIP/TAZO 16 SENSITIVE Sensitive     * FEW KLEBSIELLA PNEUMONIAE   Pseudomonas aeruginosa - MIC*    CEFTAZIDIME <=1 SENSITIVE Sensitive     CIPROFLOXACIN <=0.25 SENSITIVE Sensitive     GENTAMICIN <=1 SENSITIVE Sensitive     IMIPENEM 2 SENSITIVE Sensitive     PIP/TAZO <=4 SENSITIVE Sensitive     CEFEPIME 2 SENSITIVE Sensitive     * RARE PSEUDOMONAS AERUGINOSA    FURTHER DISCHARGE INSTRUCTIONS:  Get Medicines reviewed and adjusted: Please take all your medications with you for your next visit with your Primary MD  Laboratory/radiological data: Please request your Primary MD to go over all hospital tests and procedure/radiological results at the follow up, please ask your Primary MD to get all Hospital records sent to his/her office.  In some cases, they will be blood work, cultures and biopsy results  pending at the time of your discharge. Please request that  your primary care M.D. goes through all the records of your hospital data and follows up on these results.  Also Note the following: If you experience worsening of your admission symptoms, develop shortness of breath, life threatening emergency, suicidal or homicidal thoughts you must seek medical attention immediately by calling 911 or calling your MD immediately  if symptoms less severe.  You must read complete instructions/literature along with all the possible adverse reactions/side effects for all the Medicines you take and that have been prescribed to you. Take any new Medicines after you have completely understood and accpet all the possible adverse reactions/side effects.   Do not drive when taking Pain medications or sleeping medications (Benzodaizepines)  Do not take more than prescribed Pain, Sleep and Anxiety Medications. It is not advisable to combine anxiety,sleep and pain medications without talking with your primary care practitioner  Special Instructions: If you have smoked or chewed Tobacco  in the last 2 yrs please stop smoking, stop any regular Alcohol  and or any Recreational drug use.  Wear Seat belts while driving.  Please note: You were cared for by a hospitalist during your hospital stay. Once you are discharged, your primary care physician will handle any further medical issues. Please note that NO REFILLS for any discharge medications will be authorized once you are discharged, as it is imperative that you return to your primary care physician (or establish a relationship with a primary care physician if you do not have one) for your post hospital discharge needs so that they can reassess your need for medications and monitor your lab values.  Total Time spent coordinating discharge including counseling, education and face to face time equals greater than 30 minutes.  SignedJeoffrey Massed 09/25/2022 9:35  AM

## 2022-09-25 NOTE — Progress Notes (Signed)
Patient seen today by trach team for consult.  No education is needed at this time.  All necessary equipment is at beside.   Will continue to follow for progression.  

## 2022-09-25 NOTE — Progress Notes (Signed)
Patient currently on 8L 35% ATC. Patient tolerating well at this time sating 96%. RT will continue to monitor.

## 2022-12-12 ENCOUNTER — Emergency Department (HOSPITAL_COMMUNITY)
Admission: EM | Admit: 2022-12-12 | Discharge: 2022-12-13 | Disposition: A | Discharge status: Home / Self Care | Payer: No Typology Code available for payment source | Attending: Emergency Medicine | Admitting: Emergency Medicine

## 2022-12-12 ENCOUNTER — Emergency Department (HOSPITAL_COMMUNITY): Payer: No Typology Code available for payment source

## 2022-12-12 ENCOUNTER — Other Ambulatory Visit: Payer: Self-pay

## 2022-12-12 ENCOUNTER — Encounter (HOSPITAL_COMMUNITY): Payer: Self-pay

## 2022-12-12 DIAGNOSIS — N3001 Acute cystitis with hematuria: Secondary | ICD-10-CM | POA: Diagnosis not present

## 2022-12-12 DIAGNOSIS — Z794 Long term (current) use of insulin: Secondary | ICD-10-CM | POA: Diagnosis not present

## 2022-12-12 DIAGNOSIS — R319 Hematuria, unspecified: Secondary | ICD-10-CM | POA: Diagnosis present

## 2022-12-12 DIAGNOSIS — Z7901 Long term (current) use of anticoagulants: Secondary | ICD-10-CM | POA: Insufficient documentation

## 2022-12-12 HISTORY — DX: Unspecified intracranial injury with loss of consciousness status unknown, initial encounter: S06.9XAA

## 2022-12-12 HISTORY — DX: Essential (primary) hypertension: I10

## 2022-12-12 HISTORY — DX: Atherosclerotic heart disease of native coronary artery without angina pectoris: I25.10

## 2022-12-12 HISTORY — DX: Retention of urine, unspecified: R33.9

## 2022-12-12 HISTORY — DX: Hypothyroidism, unspecified: E03.9

## 2022-12-12 HISTORY — DX: Tracheostomy status: Z93.0

## 2022-12-12 LAB — URINALYSIS, ROUTINE W REFLEX MICROSCOPIC
Bilirubin Urine: NEGATIVE
Glucose, UA: 50 mg/dL — AB
Ketones, ur: NEGATIVE mg/dL
Nitrite: POSITIVE — AB
Protein, ur: 100 mg/dL — AB
RBC / HPF: >50 RBC/hpf — ABNORMAL HIGH (ref 0–5)
Specific Gravity, Urine: 1.02 (ref 1.005–1.030)
WBC, UA: >50 WBC/hpf — ABNORMAL HIGH (ref 0–5)
pH: 6 (ref 5.0–8.0)

## 2022-12-12 MED ORDER — CEFTRIAXONE SODIUM 1 G IJ SOLR
1.0000 g | Freq: Once | INTRAMUSCULAR | Status: AC
  Administered 2022-12-12: 1 g via INTRAMUSCULAR
  Filled 2022-12-12: qty 10

## 2022-12-12 MED ORDER — CEPHALEXIN 500 MG PO CAPS: 500.0000 mg | ORAL_CAPSULE | Freq: Four times a day (QID) | ORAL | 0 refills | Status: DC

## 2022-12-12 MED ORDER — STERILE WATER FOR INJECTION IJ SOLN
INTRAMUSCULAR | Status: AC
  Filled 2022-12-12: qty 10

## 2022-12-12 NOTE — ED Notes (Addendum)
[facility] called for transport back to [facility]

## 2022-12-12 NOTE — ED Notes (Signed)
Patient transported to CT 

## 2022-12-12 NOTE — ED Triage Notes (Signed)
Pt BIB via [facility] from [facility] for hematuria noticed today. Pt has hx of trach, foley, PEG tube, TBI

## 2022-12-12 NOTE — ED Provider Notes (Signed)
[facility]-EMERGENCY DEPT Provider Note   CSN: [id number] Arrival date & time: 12/12/22  2005     History  Chief Complaint  Patient presents with   Hematuria    Joshua Haley is a 71 y.o. male.  Patient brought in from [facility] by [facility].  They noticed hematuria today and noticed that there was cloudy urine yesterday patient has a longstanding indwelling Foley catheter that was apparently recently replaced.  He has a trach he has PEG tube history of traumatic brain injury.  He has a noted date of service on December 03, 2022 from [facility].  Appears the admission to them was August 2023.  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  Supposedly Foley catheter recently changed.  Patient's vital signs on arrival here Temp 98.3 pulse 103 respirations 18 blood pressure 127/80 oxygen saturations 98%.  Patient's Foley catheter in place is very cloudy does have some blood clots.  But not pure gross hematuria.  Chart review shows that patient was treated for urinary tract infection back in September.  Patient currently not on any antibiotics.  Also there is a history of diabetes without complication history of kidney stone.  Patient had his gallbladder removed.       Home Medications Prior to Admission medications   Medication Sig Start Date End Date Taking? Authorizing Provider  cephALEXin (KEFLEX) 500 MG capsule Take 1 capsule (500 mg total) by mouth 4 (four) times daily. 12/12/22  Yes Vanetta Mulders, MD  acetaminophen (TYLENOL) 325 MG tablet Give 650mg  per tube every 8 hours as needed for pain    [provider]  amantadine (SYMMETREL) 50 MG/5ML solution Place 100 mg into feeding tube daily.    [provider]  Amino Acids-Protein Hydrolys (PRO-STAT AWC) LIQD  Place 30 mLs into feeding tube daily.    [provider]  ascorbic acid (VITAMIN C) 500 MG tablet Give 500mg  per tube daily    [provider]  enoxaparin (LOVENOX) 40 MG/0.4ML injection Inject 40 mg into the skin daily.    [provider]  ferrous sulfate 300 (60 Fe) MG/5ML syrup Place 300 mg into feeding tube 2 (two) times daily with a meal.    [provider]  insulin aspart (NOVOLOG) 100 UNIT/ML injection insulin aspart (novoLOG) injection 0-15 Units 0-15 Units, Subcutaneous, Every 4 hours,  CBG < 70: Implement Hypoglycemia Standing Orders and refer to Hypoglycemia Standing Orders sidebar report CBG 70 - 120: 0 units CBG 121 - 150: 2 units CBG 151 - 200: 3 units CBG 201 - 250: 5 units CBG 251 - 300: 8 units CBG 301 - 350: 11 units CBG 351 - 400: 15 units CBG > 400: call MD 09/25/22   02-26-1984, MD  insulin glargine (LANTUS) 100 UNIT/ML injection Inject 0.25 mLs (25 Units total) into the skin daily. 09/25/22   Ghimire, Maretta Bees, MD  lansoprazole (PREVACID SOLUTAB) 30 MG disintegrating tablet Give 30mg  per tube daily    [provider]  levETIRAcetam (KEPPRA) 100 MG/ML solution Place 250 mg into feeding tube every 12 (twelve) hours.    [provider]  levothyroxine (SYNTHROID) 100 MCG tablet Give 09/27/22 per tube daily    [provider]  liothyronine (CYTOMEL) 5 MCG tablet Give one and one half tablet (7.5mg ) per tube every 12 hours  [provider]  Methylcellulose, Laxative, (CITRUCEL) 500 MG TABS Place 500 mg into feeding tube 3 (three) times daily.    [provider]  modafinil (PROVIGIL) 100 MG tablet Give 50mg  (one half tablet) per tube daily    [provider]  multivitamin (ONE-A-DAY MEN'S) TABS tablet Give 1 tablet per tube daily    [provider]  nutrition supplement, JUVEN, (JUVEN) PACK Place 1 packet into feeding tube 2 (two) times daily between meals. 09/25/22   Ghimire, 09/27/22, MD  Nutritional Supplements (FEEDING SUPPLEMENT, GLUCERNA 1.5 CAL,) LIQD Give 55mL/hr per tube by shift    [provider]  omega-3 acid ethyl esters (LOVAZA) 1 g capsule Give 1 g per tube daily    [provider]  polyethylene glycol (MIRALAX / GLYCOLAX) 17 g packet Give 17g per tube daily as needed for constipation    [provider]  sertraline (ZOLOFT) 25 MG tablet Give 25mg  per tube daily    [provider]  Water For Irrigation, Sterile (FREE WATER) SOLN Place 200 mLs into feeding tube every 8 (eight) hours. 09/25/22   Ghimire, , MD      Allergies    Morphine and related, Penicillins, and Statins    Review of Systems   Review of Systems  Unable to perform ROS: Patient nonverbal    Physical Exam Updated Vital Signs BP 123/81   Pulse (!) 107   Temp 98.3 F (36.8 C) (Axillary)   Resp 18   SpO2 97%  Physical Exam Vitals and nursing note reviewed.  Constitutional:      General: He is not in acute distress.    Appearance: He is well-developed.  HENT:     Head: Normocephalic and atraumatic.  Eyes:     Conjunctiva/sclera: Conjunctivae normal.  Neck:     Comments: Trach tube in place Cardiovascular:     Rate and Rhythm: Normal rate and regular rhythm.     Heart sounds: No murmur heard. Pulmonary:     Effort: Pulmonary effort is normal. No respiratory distress.     Breath sounds: Normal breath sounds.  Abdominal:     Palpations: Abdomen is soft.     Tenderness: There is no abdominal tenderness.     Comments: Feeding tube in place Foley catheter in place  Genitourinary:    Comments: Foley catheter in place. Musculoskeletal:        General: No swelling.     Cervical back: Neck supple.  Skin:    General: Skin is warm and dry.     Capillary Refill: Capillary refill takes less than 2 seconds.  Neurological:     Mental Status: Mental status is at baseline.  Psychiatric:        Mood and Affect: Mood normal.     ED Results  / Procedures / Treatments   Labs (all labs ordered are listed, but only abnormal results are displayed) Labs Reviewed  URINALYSIS, ROUTINE W REFLEX MICROSCOPIC - Abnormal; Notable for the following components:      Result Value   Color, Urine AMBER (*)    APPearance CLOUDY (*)    Glucose, UA 50 (*)    Hgb urine dipstick LARGE (*)    Protein, ur 100 (*)    Nitrite POSITIVE (*)    Leukocytes,Ua SMALL (*)    RBC / HPF >50 (*)    WBC, UA >50 (*)    Bacteria, UA FEW (*)    All other components within  normal limits  URINE CULTURE    EKG None  Radiology CT Renal Stone Study  Result Date: 12/12/2022 CLINICAL DATA:  Traumatic brain injury, hematuria, indwelling Foley catheter with neurogenic bladder. EXAM: CT ABDOMEN AND PELVIS WITHOUT CONTRAST TECHNIQUE: Multidetector CT imaging of the abdomen and pelvis was performed following the standard protocol without IV contrast. RADIATION DOSE REDUCTION: This exam was performed according to the departmental dose-optimization program which includes automated exposure control, adjustment of the mA and/or kV according to patient size and/or use of iterative reconstruction technique. COMPARISON:  09/17/2022 FINDINGS: Lower chest: Patchy right lower lobe opacity, improved. Mild ground-glass opacity in the left lower lobe. Overall, when correlating with the prior, the residual appearance favors post infectious/inflammatory scarring rather than pneumonia. Hepatobiliary: Unenhanced liver is unremarkable. Status post cholecystectomy. No intrahepatic or extrahepatic ductal dilatation. Pancreas: Within normal limits. Spleen: Within normal limits. Adrenals/Urinary Tract: Adrenal glands are within normal limits. Multiple bilateral nonobstructing renal calculi, approximately 8 on the right and 6 on the left, measuring up to 5 mm in the left upper kidney. No hydronephrosis. Thick-walled bladder with indwelling Foley catheter and numerous layering bladder calculi.  Overall, this appearance is grossly unchanged from the prior. Stomach/Bowel: Stomach is notable for a percutaneous gastrostomy in satisfactory position. No evidence of bowel obstruction. Normal appendix (series 2/image 59). Scattered colonic diverticulosis, without evidence of diverticulitis. Vascular/Lymphatic: No evidence of abdominal aortic aneurysm. Atherosclerotic calcifications of the abdominal aorta and branch vessels. No suspicious abdominopelvic lymphadenopathy. Reproductive: Prostate is grossly unremarkable. Other: No abdominopelvic ascites. Musculoskeletal: Degenerative changes of the visualized thoracolumbar spine. Median sternotomy. IMPRESSION: Multiple bilateral nonobstructing renal calculi and numerous layering bladder calculi, grossly unchanged from prior. Thick-walled bladder, likely related to the patient's known neurogenic bladder. Indwelling Foley catheter. Residual lower lobe pulmonary opacities, improved, favoring post infectious/inflammatory scarring. Additional ancillary findings as above. Electronically Signed   By: Charline Bills M.D.   On: 12/12/2022 22:51    Procedures Procedures    Medications Ordered in ED Medications  cefTRIAXone (ROCEPHIN) injection 1 g (1 g Intramuscular Given 12/12/22 2206)  sterile water (preservative free) injection (  Given 12/12/22 2206)    ED Course/ Medical Decision Making/ A&P                           Medical Decision Making Amount and/or Complexity of Data Reviewed Labs: ordered. Radiology: ordered.  Risk Prescription drug management.   Patient nonverbal has traumatic brain injury from [facility].  Urinalysis consistent with UTI.  Patient has a history of kidney stones so went and had did CT renal just to make sure no obstructing stone.  No evidence of obstructing stone.  But evidence of multiple stones up in the kidneys.  But the ureters were clear and not obstructed.  Treated here with IM Rocephin urine sent for culture  patient nontoxic no acute distress.  Not showing any signs or concerns for sepsis at this time.  Would recommend either continuing IM Rocephin or they can go with Keflex in the feeding tube.  And then adjust based on urine culture.  Patient stable for discharge back to [facility]. Final Clinical Impression(s) / ED Diagnoses Final diagnoses:  Acute cystitis with hematuria    Rx / DC Orders ED Discharge Orders          Ordered    cephALEXin (KEFLEX) 500 MG capsule  4 times daily        12/12/22 2309  Vanetta MuldersZackowski, Tatia Petrucci, MD 12/12/22 [phone]

## 2022-12-12 NOTE — Discharge Instructions (Addendum)
Urinalysis consistent with urinary tract infection.  Positive nitrite greater than 50 red blood cells and greater 50 white blood cells.  Urine culture sent.  CT renal done without any evidence of ureteral stones.  There are multiple stones up in the kidney.  No signs of any ureteral obstruction.  Patient received Rocephin 1 g IM.  Would commend continuing with Keflex 500 mg every 6 through his feeding tube.  And following up with the urine culture.

## 2022-12-15 LAB — URINE CULTURE: Culture: 60000 — AB

## 2022-12-16 ENCOUNTER — Telehealth (HOSPITAL_BASED_OUTPATIENT_CLINIC_OR_DEPARTMENT_OTHER): Payer: Self-pay | Admitting: Emergency Medicine

## 2022-12-16 NOTE — Progress Notes (Signed)
ED Antimicrobial Stewardship Positive Culture Follow Up   Joshua Haley is an 71 y.o. male who presented to [facility] on 12/12/2022 with a chief complaint of  Chief Complaint  Patient presents with   Hematuria    Recent Results (from the past 720 hour(s))  Urine Culture     Status: Abnormal   Collection Time: 12/12/22  8:56 PM   Specimen: In/Out Cath Urine  Result Value Ref Range Status   Specimen Description   Final    IN/OUT CATH URINE Performed at [facility], [address], [place], [place] [address]    Special Requests   Final    NONE Performed at [facility], [address] [place], [place] [address]    Culture (A)  Final    60,000 COLONIES/mL ENTEROCOCCUS FAECALIS 60,000 COLONIES/mL ESCHERICHIA COLI Confirmed Extended Spectrum Beta-Lactamase Producer (ESBL).  In bloodstream infections from ESBL organisms, carbapenems are preferred over piperacillin/tazobactam. They are shown to have a lower risk of mortality.    Report Status 12/15/2022 FINAL  Final   Organism ID, Bacteria ENTEROCOCCUS FAECALIS (A)  Final   Organism ID, Bacteria ESCHERICHIA COLI (A)  Final      Susceptibility   Escherichia coli - MIC*    AMPICILLIN >=32 RESISTANT Resistant     CEFAZOLIN >=64 RESISTANT Resistant     CEFEPIME 16 RESISTANT Resistant     CEFTRIAXONE >=64 RESISTANT Resistant     CIPROFLOXACIN >=4 RESISTANT Resistant     GENTAMICIN <=1 SENSITIVE Sensitive     IMIPENEM <=0.25 SENSITIVE Sensitive     NITROFURANTOIN <=16 SENSITIVE Sensitive     TRIMETH/SULFA <=20 SENSITIVE Sensitive     AMPICILLIN/SULBACTAM >=32 RESISTANT Resistant     PIP/TAZO 16 SENSITIVE Sensitive     * 60,000 COLONIES/mL ESCHERICHIA COLI   Enterococcus faecalis - MIC*    AMPICILLIN <=2 SENSITIVE Sensitive     NITROFURANTOIN <=16 SENSITIVE Sensitive     VANCOMYCIN 1 SENSITIVE Sensitive     * 60,000 COLONIES/mL ENTEROCOCCUS FAECALIS    [x]  Treated with ceftriaxone &  cephalexin, organism resistant to prescribed antimicrobial  Plan: no treatment needed.   ED Provider: , MD  Pricilla Loveless, Pharm.D Use secure chat for questions 12/16/2022 10:34 AM Clinical Pharmacist [phone]

## 2022-12-16 NOTE — Telephone Encounter (Signed)
Post ED Visit - Positive Culture Follow-up  Culture report reviewed by antimicrobial stewardship pharmacist: [facility] []  , Pharm.D. []  Enzo Bi, Pharm.D., BCPS AQ-ID []  , Pharm.D., BCPS []  Celedonio Miyamoto, Pharm.D., BCPS []  [place], Garvin Fila.D., BCPS, AAHIVP []  , Pharm.D., BCPS, AAHIVP []  Georgina Pillion, PharmD, BCPS []  , PharmD, BCPS []  [place], PharmD, BCPS []  [address], PharmD []  , PharmD, BCPS []  Estella Husk, PharmD  Pharmacy Team []  Lysle Pearl, PharmD []  , PharmD []  Phillips Climes, PharmD []  , Rph []  Agapito Games) , PharmD []  Verlan Friends, PharmD []  , PharmD []  Mervyn Gay, PharmD []  , PharmD []  Vinnie Level, PharmD []  [facility], PharmD []  , PharmD []  Len Childs, PharmD   Positive urine culture Treated with cephalexin, rocephin, organism sensitive to the same and no further patient follow-up is required at this time.  12/16/2022, 12:28 PM

## 2022-12-28 ENCOUNTER — Inpatient Hospital Stay (HOSPITAL_COMMUNITY)
Admission: EM | Admit: 2022-12-28 | Discharge: 2022-12-30 | DRG: 871 | Disposition: A | Discharge status: Skilled Nursing Facility | Payer: No Typology Code available for payment source | Source: Skilled Nursing Facility | Attending: Internal Medicine | Admitting: Internal Medicine

## 2022-12-28 ENCOUNTER — Emergency Department (HOSPITAL_COMMUNITY): Payer: No Typology Code available for payment source

## 2022-12-28 DIAGNOSIS — J189 Pneumonia, unspecified organism: Secondary | ICD-10-CM | POA: Diagnosis present

## 2022-12-28 DIAGNOSIS — J9621 Acute and chronic respiratory failure with hypoxia: Secondary | ICD-10-CM | POA: Diagnosis present

## 2022-12-28 DIAGNOSIS — R4701 Aphasia: Secondary | ICD-10-CM | POA: Diagnosis present

## 2022-12-28 DIAGNOSIS — Z7901 Long term (current) use of anticoagulants: Secondary | ICD-10-CM

## 2022-12-28 DIAGNOSIS — Z87891 Personal history of nicotine dependence: Secondary | ICD-10-CM

## 2022-12-28 DIAGNOSIS — R197 Diarrhea, unspecified: Secondary | ICD-10-CM | POA: Diagnosis present

## 2022-12-28 DIAGNOSIS — Z66 Do not resuscitate: Secondary | ICD-10-CM | POA: Diagnosis present

## 2022-12-28 DIAGNOSIS — G9341 Metabolic encephalopathy: Secondary | ICD-10-CM | POA: Diagnosis present

## 2022-12-28 DIAGNOSIS — E039 Hypothyroidism, unspecified: Secondary | ICD-10-CM | POA: Diagnosis present

## 2022-12-28 DIAGNOSIS — I10 Essential (primary) hypertension: Secondary | ICD-10-CM | POA: Diagnosis present

## 2022-12-28 DIAGNOSIS — A419 Sepsis, unspecified organism: Secondary | ICD-10-CM | POA: Diagnosis not present

## 2022-12-28 DIAGNOSIS — R6521 Severe sepsis with septic shock: Secondary | ICD-10-CM

## 2022-12-28 DIAGNOSIS — Z79890 Hormone replacement therapy (postmenopausal): Secondary | ICD-10-CM

## 2022-12-28 DIAGNOSIS — Z888 Allergy status to other drugs, medicaments and biological substances status: Secondary | ICD-10-CM

## 2022-12-28 DIAGNOSIS — Z87440 Personal history of urinary (tract) infections: Secondary | ICD-10-CM

## 2022-12-28 DIAGNOSIS — E1165 Type 2 diabetes mellitus with hyperglycemia: Secondary | ICD-10-CM | POA: Diagnosis present

## 2022-12-28 DIAGNOSIS — A4151 Sepsis due to Escherichia coli [E. coli]: Secondary | ICD-10-CM | POA: Diagnosis present

## 2022-12-28 DIAGNOSIS — L89210 Pressure ulcer of right hip, unstageable: Secondary | ICD-10-CM | POA: Diagnosis present

## 2022-12-28 DIAGNOSIS — E870 Hyperosmolality and hypernatremia: Secondary | ICD-10-CM | POA: Diagnosis present

## 2022-12-28 DIAGNOSIS — Z951 Presence of aortocoronary bypass graft: Secondary | ICD-10-CM

## 2022-12-28 DIAGNOSIS — Z87442 Personal history of urinary calculi: Secondary | ICD-10-CM

## 2022-12-28 DIAGNOSIS — Z1152 Encounter for screening for COVID-19: Secondary | ICD-10-CM

## 2022-12-28 DIAGNOSIS — Z880 Allergy status to penicillin: Secondary | ICD-10-CM

## 2022-12-28 DIAGNOSIS — Y95 Nosocomial condition: Secondary | ICD-10-CM

## 2022-12-28 DIAGNOSIS — Z931 Gastrostomy status: Secondary | ICD-10-CM

## 2022-12-28 DIAGNOSIS — I2510 Atherosclerotic heart disease of native coronary artery without angina pectoris: Secondary | ICD-10-CM | POA: Diagnosis present

## 2022-12-28 DIAGNOSIS — B9620 Unspecified Escherichia coli [E. coli] as the cause of diseases classified elsewhere: Secondary | ICD-10-CM | POA: Diagnosis present

## 2022-12-28 DIAGNOSIS — Z79899 Other long term (current) drug therapy: Secondary | ICD-10-CM

## 2022-12-28 DIAGNOSIS — Z885 Allergy status to narcotic agent status: Secondary | ICD-10-CM

## 2022-12-28 DIAGNOSIS — N390 Urinary tract infection, site not specified: Secondary | ICD-10-CM | POA: Diagnosis present

## 2022-12-28 DIAGNOSIS — L89121 Pressure ulcer of left upper back, stage 1: Secondary | ICD-10-CM | POA: Diagnosis present

## 2022-12-28 DIAGNOSIS — Z9181 History of falling: Secondary | ICD-10-CM

## 2022-12-28 DIAGNOSIS — Z794 Long term (current) use of insulin: Secondary | ICD-10-CM

## 2022-12-28 DIAGNOSIS — Z6825 Body mass index (BMI) 25.0-25.9, adult: Secondary | ICD-10-CM

## 2022-12-28 DIAGNOSIS — L89153 Pressure ulcer of sacral region, stage 3: Secondary | ICD-10-CM | POA: Diagnosis present

## 2022-12-28 DIAGNOSIS — E878 Other disorders of electrolyte and fluid balance, not elsewhere classified: Secondary | ICD-10-CM | POA: Diagnosis present

## 2022-12-28 DIAGNOSIS — Z930 Tracheostomy status: Secondary | ICD-10-CM | POA: Diagnosis not present

## 2022-12-28 DIAGNOSIS — Z1612 Extended spectrum beta lactamase (ESBL) resistance: Secondary | ICD-10-CM | POA: Diagnosis present

## 2022-12-28 DIAGNOSIS — Z87820 Personal history of traumatic brain injury: Secondary | ICD-10-CM

## 2022-12-28 DIAGNOSIS — E43 Unspecified severe protein-calorie malnutrition: Secondary | ICD-10-CM | POA: Diagnosis present

## 2022-12-28 DIAGNOSIS — E876 Hypokalemia: Secondary | ICD-10-CM | POA: Diagnosis present

## 2022-12-28 DIAGNOSIS — E871 Hypo-osmolality and hyponatremia: Secondary | ICD-10-CM | POA: Diagnosis present

## 2022-12-28 DIAGNOSIS — R5381 Other malaise: Secondary | ICD-10-CM | POA: Diagnosis present

## 2022-12-28 LAB — RESPIRATORY PANEL BY PCR

## 2022-12-28 LAB — COMPREHENSIVE METABOLIC PANEL
ALT: 37 U/L (ref 0–44)
AST: 31 U/L (ref 15–41)
Albumin: 1.8 g/dL — ABNORMAL LOW (ref 3.5–5.0)
Alkaline Phosphatase: 111 U/L (ref 38–126)
Anion gap: 9 (ref 5–15)
BUN: 40 mg/dL — ABNORMAL HIGH (ref 8–23)
CO2: 29 mmol/L (ref 22–32)
Calcium: 8.7 mg/dL — ABNORMAL LOW (ref 8.9–10.3)
Chloride: 117 mmol/L — ABNORMAL HIGH (ref 98–111)
Creatinine, Ser: 0.67 mg/dL (ref 0.61–1.24)
GFR, Estimated: >60 mL/min (ref >60)
Glucose, Bld: 347 mg/dL — ABNORMAL HIGH (ref 70–99)
Potassium: 2.9 mmol/L — ABNORMAL LOW (ref 3.5–5.1)
Sodium: 155 mmol/L — ABNORMAL HIGH (ref 135–145)
Total Bilirubin: 0.7 mg/dL (ref 0.3–1.2)
Total Protein: 6.3 g/dL — ABNORMAL LOW (ref 6.5–8.1)

## 2022-12-28 LAB — CBC WITH DIFFERENTIAL/PLATELET
Abs Immature Granulocytes: 0.05 10*3/uL (ref 0.00–0.07)
Basophils Absolute: 0 10*3/uL (ref 0.0–0.1)
Basophils Relative: 0 %
Eosinophils Absolute: 0 10*3/uL (ref 0.0–0.5)
Eosinophils Relative: 0 %
HCT: 47.4 % (ref 39.0–52.0)
Hemoglobin: 13.7 g/dL (ref 13.0–17.0)
Immature Granulocytes: 0 %
Lymphocytes Relative: 12 %
Lymphs Abs: 1.4 10*3/uL (ref 0.7–4.0)
MCH: 28.6 pg (ref 26.0–34.0)
MCHC: 28.9 g/dL — ABNORMAL LOW (ref 30.0–36.0)
MCV: 99 fL (ref 80.0–100.0)
Monocytes Absolute: 0.6 10*3/uL (ref 0.1–1.0)
Monocytes Relative: 5 %
Neutro Abs: 9.7 10*3/uL — ABNORMAL HIGH (ref 1.7–7.7)
Neutrophils Relative %: 83 %
Platelets: 277 10*3/uL (ref 150–400)
RBC: 4.79 MIL/uL (ref 4.22–5.81)
RDW: 16.8 % — ABNORMAL HIGH (ref 11.5–15.5)
WBC: 11.9 10*3/uL — ABNORMAL HIGH (ref 4.0–10.5)
nRBC: 0 % (ref 0.0–0.2)

## 2022-12-28 LAB — URINALYSIS, ROUTINE W REFLEX MICROSCOPIC
Bilirubin Urine: NEGATIVE
Glucose, UA: NEGATIVE mg/dL
Hgb urine dipstick: NEGATIVE
Ketones, ur: NEGATIVE mg/dL
Nitrite: NEGATIVE
Protein, ur: 30 mg/dL — AB
Specific Gravity, Urine: 1.02 (ref 1.005–1.030)
pH: 5.5 (ref 5.0–8.0)

## 2022-12-28 LAB — GLUCOSE, CAPILLARY: Glucose-Capillary: 286 mg/dL — ABNORMAL HIGH (ref 70–99)

## 2022-12-28 LAB — PROTIME-INR
INR: 1.2 (ref 0.8–1.2)
Prothrombin Time: 15.3 s — ABNORMAL HIGH (ref 11.4–15.2)

## 2022-12-28 LAB — LACTIC ACID, PLASMA
Lactic Acid, Venous: 3.6 mmol/L (ref 0.5–1.9)
Lactic Acid, Venous: 3.1 mmol/L (ref 0.5–1.9)

## 2022-12-28 LAB — TSH: TSH: 1.778 u[IU]/mL (ref 0.350–4.500)

## 2022-12-28 LAB — STREP PNEUMONIAE URINARY ANTIGEN: Strep Pneumo Urinary Antigen: NEGATIVE

## 2022-12-28 LAB — RESP PANEL BY RT-PCR (RSV, FLU A&B, COVID)  RVPGX2
Influenza A by PCR: NEGATIVE
Influenza B by PCR: NEGATIVE
Resp Syncytial Virus by PCR: NEGATIVE
SARS Coronavirus 2 by RT PCR: NEGATIVE

## 2022-12-28 LAB — CBG MONITORING, ED: Glucose-Capillary: 332 mg/dL — ABNORMAL HIGH (ref 70–99)

## 2022-12-28 LAB — URINALYSIS, MICROSCOPIC (REFLEX)

## 2022-12-28 LAB — APTT: aPTT: 37 seconds — ABNORMAL HIGH (ref 24–36)

## 2022-12-28 MED ORDER — SODIUM CHLORIDE 0.9 % IV SOLN
1.0000 g | INTRAVENOUS | Status: AC
  Administered 2022-12-28: 1 g via INTRAVENOUS
  Filled 2022-12-28: qty 20

## 2022-12-28 MED ORDER — POLYETHYLENE GLYCOL 3350 17 G PO PACK: 17.0000 g | PACK | Freq: Every day | ORAL | Status: DC | PRN

## 2022-12-28 MED ORDER — PROSOURCE TF20 ENFIT COMPATIBL EN LIQD
60.0000 mL | Freq: Every day | ENTERAL | Status: DC
  Administered 2022-12-29: 60 mL
  Filled 2022-12-28: qty 60

## 2022-12-28 MED ORDER — LACTATED RINGERS IV BOLUS
1000.0000 mL | Freq: Once | INTRAVENOUS | Status: AC
  Administered 2022-12-28: 1000 mL via INTRAVENOUS

## 2022-12-28 MED ORDER — LACTATED RINGERS IV SOLN: INTRAVENOUS | Status: AC

## 2022-12-28 MED ORDER — INSULIN ASPART 100 UNIT/ML IJ SOLN
0.0000 [IU] | INTRAMUSCULAR | Status: DC
  Administered 2022-12-28: 8 [IU] via SUBCUTANEOUS
  Administered 2022-12-28: 11 [IU] via SUBCUTANEOUS
  Administered 2022-12-29 (×2): 5 [IU] via SUBCUTANEOUS
  Administered 2022-12-29: 8 [IU] via SUBCUTANEOUS
  Administered 2022-12-29 (×2): 5 [IU] via SUBCUTANEOUS
  Administered 2022-12-29: 8 [IU] via SUBCUTANEOUS
  Administered 2022-12-30: 11 [IU] via SUBCUTANEOUS
  Administered 2022-12-30: 5 [IU] via SUBCUTANEOUS
  Administered 2022-12-30: 8 [IU] via SUBCUTANEOUS

## 2022-12-28 MED ORDER — LACTATED RINGERS IV BOLUS (SEPSIS)
500.0000 mL | Freq: Once | INTRAVENOUS | Status: AC
  Administered 2022-12-28: 500 mL via INTRAVENOUS

## 2022-12-28 MED ORDER — CHLORHEXIDINE GLUCONATE CLOTH 2 % EX PADS
6.0000 | MEDICATED_PAD | Freq: Every day | CUTANEOUS | Status: DC
  Administered 2022-12-28 – 2022-12-30 (×3): 6 via TOPICAL

## 2022-12-28 MED ORDER — SODIUM CHLORIDE 0.9% FLUSH: 10.0000 mL | INTRAVENOUS | Status: DC | PRN

## 2022-12-28 MED ORDER — DOCUSATE SODIUM 100 MG PO CAPS: 100.0000 mg | ORAL_CAPSULE | Freq: Two times a day (BID) | ORAL | Status: DC | PRN

## 2022-12-28 MED ORDER — HEPARIN SODIUM (PORCINE) 5000 UNIT/ML IJ SOLN
5000.0000 [IU] | Freq: Three times a day (TID) | INTRAMUSCULAR | Status: DC
  Administered 2022-12-28 – 2022-12-29 (×2): 5000 [IU] via SUBCUTANEOUS
  Filled 2022-12-28 (×2): qty 1

## 2022-12-28 MED ORDER — NOREPINEPHRINE 4 MG/250ML-% IV SOLN
2.0000 ug/min | INTRAVENOUS | Status: DC
  Administered 2022-12-28 – 2022-12-30 (×2): 2 ug/min via INTRAVENOUS
  Filled 2022-12-28 (×2): qty 250

## 2022-12-28 MED ORDER — SODIUM CHLORIDE 3 % IN NEBU
4.0000 mL | INHALATION_SOLUTION | Freq: Two times a day (BID) | RESPIRATORY_TRACT | Status: DC
  Administered 2022-12-29 – 2022-12-30 (×4): 4 mL via RESPIRATORY_TRACT
  Filled 2022-12-28 (×6): qty 4

## 2022-12-28 MED ORDER — VANCOMYCIN HCL 1500 MG/300ML IV SOLN
1500.0000 mg | INTRAVENOUS | Status: AC
  Administered 2022-12-28: 1500 mg via INTRAVENOUS
  Filled 2022-12-28: qty 300

## 2022-12-28 MED ORDER — POTASSIUM CHLORIDE 10 MEQ/100ML IV SOLN
10.0000 meq | INTRAVENOUS | Status: AC
  Administered 2022-12-28 – 2022-12-29 (×5): 10 meq via INTRAVENOUS
  Filled 2022-12-28 (×5): qty 100

## 2022-12-28 MED ORDER — SODIUM CHLORIDE 0.9 % IV SOLN
1.0000 g | Freq: Three times a day (TID) | INTRAVENOUS | Status: DC
  Administered 2022-12-29 – 2022-12-30 (×6): 1 g via INTRAVENOUS
  Filled 2022-12-28 (×8): qty 20

## 2022-12-28 MED ORDER — METRONIDAZOLE 500 MG/100ML IV SOLN: 500.0000 mg | Freq: Once | INTRAVENOUS | Status: DC

## 2022-12-28 MED ORDER — LIOTHYRONINE SODIUM 5 MCG PO TABS
7.5000 ug | ORAL_TABLET | Freq: Two times a day (BID) | ORAL | Status: DC
  Administered 2022-12-29 – 2022-12-30 (×4): 7.5 ug
  Filled 2022-12-28 (×6): qty 2

## 2022-12-28 MED ORDER — LEVOTHYROXINE SODIUM 100 MCG PO TABS
100.0000 ug | ORAL_TABLET | Freq: Every day | ORAL | Status: DC
  Administered 2022-12-29 – 2022-12-30 (×2): 100 ug
  Filled 2022-12-28 (×2): qty 1

## 2022-12-28 MED ORDER — SODIUM CHLORIDE 0.9% FLUSH
10.0000 mL | Freq: Two times a day (BID) | INTRAVENOUS | Status: DC
  Administered 2022-12-29: 10 mL

## 2022-12-28 MED ORDER — SODIUM CHLORIDE 0.9 % IV SOLN: 250.0000 mL | INTRAVENOUS | Status: DC

## 2022-12-28 MED ORDER — LEVETIRACETAM 100 MG/ML PO SOLN
250.0000 mg | Freq: Two times a day (BID) | ORAL | Status: DC
  Administered 2022-12-29 – 2022-12-30 (×4): 250 mg
  Filled 2022-12-28 (×5): qty 5

## 2022-12-28 MED ORDER — IBUPROFEN 100 MG/5ML PO SUSP
600.0000 mg | Freq: Once | ORAL | Status: AC
  Administered 2022-12-28: 600 mg via ORAL
  Filled 2022-12-28: qty 30

## 2022-12-28 MED ORDER — VANCOMYCIN HCL IN DEXTROSE 1-5 GM/200ML-% IV SOLN
1000.0000 mg | Freq: Two times a day (BID) | INTRAVENOUS | Status: DC
  Administered 2022-12-29 – 2022-12-30 (×3): 1000 mg via INTRAVENOUS
  Filled 2022-12-28 (×4): qty 200

## 2022-12-28 MED ORDER — VITAL HIGH PROTEIN PO LIQD
1000.0000 mL | ORAL | Status: DC
  Administered 2022-12-29: 1000 mL

## 2022-12-28 NOTE — ED Triage Notes (Signed)
Presenting from [facility] for [facility], staff reported pt is usually more interactive to outside stimuli. Staff collected blood work and started septic workup. Given ~1800cc of NS and tylenol PTA. Had an axillary temp of 101. Initially VS- 92/40, CBG 202, Hr 128 and sinus, 92% venturi mask. R upper arm PICC.

## 2022-12-28 NOTE — Progress Notes (Signed)
At bedside for PICC assessment. Unable to determine facility placement, however, CXR verifies tip placement in the CAJ. Site intact and patent. Properly dressed with biopatch noted and in date. Line at 1cm. Flushes easily with good blood return. Routine line care orders placed and EDRN aware.

## 2022-12-28 NOTE — ED Notes (Signed)
Wife updated on pt status and POC
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
## 2022-12-28 NOTE — Sepsis Progress Note (Signed)
Sepsis protocol is being followed by [facility]. 

## 2022-12-28 NOTE — ED Provider Notes (Signed)
[facility] EMERGENCY DEPARTMENT Provider Note   CSN: [id number] Arrival date & time: 12/28/22  1528     History Chief Complaint  Patient presents with   Code Sepsis    STYLIANOS Haley is a 72 y.o. male  with h/o TBI, aphasia, chronic trach and PEG and indwelling Foley catheter in place presenting to the emergency department from [facility] for evaluation of fever and somnolence. Fever was 101F axillary with [facility]. He was recently treated for PNA with meropenum and vancomycin. Per EMS he has already received 2L of NS and was given Tylenol at some point earlier today. Per EMS he did respond wih deep suctioning. Level 5 caveat due to patient's condition.   HPI     Home Medications Prior to Admission medications   Medication Sig Start Date End Date Taking? Authorizing Provider  amantadine (SYMMETREL) 50 MG/5ML solution Place 100 mg into feeding tube daily.   Yes [provider]  Amino Acids-Protein Hydrolys (PRO-STAT AWC) LIQD Place 30 mLs into feeding tube daily.   Yes [provider]  ascorbic acid (VITAMIN C) 500 MG tablet Place 500 mg into feeding tube daily. Give 500mg  per tube daily   Yes [provider]  enoxaparin (LOVENOX) 40 MG/0.4ML injection Inject 40 mg into the skin daily.   Yes [provider]  ferrous sulfate 300 (60 Fe) MG/5ML syrup Place 300 mg into feeding tube 2 (two) times daily.   Yes [provider]  furosemide (LASIX) 20 MG tablet Place 20 mg into feeding tube daily.   Yes [provider]  insulin aspart (NOVOLOG) 100 UNIT/ML injection insulin aspart (novoLOG) injection 0-15 Units 0-15 Units, Subcutaneous, Every 4 hours,  CBG < 70: Implement Hypoglycemia Standing Orders and refer to Hypoglycemia Standing Orders sidebar report CBG 70 - 120: 0 units CBG 121 - 150: 2 units CBG 151 - 200: 3 units CBG 201 - 250: 5 units CBG 251 - 300: 8 units CBG 301 - 350: 11 units CBG 351 - 400: 15 units CBG >  400: call MD Patient taking differently: Inject 0-21 Units into the skin in the morning, at noon, in the evening, and at bedtime. Sliding scale insulin: Blood sugar is <60 or >500, notify MD. Blood sugar is 0-150= 0 units Blood sugar is 151-200= 3 units Blood sugar is 201-250= 6 units Blood sugar is 251-300= 9 units Blood sugar is 301-350= 12 units Blood sugar is 351-400= 15 units Blood sugar is 401-450= 18 units Blood sugar is 451-500= 21 units 09/25/22  Yes Ghimire, Henreitta Leber, MD  insulin glargine (LANTUS) 100 UNIT/ML injection Inject 0.25 mLs (25 Units total) into the skin daily. 09/25/22  Yes Ghimire, Henreitta Leber, MD  lansoprazole (PREVACID) 30 MG capsule Take 30 mg by mouth daily at 12 noon.   Yes [provider]  levothyroxine (SYNTHROID) 100 MCG tablet Place 100 mcg into feeding tube daily. Give 113mcg per tube daily   Yes [provider]  liothyronine (CYTOMEL) 5 MCG tablet Place 7.5 mcg into feeding tube every 12 (twelve) hours. Give one and one half tablet (7.5mg ) per tube every 12 hours   Yes [provider]  Methylcellulose, Laxative, (CITRUCEL) 500 MG TABS Place 500 mg into feeding tube 3 (three) times daily.   Yes [provider]  modafinil (PROVIGIL) 100 MG tablet Place 100 mg into feeding tube daily.   Yes [provider]  multivitamin (ONE-A-DAY MEN'S) TABS tablet 1 tablet daily. Give 1 tablet  per tube daily   Yes [provider]  Nutritional Supplements (ARGINAID) PACK Place 1 packet into feeding tube 2 (two) times daily. Arginaid 4.5gram-156mg /9.2 gram oral powder packet   Yes [provider]  Nutritional Supplements (FEEDING SUPPLEMENT, GLUCERNA 1.5 CAL,) LIQD Place 60 mL/hr into feeding tube See admin instructions. Give 21mL/hr per tube by shift   Yes [provider]  omega-3 acid ethyl esters (LOVAZA) 1 g capsule Place 1 g into feeding tube daily. Give 1 g per tube daily   Yes [provider]   ondansetron (ZOFRAN-ODT) 4 MG disintegrating tablet Take 4 mg by mouth every 8 (eight) hours as needed for nausea or vomiting.   Yes [provider]  polyethylene glycol (MIRALAX / GLYCOLAX) 17 g packet Give 17g per tube daily as needed for constipation   Yes [provider]  sertraline (ZOLOFT) 25 MG tablet Place 25 mg into feeding tube daily. Give 25mg  per tube daily   Yes [provider]  sodium chloride irrigation 0.9 % irrigation Irrigate with 60 mLs as directed every 8 (eight) hours. Irrigate indwelling foley with 67mL sterile saline every 8 hours.   Yes [provider]  SODIUM CHLORIDE IV Inject 75 mL/hr into the vein daily. Sodium chloride 0.9% intravenous solution (87mL/hr) intravenous one time daily for one day. 12/28/22 12/29/22 Yes [provider]  cephALEXin (KEFLEX) 500 MG capsule Take 1 capsule (500 mg total) by mouth 4 (four) times daily. Patient not taking: Reported on 12/28/2022 12/12/22   Fredia Sorrow, MD  levETIRAcetam (KEPPRA) 100 MG/ML solution Place 250 mg into feeding tube every 12 (twelve) hours. Patient not taking: Reported on 12/28/2022    [provider]  nutrition supplement, JUVEN, (JUVEN) PACK Place 1 packet into feeding tube 2 (two) times daily between meals. Patient not taking: Reported on 12/28/2022 09/25/22   Jonetta Osgood, MD  Water For Irrigation, Sterile (FREE WATER) SOLN Place 200 mLs into feeding tube every 8 (eight) hours. Patient not taking: Reported on 12/28/2022 09/25/22   Jonetta Osgood, MD      Allergies    Morphine and related, Penicillins, and Statins    Review of Systems   Review of Systems  Unable to perform ROS: Patient nonverbal    Physical Exam Updated Vital Signs BP (!) 100/56   Pulse (!) 104   Temp 99.9 F (37.7 C) (Rectal)   Resp (!) 27   SpO2 (!) 89%  Physical Exam Vitals and nursing note reviewed.  Constitutional:      Appearance: He is ill-appearing. He is not  diaphoretic.     Comments: Moves mouth when stuck with IV. Otherwise does not respond with voice or touch.   HENT:     Mouth/Throat:     Mouth: Mucous membranes are dry.     Comments: Dry, cracked lips Neck:     Comments: Trach in place with thick green secretions.  Cardiovascular:     Rate and Rhythm: Tachycardia present.  Pulmonary:     Breath sounds: Rhonchi present.     Comments: Rhonchi heard bilaterally. Slight tachypnea, without accessory muscle use.  Abdominal:     Palpations: Abdomen is soft.  Musculoskeletal:     Comments: Edema seen to hands and legs bilaterally. Pillows on feet.   Skin:    General: Skin is warm and dry.  Neurological:     Comments: Patient has h/o brain injury and is nonverbal at baseline. He is somnolent and does not awaken  to sound or touch. Did mildly wince with painful stimuli.      ED Results / Procedures / Treatments   Labs (all labs ordered are listed, but only abnormal results are displayed) Labs Reviewed  LACTIC ACID, PLASMA - Abnormal; Notable for the following components:      Result Value   Lactic Acid, Venous 3.6 (*)    All other components within normal limits  COMPREHENSIVE METABOLIC PANEL - Abnormal; Notable for the following components:   Sodium 155 (*)    Potassium 2.9 (*)    Chloride 117 (*)    Glucose, Bld 347 (*)    BUN 40 (*)    Calcium 8.7 (*)    Total Protein 6.3 (*)    Albumin 1.8 (*)    All other components within normal limits  CBC WITH DIFFERENTIAL/PLATELET - Abnormal; Notable for the following components:   WBC 11.9 (*)    MCHC 28.9 (*)    RDW 16.8 (*)    Neutro Abs 9.7 (*)    All other components within normal limits  PROTIME-INR - Abnormal; Notable for the following components:   Prothrombin Time 15.3 (*)    All other components within normal limits  APTT - Abnormal; Notable for the following components:   aPTT 37 (*)    All other components within normal limits  URINALYSIS, ROUTINE W REFLEX MICROSCOPIC  - Abnormal; Notable for the following components:   APPearance CLOUDY (*)    Protein, ur 30 (*)    Leukocytes,Ua SMALL (*)    All other components within normal limits  URINALYSIS, MICROSCOPIC (REFLEX) - Abnormal; Notable for the following components:   Bacteria, UA RARE (*)    All other components within normal limits  RESP PANEL BY RT-PCR (RSV, FLU A&B, COVID)  RVPGX2  CULTURE, BLOOD (ROUTINE X 2)  CULTURE, BLOOD (ROUTINE X 2)  URINE CULTURE  TSH  LACTIC ACID, PLASMA  LACTIC ACID, PLASMA    EKG EKG Interpretation  Date/Time:  Monday December 28 2022 15:39:40 EST Ventricular Rate:  126 PR Interval:  123 QRS Duration: 91 QT Interval:  332 QTC Calculation: 481 R Axis:   103 Text Interpretation: Right and left arm electrode reversal, interpretation assumes no reversal Sinus tachycardia Consider left ventricular hypertrophy Inferior infarct, old Anterolateral infarct, age indeterminate Confirmed by Gloris Manchester [phone]) on 12/28/2022 4:22:33 PM  Radiology DG Chest Port 1 View  Result Date: 12/28/2022 CLINICAL DATA:  Altered mental status EXAM: PORTABLE CHEST 1 VIEW COMPARISON:  09/21/2022 and prior radiographs FINDINGS: Tracheostomy tube and CABG changes again noted. A RIGHT PICC line is present with tip overlying the SUPERIOR cavoatrial junction. Bibasilar opacities are present. There is no evidence of pneumothorax or large pleural effusion. No acute bony abnormalities are identified. IMPRESSION: Bibasilar opacities which may represent atelectasis or airspace disease/pneumonia. Electronically Signed   By: Harmon Pier M.D.   On: 12/28/2022 16:19    Procedures .Critical Care  Performed by: Achille Rich, PA-C Authorized by: Achille Rich, PA-C   Critical care provider statement:    Critical care time (minutes):  70   Critical care was necessary to treat or prevent imminent or life-threatening deterioration of the following conditions:  Sepsis   Critical care was time spent personally by  me on the following activities:  Development of treatment plan with patient or surrogate, discussions with consultants, evaluation of patient's response to treatment, examination of patient, ordering and review of laboratory studies, ordering and review of radiographic studies, ordering  and performing treatments and interventions, pulse oximetry, re-evaluation of patient's condition, review of old charts and obtaining history from patient or surrogate   Care discussed with: admitting provider      Medications Ordered in ED Medications  lactated ringers infusion ( Intravenous New Bag/Given 12/28/22 1642)  sodium chloride flush (NS) 0.9 % injection 10-40 mL (has no administration in time range)  sodium chloride flush (NS) 0.9 % injection 10-40 mL (has no administration in time range)  Chlorhexidine Gluconate Cloth 2 % PADS 6 each (0 each Topical Hold 12/28/22 1706)  meropenem (MERREM) 1 g in sodium chloride 0.9 % 100 mL IVPB (has no administration in time range)  vancomycin (VANCOCIN) IVPB 1000 mg/200 mL premix (has no administration in time range)  lactated ringers bolus 500 mL (0 mLs Intravenous Stopped 12/28/22 1642)  ibuprofen (ADVIL) 100 MG/5ML suspension 600 mg (600 mg Oral Given 12/28/22 1611)  meropenem (MERREM) 1 g in sodium chloride 0.9 % 100 mL IVPB (0 g Intravenous Stopped 12/28/22 1710)  vancomycin (VANCOREADY) IVPB 1500 mg/300 mL (0 mg Intravenous Stopped 12/28/22 1843)  lactated ringers bolus 1,000 mL (1,000 mLs Intravenous New Bag/Given 12/28/22 1710)    ED Course/ Medical Decision Making/ A&P Clinical Course as of 12/28/22 1956  Mon Dec 28, 2022  1611 Per EMS, the patient received an unknown amount of Tylenol around 1400. Motrin suspension ordered [RR]  1745 Baseline is awake, but nonverbal. Sometimes he will move his right upper extremities. He was very somnolent last week, but felt like it worsened today. He was on meropenum and vancomycin for PNA. She gave him 2L NS and Tylenol. T-Bar on  40% is his normal, but not ventilated. Sherita, RN is who I am speaking to at [facility].  [RR]  1822 Spoke with Nehemiah Settle with CCM who will come down to evaluate the patient.  [RR]    Clinical Course User Index [RR] Achille Rich, PA-C                           Medical Decision Making Amount and/or Complexity of Data Reviewed Labs: ordered. Radiology: ordered. ECG/medicine tests: ordered.  Risk OTC drugs. Prescription drug management. Decision regarding hospitalization.   72 year old male presents to the Emergency Department for evaluation of fever.  Differential diagnosis includes limited to sepsis, UTI, pneumonia, intra-abdominal etiology, viral illness, COVID, flu, RSV.  Vital signs show blood pressure 156, patient's temperature is 103.4, tachycardic, mildly tachypneic satting 94% on his Venturi mask at baseline.  Physical exam as noted above.  Patient meets sepsis criteria on arrival.  Sepsis workup initiated.  Broad-spectrum antibiotics ordered.  With consultation of Rolan Bucco [facility], please change the patient's medications to include meropenem and vancomycin given his cultures.  The patient had already received 2 L of normal saline before arriving to the ER.  I did order him another 500 mL of LR to complete the sepsis fluid requirement. Motrin suspension ordered for fever as he has had tylenol already.   I independently reviewed and interpreted the patient's labs.  Urinalysis shows cloudy urine with 30 amount of protein and small leukocytes.  There is rare bacteria but 11-20 white blood cells with white blood cell clumps, budding yeast, and Ca oxalate crystals.  CBC shows mild leukocytosis at 11.9 with a left shift.  No anemia.  Normal platelets. CMP shows hypernatremia at 155, hypokalemia 2.9.  Chloride is elevated at 117.  Glucose elevated as well.  BUN 40.  Decreased  calcium, total protein, and albumin.  Elevated APTT 37.  Elevated PT of 15.3 otherwise normal INR.  Negative for COVID, flu, RSV.   Patient's lactic acid at 3.6.  CXR shows Bibasilar opacities which may represent atelectasis or airspace disease/pneumonia.  I spoke with [facility] given the limited history. Please see ED course.   Source is likely urinary and pulmonary. Given his fever and electrolyte abnormalities, he will need to be admitted. CCM to admit.   I discussed this case with my attending physician who cosigned this note including patient's presenting symptoms, physical exam, and planned diagnostics and interventions. Attending physician stated agreement with plan or made changes to plan which were implemented.   Attending physician assessed patient at bedside.  Final Clinical Impression(s) / ED Diagnoses Final diagnoses:  Sepsis, due to unspecified organism, unspecified whether acute organ dysfunction present (HCC)  Pneumonia of both lower lobes due to infectious organism  Hypernatremia  Hypokalemia    Rx / DC Orders ED Discharge Orders     None         Achille Rich, Cordelia Poche 12/28/22 2025    Gloris Manchester, MD 12/29/22 2259

## 2022-12-28 NOTE — ED Notes (Signed)
RT called for drop in oxygen saturation - now maintaining consistently at 88-89%. Last BP met criteria for initiating Levophed. RN to start pressers when verified by pharmacy.

## 2022-12-28 NOTE — Sepsis Progress Note (Signed)
Repeat Lactic acid still has not resulted. Followed up with Bedside RN

## 2022-12-28 NOTE — Progress Notes (Signed)
Pharmacy Antibiotic Note  Joshua Haley is a 72 y.o. male admitted on 12/28/2022 with sepsis.  Pharmacy has been consulted for Vancomycin and Meropenem dosing. Pt from [facility] -completed course of Vanc/Merrem on 12/26 for PNA. Respiratory culture there grew Ecoli (probably ESBL), serratia marcescens, and pseudomonas. All sensitive to carbapenem.  Wt ~81 kg. Estimate CrCl ~81ml/min  Plan: Merrem 1gm IV q8h (x 7 days) Vancomycin 1500mg  IV now then 1000 mg IV Q 12 hrs (X 7 days).  Goal AUC 400-550. Expected AUC: 416, SCr used: 0.8 Will f/u renal function, micro data, and pt's clinical condition Vanc levels prn      Temp (24hrs), Avg:103.4 F (39.7 C), Min:103.4 F (39.7 C), Max:103.4 F (39.7 C)  Recent Labs  Lab 12/28/22 1550  WBC 11.9*  CREATININE 0.67  LATICACIDVEN 3.6*    CrCl cannot be calculated (Unknown ideal weight.).    Allergies  Allergen Reactions   Morphine And Related    Penicillins Other (See Comments)   Statins Nausea And Vomiting    Antimicrobials this admission: 1/1 Vanc >> 1/7 1/1 Merrem >> 1/7  Microbiology results: 1/1 BCx:  1/1 UCx:    Thank you for allowing pharmacy to be a part of this patient's care.  Sherlon Handing, PharmD, BCPS Please see amion for complete clinical pharmacist phone list 12/28/2022 4:52 PM

## 2022-12-28 NOTE — ED Notes (Signed)
PA Ransom notified of Critical lactic 3.4

## 2022-12-29 DIAGNOSIS — A419 Sepsis, unspecified organism: Secondary | ICD-10-CM | POA: Diagnosis not present

## 2022-12-29 LAB — BASIC METABOLIC PANEL
Anion gap: 11 (ref 5–15)
Anion gap: 7 (ref 5–15)
BUN: 36 mg/dL — ABNORMAL HIGH (ref 8–23)
BUN: 45 mg/dL — ABNORMAL HIGH (ref 8–23)
CO2: 27 mmol/L (ref 22–32)
CO2: 28 mmol/L (ref 22–32)
Calcium: 8.3 mg/dL — ABNORMAL LOW (ref 8.9–10.3)
Calcium: 8.7 mg/dL — ABNORMAL LOW (ref 8.9–10.3)
Chloride: 116 mmol/L — ABNORMAL HIGH (ref 98–111)
Chloride: 116 mmol/L — ABNORMAL HIGH (ref 98–111)
Creatinine, Ser: 0.49 mg/dL — ABNORMAL LOW (ref 0.61–1.24)
Creatinine, Ser: 0.62 mg/dL (ref 0.61–1.24)
GFR, Estimated: >60 mL/min (ref >60)
GFR, Estimated: >60 mL/min (ref >60)
Glucose, Bld: 265 mg/dL — ABNORMAL HIGH (ref 70–99)
Glucose, Bld: 325 mg/dL — ABNORMAL HIGH (ref 70–99)
Potassium: 3.2 mmol/L — ABNORMAL LOW (ref 3.5–5.1)
Potassium: 3.6 mmol/L (ref 3.5–5.1)
Sodium: 151 mmol/L — ABNORMAL HIGH (ref 135–145)
Sodium: 154 mmol/L — ABNORMAL HIGH (ref 135–145)

## 2022-12-29 LAB — POCT I-STAT 7, (LYTES, BLD GAS, ICA,H+H)
Acid-Base Excess: 6 mmol/L — ABNORMAL HIGH (ref 0.0–2.0)
Bicarbonate: 30.4 mmol/L — ABNORMAL HIGH (ref 20.0–28.0)
Calcium, Ion: 1.32 mmol/L (ref 1.15–1.40)
HCT: 32 % — ABNORMAL LOW (ref 39.0–52.0)
Hemoglobin: 10.9 g/dL — ABNORMAL LOW (ref 13.0–17.0)
O2 Saturation: 97 %
Patient temperature: 99.4
Potassium: 3.6 mmol/L (ref 3.5–5.1)
Sodium: 155 mmol/L — ABNORMAL HIGH (ref 135–145)
TCO2: 32 mmol/L (ref 22–32)
pCO2 arterial: 41.8 mmHg (ref 32–48)
pH, Arterial: 7.472 — ABNORMAL HIGH (ref 7.35–7.45)
pO2, Arterial: 88 mmHg (ref 83–108)

## 2022-12-29 LAB — GLUCOSE, CAPILLARY
Glucose-Capillary: 267 mg/dL — ABNORMAL HIGH (ref 70–99)
Glucose-Capillary: 248 mg/dL — ABNORMAL HIGH (ref 70–99)
Glucose-Capillary: 266 mg/dL — ABNORMAL HIGH (ref 70–99)
Glucose-Capillary: 223 mg/dL — ABNORMAL HIGH (ref 70–99)
Glucose-Capillary: 211 mg/dL — ABNORMAL HIGH (ref 70–99)
Glucose-Capillary: 217 mg/dL — ABNORMAL HIGH (ref 70–99)

## 2022-12-29 LAB — CBC
HCT: 41.6 % (ref 39.0–52.0)
Hemoglobin: 12.5 g/dL — ABNORMAL LOW (ref 13.0–17.0)
MCH: 29.1 pg (ref 26.0–34.0)
MCHC: 30 g/dL (ref 30.0–36.0)
MCV: 96.7 fL (ref 80.0–100.0)
Platelets: 191 10*3/uL (ref 150–400)
RBC: 4.3 MIL/uL (ref 4.22–5.81)
RDW: 16.7 % — ABNORMAL HIGH (ref 11.5–15.5)
WBC: 11.5 10*3/uL — ABNORMAL HIGH (ref 4.0–10.5)
nRBC: 0 % (ref 0.0–0.2)

## 2022-12-29 LAB — HEMOGLOBIN A1C
Hgb A1c MFr Bld: 9.2 % — ABNORMAL HIGH (ref 4.8–5.6)
Mean Plasma Glucose: 217 mg/dL

## 2022-12-29 LAB — MAGNESIUM
Magnesium: 1.8 mg/dL (ref 1.7–2.4)
Magnesium: 1.8 mg/dL (ref 1.7–2.4)

## 2022-12-29 LAB — PHOSPHORUS
Phosphorus: 1.1 mg/dL — ABNORMAL LOW (ref 2.5–4.6)
Phosphorus: 1.8 mg/dL — ABNORMAL LOW (ref 2.5–4.6)

## 2022-12-29 LAB — C DIFFICILE QUICK SCREEN W PCR REFLEX
C Diff antigen: NEGATIVE
C Diff interpretation: NOT DETECTED
C Diff toxin: NEGATIVE

## 2022-12-29 LAB — MRSA NEXT GEN BY PCR, NASAL: MRSA by PCR Next Gen: NOT DETECTED

## 2022-12-29 LAB — LACTIC ACID, PLASMA: Lactic Acid, Venous: 3.4 mmol/L (ref 0.5–1.9)

## 2022-12-29 MED ORDER — POTASSIUM CHLORIDE 10 MEQ/100ML IV SOLN
10.0000 meq | INTRAVENOUS | Status: AC
  Administered 2022-12-29 (×4): 10 meq via INTRAVENOUS
  Filled 2022-12-29 (×4): qty 100

## 2022-12-29 MED ORDER — ENOXAPARIN SODIUM 40 MG/0.4ML IJ SOSY
40.0000 mg | PREFILLED_SYRINGE | INTRAMUSCULAR | Status: DC
  Administered 2022-12-29 – 2022-12-30 (×2): 40 mg via SUBCUTANEOUS
  Filled 2022-12-29 (×2): qty 0.4

## 2022-12-29 MED ORDER — ORAL CARE MOUTH RINSE: 15.0000 mL | OROMUCOSAL | Status: DC | PRN

## 2022-12-29 MED ORDER — FREE WATER
300.0000 mL | Freq: Four times a day (QID) | Status: DC
  Administered 2022-12-29 – 2022-12-30 (×5): 300 mL

## 2022-12-29 MED ORDER — FREE WATER
100.0000 mL | Status: DC
  Administered 2022-12-29 (×2): 100 mL

## 2022-12-29 MED ORDER — VITAMIN C 500 MG PO TABS
500.0000 mg | ORAL_TABLET | Freq: Every day | ORAL | Status: DC
  Administered 2022-12-29 – 2022-12-30 (×2): 500 mg
  Filled 2022-12-29 (×2): qty 1

## 2022-12-29 MED ORDER — MODAFINIL 100 MG PO TABS
100.0000 mg | ORAL_TABLET | Freq: Every day | ORAL | Status: DC
  Administered 2022-12-29 – 2022-12-30 (×2): 100 mg
  Filled 2022-12-29 (×2): qty 1

## 2022-12-29 MED ORDER — ALBUTEROL SULFATE (2.5 MG/3ML) 0.083% IN NEBU
2.5000 mg | INHALATION_SOLUTION | RESPIRATORY_TRACT | Status: DC | PRN
  Administered 2022-12-29 – 2022-12-30 (×4): 2.5 mg via RESPIRATORY_TRACT
  Filled 2022-12-29 (×4): qty 3

## 2022-12-29 MED ORDER — PRO-STAT AWC PO LIQD: 30.0000 mL | Freq: Every day | ORAL | Status: DC

## 2022-12-29 MED ORDER — MAGNESIUM SULFATE 2 GM/50ML IV SOLN
2.0000 g | Freq: Once | INTRAVENOUS | Status: AC
  Administered 2022-12-29: 2 g via INTRAVENOUS
  Filled 2022-12-29: qty 50

## 2022-12-29 MED ORDER — OMEGA-3-ACID ETHYL ESTERS 1 G PO CAPS
1.0000 g | ORAL_CAPSULE | Freq: Every day | ORAL | Status: DC
  Administered 2022-12-29: 1 g
  Filled 2022-12-29 (×3): qty 1

## 2022-12-29 MED ORDER — GLUCERNA 1.5 CAL PO LIQD
65.0000 mL/h | ORAL | Status: DC
  Administered 2022-12-30: 65 mL/h
  Filled 2022-12-29 (×4): qty 1000

## 2022-12-29 MED ORDER — JUVEN PO PACK
1.0000 | PACK | Freq: Two times a day (BID) | ORAL | Status: DC
  Administered 2022-12-29 – 2022-12-30 (×3): 1
  Filled 2022-12-29 (×3): qty 1

## 2022-12-29 MED ORDER — SERTRALINE HCL 50 MG PO TABS
25.0000 mg | ORAL_TABLET | Freq: Every day | ORAL | Status: DC
  Administered 2022-12-29 – 2022-12-30 (×2): 25 mg
  Filled 2022-12-29 (×2): qty 1

## 2022-12-29 MED ORDER — GLUCERNA 1.5 CAL PO LIQD
60.0000 mL/h | Freq: Two times a day (BID) | ORAL | Status: DC
  Administered 2022-12-29: 60 mL/h
  Filled 2022-12-29: qty 1000

## 2022-12-29 MED ORDER — FERROUS SULFATE 75 (15 FE) MG/ML PO SOLN
300.0000 mg | Freq: Two times a day (BID) | ORAL | Status: DC
  Administered 2022-12-29 – 2022-12-30 (×3): 300 mg
  Filled 2022-12-29 (×4): qty 4

## 2022-12-29 MED ORDER — POTASSIUM PHOSPHATES 15 MMOLE/5ML IV SOLN
15.0000 mmol | Freq: Once | INTRAVENOUS | Status: AC
  Administered 2022-12-29: 15 mmol via INTRAVENOUS
  Filled 2022-12-29: qty 5

## 2022-12-29 MED ORDER — MEDIHONEY WOUND/BURN DRESSING EX PSTE
1.0000 | PASTE | Freq: Every day | CUTANEOUS | Status: DC
  Administered 2022-12-29 – 2022-12-30 (×2): 1 via TOPICAL
  Filled 2022-12-29: qty 44

## 2022-12-29 MED ORDER — ORAL CARE MOUTH RINSE
15.0000 mL | OROMUCOSAL | Status: DC
  Administered 2022-12-29 – 2022-12-30 (×6): 15 mL via OROMUCOSAL

## 2022-12-29 MED ORDER — ADULT MULTIVITAMIN LIQUID CH
15.0000 mL | Freq: Every day | ORAL | Status: DC
  Administered 2022-12-29 – 2022-12-30 (×2): 15 mL
  Filled 2022-12-29 (×2): qty 15

## 2022-12-29 MED ORDER — INSULIN GLARGINE-YFGN 100 UNIT/ML ~~LOC~~ SOLN
14.0000 [IU] | Freq: Every day | SUBCUTANEOUS | Status: DC
  Administered 2022-12-29: 14 [IU] via SUBCUTANEOUS
  Filled 2022-12-29 (×2): qty 0.14

## 2022-12-29 MED ORDER — AMANTADINE HCL 50 MG/5ML PO SOLN
100.0000 mg | Freq: Every day | ORAL | Status: DC
  Administered 2022-12-29 – 2022-12-30 (×2): 100 mg
  Filled 2022-12-29 (×2): qty 10

## 2022-12-29 NOTE — Consult Note (Signed)
WOC Consult requested for pressure injuries.  This was already performed this morning at 0815.  Please refer to previous consult note for wound assessment and measurements, and topical treatment orders have been provided for bedside nurses to perform.  Please re-consult if further assistance is needed.  Thank-you,  Julien Girt MSN, [place], [place], [place], [place]

## 2022-12-29 NOTE — Progress Notes (Signed)
Initial Nutrition Assessment  DOCUMENTATION CODES:  Non-severe (moderate) malnutrition in context of social or environmental circumstances  INTERVENTION:  Continue tube feeding via PEG: Glucerna 1.5 at 65 ml/h (1560 ml per day) Free water flush 322mL q6h Provides 2340 kcal, 129 gm protein, 1184 ml free water daily (2375mL/d = TF+flush) MVI with minerals daily 1 packet Juven BID, each packet provides 95 calories, 2.5 grams of protein (collagen), and 9.8 grams of carbohydrate (3 grams sugar); also contains 7 grams of L-arginine and L-glutamine, 300 mg vitamin C, 15 mg vitamin E, 1.2 mcg vitamin B-12, 9.5 mg zinc, 200 mg calcium, and 1.5 g  Calcium Beta-hydroxy-Beta-methylbutyrate to support wound healing  NUTRITION DIAGNOSIS:  Moderate Malnutrition related to social / environmental circumstances as evidenced by mild fat depletion, mild muscle depletion.  GOAL:  Patient will meet greater than or equal to 90% of their needs  MONITOR:  Labs, Weight trends, I & O's, TF tolerance  REASON FOR ASSESSMENT:  Consult Enteral/tube feeding initiation and management  ASSESSMENT:  Pt with hx of TBI s/p PEG and trach, DM type 2 and HTN presented to ED from [facility] with AMS and fever.   Pt resting in bed at the time of assessment. Nonverbal and no family present to provide a history.   Pt was followed by RD during previous admission in September. Reviewed physical exam from that visit and deficits in muscle and fat stores still remain but have not worsened. Did note a 4.7% weight loss since last admission ~ 3 months ago which is not severe but worrisome as could indicate insufficient enteral infusion. Also noted the presence of wounds on admission.  Two formulas ordered currently as protocol was entered, will adjust. Discussed with RN.  Temp (24hrs), Avg:100 F (37.8 C), Min:98.6 F (37 C), Max:103.4 F (39.7 C)   Intake/Output Summary (Last 24 hours) at 12/29/2022 1235 Last data filed at  12/29/2022 1000 Gross per 24 hour  Intake 4671.36 ml  Output 525 ml  Net 4146.36 ml   Net IO Since Admission: 4,146.36 mL [12/29/22 1235]  Nutritionally Relevant Medications: Scheduled Meds:  amantadine  100 mg Per Tube Daily   ascorbic acid  500 mg Per Tube Daily   feeding supplement (GLUCERNA 1.5 CAL)  60 mL/hr Per Tube Q12H   feeding supplement (PROSource TF20)  60 mL Per Tube Daily   feeding supplement (VITAL HIGH PROTEIN)  1,000 mL Per Tube Q24H   ferrous sulfate  300 mg Per Tube BID   free water  300 mL Per Tube Q6H   insulin aspart  0-15 Units Subcutaneous Q4H   insulin glargine-yfgn  14 Units Subcutaneous Daily   multivitamin  15 mL Per Tube Daily   omega-3 acid ethyl esters  1 g Per Tube Daily   Continuous Infusions:  lactated ringers 150 mL/hr at 12/29/22 0623   meropenem (MERREM) IV 1 g (12/29/22 0755)   norepinephrine (LEVOPHED) Adult infusion 2 mcg/min (12/29/22 0500)   vancomycin 1,000 mg (12/29/22 0700)   PRN Meds: docusate sodium, polyethylene glycol  Labs Reviewed: Na 151, Chloride 116 BUN 36, creatinine 0.49 Phosphorus 1.8  CBG ranges from 248-332 mg/dL over the last 24 hours HgbA1c 7.5% (06/29/22)  NUTRITION - FOCUSED PHYSICAL EXAM: Flowsheet Row Most Recent Value  Orbital Region Mild depletion  Upper Arm Region No depletion  Thoracic and Lumbar Region No depletion  Buccal Region Mild depletion  Temple Region Moderate depletion  Clavicle Bone Region Mild depletion  Clavicle and Acromion Bone  Region Mild depletion  Scapular Bone Region No depletion  Dorsal Hand No depletion  [edema]  Patellar Region Severe depletion  Anterior Thigh Region Severe depletion  Posterior Calf Region Severe depletion  Edema (RD Assessment) Mild  [arms/hands]  Hair Reviewed  Eyes Reviewed  Mouth Reviewed  Skin Reviewed  Nails Reviewed  [horizontal ridges]   Diet Order:   Diet Order             Diet NPO time specified  Diet effective now                    EDUCATION NEEDS:   Not appropriate for education at this time  Skin:  Skin Assessment: Reviewed RN Assessment Per WOC: Unstageable: Right hip 2 cm x 2 cm, Left ischial tuberosity 9 cm x 5 cm  Stage 3: Sacrum:  12 cm x 19 cm slough to wound bed  Last BM:  1/2 - type 7  Height:  Ht Readings from Last 1 Encounters:  12/29/22 5\' 11"  (1.803 m)    Weight:  Wt Readings from Last 1 Encounters:  12/29/22 77.8 kg    Ideal Body Weight:  78.2 kg  BMI:  Body mass index is 23.92 kg/m.  Estimated Nutritional Needs:  Kcal:  2100-2500 kcal/d Protein:  110-130g/d Fluid:  2.2-2.4L/d    Ranell Patrick, RD, LDN Clinical Dietitian RD pager # available in [facility]  After hours/weekend pager # available in [facility]

## 2022-12-29 NOTE — Consult Note (Signed)
WOC Nurse Consult Note: Reason for Consult:Pressure injuries in the setting of sepsis due to pneumonia. Resides in [facility] after TBI with trach Wound type: unstageable pressure injuries Pressure Injury POA: Yes Measurement: Right hip unstageable  2 cm x 2 cm slough to wound bed Left ischial tuberosity:  9 cm x 5 cm devitalized tissue to wound bed Sacrum:  12 cm x 19 cm slough to wound bed Wound bed: devitalized tissue Drainage (amount, consistency, odor) moderate serosanguinous   Periwound:intact Dressing procedure/placement/frequency: Cleanse wounds to right hip, left ischium and sacrum with NS and pat dry. Apply medihoney to devitalized tissue  Top with dry gauze and secure with ABD pad and tape or silicone foam if preferred  Change daily and PRN soilage.  Will not follow at this time.  Please re-consult if needed.  Estrellita Ludwig MSN, RN, FNP-BC CWON Wound, Ostomy, Continence Nurse [facility] [phone] Pager [phone]

## 2022-12-29 NOTE — Progress Notes (Signed)
[facility] Progress Note Patient Name: [name] DOB: 05-Mar-1951 MRN: [id number]   Date of Service  12/29/2022  HPI/Events of Note  Multiple loose stools with pressure sores noted. Nursing request for Flexiseal.   eICU Interventions  Plan: Place Flexiseal.      Intervention Category Major Interventions: Other:  Lysle Dingwall 12/29/2022, 7:32 PM

## 2022-12-29 NOTE — Progress Notes (Signed)
[facility] ADULT ICU REPLACEMENT PROTOCOL   The patient does apply for the [facility] Adult ICU Electrolyte Replacment Protocol based on the criteria listed below:   1.Exclusion criteria: TCTS, ECMO, Dialysis, and Myasthenia Gravis patients 2. Is GFR >/= 30 ml/min? Yes.    Patient's GFR today is >60 3. Is SCr </= 2? Yes.   Patient's SCr is 0.62 mg/dL 4. Did SCr increase >/= 0.5 in 24 hours? No. 5.Pt's weight >40kg  Yes.   6. Abnormal electrolyte(s):   Mg 1.8 (Phos referred to MD)  7. Electrolytes replaced per protocol 8.  Call MD STAT for K+ </= 2.5, Phos </= 1, or Mag </= 1 Physician:  E. Verdie Shire R Margarett Viti 12/29/2022 1:54 AM

## 2022-12-29 NOTE — Progress Notes (Addendum)
[facility] Progress Note Patient Name: Joshua Haley DOB: [birth year] MRN: [id number]   Date of Service  12/29/2022  HPI/Events of Note  RN reports flag in pt's chart requesting foley be exchanged.  Asking for order to replace foley. Mg 1.8, Phos 1.8  eICU Interventions  Foley to be replaced Ordered Kphos 15 mmol Mg being replaced     Intervention Category Intermediate Interventions: Electrolyte abnormality - evaluation and management;Other:  Judd Lien 12/29/2022, 2:25 AM

## 2022-12-29 NOTE — Progress Notes (Signed)
Inpatient Diabetes Program Recommendations  AACE/ADA: New Consensus Statement on Inpatient Glycemic Control (2015)  Target Ranges:  Prepandial:   less than 140 mg/dL      Peak postprandial:   less than 180 mg/dL (1-2 hours)      Critically ill patients:  140 - 180 mg/dL   Lab Results  Component Value Date   GLUCAP 248 (H) 12/29/2022   HGBA1C 7.5 (H) 06/29/2022    Review of Glycemic Control  Latest Reference Range & Units 12/28/22 22:26 12/28/22 23:46 12/29/22 03:12 12/29/22 07:30  Glucose-Capillary 70 - 99 mg/dL 332 (H) 286 (H) 267 (H) 248 (H)   Diabetes history: DM  Outpatient Diabetes medications:  Novolog 0-15 units q 4 hours Lantus 25 units daily Current orders for Inpatient glycemic control:  Semglee 14 units daily Novolog 0-15 units q 4 hours Glucerna 60 ml/hr  Inpatient Diabetes Program Recommendations:    Note that Semglee added today.  If blood sugars remain >180 mg/dL, consider adding Novolog tube feed coverage 3 units q 4 hours.   Thanks,  Adah Perl, RN, BC-ADM Inpatient Diabetes Coordinator Pager [phone]  (8a-5p)
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
## 2022-12-29 NOTE — Progress Notes (Signed)
Initial Nutrition Assessment  DOCUMENTATION CODES:  Non-severe (moderate) malnutrition in context of social or environmental circumstances  INTERVENTION:  Continue tube feeding via PEG: Glucerna 1.5 at 65 ml/h (1560 ml per day) Free water flush 378mL q6h Provides 2340 kcal, 129 gm protein, 1184 ml free water daily (2319mL/d = TF+flush) MVI with minerals daily  NUTRITION DIAGNOSIS:  Moderate Malnutrition related to social / environmental circumstances as evidenced by mild fat depletion, mild muscle depletion.  GOAL:  Patient will meet greater than or equal to 90% of their needs  MONITOR:  Labs, Weight trends, I & O's, TF tolerance  REASON FOR ASSESSMENT:  Consult Enteral/tube feeding initiation and management  ASSESSMENT:  Pt with hx of TBI s/p PEG and trach, DM type 2 and HTN presented [facility] ED from [facility] with AMS and fever.   Pt resting in bed at the time of assessment. Nonverbal and no family present to provide a history.   Pt was followed by RD during previous admission in September. Reviewed physical exam from that visit and deficits in muscle and fat stores still remain but have not worsened. Did note a 4.7% weight loss since last admission ~ 3 months ago which is not severe but worrisome as could indicate insufficient enteral infusion. Also noted the presence of wounds on admission.  Two formulas ordered currently as protocol was entered, will adjust. Discussed with RN.  Temp (24hrs), Avg:100 F (37.8 C), Min:98.6 F (37 C), Max:103.4 F (39.7 C)   Intake/Output Summary (Last 24 hours) at 12/29/2022 1234 Last data filed at 12/29/2022 1000 Gross per 24 hour  Intake 4671.36 ml  Output 525 ml  Net 4146.36 ml  Net IO Since Admission: 4,146.36 mL [12/29/22 1234]  Nutritionally Relevant Medications: Scheduled Meds:  amantadine  100 mg Per Tube Daily   ascorbic acid  500 mg Per Tube Daily   feeding supplement (GLUCERNA 1.5 CAL)  60 mL/hr Per Tube Q12H   feeding  supplement (PROSource TF20)  60 mL Per Tube Daily   feeding supplement (VITAL HIGH PROTEIN)  1,000 mL Per Tube Q24H   ferrous sulfate  300 mg Per Tube BID   free water  300 mL Per Tube Q6H   insulin aspart  0-15 Units Subcutaneous Q4H   insulin glargine-yfgn  14 Units Subcutaneous Daily   multivitamin  15 mL Per Tube Daily   omega-3 acid ethyl esters  1 g Per Tube Daily   Continuous Infusions:  lactated ringers 150 mL/hr at 12/29/22 0623   meropenem (MERREM) IV 1 g (12/29/22 0755)   norepinephrine (LEVOPHED) Adult infusion 2 mcg/min (12/29/22 0500)   vancomycin 1,000 mg (12/29/22 0700)   PRN Meds: docusate sodium, polyethylene glycol  Labs Reviewed: Na 151, Chloride 116 BUN 36, creatinine 0.49 Phosphorus 1.8  CBG ranges from 248-332 mg/dL over the last 24 hours HgbA1c 7.5% (06/29/22)  NUTRITION - FOCUSED PHYSICAL EXAM: Flowsheet Row Most Recent Value  Orbital Region Mild depletion  Upper Arm Region No depletion  Thoracic and Lumbar Region No depletion  Buccal Region Mild depletion  Temple Region Moderate depletion  Clavicle Bone Region Mild depletion  Clavicle and Acromion Bone Region Mild depletion  Scapular Bone Region No depletion  Dorsal Hand No depletion  [edema]  Patellar Region Severe depletion  Anterior Thigh Region Severe depletion  Posterior Calf Region Severe depletion  Edema (RD Assessment) Mild  [arms/hands]  Hair Reviewed  Eyes Reviewed  Mouth Reviewed  Skin Reviewed  Nails Reviewed  [horizontal ridges]  Diet Order:   Diet Order             Diet NPO time specified  Diet effective now                   EDUCATION NEEDS:   Not appropriate for education at this time  Skin:  Skin Assessment: Reviewed RN Assessment Per WOC: Unstageable: Right hip 2 cm x 2 cm, Left ischial tuberosity 9 cm x 5 cm  Stage 3: Sacrum:  12 cm x 19 cm slough to wound bed  Last BM:  1/2 - type 7  Height:  Ht Readings from Last 1 Encounters:  12/29/22 5\' 11"  (1.803  m)    Weight:  Wt Readings from Last 1 Encounters:  12/29/22 77.8 kg    Ideal Body Weight:  78.2 kg  BMI:  Body mass index is 23.92 kg/m.  Estimated Nutritional Needs:  Kcal:  2100-2500 kcal/d Protein:  110-130g/d Fluid:  2.2-2.4L/d    Ranell Patrick, RD, LDN Clinical Dietitian RD pager # available in [facility]  After hours/weekend pager # available in [facility]

## 2022-12-30 DIAGNOSIS — A419 Sepsis, unspecified organism: Secondary | ICD-10-CM | POA: Diagnosis not present

## 2022-12-30 LAB — BASIC METABOLIC PANEL
Anion gap: 9 (ref 5–15)
BUN: 21 mg/dL (ref 8–23)
CO2: 25 mmol/L (ref 22–32)
Calcium: 6.8 mg/dL — ABNORMAL LOW (ref 8.9–10.3)
Chloride: 116 mmol/L — ABNORMAL HIGH (ref 98–111)
Creatinine, Ser: 0.34 mg/dL — ABNORMAL LOW (ref 0.61–1.24)
GFR, Estimated: >60 mL/min (ref >60)
Glucose, Bld: 272 mg/dL — ABNORMAL HIGH (ref 70–99)
Potassium: 2.6 mmol/L — CL (ref 3.5–5.1)
Sodium: 150 mmol/L — ABNORMAL HIGH (ref 135–145)

## 2022-12-30 LAB — URINE CULTURE: Culture: 70000 — AB

## 2022-12-30 LAB — PHOSPHORUS: Phosphorus: 1.1 mg/dL — ABNORMAL LOW (ref 2.5–4.6)

## 2022-12-30 LAB — MAGNESIUM: Magnesium: 1.4 mg/dL — ABNORMAL LOW (ref 1.7–2.4)

## 2022-12-30 LAB — GLUCOSE, CAPILLARY
Glucose-Capillary: 288 mg/dL — ABNORMAL HIGH (ref 70–99)
Glucose-Capillary: 301 mg/dL — ABNORMAL HIGH (ref 70–99)
Glucose-Capillary: 234 mg/dL — ABNORMAL HIGH (ref 70–99)

## 2022-12-30 LAB — LEGIONELLA PNEUMOPHILA SEROGP 1 UR AG: L. pneumophila Serogp 1 Ur Ag: NEGATIVE

## 2022-12-30 MED ORDER — MIDODRINE HCL 5 MG PO TABS
5.0000 mg | ORAL_TABLET | Freq: Three times a day (TID) | ORAL | Status: DC
  Administered 2022-12-30: 5 mg
  Filled 2022-12-30: qty 1

## 2022-12-30 MED ORDER — POTASSIUM CHLORIDE 20 MEQ PO PACK
40.0000 meq | PACK | ORAL | Status: AC
  Administered 2022-12-30 (×2): 40 meq
  Filled 2022-12-30 (×2): qty 2

## 2022-12-30 MED ORDER — SODIUM CHLORIDE 3 % IN NEBU: 4.0000 mL | INHALATION_SOLUTION | Freq: Two times a day (BID) | RESPIRATORY_TRACT | 12 refills | Status: DC

## 2022-12-30 MED ORDER — POTASSIUM CHLORIDE 20 MEQ PO PACK: 40.0000 meq | PACK | Freq: Once | ORAL | Status: DC

## 2022-12-30 MED ORDER — FREE WATER: 300.0000 mL | Status: DC

## 2022-12-30 MED ORDER — FREE WATER
300.0000 mL | Status: DC
  Administered 2022-12-30 (×3): 300 mL

## 2022-12-30 MED ORDER — MEDIHONEY WOUND/BURN DRESSING EX PSTE: 1.0000 | PASTE | Freq: Every day | CUTANEOUS | Status: DC

## 2022-12-30 MED ORDER — POTASSIUM PHOSPHATES 15 MMOLE/5ML IV SOLN
30.0000 mmol | Freq: Once | INTRAVENOUS | Status: AC
  Administered 2022-12-30: 30 mmol via INTRAVENOUS
  Filled 2022-12-30: qty 10

## 2022-12-30 MED ORDER — MAGNESIUM SULFATE 4 GM/100ML IV SOLN
4.0000 g | Freq: Once | INTRAVENOUS | Status: AC
  Administered 2022-12-30: 4 g via INTRAVENOUS
  Filled 2022-12-30: qty 100

## 2022-12-30 MED ORDER — MIDODRINE HCL 5 MG PO TABS: 5.0000 mg | ORAL_TABLET | Freq: Three times a day (TID) | ORAL | Status: DC

## 2022-12-30 MED ORDER — INSULIN GLARGINE-YFGN 100 UNIT/ML ~~LOC~~ SOLN
14.0000 [IU] | Freq: Two times a day (BID) | SUBCUTANEOUS | Status: DC
  Administered 2022-12-30: 14 [IU] via SUBCUTANEOUS
  Filled 2022-12-30 (×2): qty 0.14

## 2022-12-30 MED ORDER — VANCOMYCIN HCL IN DEXTROSE 1-5 GM/200ML-% IV SOLN: 1000.0000 mg | Freq: Two times a day (BID) | INTRAVENOUS | Status: DC

## 2022-12-30 MED ORDER — SODIUM CHLORIDE 0.9 % IV SOLN: 1.0000 g | Freq: Three times a day (TID) | INTRAVENOUS | Status: DC

## 2022-12-30 MED ORDER — ADULT MULTIVITAMIN LIQUID CH: 15.0000 mL | Freq: Every day | ORAL | Status: DC
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
## 2022-12-30 NOTE — Progress Notes (Signed)
Spoke with wife about discharge back to [facility]

## 2022-12-30 NOTE — TOC Transition Note (Addendum)
Transition of Care [facility]) - CM/SW Discharge Note   Patient Details  Name: Joshua Haley MRN: [id number] Date of Birth: 07-16-1951  [facility] CM/SW Contact:  Joanne Chars, LCSW Phone Number: 12/30/2022, 2:20 PM   Clinical Narrative:   Pt discharging back to [facility] room 303B.  Accepting MD is VanEyk.  RN call report to [phone].  (Main number: [phone])  CSW made several attempts to contact wife, no answer, no other contacts listed.  Message left.  Angie/[facility] also informed.    1440: TC message left from wife that she had received message from [place], aware of transfer back to [facility].   Final next level of care: Skilled Nursing Facility Barriers to Discharge: No Barriers Identified   Patient Goals and CMS Choice      Discharge Placement                  Patient to be transferred to facility by: [employer] Name of family member notified: unable to reach wife despite several calls, message left    Discharge Plan and Services Additional resources added to the After Visit Summary for                                       Social Determinants of Health (SDOH) Interventions SDOH Screenings   Depression (PHQ2-9): Low Risk  (02/22/2020)  Tobacco Use: Medium Risk (12/12/2022)     Readmission Risk Interventions     No data to display
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
## 2022-12-30 NOTE — Plan of Care (Signed)

## 2022-12-30 NOTE — Progress Notes (Signed)
Report given to Public librarian at [facility].

## 2023-01-02 LAB — CULTURE, RESPIRATORY W GRAM STAIN

## 2023-01-03 LAB — CULTURE, BLOOD (ROUTINE X 2)
Culture: NO GROWTH
Culture: NO GROWTH
Special Requests: ADEQUATE
Special Requests: ADEQUATE

## 2023-01-12 DIAGNOSIS — R1310 Dysphagia, unspecified: Secondary | ICD-10-CM | POA: Diagnosis not present

## 2023-01-12 DIAGNOSIS — G40909 Epilepsy, unspecified, not intractable, without status epilepticus: Secondary | ICD-10-CM | POA: Diagnosis not present

## 2023-01-12 DIAGNOSIS — L89323 Pressure ulcer of left buttock, stage 3: Secondary | ICD-10-CM | POA: Diagnosis not present

## 2023-01-12 DIAGNOSIS — J9621 Acute and chronic respiratory failure with hypoxia: Secondary | ICD-10-CM | POA: Diagnosis not present

## 2023-01-15 DIAGNOSIS — J189 Pneumonia, unspecified organism: Secondary | ICD-10-CM | POA: Diagnosis not present

## 2023-01-15 DIAGNOSIS — J9621 Acute and chronic respiratory failure with hypoxia: Secondary | ICD-10-CM | POA: Diagnosis not present

## 2023-01-17 ENCOUNTER — Inpatient Hospital Stay (HOSPITAL_COMMUNITY)
Admission: EM | Admit: 2023-01-17 | Discharge: 2023-01-27 | DRG: 871 | Disposition: A | Discharge status: Other Acute Inpatient Hospital | Payer: No Typology Code available for payment source | Attending: Pulmonary Disease | Admitting: Pulmonary Disease

## 2023-01-17 ENCOUNTER — Emergency Department (HOSPITAL_COMMUNITY): Payer: No Typology Code available for payment source

## 2023-01-17 DIAGNOSIS — G40909 Epilepsy, unspecified, not intractable, without status epilepticus: Secondary | ICD-10-CM | POA: Diagnosis present

## 2023-01-17 DIAGNOSIS — Y95 Nosocomial condition: Secondary | ICD-10-CM | POA: Diagnosis present

## 2023-01-17 DIAGNOSIS — Z930 Tracheostomy status: Secondary | ICD-10-CM

## 2023-01-17 DIAGNOSIS — E039 Hypothyroidism, unspecified: Secondary | ICD-10-CM | POA: Diagnosis present

## 2023-01-17 DIAGNOSIS — Z87820 Personal history of traumatic brain injury: Secondary | ICD-10-CM

## 2023-01-17 DIAGNOSIS — Z9911 Dependence on respirator [ventilator] status: Secondary | ICD-10-CM

## 2023-01-17 DIAGNOSIS — J9811 Atelectasis: Secondary | ICD-10-CM | POA: Diagnosis not present

## 2023-01-17 DIAGNOSIS — Z951 Presence of aortocoronary bypass graft: Secondary | ICD-10-CM

## 2023-01-17 DIAGNOSIS — J151 Pneumonia due to Pseudomonas: Secondary | ICD-10-CM | POA: Diagnosis not present

## 2023-01-17 DIAGNOSIS — N390 Urinary tract infection, site not specified: Secondary | ICD-10-CM | POA: Diagnosis not present

## 2023-01-17 DIAGNOSIS — Z6825 Body mass index (BMI) 25.0-25.9, adult: Secondary | ICD-10-CM

## 2023-01-17 DIAGNOSIS — J9601 Acute respiratory failure with hypoxia: Secondary | ICD-10-CM

## 2023-01-17 DIAGNOSIS — E860 Dehydration: Secondary | ICD-10-CM | POA: Diagnosis present

## 2023-01-17 DIAGNOSIS — G9341 Metabolic encephalopathy: Secondary | ICD-10-CM | POA: Diagnosis not present

## 2023-01-17 DIAGNOSIS — Z79890 Hormone replacement therapy (postmenopausal): Secondary | ICD-10-CM

## 2023-01-17 DIAGNOSIS — R571 Hypovolemic shock: Secondary | ICD-10-CM | POA: Diagnosis present

## 2023-01-17 DIAGNOSIS — Z515 Encounter for palliative care: Secondary | ICD-10-CM

## 2023-01-17 DIAGNOSIS — Z888 Allergy status to other drugs, medicaments and biological substances status: Secondary | ICD-10-CM

## 2023-01-17 DIAGNOSIS — L89159 Pressure ulcer of sacral region, unspecified stage: Secondary | ICD-10-CM | POA: Diagnosis present

## 2023-01-17 DIAGNOSIS — Z8616 Personal history of COVID-19: Secondary | ICD-10-CM | POA: Diagnosis not present

## 2023-01-17 DIAGNOSIS — E1165 Type 2 diabetes mellitus with hyperglycemia: Secondary | ICD-10-CM | POA: Diagnosis not present

## 2023-01-17 DIAGNOSIS — J9621 Acute and chronic respiratory failure with hypoxia: Secondary | ICD-10-CM | POA: Diagnosis not present

## 2023-01-17 DIAGNOSIS — A419 Sepsis, unspecified organism: Principal | ICD-10-CM | POA: Diagnosis present

## 2023-01-17 DIAGNOSIS — A4151 Sepsis due to Escherichia coli [E. coli]: Principal | ICD-10-CM | POA: Diagnosis present

## 2023-01-17 DIAGNOSIS — Z66 Do not resuscitate: Secondary | ICD-10-CM | POA: Diagnosis not present

## 2023-01-17 DIAGNOSIS — I110 Hypertensive heart disease with heart failure: Secondary | ICD-10-CM | POA: Diagnosis present

## 2023-01-17 DIAGNOSIS — R7401 Elevation of levels of liver transaminase levels: Secondary | ICD-10-CM

## 2023-01-17 DIAGNOSIS — E861 Hypovolemia: Secondary | ICD-10-CM | POA: Diagnosis not present

## 2023-01-17 DIAGNOSIS — I5030 Unspecified diastolic (congestive) heart failure: Secondary | ICD-10-CM | POA: Diagnosis not present

## 2023-01-17 DIAGNOSIS — E871 Hypo-osmolality and hyponatremia: Secondary | ICD-10-CM | POA: Diagnosis present

## 2023-01-17 DIAGNOSIS — E870 Hyperosmolality and hypernatremia: Secondary | ICD-10-CM

## 2023-01-17 DIAGNOSIS — R6521 Severe sepsis with septic shock: Secondary | ICD-10-CM | POA: Diagnosis not present

## 2023-01-17 DIAGNOSIS — I9589 Other hypotension: Secondary | ICD-10-CM | POA: Diagnosis present

## 2023-01-17 DIAGNOSIS — Z794 Long term (current) use of insulin: Secondary | ICD-10-CM

## 2023-01-17 DIAGNOSIS — I2510 Atherosclerotic heart disease of native coronary artery without angina pectoris: Secondary | ICD-10-CM | POA: Diagnosis present

## 2023-01-17 DIAGNOSIS — E46 Unspecified protein-calorie malnutrition: Secondary | ICD-10-CM | POA: Diagnosis present

## 2023-01-17 DIAGNOSIS — E876 Hypokalemia: Secondary | ICD-10-CM | POA: Diagnosis present

## 2023-01-17 DIAGNOSIS — E872 Acidosis, unspecified: Secondary | ICD-10-CM | POA: Diagnosis present

## 2023-01-17 DIAGNOSIS — R339 Retention of urine, unspecified: Secondary | ICD-10-CM | POA: Diagnosis present

## 2023-01-17 DIAGNOSIS — R748 Abnormal levels of other serum enzymes: Secondary | ICD-10-CM

## 2023-01-17 DIAGNOSIS — Z880 Allergy status to penicillin: Secondary | ICD-10-CM

## 2023-01-17 DIAGNOSIS — Z1612 Extended spectrum beta lactamase (ESBL) resistance: Secondary | ICD-10-CM | POA: Diagnosis present

## 2023-01-17 DIAGNOSIS — B9689 Other specified bacterial agents as the cause of diseases classified elsewhere: Secondary | ICD-10-CM | POA: Diagnosis present

## 2023-01-17 DIAGNOSIS — R000 Tachycardia, unspecified: Secondary | ICD-10-CM | POA: Diagnosis not present

## 2023-01-17 DIAGNOSIS — Z79899 Other long term (current) drug therapy: Secondary | ICD-10-CM

## 2023-01-17 DIAGNOSIS — Z7902 Long term (current) use of antithrombotics/antiplatelets: Secondary | ICD-10-CM

## 2023-01-17 DIAGNOSIS — Z87891 Personal history of nicotine dependence: Secondary | ICD-10-CM

## 2023-01-17 DIAGNOSIS — Z885 Allergy status to narcotic agent status: Secondary | ICD-10-CM

## 2023-01-17 MED ORDER — LACTATED RINGERS IV BOLUS (SEPSIS)
1000.0000 mL | Freq: Once | INTRAVENOUS | Status: AC
  Administered 2023-01-18: 1000 mL via INTRAVENOUS

## 2023-01-17 MED ORDER — SODIUM CHLORIDE 0.9 % IV SOLN: 2.0000 g | Freq: Once | INTRAVENOUS | Status: DC

## 2023-01-17 MED ORDER — LACTATED RINGERS IV SOLN: INTRAVENOUS | Status: AC

## 2023-01-17 MED ORDER — SODIUM CHLORIDE 0.9 % IV SOLN
2.0000 g | Freq: Once | INTRAVENOUS | Status: AC
  Administered 2023-01-18: 2 g via INTRAVENOUS
  Filled 2023-01-17: qty 12.5

## 2023-01-17 MED ORDER — VANCOMYCIN HCL IN DEXTROSE 1-5 GM/200ML-% IV SOLN
1000.0000 mg | Freq: Once | INTRAVENOUS | Status: AC
  Administered 2023-01-18: 1000 mg via INTRAVENOUS
  Filled 2023-01-17: qty 200

## 2023-01-17 MED ORDER — ACETAMINOPHEN 160 MG/5ML PO SOLN
650.0000 mg | Freq: Once | ORAL | Status: AC
  Administered 2023-01-18: 650 mg via ORAL
  Filled 2023-01-17: qty 20.3

## 2023-01-17 MED ORDER — ONDANSETRON HCL 4 MG/2ML IJ SOLN
4.0000 mg | Freq: Once | INTRAMUSCULAR | Status: AC
  Administered 2023-01-18: 4 mg via INTRAVENOUS
  Filled 2023-01-17: qty 2

## 2023-01-17 NOTE — ED Provider Notes (Signed)
[place] EMERGENCY DEPARTMENT AT [facility] Provider Note   CSN: [id number] Arrival date & time: 01/17/23  2316     History  Chief Complaint  Patient presents with   Altered Mental Status    Joshua Haley is a 72 y.o. male.  The history is provided by the nursing home. The history is limited by the condition of the patient (Patient unresponsive and intubated).  Altered Mental Status He has history of hypertension, diabetes, coronary artery disease, traumatic brain injury with chronic respiratory failure and chronically respirator dependent who was transferred from [facility] because of altered mental status.  Apparently, he has been on vancomycin for pneumonia for the last 3 days.  His heart rate has been very high today and he has been given multiple doses of metoprolol which have not reduced his heart rate.  Patient is completely unresponsive and unable to give any history.  Per transfer paperwork, he is DNR.   Home Medications Prior to Admission medications   Medication Sig Start Date End Date Taking? Authorizing Provider  amantadine (SYMMETREL) 50 MG/5ML solution Place 100 mg into feeding tube daily.    [provider]  Amino Acids-Protein Hydrolys (PRO-STAT AWC) LIQD Place 30 mLs into feeding tube daily.    [provider]  ascorbic acid (VITAMIN C) 500 MG tablet Place 500 mg into feeding tube daily. Give 500mg  per tube daily    [provider]  enoxaparin (LOVENOX) 40 MG/0.4ML injection Inject 40 mg into the skin daily.    [provider]  ferrous sulfate 300 (60 Fe) MG/5ML syrup Place 300 mg into feeding tube 2 (two) times daily.    [provider]  insulin aspart (NOVOLOG) 100 UNIT/ML injection insulin aspart (novoLOG) injection 0-15 Units 0-15 Units, Subcutaneous, Every 4 hours,  CBG < 70: Implement Hypoglycemia Standing Orders and refer to Hypoglycemia Standing Orders sidebar report CBG 70 - 120: 0 units CBG 121 -  150: 2 units CBG 151 - 200: 3 units CBG 201 - 250: 5 units CBG 251 - 300: 8 units CBG 301 - 350: 11 units CBG 351 - 400: 15 units CBG > 400: call MD Patient taking differently: Inject 0-21 Units into the skin in the morning, at noon, in the evening, and at bedtime. Sliding scale insulin: Blood sugar is <60 or >500, notify MD. Blood sugar is 0-150= 0 units Blood sugar is 151-200= 3 units Blood sugar is 201-250= 6 units Blood sugar is 251-300= 9 units Blood sugar is 301-350= 12 units Blood sugar is 351-400= 15 units Blood sugar is 401-450= 18 units Blood sugar is 451-500= 21 units 09/25/22   Ghimire, 09/27/22, MD  insulin glargine (LANTUS) 100 UNIT/ML injection Inject 0.25 mLs (25 Units total) into the skin daily. 09/25/22   Ghimire, 09/27/22, MD  leptospermum manuka honey (MEDIHONEY) PSTE paste Apply 1 Application topically daily. 12/31/22   03/01/23, MD  levETIRAcetam (KEPPRA) 100 MG/ML solution Place 250 mg into feeding tube every 12 (twelve) hours. Patient not taking: Reported on 12/28/2022    [provider]  levothyroxine (SYNTHROID) 100 MCG tablet Place 100 mcg into feeding tube daily. Give 02/26/2023 per tube daily    [provider]  liothyronine (CYTOMEL) 5 MCG tablet Place 7.5 mcg into feeding tube every 12 (twelve) hours. Give one and one half tablet (7.5mg ) per tube every 12 hours    [provider]  meropenem 1 g in sodium chloride 0.9 % 100 mL Inject  1 g into the vein every 8 (eight) hours. 12/30/22   Quincy Simmonds, MD  Methylcellulose, Laxative, (CITRUCEL) 500 MG TABS Place 500 mg into feeding tube 3 (three) times daily.    [provider]  midodrine (PROAMATINE) 5 MG tablet Place 1 tablet (5 mg total) into feeding tube 3 (three) times daily with meals. 12/30/22   Quincy Simmonds, MD  modafinil (PROVIGIL) 100 MG tablet Place 100 mg into feeding tube daily.    [provider]  Multiple Vitamin (MULTIVITAMIN) LIQD Place 15 mLs into feeding tube  daily. 12/31/22   Quincy Simmonds, MD  nutrition supplement, JUVEN, [employer]) PACK Place 1 packet into feeding tube 2 (two) times daily between meals. Patient not taking: Reported on 12/28/2022 09/25/22   Maretta Bees, MD  Nutritional Supplements [employer]) PACK Place 1 packet into feeding tube 2 (two) times daily. Arginaid 4.5gram-156mg /9.2 gram oral powder packet    [provider]  Nutritional Supplements (FEEDING SUPPLEMENT, GLUCERNA 1.5 CAL,) LIQD Place 60 mL/hr into feeding tube See admin instructions. Give 35mL/hr per tube by shift    [provider]  omega-3 acid ethyl esters (LOVAZA) 1 g capsule Place 1 g into feeding tube daily. Give 1 g per tube daily    [provider]  polyethylene glycol (MIRALAX / GLYCOLAX) 17 g packet Give 17g per tube daily as needed for constipation    [provider]  sertraline (ZOLOFT) 25 MG tablet Place 25 mg into feeding tube daily. Give 25mg  per tube daily    [provider]  sodium chloride HYPERTONIC 3 % nebulizer solution Take 4 mLs by nebulization 2 (two) times daily. 12/30/22   02/28/23, MD  sodium chloride irrigation 0.9 % irrigation Irrigate with 60 mLs as directed every 8 (eight) hours. Irrigate indwelling foley with 11mL sterile saline every 8 hours.    [provider]  vancomycin (VANCOCIN) 1-5 GM/200ML-% SOLN Inject 200 mLs (1,000 mg total) into the vein every 12 (twelve) hours. 12/30/22   02/28/23, MD  Water For Irrigation, Sterile (FREE WATER) SOLN Place 300 mLs into feeding tube every 4 (four) hours. 12/30/22   02/28/23, MD      Allergies    Morphine and related, Penicillins, and Statins    Review of Systems   Review of Systems  Unable to perform ROS: Intubated    Physical Exam Updated Vital Signs BP 104/84 (BP Location: Left Arm)   Pulse (!) 145   Temp (!) 105.2 F (40.7 C) (Rectal)   Resp (!) 27   Ht 5\' 11"  (1.803 m)   Wt 83.5 kg   SpO2 99%   BMI 25.67 kg/m   Physical Exam Vitals and nursing note reviewed.   72 year old male, resting comfortably and in no acute distress. Vital signs are significant for elevated temperature, heart rate, respiratory rate. Oxygen saturation is 99%, which is normal. Head is normocephalic and atraumatic. PERRLA.corneal reflexes intact.  Oropharynx is clear, but mucous membranes are very dry. Neck is nontender and supple without adenopathy or JVD.  Tracheostomy is in place. Lungs are clear without rales, wheezes, or rhonchi. Chest moves symmetrically. Heart has regular rate and rhythm without murmur. Abdomen is soft, flat with feeding tube present in the epigastric area. Extremities have no cyanosis or edema. Skin is warm and dry.  Stage III sacral decubitus is present with some purulent drainage noted. Neurologic: Unresponsive to painful stimuli, no spontaneous movement.  Brainstem reflexes are still present.  ED  Results / Procedures / Treatments   Labs (all labs ordered are listed, but only abnormal results are displayed) Labs Reviewed  RESP PANEL BY RT-PCR (RSV, FLU A&B, COVID)  [id number]  CULTURE, BLOOD (ROUTINE X 2)  CULTURE, BLOOD (ROUTINE X 2)  URINE CULTURE  LACTIC ACID, PLASMA  LACTIC ACID, PLASMA  COMPREHENSIVE METABOLIC PANEL  CBC WITH DIFFERENTIAL/PLATELET  PROTIME-INR  APTT  URINALYSIS, ROUTINE W REFLEX MICROSCOPIC  I-STAT ARTERIAL BLOOD GAS, ED    EKG EKG Interpretation  Date/Time:  Sunday January 17 2023 23:23:34 EST Ventricular Rate:  143 PR Interval:  115 QRS Duration: 83 QT Interval:  305 QTC Calculation: 471 R Axis:   73 Text Interpretation: Sinus tachycardia Abnormal R-wave progression, late transition Probable left ventricular hypertrophy When compared with ECG of 12/28/2022, HEART RATE has increased Confirmed by Delora Fuel ([id number]) on 01/17/2023 11:25:55 PM  Radiology DG Chest Port 1 View  Result Date: 01/18/2023 CLINICAL DATA:  Altered mental status. EXAM: PORTABLE CHEST 1 VIEW  COMPARISON:  December 28, 2022 FINDINGS: There is stable tracheostomy tube and right-sided PICC line positioning. Multiple sternal wires are seen. The heart size and mediastinal contours are within normal limits. Mild atelectasis and/or infiltrate is noted within the left lung base. There is no evidence of a pleural effusion or pneumothorax. Multilevel degenerative changes seen throughout the thoracic spine. IMPRESSION: Mild left basilar atelectasis and/or infiltrate. Electronically Signed   By: Virgina Norfolk M.D.   On: 01/18/2023 00:09    Procedures Procedures  Cardiac monitor shows sinus tachycardia, per my interpretation.  Medications Ordered in ED Medications  lactated ringers infusion (has no administration in time range)  lactated ringers bolus 1,000 mL (has no administration in time range)    And  lactated ringers bolus 1,000 mL (has no administration in time range)    And  lactated ringers bolus 1,000 mL (has no administration in time range)  vancomycin (VANCOCIN) IVPB 1000 mg/200 mL premix (has no administration in time range)  aztreonam (AZACTAM) 2 g in sodium chloride 0.9 % 100 mL IVPB (has no administration in time range)  acetaminophen (TYLENOL) 160 MG/5ML solution 650 mg (has no administration in time range)    ED Course/ Medical Decision Making/ A&P                             Medical Decision Making Amount and/or Complexity of Data Reviewed Labs: ordered. Radiology: ordered.  Risk OTC drugs. Prescription drug management.   Sepsis with fever and tachycardia.  He is reported to have had pneumonia at his facility and also has a sacral decubitus as well as an indwelling Foley catheter  -all of which can be sources for infection.  It is concerning that he has gotten this ill well on which should be appropriate antibiotics.  I have ordered initiation of the code sepsis protocol and have ordered early, goal-directed fluids.  I suspect that control of fever and dehydration  will help his heart rate.  I have ordered acetaminophen.  He has vomited twice in the ED and I have ordered a dose of ondansetron.  I have ordered antibiotics for hospital-acquired pneumonia, which would also be appropriate antibiotics for purulent skin infection.  I have reviewed his past records, and he was admitted on 12/28/2022 for sepsis, also admitted on 09/10/2022 for septic shock.  X-ray shows faint left basilar infiltrate.  I have independently viewed the image, and agree with the radiologist's interpretation.  Additionally, I note PICC line is in appropriate position in the distal superior vena cava.  Of note, with patient clinically dehydrated, there could easily be a much more significant infiltrate which will appear once he is adequately hydrated.  I have reviewed and interpreted his laboratory tests, and my interpretation is elevated lactic acid consistent with sepsis, marked hypernatremia with markedly elevated BUN consistent with severe dehydration, elevated transaminases and alkaline phosphatase of uncertain significance-all are new compared with 12/28/2022, mild leukocytosis, significant increase in hemoglobin compared with 12/29/2022 indicating significant dehydration and hemoconcentration, borderline elevated INR which is not felt to be clinically significant, essentially normal arterial blood gases for patient on a ventilator.  I have discussed the case with Dr. Duwayne Heck of pulmonary critical care service, who agrees to follow the patient for ventilator management.  I have discussed case with Dr. Howie Ill of internal medicine teaching service who agrees to admit the patient.  Of note, repeat lactic acid has not changed significantly and patient's blood pressure has drifted down into the 80s.  I have ordered additional IV fluids.  CRITICAL CARE Performed by: Delora Fuel Total critical care time: 140 minutes Critical care time was exclusive of separately billable procedures and treating other  patients. Critical care was necessary to treat or prevent imminent or life-threatening deterioration. Critical care was time spent personally by me on the following activities: development of treatment plan with patient and/or surrogate as well as nursing, discussions with consultants, evaluation of patient's response to treatment, examination of patient, obtaining history from patient or surrogate, ordering and performing treatments and interventions, ordering and review of laboratory studies, ordering and review of radiographic studies, pulse oximetry and re-evaluation of patient's condition.  Final Clinical Impression(s) / ED Diagnoses Final diagnoses:  Sepsis due to undetermined organism ([place])  Hospital-acquired pneumonia  Dehydration  Hypernatremia  Elevated transaminase level  Elevated serum alkaline phosphatase level    Rx / DC Orders ED Discharge Orders     None         Delora Fuel, MD 62/13/08 [phone]

## 2023-01-17 NOTE — ED Triage Notes (Signed)
Pt BIB [facility] from [facility] for AMS, pt normally alert but nonverbal. Pt currently unresponsive, does not repsond to pain, cold and diaphoretic. Recently diagnosed with pneumonia. Facility administered "multiple doses" of metoprolol for tachycardia throughout the day. Vent dependant at baseline.  [facility] VS:  96/66 RR 30 HR 147

## 2023-01-18 DIAGNOSIS — R6521 Severe sepsis with septic shock: Secondary | ICD-10-CM | POA: Diagnosis present

## 2023-01-18 DIAGNOSIS — I5030 Unspecified diastolic (congestive) heart failure: Secondary | ICD-10-CM | POA: Diagnosis not present

## 2023-01-18 DIAGNOSIS — Z8616 Personal history of COVID-19: Secondary | ICD-10-CM | POA: Diagnosis not present

## 2023-01-18 DIAGNOSIS — J189 Pneumonia, unspecified organism: Secondary | ICD-10-CM | POA: Diagnosis not present

## 2023-01-18 DIAGNOSIS — Z515 Encounter for palliative care: Secondary | ICD-10-CM | POA: Diagnosis not present

## 2023-01-18 DIAGNOSIS — Z66 Do not resuscitate: Secondary | ICD-10-CM | POA: Diagnosis present

## 2023-01-18 DIAGNOSIS — J9601 Acute respiratory failure with hypoxia: Secondary | ICD-10-CM | POA: Diagnosis not present

## 2023-01-18 DIAGNOSIS — Z9911 Dependence on respirator [ventilator] status: Secondary | ICD-10-CM | POA: Diagnosis not present

## 2023-01-18 DIAGNOSIS — Y95 Nosocomial condition: Secondary | ICD-10-CM | POA: Diagnosis present

## 2023-01-18 DIAGNOSIS — G40909 Epilepsy, unspecified, not intractable, without status epilepticus: Secondary | ICD-10-CM | POA: Diagnosis present

## 2023-01-18 DIAGNOSIS — A419 Sepsis, unspecified organism: Secondary | ICD-10-CM

## 2023-01-18 DIAGNOSIS — G9341 Metabolic encephalopathy: Secondary | ICD-10-CM | POA: Diagnosis present

## 2023-01-18 DIAGNOSIS — R571 Hypovolemic shock: Secondary | ICD-10-CM | POA: Diagnosis present

## 2023-01-18 DIAGNOSIS — E870 Hyperosmolality and hypernatremia: Secondary | ICD-10-CM | POA: Diagnosis present

## 2023-01-18 DIAGNOSIS — E46 Unspecified protein-calorie malnutrition: Secondary | ICD-10-CM | POA: Diagnosis present

## 2023-01-18 DIAGNOSIS — J9621 Acute and chronic respiratory failure with hypoxia: Secondary | ICD-10-CM | POA: Diagnosis not present

## 2023-01-18 DIAGNOSIS — J151 Pneumonia due to Pseudomonas: Secondary | ICD-10-CM | POA: Diagnosis not present

## 2023-01-18 DIAGNOSIS — N390 Urinary tract infection, site not specified: Secondary | ICD-10-CM | POA: Diagnosis present

## 2023-01-18 DIAGNOSIS — E1165 Type 2 diabetes mellitus with hyperglycemia: Secondary | ICD-10-CM | POA: Diagnosis present

## 2023-01-18 DIAGNOSIS — R569 Unspecified convulsions: Secondary | ICD-10-CM | POA: Diagnosis not present

## 2023-01-18 DIAGNOSIS — E860 Dehydration: Secondary | ICD-10-CM | POA: Diagnosis present

## 2023-01-18 DIAGNOSIS — E039 Hypothyroidism, unspecified: Secondary | ICD-10-CM | POA: Diagnosis present

## 2023-01-18 DIAGNOSIS — I110 Hypertensive heart disease with heart failure: Secondary | ICD-10-CM | POA: Diagnosis present

## 2023-01-18 LAB — CBC
HCT: 41.5 % (ref 39.0–52.0)
Hemoglobin: 12.2 g/dL — ABNORMAL LOW (ref 13.0–17.0)
MCH: 29.4 pg (ref 26.0–34.0)
MCHC: 29.4 g/dL — ABNORMAL LOW (ref 30.0–36.0)
MCV: 100 fL (ref 80.0–100.0)
Platelets: 202 10*3/uL (ref 150–400)
RBC: 4.15 MIL/uL — ABNORMAL LOW (ref 4.22–5.81)
RDW: 17.2 % — ABNORMAL HIGH (ref 11.5–15.5)
WBC: 10.7 10*3/uL — ABNORMAL HIGH (ref 4.0–10.5)
nRBC: 0.3 % — ABNORMAL HIGH (ref 0.0–0.2)

## 2023-01-18 LAB — COMPREHENSIVE METABOLIC PANEL
ALT: 156 U/L — ABNORMAL HIGH (ref 0–44)
AST: 97 U/L — ABNORMAL HIGH (ref 15–41)
Albumin: 2.4 g/dL — ABNORMAL LOW (ref 3.5–5.0)
Alkaline Phosphatase: 131 U/L — ABNORMAL HIGH (ref 38–126)
Anion gap: 12 (ref 5–15)
BUN: 77 mg/dL — ABNORMAL HIGH (ref 8–23)
CO2: 31 mmol/L (ref 22–32)
Calcium: 10 mg/dL (ref 8.9–10.3)
Chloride: 119 mmol/L — ABNORMAL HIGH (ref 98–111)
Creatinine, Ser: 0.98 mg/dL (ref 0.61–1.24)
GFR, Estimated: >60 mL/min (ref >60)
Glucose, Bld: 381 mg/dL — ABNORMAL HIGH (ref 70–99)
Potassium: 3.5 mmol/L (ref 3.5–5.1)
Sodium: 162 mmol/L (ref 135–145)
Total Bilirubin: 0.5 mg/dL (ref 0.3–1.2)
Total Protein: 8.1 g/dL (ref 6.5–8.1)

## 2023-01-18 LAB — PROTIME-INR
INR: 1.3 — ABNORMAL HIGH (ref 0.8–1.2)
Prothrombin Time: 15.8 seconds — ABNORMAL HIGH (ref 11.4–15.2)

## 2023-01-18 LAB — BASIC METABOLIC PANEL
Anion gap: 7 (ref 5–15)
Anion gap: 5 (ref 5–15)
BUN: 66 mg/dL — ABNORMAL HIGH (ref 8–23)
BUN: 45 mg/dL — ABNORMAL HIGH (ref 8–23)
CO2: 32 mmol/L (ref 22–32)
CO2: 31 mmol/L (ref 22–32)
Calcium: 8.8 mg/dL — ABNORMAL LOW (ref 8.9–10.3)
Calcium: 8.7 mg/dL — ABNORMAL LOW (ref 8.9–10.3)
Chloride: 118 mmol/L — ABNORMAL HIGH (ref 98–111)
Chloride: 118 mmol/L — ABNORMAL HIGH (ref 98–111)
Creatinine, Ser: 0.82 mg/dL (ref 0.61–1.24)
Creatinine, Ser: 0.5 mg/dL — ABNORMAL LOW (ref 0.61–1.24)
GFR, Estimated: >60 mL/min (ref >60)
GFR, Estimated: >60 mL/min (ref >60)
Glucose, Bld: 363 mg/dL — ABNORMAL HIGH (ref 70–99)
Glucose, Bld: 213 mg/dL — ABNORMAL HIGH (ref 70–99)
Potassium: 3.1 mmol/L — ABNORMAL LOW (ref 3.5–5.1)
Potassium: 2.8 mmol/L — ABNORMAL LOW (ref 3.5–5.1)
Sodium: 157 mmol/L — ABNORMAL HIGH (ref 135–145)
Sodium: 154 mmol/L — ABNORMAL HIGH (ref 135–145)

## 2023-01-18 LAB — CBC WITH DIFFERENTIAL/PLATELET
Abs Immature Granulocytes: 0.06 10*3/uL (ref 0.00–0.07)
Basophils Absolute: 0.1 10*3/uL (ref 0.0–0.1)
Basophils Relative: 1 %
Eosinophils Absolute: 0 10*3/uL (ref 0.0–0.5)
Eosinophils Relative: 0 %
HCT: 54.9 % — ABNORMAL HIGH (ref 39.0–52.0)
Hemoglobin: 16 g/dL (ref 13.0–17.0)
Immature Granulocytes: 1 %
Lymphocytes Relative: 15 %
Lymphs Abs: 1.9 10*3/uL (ref 0.7–4.0)
MCH: 28.9 pg (ref 26.0–34.0)
MCHC: 29.1 g/dL — ABNORMAL LOW (ref 30.0–36.0)
MCV: 99.1 fL (ref 80.0–100.0)
Monocytes Absolute: 1.1 10*3/uL — ABNORMAL HIGH (ref 0.1–1.0)
Monocytes Relative: 9 %
Neutro Abs: 9.5 10*3/uL — ABNORMAL HIGH (ref 1.7–7.7)
Neutrophils Relative %: 74 %
Platelets: 344 10*3/uL (ref 150–400)
RBC: 5.54 MIL/uL (ref 4.22–5.81)
RDW: 17.4 % — ABNORMAL HIGH (ref 11.5–15.5)
WBC: 12.7 10*3/uL — ABNORMAL HIGH (ref 4.0–10.5)
nRBC: 0.2 % (ref 0.0–0.2)

## 2023-01-18 LAB — PHOSPHORUS
Phosphorus: 1.8 mg/dL — ABNORMAL LOW (ref 2.5–4.6)
Phosphorus: 1.8 mg/dL — ABNORMAL LOW (ref 2.5–4.6)

## 2023-01-18 LAB — URINALYSIS, ROUTINE W REFLEX MICROSCOPIC
Bilirubin Urine: NEGATIVE
Glucose, UA: NEGATIVE mg/dL
Ketones, ur: NEGATIVE mg/dL
Nitrite: NEGATIVE
Protein, ur: 30 mg/dL — AB
Specific Gravity, Urine: 1.02 (ref 1.005–1.030)
pH: 5.5 (ref 5.0–8.0)

## 2023-01-18 LAB — LACTIC ACID, PLASMA
Lactic Acid, Venous: 1.7 mmol/L (ref 0.5–1.9)
Lactic Acid, Venous: 1.9 mmol/L (ref 0.5–1.9)
Lactic Acid, Venous: 3.1 mmol/L (ref 0.5–1.9)
Lactic Acid, Venous: 3.2 mmol/L (ref 0.5–1.9)

## 2023-01-18 LAB — CBG MONITORING, ED
Glucose-Capillary: 189 mg/dL — ABNORMAL HIGH (ref 70–99)
Glucose-Capillary: 225 mg/dL — ABNORMAL HIGH (ref 70–99)
Glucose-Capillary: 232 mg/dL — ABNORMAL HIGH (ref 70–99)
Glucose-Capillary: 279 mg/dL — ABNORMAL HIGH (ref 70–99)
Glucose-Capillary: 357 mg/dL — ABNORMAL HIGH (ref 70–99)

## 2023-01-18 LAB — I-STAT ARTERIAL BLOOD GAS, ED
Acid-Base Excess: 8 mmol/L — ABNORMAL HIGH (ref 0.0–2.0)
Bicarbonate: 32.1 mmol/L — ABNORMAL HIGH (ref 20.0–28.0)
Calcium, Ion: 1.33 mmol/L (ref 1.15–1.40)
HCT: 47 % (ref 39.0–52.0)
Hemoglobin: 16 g/dL (ref 13.0–17.0)
O2 Saturation: 98 %
Patient temperature: 105.2
Potassium: 3.6 mmol/L (ref 3.5–5.1)
Sodium: 167 mmol/L (ref 135–145)
TCO2: 33 mmol/L — ABNORMAL HIGH (ref 22–32)
pCO2 arterial: 46 mmHg (ref 32–48)
pH, Arterial: 7.464 — ABNORMAL HIGH (ref 7.35–7.45)
pO2, Arterial: 121 mmHg — ABNORMAL HIGH (ref 83–108)

## 2023-01-18 LAB — URINALYSIS, MICROSCOPIC (REFLEX)

## 2023-01-18 LAB — RESP PANEL BY RT-PCR (RSV, FLU A&B, COVID)  RVPGX2
Influenza A by PCR: NEGATIVE
Influenza B by PCR: NEGATIVE
Resp Syncytial Virus by PCR: NEGATIVE
SARS Coronavirus 2 by RT PCR: NEGATIVE

## 2023-01-18 LAB — MAGNESIUM
Magnesium: 1.9 mg/dL (ref 1.7–2.4)
Magnesium: 1.8 mg/dL (ref 1.7–2.4)

## 2023-01-18 LAB — STREP PNEUMONIAE URINARY ANTIGEN: Strep Pneumo Urinary Antigen: NEGATIVE

## 2023-01-18 LAB — GLUCOSE, CAPILLARY
Glucose-Capillary: 196 mg/dL — ABNORMAL HIGH (ref 70–99)
Glucose-Capillary: 198 mg/dL — ABNORMAL HIGH (ref 70–99)

## 2023-01-18 LAB — APTT: aPTT: 35 seconds (ref 24–36)

## 2023-01-18 LAB — PROCALCITONIN: Procalcitonin: 0.66 ng/mL

## 2023-01-18 MED ORDER — POTASSIUM PHOSPHATES 15 MMOLE/5ML IV SOLN
15.0000 mmol | Freq: Once | INTRAVENOUS | Status: AC
  Administered 2023-01-18: 15 mmol via INTRAVENOUS
  Filled 2023-01-18: qty 5

## 2023-01-18 MED ORDER — FAMOTIDINE 20 MG PO TABS
20.0000 mg | ORAL_TABLET | Freq: Two times a day (BID) | ORAL | Status: DC
  Administered 2023-01-18 – 2023-01-27 (×20): 20 mg
  Filled 2023-01-18 (×20): qty 1

## 2023-01-18 MED ORDER — VANCOMYCIN HCL IN DEXTROSE 1-5 GM/200ML-% IV SOLN
1000.0000 mg | Freq: Two times a day (BID) | INTRAVENOUS | Status: DC
  Administered 2023-01-18 – 2023-01-19 (×2): 1000 mg via INTRAVENOUS
  Filled 2023-01-18 (×2): qty 200

## 2023-01-18 MED ORDER — DOCUSATE SODIUM 100 MG PO CAPS: 100.0000 mg | ORAL_CAPSULE | Freq: Two times a day (BID) | ORAL | Status: DC | PRN

## 2023-01-18 MED ORDER — FREE WATER
100.0000 mL | Freq: Four times a day (QID) | Status: DC
  Administered 2023-01-18 – 2023-01-19 (×4): 100 mL

## 2023-01-18 MED ORDER — INSULIN ASPART 100 UNIT/ML IJ SOLN
0.0000 [IU] | INTRAMUSCULAR | Status: DC
  Administered 2023-01-18: 3 [IU] via SUBCUTANEOUS
  Administered 2023-01-18: 8 [IU] via SUBCUTANEOUS
  Administered 2023-01-18 (×2): 5 [IU] via SUBCUTANEOUS
  Administered 2023-01-19 (×4): 3 [IU] via SUBCUTANEOUS
  Administered 2023-01-19 (×2): 5 [IU] via SUBCUTANEOUS
  Administered 2023-01-19: 3 [IU] via SUBCUTANEOUS
  Administered 2023-01-20 (×2): 5 [IU] via SUBCUTANEOUS
  Administered 2023-01-20: 8 [IU] via SUBCUTANEOUS
  Administered 2023-01-20: 3 [IU] via SUBCUTANEOUS
  Administered 2023-01-20: 8 [IU] via SUBCUTANEOUS
  Administered 2023-01-20 – 2023-01-22 (×7): 3 [IU] via SUBCUTANEOUS
  Administered 2023-01-22 (×2): 2 [IU] via SUBCUTANEOUS
  Administered 2023-01-22: 3 [IU] via SUBCUTANEOUS
  Administered 2023-01-22: 2 [IU] via SUBCUTANEOUS
  Administered 2023-01-22 – 2023-01-23 (×3): 3 [IU] via SUBCUTANEOUS
  Administered 2023-01-23 (×2): 2 [IU] via SUBCUTANEOUS
  Administered 2023-01-23: 3 [IU] via SUBCUTANEOUS
  Administered 2023-01-24 (×5): 2 [IU] via SUBCUTANEOUS
  Administered 2023-01-24 – 2023-01-25 (×3): 3 [IU] via SUBCUTANEOUS
  Administered 2023-01-25: 2 [IU] via SUBCUTANEOUS
  Administered 2023-01-25: 3 [IU] via SUBCUTANEOUS
  Administered 2023-01-25 (×2): 2 [IU] via SUBCUTANEOUS
  Administered 2023-01-26 (×2): 3 [IU] via SUBCUTANEOUS
  Administered 2023-01-26: 2 [IU] via SUBCUTANEOUS
  Administered 2023-01-26 (×3): 3 [IU] via SUBCUTANEOUS
  Administered 2023-01-27: 2 [IU] via SUBCUTANEOUS
  Administered 2023-01-27: 3 [IU] via SUBCUTANEOUS
  Administered 2023-01-27: 2 [IU] via SUBCUTANEOUS

## 2023-01-18 MED ORDER — SODIUM CHLORIDE 0.9 % IV SOLN: INTRAVENOUS | Status: DC

## 2023-01-18 MED ORDER — SERTRALINE HCL 50 MG PO TABS
25.0000 mg | ORAL_TABLET | Freq: Every day | ORAL | Status: DC
  Administered 2023-01-18 – 2023-01-27 (×10): 25 mg
  Filled 2023-01-18 (×10): qty 1

## 2023-01-18 MED ORDER — MODAFINIL 100 MG PO TABS
100.0000 mg | ORAL_TABLET | Freq: Every day | ORAL | Status: DC
  Administered 2023-01-18 – 2023-01-27 (×10): 100 mg
  Filled 2023-01-18 (×10): qty 1

## 2023-01-18 MED ORDER — NOREPINEPHRINE 4 MG/250ML-% IV SOLN
0.0000 ug/min | INTRAVENOUS | Status: DC
  Administered 2023-01-18 – 2023-01-20 (×2): 2 ug/min via INTRAVENOUS
  Filled 2023-01-18 (×3): qty 250

## 2023-01-18 MED ORDER — LACTATED RINGERS IV BOLUS
1000.0000 mL | Freq: Once | INTRAVENOUS | Status: AC
  Administered 2023-01-18: 1000 mL via INTRAVENOUS

## 2023-01-18 MED ORDER — POTASSIUM CHLORIDE 10 MEQ/100ML IV SOLN
10.0000 meq | INTRAVENOUS | Status: AC
  Administered 2023-01-18 (×4): 10 meq via INTRAVENOUS
  Filled 2023-01-18 (×4): qty 100

## 2023-01-18 MED ORDER — ENOXAPARIN SODIUM 40 MG/0.4ML IJ SOSY
40.0000 mg | PREFILLED_SYRINGE | INTRAMUSCULAR | Status: DC
  Administered 2023-01-18 – 2023-01-27 (×10): 40 mg via SUBCUTANEOUS
  Filled 2023-01-18 (×10): qty 0.4

## 2023-01-18 MED ORDER — POLYETHYLENE GLYCOL 3350 17 G PO PACK: 17.0000 g | PACK | Freq: Every day | ORAL | Status: DC | PRN

## 2023-01-18 MED ORDER — SODIUM CHLORIDE 0.9% FLUSH: 10.0000 mL | INTRAVENOUS | Status: DC | PRN

## 2023-01-18 MED ORDER — MAGNESIUM SULFATE 2 GM/50ML IV SOLN
2.0000 g | Freq: Once | INTRAVENOUS | Status: AC
  Administered 2023-01-18: 2 g via INTRAVENOUS
  Filled 2023-01-18: qty 50

## 2023-01-18 MED ORDER — LIOTHYRONINE SODIUM 5 MCG PO TABS
7.5000 ug | ORAL_TABLET | Freq: Two times a day (BID) | ORAL | Status: DC
  Administered 2023-01-19 – 2023-01-27 (×18): 7.5 ug
  Filled 2023-01-18 (×20): qty 2

## 2023-01-18 MED ORDER — SODIUM CHLORIDE 0.9 % IV SOLN
1.0000 g | Freq: Once | INTRAVENOUS | Status: AC
  Administered 2023-01-18: 1 g via INTRAVENOUS
  Filled 2023-01-18: qty 20

## 2023-01-18 MED ORDER — MIDODRINE HCL 5 MG PO TABS
5.0000 mg | ORAL_TABLET | Freq: Three times a day (TID) | ORAL | Status: DC
  Administered 2023-01-18 – 2023-01-20 (×7): 5 mg via NASOGASTRIC
  Filled 2023-01-18 (×7): qty 1

## 2023-01-18 MED ORDER — VANCOMYCIN HCL 750 MG/150ML IV SOLN
750.0000 mg | Freq: Once | INTRAVENOUS | Status: AC
  Administered 2023-01-18: 750 mg via INTRAVENOUS
  Filled 2023-01-18: qty 150

## 2023-01-18 MED ORDER — SODIUM CHLORIDE 0.9 % IV SOLN
1.0000 g | Freq: Three times a day (TID) | INTRAVENOUS | Status: DC
  Administered 2023-01-18 – 2023-01-20 (×6): 1 g via INTRAVENOUS
  Filled 2023-01-18 (×8): qty 20

## 2023-01-18 MED ORDER — VITAL HIGH PROTEIN PO LIQD
1000.0000 mL | ORAL | Status: DC
  Administered 2023-01-18: 1000 mL

## 2023-01-18 MED ORDER — LEVOTHYROXINE SODIUM 100 MCG PO TABS
100.0000 ug | ORAL_TABLET | Freq: Every day | ORAL | Status: DC
  Administered 2023-01-18 – 2023-01-27 (×10): 100 ug
  Filled 2023-01-18 (×11): qty 1

## 2023-01-18 MED ORDER — PROSOURCE TF20 ENFIT COMPATIBL EN LIQD
60.0000 mL | Freq: Every day | ENTERAL | Status: DC
  Administered 2023-01-18 – 2023-01-19 (×2): 60 mL
  Filled 2023-01-18 (×2): qty 60

## 2023-01-18 MED ORDER — CHLORHEXIDINE GLUCONATE CLOTH 2 % EX PADS
6.0000 | MEDICATED_PAD | Freq: Every day | CUTANEOUS | Status: DC
  Administered 2023-01-18 – 2023-01-27 (×10): 6 via TOPICAL

## 2023-01-18 MED ORDER — SODIUM CHLORIDE 0.9% FLUSH
10.0000 mL | Freq: Two times a day (BID) | INTRAVENOUS | Status: DC
  Administered 2023-01-18 – 2023-01-20 (×5): 10 mL

## 2023-01-18 NOTE — Progress Notes (Signed)
Pt transported from ED34 to 8E32 with no complications.

## 2023-01-18 NOTE — ED Notes (Addendum)
Patient on Levo, MD Dewald contacted at this time due to patient sustaining map above 60 and a systolic BP of greater than 90. MD instructed this paramedic to wean the levo down until completely off and maintaining.

## 2023-01-18 NOTE — ED Notes (Signed)
ED TO INPATIENT HANDOFF REPORT  ED Nurse Name and Phone #:    Armanda Heritagenna Manilla Strieter EMT-P  [phone]  S Name/Age/Gender Joshua LackJames R Haley 72 y.o. male Room/Bed: 034C/034C  Code Status   Code Status: DNR  Home/SNF/Other Skilled nursing facility  Is this baseline? Yes   Triage Complete: Triage complete  Chief Complaint Sepsis [facility](HCC) [A41.9]  Triage Note Pt B[facility] for [facility], pt normally alert but nonverbal. Pt currently unresponsive, does not repsond to pain, cold and diaphoretic. Recently diagnosed with pneumonia. Facility administered "multiple doses" of metoprolol for tachycardia throughout the day. Vent dependant at baseline.  [facility] VS:  96/66 RR 30 HR 147   Allergies Allergies  Allergen Reactions   Morphine And Related    Penicillins Other (See Comments)    Has tolerated Cefepime and Ceftriaxone   Statins Nausea And Vomiting    Level of Care/Admitting Diagnosis ED Disposition     ED Disposition  Admit   Condition  --   Comment  [facility] [[id number]]  Level of Care: ICU [6]  [facility] or [facility] if equivalent level of care is available:: No  Covid Evaluation: Confirmed COVID Negative  Diagnosis: Sepsis [facility](HCC) [[id number]][[id number]]  Admitting Physician: Charlotte SanesGONZALES, NICOLE [[id number]]  Attending Physician: Charlotte SanesGONZALES, NICOLE [[id number]]  Certification:: I certify this patient will need inpatient services for at least 2 midnights  Estimated Length of Stay: 4          B Medical/Surgery History 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    CAD (coronary artery disease)    Diabetes mellitus without complication (HCC)    Hypertension    Hypothyroidism    Kidney stone    TBI (traumatic brain injury) (HCC)    Tracheostomy in place [facility](HCC)    Past Surgical History:  Procedure Laterality Date   CHOLECYSTECTOMY     CRANIECTOMY     JEJUNOSTOMY FEEDING TUBE     LEFT HEART CATH  AND CORONARY ANGIOGRAPHY N/A 01/22/2020   Procedure: LEFT HEART CATH AND CORONARY ANGIOGRAPHY and possible PCI and stent;  Surgeon: Alwyn Peaallwood, Dwayne D, MD;  Location: [facility] INVASIVE CV LAB;  Service: Cardiovascular;  Laterality: N/A;   tracheostomy       A IV Location/Drains/Wounds Patient Lines/Drains/Airways Status     Active Line/Drains/Airways     Name Placement date Placement time Site Days   Peripheral IV 01/18/23 22 G Left;Posterior Hand 01/18/23  0012  Hand  less than 1   PICC Single Lumen Right --  --  --  --   Gastrostomy/Enterostomy PEG-jejunostomy LUQ 09/11/22  0300  LUQ  129   Urethral Catheter Malissa HippoJohn Santiago, RN Latex 16 Fr. 12/29/22  0445  Latex  20   Tracheostomy Shiley Flexible 6 mm Cuffed 01/18/23  1203  6 mm  less than 1   Pressure Injury 09/11/22 Sacrum Mid Stage 2 -  Partial thickness loss of dermis presenting as a shallow open injury with a red, pink wound bed without slough. masd with patchy areas of stage 2 pressure injury on sacrum - 15 cm X 12 cm 09/11/22  0345  -- 129   Pressure Injury 09/11/22 Perineum Left Stage 2 -  Partial thickness loss of dermis presenting as a shallow open injury with a red, pink wound bed without slough. fissure on left inner cheek 2 cm X 0.5 cm 09/11/22  0350  -- 129   Pressure Injury  12/28/22 Hip Right;Posterior Stage 2 -  Partial thickness loss of dermis presenting as a shallow open injury with a red, pink wound bed without slough. stage 2, RIGHT hip 12/28/22  2350  -- 21   Pressure Injury 12/28/22 Ischial tuberosity Left;Posterior;Proximal Unstageable - Full thickness tissue loss in which the base of the injury is covered by slough (yellow, tan, gray, green or brown) and/or eschar (tan, brown or black) in the wound bed. un 12/28/22  2350  -- 21   Pressure Injury 12/28/22 Sacrum Left Stage 3 -  Full thickness tissue loss. Subcutaneous fat may be visible but bone, tendon or muscle are NOT exposed. stage 3, sacrum 12/28/22  2350  -- 21    Pressure Injury 12/28/22 Back Left;Upper Stage 1 -  Intact skin with non-blanchable redness of a localized area usually over a bony prominence. stage 1, left upper back 12/28/22  2350  -- 21            Intake/Output Last 24 hours  Intake/Output Summary (Last 24 hours) at 01/18/2023 1449 Last data filed at 01/18/2023 1437 Gross per 24 hour  Intake 2044.72 ml  Output --  Net 2044.72 ml    Labs/Imaging Results for orders placed or performed during the hospital encounter of 01/17/23 (from the past 48 hour(s))  Lactic acid, plasma     Status: Abnormal   Collection Time: 01/17/23 11:35 PM  Result Value Ref Range   Lactic Acid, Venous 3.1 (HH) 0.5 - 1.9 mmol/L    Comment: CRITICAL RESULT CALLED TO, READ BACK BY AND VERIFIED WITH ALTUS ZAINO RN 01/18/23 0055 Wiliam Ke Performed at [facility], [place] [address] [place], [place] [address]   Comprehensive metabolic panel     Status: Abnormal   Collection Time: 01/17/23 11:35 PM  Result Value Ref Range   Sodium 162 (HH) 135 - 145 mmol/L    Comment: CRITICAL RESULT CALLED TO, READ BACK BY AND VERIFIED WITH ASHLEY Kenniel RN 01/18/23 0055 M KOROLESKI   Potassium 3.5 3.5 - 5.1 mmol/L   Chloride 119 (H) 98 - 111 mmol/L   CO2 31 22 - 32 mmol/L   Glucose, Bld 381 (H) 70 - 99 mg/dL    Comment: Glucose reference range applies only to samples taken after fasting for at least 8 hours.   BUN 77 (H) 8 - 23 mg/dL   Creatinine, Ser 0.98 0.61 - 1.24 mg/dL   Calcium 10.0 8.9 - 10.3 mg/dL   Total Protein 8.1 6.5 - 8.1 g/dL   Albumin 2.4 (L) 3.5 - 5.0 g/dL   AST 97 (H) 15 - 41 U/L   ALT 156 (H) 0 - 44 U/L   Alkaline Phosphatase 131 (H) 38 - 126 U/L   Total Bilirubin 0.5 0.3 - 1.2 mg/dL   GFR, Estimated >60 >60 mL/min    Comment: (NOTE) Calculated using the CKD-EPI Creatinine Equation (2021)    Anion gap 12 5 - 15    Comment: Performed at [facility], [place] [address], [place], [place] [address]  CBC with Differential     Status:  Abnormal   Collection Time: 01/17/23 11:35 PM  Result Value Ref Range   WBC 12.7 (H) 4.0 - 10.5 K/uL   RBC 5.54 4.22 - 5.81 MIL/uL   Hemoglobin 16.0 13.0 - 17.0 g/dL   HCT 54.9 (H) 39.0 - 52.0 %   MCV 99.1 80.0 - 100.0 fL   MCH 28.9 26.0 - 34.0 pg   MCHC  29.1 (L) 30.0 - 36.0 g/dL   RDW 16.117.4 (H) 09.611.5 - 04.515.5 %   Platelets 344 150 - 400 K/uL   nRBC 0.2 0.0 - 0.2 %   Neutrophils Relative % 74 %   Neutro Abs 9.5 (H) 1.7 - 7.7 K/uL   Lymphocytes Relative 15 %   Lymphs Abs 1.9 0.7 - 4.0 K/uL   Monocytes Relative 9 %   Monocytes Absolute 1.1 (H) 0.1 - 1.0 K/uL   Eosinophils Relative 0 %   Eosinophils Absolute 0.0 0.0 - 0.5 K/uL   Basophils Relative 1 %   Basophils Absolute 0.1 0.0 - 0.1 K/uL   Immature Granulocytes 1 %   Abs Immature Granulocytes 0.06 0.00 - 0.07 K/uL    Comment: Performed at [facility], [address] [place], [place] [address]  Protime-INR     Status: Abnormal   Collection Time: 01/17/23 11:35 PM  Result Value Ref Range   Prothrombin Time 15.8 (H) 11.4 - 15.2 seconds   INR 1.3 (H) 0.8 - 1.2    Comment: (NOTE) INR goal varies based on device and disease states. Performed at [facility], [address] [place], [place] [address]   APTT     Status: None   Collection Time: 01/17/23 11:35 PM  Result Value Ref Range   aPTT 35 24 - 36 seconds    Comment: Performed at [facility], [address] [place], [place] [address]  Resp panel by RT-PCR (RSV, Flu A&B, Covid) Anterior Nasal Swab     Status: None   Collection Time: 01/17/23 11:41 PM   Specimen: Anterior Nasal Swab  Result Value Ref Range   SARS Coronavirus 2 by RT PCR NEGATIVE NEGATIVE    Comment: (NOTE) SARS-CoV-2 target nucleic acids are NOT DETECTED.  The SARS-CoV-2 RNA is generally detectable in upper respiratory specimens during the acute phase of infection. The lowest concentration of SARS-CoV-2 viral copies this assay can detect is 138 copies/mL. A negative result does not  preclude SARS-Cov-2 infection and should not be used as the sole basis for treatment or other patient management decisions. A negative result may occur with  improper specimen collection/handling, submission of specimen other than nasopharyngeal swab, presence of viral mutation(s) within the areas targeted by this assay, and inadequate number of viral copies(<138 copies/mL). A negative result must be combined with clinical observations, patient history, and epidemiological information. The expected result is Negative.  Fact Sheet for Patients:  BloggerCourse.comhttps://www.fda.gov/media/152166/download  Fact Sheet for Healthcare Providers:  SeriousBroker.ithttps://www.fda.gov/media/152162/download  This test is no t yet approved or cleared by the [place] FDA and  has been authorized for detection and/or diagnosis of SARS-CoV-2 by FDA under an Emergency Use Authorization (EUA). This EUA will remain  in effect (meaning this test can be used) for the duration of the COVID-19 declaration under Section 564(b)(1) of the Act, 21 U.S.C.section 360bbb-3(b)(1), unless the authorization is terminated  or revoked sooner.       Influenza A by PCR NEGATIVE NEGATIVE   Influenza B by PCR NEGATIVE NEGATIVE    Comment: (NOTE) The Xpert Xpress SARS-CoV-2/FLU/RSV plus assay is intended as an aid in the diagnosis of influenza from Nasopharyngeal swab specimens and should not be used as a sole basis for treatment. Nasal washings and aspirates are unacceptable for Xpert Xpress SARS-CoV-2/FLU/RSV testing.  Fact Sheet for Patients: BloggerCourse.comhttps://www.fda.gov/media/152166/download  Fact Sheet for Healthcare Providers: SeriousBroker.ithttps://www.fda.gov/media/152162/download  This test is not yet approved or cleared by the [place] FDA and has been authorized for detection and/or diagnosis of SARS-CoV-2 by FDA under an Emergency Use Authorization (EUA). This EUA will remain in effect (meaning this test can be used) for the duration of  the COVID-19 declaration under Section 564(b)(1) of the Act, 21 U.S.C. section 360bbb-3(b)(1), unless the authorization is terminated or revoked.     Resp Syncytial Virus by PCR NEGATIVE NEGATIVE    Comment: (NOTE) Fact Sheet for Patients: EntrepreneurPulse.com.au  Fact Sheet for Healthcare Providers: IncredibleEmployment.be  This test is not yet approved or cleared by the [place] FDA and has been authorized for detection and/or diagnosis of SARS-CoV-2 by FDA under an Emergency Use Authorization (EUA). This EUA will remain in effect (meaning this test can be used) for the duration of the COVID-19 declaration under Section 564(b)(1) of the Act, 21 U.S.C. section 360bbb-3(b)(1), unless the authorization is terminated or revoked.  Performed at [facility], [place] [address], [place], [place] [address]   POC CBG, ED     Status: Abnormal   Collection Time: 01/17/23 11:57 PM  Result Value Ref Range   Glucose-Capillary 357 (H) 70 - 99 mg/dL    Comment: Glucose reference range applies only to samples taken after fasting for at least 8 hours.  I-Stat arterial blood gas, ED     Status: Abnormal   Collection Time: 01/18/23 12:27 AM  Result Value Ref Range   pH, Arterial 7.464 (H) 7.35 - 7.45   pCO2 arterial 46.0 32 - 48 mmHg   pO2, Arterial 121 (H) 83 - 108 mmHg   Bicarbonate 32.1 (H) 20.0 - 28.0 mmol/L   TCO2 33 (H) 22 - 32 mmol/L   O2 Saturation 98 %   Acid-Base Excess 8.0 (H) 0.0 - 2.0 mmol/L   Sodium 167 (HH) 135 - 145 mmol/L   Potassium 3.6 3.5 - 5.1 mmol/L   Calcium, Ion 1.33 1.15 - 1.40 mmol/L   HCT 47.0 39.0 - 52.0 %   Hemoglobin 16.0 13.0 - 17.0 g/dL   Patient temperature 105.2 F    Collection site RADIAL, ALLEN'S TEST ACCEPTABLE    Drawn by RT    Sample type ARTERIAL    Comment NOTIFIED PHYSICIAN   Lactic acid, plasma     Status: Abnormal   Collection Time: 01/18/23  2:36 AM  Result Value Ref Range   Lactic Acid, Venous  3.2 (HH) 0.5 - 1.9 mmol/L    Comment: CRITICAL VALUE NOTED. VALUE IS CONSISTENT WITH PREVIOUSLY REPORTED/CALLED VALUE Performed at [facility], [place] [address] [place], [place] [address]   Urinalysis, Routine w reflex microscopic Urine, Catheterized     Status: Abnormal   Collection Time: 01/18/23  2:41 AM  Result Value Ref Range   Color, Urine YELLOW YELLOW   APPearance HAZY (A) CLEAR   Specific Gravity, Urine 1.020 1.005 - 1.030   pH 5.5 5.0 - 8.0   Glucose, UA NEGATIVE NEGATIVE mg/dL   Hgb urine dipstick SMALL (A) NEGATIVE   Bilirubin Urine NEGATIVE NEGATIVE   Ketones, ur NEGATIVE NEGATIVE mg/dL   Protein, ur 30 (A) NEGATIVE mg/dL   Nitrite NEGATIVE NEGATIVE   Leukocytes,Ua SMALL (A) NEGATIVE    Comment: Performed at [place] [address], [place], [place] [address]  Urinalysis, Microscopic (reflex)     Status: Abnormal   Collection Time: 01/18/23  2:41 AM  Result Value Ref Range   RBC / HPF 0-5 0 - 5 RBC/hpf   WBC, UA 0-5  0 - 5 WBC/hpf   Bacteria, UA MANY (A) NONE SEEN   Squamous Epithelial / HPF 0-5 0 - 5 /HPF   Mucus PRESENT    Budding Yeast PRESENT    Hyaline Casts, UA PRESENT    Urine-Other LESS THAN 10 mL OF URINE SUBMITTED     Comment: MICROSCOPIC EXAM PERFORMED ON UNCONCENTRATED URINE Performed at [facility], [address], [place], [place] [address]   CBC     Status: Abnormal   Collection Time: 01/18/23  4:03 AM  Result Value Ref Range   WBC 10.7 (H) 4.0 - 10.5 K/uL   RBC 4.15 (L) 4.22 - 5.81 MIL/uL   Hemoglobin 12.2 (L) 13.0 - 17.0 g/dL    Comment: REPEATED TO VERIFY   HCT 41.5 39.0 - 52.0 %   MCV 100.0 80.0 - 100.0 fL   MCH 29.4 26.0 - 34.0 pg   MCHC 29.4 (L) 30.0 - 36.0 g/dL   RDW 06.3 (H) 01.6 - 01.0 %   Platelets 202 150 - 400 K/uL    Comment: REPEATED TO VERIFY   nRBC 0.3 (H) 0.0 - 0.2 %    Comment: Performed at [facility], [address], [place], [place] [address]  Basic metabolic panel     Status: Abnormal    Collection Time: 01/18/23  4:03 AM  Result Value Ref Range   Sodium 157 (H) 135 - 145 mmol/L   Potassium 3.1 (L) 3.5 - 5.1 mmol/L   Chloride 118 (H) 98 - 111 mmol/L   CO2 32 22 - 32 mmol/L   Glucose, Bld 363 (H) 70 - 99 mg/dL    Comment: Glucose reference range applies only to samples taken after fasting for at least 8 hours.   BUN 66 (H) 8 - 23 mg/dL   Creatinine, Ser 5.73 0.61 - 1.24 mg/dL   Calcium 8.8 (L) 8.9 - 10.3 mg/dL   GFR, Estimated >22 >02 mL/min    Comment: (NOTE) Calculated using the CKD-EPI Creatinine Equation (2021)    Anion gap 7 5 - 15    Comment: Performed at [facility], [address] [place], [place] [address]  CBG monitoring, ED     Status: Abnormal   Collection Time: 01/18/23  9:24 AM  Result Value Ref Range   Glucose-Capillary 279 (H) 70 - 99 mg/dL    Comment: Glucose reference range applies only to samples taken after fasting for at least 8 hours.  Magnesium     Status: None   Collection Time: 01/18/23 11:39 AM  Result Value Ref Range   Magnesium 1.9 1.7 - 2.4 mg/dL    Comment: Performed at [facility], [address], [place], [place] [address]  Phosphorus     Status: Abnormal   Collection Time: 01/18/23 11:39 AM  Result Value Ref Range   Phosphorus 1.8 (L) 2.5 - 4.6 mg/dL    Comment: Performed at [facility], [address] [place], [place] [address]  Procalcitonin - Baseline     Status: None   Collection Time: 01/18/23 11:39 AM  Result Value Ref Range   Procalcitonin 0.66 ng/mL    Comment:        Interpretation: PCT > 0.5 ng/mL and <= 2 ng/mL: Systemic infection (sepsis) is possible, but other conditions are known to elevate PCT as well. (NOTE)       Sepsis PCT Algorithm           Lower Respiratory  Tract                                      Infection PCT Algorithm    ----------------------------     ----------------------------         PCT < 0.25 ng/mL                PCT < 0.10 ng/mL          Strongly encourage              Strongly discourage   discontinuation of antibiotics    initiation of antibiotics    ----------------------------     -----------------------------       PCT 0.25 - 0.50 ng/mL            PCT 0.10 - 0.25 ng/mL               OR       >80% decrease in PCT            Discourage initiation of                                            antibiotics      Encourage discontinuation           of antibiotics    ----------------------------     -----------------------------         PCT >= 0.50 ng/mL              PCT 0.26 - 0.50 ng/mL                AND       <80% decrease in PCT             Encourage initiation of                                             antibiotics       Encourage continuation           of antibiotics    ----------------------------     -----------------------------        PCT >= 0.50 ng/mL                  PCT > 0.50 ng/mL               AND         increase in PCT                  Strongly encourage                                      initiation of antibiotics    Strongly encourage escalation           of antibiotics                                     -----------------------------  PCT <= 0.25 ng/mL                                                 OR                                        > 80% decrease in PCT                                      Discontinue / Do not initiate                                             antibiotics  Performed at [facility], [address], [place], [place] [address]   CBG monitoring, ED     Status: Abnormal   Collection Time: 01/18/23 12:07 PM  Result Value Ref Range   Glucose-Capillary 225 (H) 70 - 99 mg/dL    Comment: Glucose reference range applies only to samples taken after fasting for at least 8 hours.   DG Chest Port 1 View  Result Date: 01/18/2023 CLINICAL DATA:  Altered mental status. EXAM: PORTABLE CHEST 1 VIEW COMPARISON:  December 28, 2022 FINDINGS: There is stable  tracheostomy tube and right-sided PICC line positioning. Multiple sternal wires are seen. The heart size and mediastinal contours are within normal limits. Mild atelectasis and/or infiltrate is noted within the left lung base. There is no evidence of a pleural effusion or pneumothorax. Multilevel degenerative changes seen throughout the thoracic spine. IMPRESSION: Mild left basilar atelectasis and/or infiltrate. Electronically Signed   By: Aram Candela M.D.   On: 01/18/2023 00:09    Pending Labs Unresulted Labs (From admission, onward)     Start     Ordered   01/25/23 0500  Creatinine, serum  (enoxaparin (LOVENOX)    CrCl >/= 30 ml/min)  Weekly,   R     Comments: while on enoxaparin therapy    01/18/23 0326   01/19/23 0500  CBC  Tomorrow morning,   R        01/18/23 1005   01/19/23 0500  Procalcitonin  Daily,   R      01/18/23 1005   01/18/23 1700  Basic metabolic panel  5A & 5P,   R (with TIMED occurrences)      01/18/23 1005   01/18/23 1211  Lactic acid, plasma  STAT Now then every 3 hours,   R (with STAT occurrences)      01/18/23 1210   01/18/23 0900  Culture, Respiratory w Gram Stain  Once,   R        01/18/23 0859   01/18/23 0444  Strep pneumoniae urinary antigen  Once,   R        01/18/23 0443   01/18/23 0444  Legionella Pneumophila Serogp 1 Ur Ag  Once,   R        01/18/23 0443   01/18/23 0334  Osmolality, urine  Once,   R        01/18/23 0333   01/18/23 0334  Sodium,  urine, random  Once,   R        01/18/23 0333   01/17/23 2341  Blood Culture (routine x 2)  (Septic presentation on arrival (screening labs, nursing and treatment orders for obvious sepsis))  BLOOD CULTURE X 2,   STAT      01/17/23 2343   01/17/23 2341  Urine Culture  (Septic presentation on arrival (screening labs, nursing and treatment orders for obvious sepsis))  ONCE - URGENT,   URGENT       Question:  Indication  Answer:  Sepsis   01/17/23 2343            Vitals/Pain Today's Vitals   01/18/23  1425 01/18/23 1430 01/18/23 1435 01/18/23 1440  BP: 97/68 110/66 112/69 107/70  Pulse: 99 98 95 96  Resp: Temp:      TempSrc:      SpO2: 100% 100% 100% 100%  Weight:      Height:        Isolation Precautions No active isolations  Medications Medications  lactated ringers infusion ( Intravenous Infusion Verify 01/18/23 0902)  sodium chloride flush (NS) 0.9 % injection 10-40 mL (10 mLs Intracatheter Given 01/18/23 0928)  sodium chloride flush (NS) 0.9 % injection 10-40 mL (has no administration in time range)  Chlorhexidine Gluconate Cloth 2 % PADS 6 each (0 each Topical Hold 01/18/23 0929)  docusate sodium (COLACE) capsule 100 mg (has no administration in time range)  polyethylene glycol (MIRALAX / GLYCOLAX) packet 17 g (has no administration in time range)  enoxaparin (LOVENOX) injection 40 mg (40 mg Subcutaneous Given 01/18/23 1433)  famotidine (PEPCID) tablet 20 mg (20 mg Per Tube Given 01/18/23 1050)  norepinephrine (LEVOPHED)  in (0.016 mg/mL) premix infusion (3 mcg/min Intravenous Rate/Dose Change 01/18/23 1436)  midodrine (PROAMATINE) tablet 5 mg (5 mg Per NG tube Given 01/18/23 1430)  levothyroxine (SYNTHROID) tablet 100 mcg (100 mcg Per Tube Given 01/18/23 0820)  liothyronine (CYTOMEL) tablet 7.5 mcg (0 mcg Per Tube Hold 01/18/23 1056)  modafinil (PROVIGIL) tablet 100 mg (100 mg Per Tube Given 01/18/23 1050)  sertraline (ZOLOFT) tablet 25 mg (25 mg Per Tube Given 01/18/23 1050)  vancomycin (VANCOCIN) IVPB 1000 mg/200 mL premix (has no administration in time range)  meropenem (MERREM) 1 g in sodium chloride 0.9 % 100 mL IVPB (1 g Intravenous New Bag/Given 01/18/23 1433)  insulin aspart (novoLOG) injection 0-15 Units (5 Units Subcutaneous Given 01/18/23 1223)  free water 100 mL (100 mLs Per Tube Given 01/18/23 1436)  lactated ringers bolus 1,000 mL (0 mLs Intravenous Stopped 01/18/23 0146)    And  lactated ringers bolus 1,000 mL (0 mLs Intravenous Stopped 01/18/23  0122)    And  lactated ringers bolus 1,000 mL (0 mLs Intravenous Stopped 01/18/23 0156)  vancomycin (VANCOCIN) IVPB 1000 mg/200 mL premix (0 mg Intravenous Stopped 01/18/23 0146)  acetaminophen (TYLENOL) 160 MG/5ML solution 650 mg (650 mg Oral Given 01/18/23 0025)  ondansetron (ZOFRAN) injection 4 mg (4 mg Intravenous Given 01/18/23 0012)  ceFEPIme (MAXIPIME) 2 g in sodium chloride 0.9 % 100 mL IVPB (0 g Intravenous Stopped 01/18/23 0122)  lactated ringers bolus 1,000 mL (0 mLs Intravenous Stopped 01/18/23 0334)  potassium chloride 10 mEq in 100 mL IVPB (0 mEq Intravenous Stopped 01/18/23 1056)  meropenem (MERREM) 1 g in sodium chloride 0.9 % 100 mL IVPB (0 g Intravenous Stopped 01/18/23 0651)  vancomycin (VANCOREADY) IVPB 750 mg/150 mL (0 mg Intravenous Stopped 01/18/23 0752)  Mobility non-ambulatory        R Recommendations: See Admitting Provider Note  Report given to:   Additional Notes:   Patient from [facility]. Patient trached. Patient has a foley. Patient medications up to date. Patient NPO so no tube feeds at this time.

## 2023-01-18 NOTE — Progress Notes (Signed)
NAME:  Joshua Haley, MRN:  [id number], DOB:  19-Oct-1951, LOS: 0 ADMISSION DATE:  01/17/2023, CONSULTATION DATE:  01/18/23 REFERRING MD:  Roxanne Mins, CHIEF COMPLAINT:  altered    History of Present Illness:  72 yo man with a hx of HTN, DM, CAD, TBI, chronic resp failure (vent dependent), here with AMS.  Started on Vanc for PNA about 3 days ago.  Tachycardic today.  Baseline mental status: alert but non verbal.  Sacral decubitus ulcer.  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 Coli UTI and bilateral pneumonia on 12/28/2022.  Initially febrile and less responsive at [facility] and requiring increased O2 on trach collar.   Significant Hospital Events: Including procedures, antibiotic start and stop dates in addition to other pertinent events   1/22 admit to ICU septic shock PNA, looks very dehydrated as well   Interim History / Subjective:   Admitted. On NE  Objective   Blood pressure 101/68, pulse (!) 101, temperature (!) 101.6 F (38.7 C), temperature source Axillary, resp. rate 15, height 5\' 11"  (1.803 m), weight 83.5 kg, SpO2 100 %.    Vent Mode: PRVC FiO2 (%):  [50 %] 50 % Set Rate:  [15 bmp] 15 bmp Vt Set:  [500 mL] 500 mL PEEP:  [5 cmH20] 5 cmH20 Plateau Pressure:  [15 cmH20-17 cmH20] 15 cmH20   Intake/Output Summary (Last 24 hours) at 01/18/2023 0919 Last data filed at 01/18/2023 0902 Gross per 24 hour  Intake 1941.92 ml  Output --  Net 1941.92 ml   Filed Weights   01/17/23 2334  Weight: 83.5 kg    Examination: General: Chronically and acutely ill trach vent  HENT: NCAT. Thick yellow coating on tongue.  Thick tracheal secretions  Lungs: Coarse. Upper airway rhonchi  Cardiovascular: rr  Abdomen: + PEG  Extremities: Symmetrical muscle wasting  Neuro: Eyes are open. Does not follow commands.  GU: indwelling foley   Resolved Hospital Problem list     Assessment & Plan:   Septic shock, unclear etiology at this time (PNA, UTI, bacteremia with PICC  Possibly hypovolemic shock superimposed  Acute on chronic respiratory failure with hypoxia  Trach dependent  Hypernatremia Lactic acidosis Fever Hypokalemia  Hypothyroidism  DM with Hyperglycemia  Chronic malnutrition  Hx ESBL serratia pseudomonas ecoli Acute encephalopathy (sounds like at baseline he can squeeze hands per last admit note) P -follow Cx data -send trach asp as well  -cont IVF -add FWF -trend BMP, UOP  -vanc mero  -VAP, pulm hygiene  -synthroid, cytomel  -low threshold dc PICC    Best Practice (right click and "Reselect all SmartList Selections" daily)   Diet/type: tubefeeds DVT prophylaxis: systemic dose LMWH GI prophylaxis: PPI Lines: N/A picc Foley:  N/A Code Status:  DNR Last date of multidisciplinary goals of care discussion []   Labs   CBC: Recent Labs  Lab 01/17/23 2335 01/18/23 0027 01/18/23 0403  WBC 12.7*  --  10.7*  NEUTROABS 9.5*  --   --   HGB 16.0 16.0 12.2*  HCT 54.9* 47.0 41.5  MCV 99.1  --  100.0  PLT 344  --  202  Basic Metabolic Panel: Recent Labs  Lab 01/17/23 2335 01/18/23 0027 01/18/23 0403  NA 162* 167* 157*  K 3.5 3.6 3.1*  CL 119*  --  118*  CO2 31  --  32  GLUCOSE 381*  --  363*  BUN 77*  --  66*  CREATININE 0.98  --  0.82  CALCIUM 10.0  --  8.8*   GFR: Estimated Creatinine Clearance: 88 mL/min (by C-G formula based on SCr of 0.82 mg/dL). Recent Labs  Lab 01/17/23 2335 01/18/23 0236 01/18/23 0403  WBC 12.7*  --  10.7*  LATICACIDVEN 3.1* 3.2*  --     Liver Function Tests: Recent Labs  Lab 01/17/23 2335  AST 97*  ALT 156*  ALKPHOS 131*   BILITOT 0.5  PROT 8.1  ALBUMIN 2.4*   No results for input(s): "LIPASE", "AMYLASE" in the last 168 hours. No results for input(s): "AMMONIA" in the last 168 hours.  ABG    Component Value Date/Time   PHART 7.464 (H) 01/18/2023 0027   PCO2ART 46.0 01/18/2023 0027   PO2ART 121 (H) 01/18/2023 0027   HCO3 32.1 (H) 01/18/2023 0027   TCO2 33 (H) 01/18/2023 0027   O2SAT 98 01/18/2023 0027     Coagulation Profile: Recent Labs  Lab 01/17/23 2335  INR 1.3*    Cardiac Enzymes: No results for input(s): "CKTOTAL", "CKMB", "CKMBINDEX", "TROPONINI" in the last 168 hours.  HbA1C: Hgb A1c MFr Bld  Date/Time Value Ref Range Status  12/29/2022 12:07 AM 9.2 (H) 4.8 - 5.6 % Final    Comment:    (NOTE)         Prediabetes: 5.7 - 6.4         Diabetes: >6.4         Glycemic control for adults with diabetes: <7.0   06/29/2022 06:33 AM 7.5 (H) 4.8 - 5.6 % Final    Comment:    (NOTE) Pre diabetes:          5.7%-6.4%  Diabetes:              >6.4%  Glycemic control for   <7.0% adults with diabetes     CBG: Recent Labs  Lab 01/17/23 2357  GLUCAP 357*   CRITICAL CARE Performed by: Cristal Generous   Total critical care time: 35 minutes  Critical care time was exclusive of separately billable procedures and treating other patients. Critical care was necessary to treat or prevent imminent or life-threatening deterioration.  Critical care was time spent personally by me on the following activities: development of treatment plan with patient and/or surrogate as well as nursing, discussions with consultants, evaluation of patient's response to treatment, examination of patient, obtaining history from patient or surrogate, ordering and performing treatments and interventions, ordering and review of laboratory studies, ordering and review of radiographic studies, pulse oximetry and re-evaluation of patient's condition.  Eliseo Gum MSN, AGACNP-BC [place] for pager  01/18/2023, 9:19 AM

## 2023-01-18 NOTE — Consult Note (Deleted)
NAME:  Joshua Haley, MRN:  [id number], DOB:  13-Sep-1951, LOS: 0 ADMISSION DATE:  01/17/2023, CONSULTATION DATE:  01/18/23 REFERRING MD:  Preston Fleeting, CHIEF COMPLAINT:  altered    History of Present Illness:  72 yo man with a hx of HTN, DM, CAD, TBI, chronic resp failure (vent dependent), here with AMS.  Started on Vanc for PNA about 3 days ago.  Tachycardic today.  Baseline mental status: alert but non verbal.  Sacral decubitus ulcer.  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 Coli UTI and bilateral pneumonia on 12/28/2022.  Initially febrile and less responsive at [facility] and requiring increased O2 on trach collar.   Significant Hospital Events: Including procedures, antibiotic start and stop dates in addition to other pertinent events     Interim History / Subjective:    Objective   Blood pressure (!) 90/57, pulse (!) 113, temperature (!) 103.3 F (39.6 C), temperature source Rectal, resp. rate 15, height 5\' 11"  (1.803 m), weight 83.5 kg, SpO2 100 %.    Vent Mode: PRVC FiO2 (%):  [50 %] 50 % Set Rate:  [15 bmp] 15 bmp Vt Set:  [500 mL] 500 mL PEEP:  [5 cmH20] 5 cmH20 Plateau Pressure:  [17 cmH20] 17 cmH20   Intake/Output Summary (Last 24 hours) at 01/18/2023 01/20/2023 Last data filed at 01/18/2023 0122 Gross per 24 hour  Intake 1100 ml  Output --  Net 1100 ml   Filed Weights   01/17/23 2334  Weight: 83.5 kg    Examination: General: non responsive on vent  HENT: pupils about 3 mm minimal response   Lungs: CTAB  Cardiovascular: rrr  Abdomen: nt, nd, nbs  Extremities: wasting  Neuro: non resopnsive  GU:  indwelling foley   Resolved Hospital Problem list     Assessment & Plan:  Fever, ams, hypotension:  Sepsis.  Course not clear.  PNA vs UTI.   Mero and vanc  Fluid resuscitation Levophed as neeed for map 65 Blood cx pending.   Chronic vent: unclear to me if on vent at baseline or trach collar.  Cont vent for now in setting of sepsis.   Hypothryoidism: cont home meds.    Best Practice (right click and "Reselect all SmartList Selections" daily)   Diet/type: tubefeeds DVT prophylaxis: systemic dose LMWH GI prophylaxis: PPI Lines: N/A picc Foley:  N/A Code Status:  DNR Last date of multidisciplinary goals of care discussion []   Labs   CBC: Recent Labs  Lab 01/17/23 2335 01/18/23 0027  WBC 12.7*  --   NEUTROABS 9.5*  --   HGB 16.0 16.0  HCT 54.9* 47.0  MCV 99.1  --   PLT 344  --     Basic Metabolic Panel: Recent Labs  Lab 01/17/23 2335 01/18/23 0027  NA 162* 167*  K 3.5 3.6  CL 119*  --   CO2 31  --   GLUCOSE 381*  --   BUN 77*  --   CREATININE 0.98  --   CALCIUM 10.0  --    GFR: Estimated Creatinine Clearance: 73.6 mL/min (by C-G formula based on SCr of 0.98 mg/dL). Recent  Labs  Lab 01/17/23 2335  WBC 12.7*  LATICACIDVEN 3.1*    Liver Function Tests: Recent Labs  Lab 01/17/23 2335  AST 97*  ALT 156*  ALKPHOS 131*  BILITOT 0.5  PROT 8.1  ALBUMIN 2.4*   No results for input(s): "LIPASE", "AMYLASE" in the last 168 hours. No results for input(s): "AMMONIA" in the last 168 hours.  ABG    Component Value Date/Time   PHART 7.464 (H) 01/18/2023 0027   PCO2ART 46.0 01/18/2023 0027   PO2ART 121 (H) 01/18/2023 0027   HCO3 32.1 (H) 01/18/2023 0027   TCO2 33 (H) 01/18/2023 0027   O2SAT 98 01/18/2023 0027     Coagulation Profile: Recent Labs  Lab 01/17/23 2335  INR 1.3*    Cardiac Enzymes: No results for input(s): "CKTOTAL", "CKMB", "CKMBINDEX", "TROPONINI" in the last 168 hours.  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   Surgical History:   Past Surgical History:  Procedure Laterality Date   CHOLECYSTECTOMY     CRANIECTOMY     JEJUNOSTOMY FEEDING TUBE     LEFT HEART CATH AND CORONARY ANGIOGRAPHY N/A 01/22/2020   Procedure: LEFT HEART CATH AND CORONARY ANGIOGRAPHY and possible PCI and stent;  Surgeon: Alwyn Pea, MD;  Location: [facility] INVASIVE CV LAB;  Service: Cardiovascular;  Laterality: N/A;   tracheostomy       Social History:   reports that he has quit smoking. He has never used smokeless tobacco. He reports that he does not currently use alcohol.   Family History:  His family history is not on file.   Allergies Allergies  Allergen Reactions   Morphine And Related    Penicillins Other (See Comments)    Has tolerated Cefepime and Ceftriaxone   Statins Nausea And Vomiting     Home Medications  Prior to Admission medications   Medication Sig Start Date End Date Taking? Authorizing Provider  amantadine (SYMMETREL) 50 MG/5ML solution Place 100 mg into feeding tube daily.   Yes [provider]  Amino Acids-Protein Hydrolys (PRO-STAT AWC) LIQD Place 30 mLs into feeding tube daily.   Yes [provider]   ascorbic acid (VITAMIN C) 500 MG tablet Place 500 mg into feeding tube daily. Give 500mg  per tube daily   Yes [provider]  enoxaparin (LOVENOX) 40 MG/0.4ML injection Inject 40 mg into the skin daily.   Yes [provider]  ferrous sulfate  300 (60 Fe) MG/5ML syrup Place 300 mg into feeding tube 2 (two) times daily.   Yes [provider]  furosemide (LASIX) 20 MG tablet Place 20 mg into feeding tube 2 (two) times daily.   Yes [provider]  insulin aspart (NOVOLOG) 100 UNIT/ML injection insulin aspart (novoLOG) injection 0-15 Units 0-15 Units, Subcutaneous, Every 4 hours,  CBG < 70: Implement Hypoglycemia Standing Orders and refer to Hypoglycemia Standing Orders sidebar report CBG 70 - 120: 0 units CBG 121 - 150: 2 units CBG 151 - 200: 3 units CBG 201 - 250: 5 units CBG 251 - 300: 8 units CBG 301 - 350: 11 units CBG 351 - 400: 15 units CBG > 400: call MD Patient taking differently: Inject 0-21 Units into the skin in the morning, at noon, in the evening, and at bedtime. Sliding scale insulin: Blood sugar is <60 or >500, notify MD. Blood sugar is 0-150= 0 units Blood sugar is 151-200= 3 units Blood sugar is 201-250= 6 units Blood sugar is 251-300= 9 units Blood sugar is 301-350= 12 units Blood sugar is 351-400= 15 units Blood sugar is 401-450= 18 units Blood sugar is 451-500= 21 units 09/25/22  Yes Ghimire, Henreitta Leber, MD  insulin glargine (LANTUS) 100 UNIT/ML injection Inject 0.25 mLs (25 Units total) into the skin daily. Patient taking differently: Inject 30 Units into the skin daily. 09/25/22  Yes Ghimire, Henreitta Leber, MD  lansoprazole (PREVACID) 30 MG capsule Give 1 capsule (30mg ) per G tube daily   Yes [provider]  levothyroxine (SYNTHROID) 100 MCG tablet Place 100 mcg into feeding tube daily. Give 164mcg per tube daily   Yes [provider]  liothyronine (CYTOMEL) 5 MCG tablet Place 7.5 mcg into feeding tube every 12 (twelve) hours.  Give one and one half tablet (7.5mg ) per tube every 12 hours   Yes [provider]  meropenem 1 g in sodium chloride 0.9 % 100 mL Inject 1 g into the vein every 8 (eight) hours. 12/30/22  Yes Iona Beard, MD  Methylcellulose, Laxative, (CITRUCEL) 500 MG TABS Place 500 mg into feeding tube 3 (three) times daily.   Yes [provider]  metoprolol tartrate (LOPRESSOR) 25 MG tablet Place 12.5 mg into feeding tube 2 (two) times daily.   Yes [provider]  midodrine (PROAMATINE) 5 MG tablet Place 1 tablet (5 mg total) into feeding tube 3 (three) times daily with meals. 12/30/22  Yes Iona Beard, MD  modafinil (PROVIGIL) 100 MG tablet Place 100 mg into feeding tube daily.   Yes [provider]  Multiple Vitamin (MULTIVITAMIN) LIQD Place 15 mLs into feeding tube daily. Patient taking differently: Place into feeding tube daily. Give 1 tablet 12/31/22  Yes Iona Beard, MD  Nutritional Supplements [place]on Kingfisher) PACK Place 1 packet into feeding tube 2 (two) times daily. Arginaid 4.5gram-156mg /9.2 gram oral powder packet   Yes [provider]  Nutritional Supplements (FEEDING SUPPLEMENT, GLUCERNA 1.5 CAL,) LIQD Place 60 mL/hr into feeding tube See admin instructions. Give 40mL/hr per tube by shift   Yes [provider]  omega-3 acid ethyl esters (LOVAZA) 1 g capsule Place 1 g into feeding tube daily. Give 1 g per tube daily   Yes [provider]  ondansetron (ZOFRAN-ODT) 4 MG disintegrating tablet 4 mg every 6 (six) hours as needed for nausea or vomiting.   Yes [provider]  polyethylene glycol (MIRALAX / GLYCOLAX) 17 g packet Give 17g per tube daily as needed for constipation  Yes [provider]  sertraline (ZOLOFT) 25 MG tablet Place 25 mg into feeding tube daily. Give 25mg  per tube daily   Yes [provider]  sodium chloride irrigation 0.9 % irrigation Irrigate with 60 mLs as directed every 8 (eight) hours. Irrigate  indwelling foley with 52mL sterile saline every 8 hours.   Yes [provider]  leptospermum manuka honey (MEDIHONEY) PSTE paste Apply 1 Application topically daily. 12/31/22   Iona Beard, MD  levETIRAcetam (KEPPRA) 100 MG/ML solution Place 250 mg into feeding tube every 12 (twelve) hours. Patient not taking: Reported on 12/28/2022    [provider]  sodium chloride HYPERTONIC 3 % nebulizer solution Take 4 mLs by nebulization 2 (two) times daily. Patient not taking: Reported on 01/18/2023 12/30/22   Iona Beard, MD  vancomycin (VANCOCIN) 1-5 GM/200ML-% SOLN Inject 200 mLs (1,000 mg total) into the vein every 12 (twelve) hours. Patient not taking: Reported on 01/18/2023 12/30/22   Iona Beard, MD  Water For Irrigation, Sterile (FREE WATER) SOLN Place 300 mLs into feeding tube every 4 (four) hours. Patient not taking: Reported on 01/18/2023 12/30/22   Iona Beard, MD     Critical care time: 44min

## 2023-01-18 NOTE — ED Notes (Signed)
Phlebotomy contacted to draw lactic acid from patient.

## 2023-01-18 NOTE — H&P (Signed)
NAME:  Joshua Haley, MRN:  [id number], DOB:  Jan 31, 1951, LOS: 0 ADMISSION DATE:  01/17/2023, CONSULTATION DATE:  01/18/23 REFERRING MD:  Roxanne Mins, CHIEF COMPLAINT:  altered    History of Present Illness:  72 yo man with a hx of HTN, DM, CAD, TBI, chronic resp failure (vent dependent), here with AMS.  Started on Vanc for PNA about 3 days ago.  Tachycardic today.  Baseline mental status: alert but non verbal.  Sacral decubitus ulcer.  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 Coli UTI and bilateral pneumonia on 12/28/2022.  Initially febrile and less responsive at [facility] and requiring increased O2 on trach collar.   Significant Hospital Events: Including procedures, antibiotic start and stop dates in addition to other pertinent events     Interim History / Subjective:    Objective   Blood pressure 98/64, pulse (!) 104, temperature (!) 101.6 F (38.7 C), temperature source Axillary, resp. rate 15, height 5\' 11"  (1.803 m), weight 83.5 kg, SpO2 97 %.    Vent Mode: PRVC FiO2 (%):  [50 %] 50 % Set Rate:  [15 bmp] 15 bmp Vt Set:  [500 mL] 500 mL PEEP:  [5 cmH20] 5 cmH20 Plateau Pressure:  [15 cmH20-17 cmH20] 15 cmH20   Intake/Output Summary (Last 24 hours) at 01/18/2023 [id number] Last data filed at 01/18/2023 [id number] Gross per 24 hour  Intake 1193.15 ml  Output --  Net 1193.15 ml   Filed Weights   01/17/23 2334  Weight: 83.5 kg    Examination: General: non responsive on vent  HENT: pupils about 3 mm minimal response   Lungs: CTAB  Cardiovascular: rrr  Abdomen: nt, nd, nbs  Extremities: wasting  Neuro: non resopnsive   GU: indwelling foley   Resolved Hospital Problem list     Assessment & Plan:  Fever, ams, hypotension:  Sepsis.  Course not clear.  PNA vs UTI.   Mero and vanc  Fluid resuscitation Levophed as neeed for map 65 Blood cx pending.   Chronic vent: unclear to me if on vent at baseline or trach collar.  Cont vent for now in setting of sepsis.   Hypothryoidism: cont home meds.    Best Practice (right click and "Reselect all SmartList Selections" daily)   Diet/type: tubefeeds DVT prophylaxis: systemic dose LMWH GI prophylaxis: PPI Lines: N/A picc Foley:  N/A Code Status:  DNR Last date of multidisciplinary goals of care discussion []   Labs   CBC: Recent Labs  Lab 01/17/23 2335 01/18/23 0027 01/18/23 0403  WBC 12.7*  --  10.7*  NEUTROABS 9.5*  --   --   HGB 16.0 16.0 12.2*  HCT 54.9* 47.0 41.5  MCV 99.1  --  100.0  PLT 344  --  [id number]    Basic Metabolic Panel: Recent Labs  Lab 01/17/23 2335 01/18/23 0027 01/18/23 0403  NA 162* 167* 157*  K 3.5 3.6 3.1*  CL 119*  --  118*  CO2 31  --  32  GLUCOSE 381*  --  363*  BUN 77*  --  66*  CREATININE 0.98  --  0.82  CALCIUM 10.0  --  8.8*   GFR: Estimated Creatinine Clearance: 88 mL/min (by C-G formula based on SCr of 0.82 mg/dL). Recent Labs  Lab 01/17/23 2335 01/18/23 0236 01/18/23 0403  WBC 12.7*  --  10.7*  LATICACIDVEN 3.1* 3.2*  --     Liver Function Tests: Recent Labs  Lab 01/17/23 2335  AST 97*  ALT 156*  ALKPHOS 131*  BILITOT 0.5  PROT 8.1  ALBUMIN 2.4*   No results for input(s): "LIPASE", "AMYLASE" in the last 168 hours. No results for input(s): "AMMONIA" in the last 168 hours.  ABG    Component Value Date/Time   PHART 7.464 (H) 01/18/2023 0027   PCO2ART 46.0 01/18/2023 0027   PO2ART 121 (H) 01/18/2023 0027   HCO3 32.1 (H) 01/18/2023 0027   TCO2 33 (H) 01/18/2023 0027   O2SAT 98 01/18/2023 0027     Coagulation Profile: Recent Labs  Lab 01/17/23 2335  INR 1.3*    Cardiac  Enzymes: No results for input(s): "CKTOTAL", "CKMB", "CKMBINDEX", "TROPONINI" in the last 168 hours.  HbA1C: Hgb A1c MFr Bld  Date/Time Value Ref Range Status  12/29/2022 12:07 AM 9.2 (H) 4.8 - 5.6 % Final    Comment:    (NOTE)         Prediabetes: 5.7 - 6.4         Diabetes: >6.4         Glycemic control for adults with diabetes: <7.0   06/29/2022 06:33 AM 7.5 (H) 4.8 - 5.6 % Final    Comment:    (NOTE) Pre diabetes:          5.7%-6.4%  Diabetes:              >6.4%  Glycemic control for   <7.0% adults with diabetes     CBG: Recent Labs  Lab 01/17/23 2357  GLUCAP 357*    Review of Systems:   Unable to assess   Past Medical History:  He,  has a past medical history of Acute on chronic urinary retention, CAD (coronary artery disease), Diabetes mellitus without complication ([place]), Hypertension, Hypothyroidism, Kidney stone, TBI (traumatic brain injury) ([place]), and Tracheostomy in place [facility]).   Surgical History:   Past Surgical History:  Procedure Laterality Date   CHOLECYSTECTOMY     CRANIECTOMY     JEJUNOSTOMY FEEDING TUBE     LEFT HEART CATH AND CORONARY ANGIOGRAPHY N/A 01/22/2020   Procedure: LEFT HEART CATH AND CORONARY ANGIOGRAPHY and possible PCI and stent;  Surgeon: Yolonda Kida, MD;  Location: [facility];  Service: Cardiovascular;  Laterality: N/A;   tracheostomy       Social History:   reports that he has quit smoking. He has never used smokeless tobacco. He reports that he does not currently use alcohol.   Family History:  His family history is not on file.   Allergies Allergies  Allergen Reactions   Morphine And Related    Penicillins Other (See Comments)    Has tolerated Cefepime and Ceftriaxone   Statins Nausea And Vomiting     Home Medications  Prior to Admission medications   Medication Sig Start Date End Date Taking? Authorizing Provider  amantadine (SYMMETREL) 50 MG/5ML solution Place 100 mg into feeding tube daily.    Yes [provider]  Amino Acids-Protein Hydrolys (PRO-STAT AWC) LIQD Place 30 mLs into feeding tube daily.   Yes [provider]  ascorbic acid (VITAMIN C) 500 MG tablet Place 500 mg into feeding tube daily. Give 500mg   per tube daily   Yes [provider]  enoxaparin (LOVENOX) 40 MG/0.4ML injection Inject 40 mg into the skin daily.   Yes [provider]  ferrous sulfate 300 (60 Fe) MG/5ML syrup Place 300 mg into feeding tube 2 (two) times daily.   Yes [provider]  furosemide (LASIX) 20 MG tablet Place 20 mg into feeding tube 2 (two) times daily.   Yes [provider]  insulin aspart (NOVOLOG) 100 UNIT/ML injection insulin aspart (novoLOG) injection 0-15 Units 0-15 Units, Subcutaneous, Every 4 hours,  CBG < 70: Implement Hypoglycemia Standing Orders and refer to Hypoglycemia Standing Orders sidebar report CBG 70 - 120: 0 units CBG 121 - 150: 2 units CBG 151 - 200: 3 units CBG 201 - 250: 5 units CBG 251 - 300: 8 units CBG 301 - 350: 11 units CBG 351 - 400: 15 units CBG > 400: call MD Patient taking differently: Inject 0-21 Units into the skin in the morning, at noon, in the evening, and at bedtime. Sliding scale insulin: Blood sugar is <60 or >500, notify MD. Blood sugar is 0-150= 0 units Blood sugar is 151-200= 3 units Blood sugar is 201-250= 6 units Blood sugar is 251-300= 9 units Blood sugar is 301-350= 12 units Blood sugar is 351-400= 15 units Blood sugar is 401-450= 18 units Blood sugar is 451-500= 21 units 09/25/22  Yes Ghimire, Werner Lean, MD  insulin glargine (LANTUS) 100 UNIT/ML injection Inject 0.25 mLs (25 Units total) into the skin daily. Patient taking differently: Inject 30 Units into the skin daily. 09/25/22  Yes Ghimire, Werner Lean, MD  lansoprazole (PREVACID) 30 MG capsule Give 1 capsule (30mg ) per G tube daily   Yes [provider]  levothyroxine (SYNTHROID) 100 MCG tablet Place 100 mcg into feeding tube daily. Give  per tube daily   Yes [provider]  liothyronine (CYTOMEL) 5 MCG tablet Place 7.5 mcg into feeding tube every 12 (twelve) hours. Give one and one half tablet (7.5mg ) per tube every 12 hours   Yes [provider]  meropenem 1 g in sodium chloride 0.9 % 100 mL Inject 1 g into the vein every 8 (eight) hours. 12/30/22  Yes 02/28/23, MD  Methylcellulose, Laxative, (CITRUCEL) 500 MG TABS Place 500 mg into feeding tube 3 (three) times daily.   Yes [provider]  metoprolol tartrate (LOPRESSOR) 25 MG tablet Place 12.5 mg into feeding tube 2 (two) times daily.   Yes [provider]  midodrine (PROAMATINE) 5 MG tablet Place 1 tablet (5 mg total) into feeding tube 3 (three) times daily with meals. 12/30/22  Yes 02/28/23, MD  modafinil (PROVIGIL) 100 MG tablet Place 100 mg into feeding tube daily.   Yes [provider]  Multiple Vitamin (MULTIVITAMIN) LIQD Place 15 mLs into feeding tube daily. Patient taking differently: Place into feeding tube daily. Give 1 tablet 12/31/22  Yes 03/01/23, MD  Nutritional Supplements Quincy Simmonds) PACK Place 1 packet into feeding tube 2 (two) times daily. Arginaid 4.5gram-156mg /9.2 gram oral powder packet   Yes [provider]  Nutritional Supplements (FEEDING SUPPLEMENT, GLUCERNA 1.5 CAL,) LIQD Place 60 mL/hr into feeding tube See admin instructions. Give 62mL/hr per tube by shift   Yes [provider]  omega-3 acid ethyl esters (LOVAZA) 1 g capsule Place 1 g into feeding tube daily. Give 1 g per tube daily   Yes [provider]  ondansetron (ZOFRAN-ODT) 4 MG disintegrating tablet 4 mg every 6 (six)  hours as needed for nausea or vomiting.   Yes [provider]  polyethylene glycol (MIRALAX / GLYCOLAX) 17 g packet Give 17g per tube daily as needed for constipation   Yes [provider]  sertraline (ZOLOFT) 25 MG tablet Place 25 mg into feeding tube daily. Give 25mg  per tube  daily   Yes [provider]  sodium chloride irrigation 0.9 % irrigation Irrigate with 60 mLs as directed every 8 (eight) hours. Irrigate indwelling foley with 88mL sterile saline every 8 hours.   Yes [provider]  leptospermum manuka honey (MEDIHONEY) PSTE paste Apply 1 Application topically daily. 12/31/22   Iona Beard, MD  levETIRAcetam (KEPPRA) 100 MG/ML solution Place 250 mg into feeding tube every 12 (twelve) hours. Patient not taking: Reported on 12/28/2022    [provider]  sodium chloride HYPERTONIC 3 % nebulizer solution Take 4 mLs by nebulization 2 (two) times daily. Patient not taking: Reported on 01/18/2023 12/30/22   Iona Beard, MD  vancomycin (VANCOCIN) 1-5 GM/200ML-% SOLN Inject 200 mLs (1,000 mg total) into the vein every 12 (twelve) hours. Patient not taking: Reported on 01/18/2023 12/30/22   Iona Beard, MD  Water For Irrigation, Sterile (FREE WATER) SOLN Place 300 mLs into feeding tube every 4 (four) hours. Patient not taking: Reported on 01/18/2023 12/30/22   Iona Beard, MD     Critical care time: 23min

## 2023-01-18 NOTE — ED Notes (Signed)
MD Erin Fulling was consulted regarding patient tube feedings and there not being an order. MD advised this paramedic that patient was to be kept NPO and no nutrition at this time.

## 2023-01-18 NOTE — ED Notes (Signed)
MD Roxanne Mins made aware of BP

## 2023-01-18 NOTE — Hospital Course (Signed)
Sepsis [facility] AMS Recently tx for pneumonia Multiple dose metoprolol for tachycardia  Normally alert nor  Dc 1/3  ED course BP 85/60 105.1 F Glucose 380s  INR 1.3 WBC 12.7 Hgb 16 Na 162 Cl 119 BUN 77 Creatinine 0.98 ???? Baseline 0.4-0.6 AST 97 ASL 156 Alk phos 131 LA 3.1  Sacral decubitus  Sepsis protocol

## 2023-01-18 NOTE — Progress Notes (Signed)
Pt being followed by [facility] for Sepsis protocol. 

## 2023-01-18 NOTE — Progress Notes (Signed)
Pharmacy Antibiotic Note  Joshua Haley is a 72 y.o. male admitted on 01/17/2023 with pneumonia.  Pharmacy has been consulted for vancomycin and meropenem dosing.  Pt had been on OPAT with vanc and mero though last vanc dose was given 1/20 with report of red man syndrome.  Plan: Vancomycin 1750mg  x1 then 1000mg  IV Q12H. Goal AUC 400-550.  Expected AUC 430.  (Will slow infusion given report of red man syndrome.) Meropenem 1g IV Q8H.  Height: 5\' 11"  (180.3 cm) Weight: 83.5 kg (184 lb 1.4 oz) IBW/kg (Calculated) : 75.3  Temp (24hrs), Avg:103.4 F (39.7 C), Min:101.6 F (38.7 C), Max:105.2 F (40.7 C)  Recent Labs  Lab 01/17/23 2335 01/18/23 0236 01/18/23 0403  WBC 12.7*  --  10.7*  CREATININE 0.98  --  0.82  LATICACIDVEN 3.1* 3.2*  --     Estimated Creatinine Clearance: 88 mL/min (by C-G formula based on SCr of 0.82 mg/dL).    Allergies  Allergen Reactions   Morphine And Related    Penicillins Other (See Comments)    Has tolerated Cefepime and Ceftriaxone   Statins Nausea And Vomiting    Thank you for allowing pharmacy to be a part of this patient's care.  Wynona Neat, PharmD, BCPS  01/18/2023 7:04 AM

## 2023-01-19 DIAGNOSIS — R6521 Severe sepsis with septic shock: Secondary | ICD-10-CM | POA: Diagnosis not present

## 2023-01-19 DIAGNOSIS — A419 Sepsis, unspecified organism: Secondary | ICD-10-CM | POA: Diagnosis not present

## 2023-01-19 LAB — BASIC METABOLIC PANEL
Anion gap: 7 (ref 5–15)
BUN: 45 mg/dL — ABNORMAL HIGH (ref 8–23)
CO2: 29 mmol/L (ref 22–32)
Calcium: 8.7 mg/dL — ABNORMAL LOW (ref 8.9–10.3)
Chloride: 117 mmol/L — ABNORMAL HIGH (ref 98–111)
Creatinine, Ser: 0.48 mg/dL — ABNORMAL LOW (ref 0.61–1.24)
GFR, Estimated: >60 mL/min (ref >60)
Glucose, Bld: 193 mg/dL — ABNORMAL HIGH (ref 70–99)
Potassium: 3 mmol/L — ABNORMAL LOW (ref 3.5–5.1)
Sodium: 153 mmol/L — ABNORMAL HIGH (ref 135–145)

## 2023-01-19 LAB — POCT I-STAT 7, (LYTES, BLD GAS, ICA,H+H)
Acid-Base Excess: 6 mmol/L — ABNORMAL HIGH (ref 0.0–2.0)
Bicarbonate: 31.7 mmol/L — ABNORMAL HIGH (ref 20.0–28.0)
Calcium, Ion: 1.38 mmol/L (ref 1.15–1.40)
HCT: 34 % — ABNORMAL LOW (ref 39.0–52.0)
Hemoglobin: 11.6 g/dL — ABNORMAL LOW (ref 13.0–17.0)
O2 Saturation: 100 %
Patient temperature: 97
Potassium: 3.1 mmol/L — ABNORMAL LOW (ref 3.5–5.1)
Sodium: 160 mmol/L — ABNORMAL HIGH (ref 135–145)
TCO2: 33 mmol/L — ABNORMAL HIGH (ref 22–32)
pCO2 arterial: 46.8 mmHg (ref 32–48)
pH, Arterial: 7.436 (ref 7.35–7.45)
pO2, Arterial: 167 mmHg — ABNORMAL HIGH (ref 83–108)

## 2023-01-19 LAB — CBC
HCT: 39 % (ref 39.0–52.0)
Hemoglobin: 11.3 g/dL — ABNORMAL LOW (ref 13.0–17.0)
MCH: 28.5 pg (ref 26.0–34.0)
MCHC: 29 g/dL — ABNORMAL LOW (ref 30.0–36.0)
MCV: 98.5 fL (ref 80.0–100.0)
Platelets: 187 10*3/uL (ref 150–400)
RBC: 3.96 MIL/uL — ABNORMAL LOW (ref 4.22–5.81)
RDW: 16.8 % — ABNORMAL HIGH (ref 11.5–15.5)
WBC: 8.2 10*3/uL (ref 4.0–10.5)
nRBC: 0 % (ref 0.0–0.2)

## 2023-01-19 LAB — MAGNESIUM
Magnesium: 2.2 mg/dL (ref 1.7–2.4)
Magnesium: 2.1 mg/dL (ref 1.7–2.4)
Magnesium: 1.9 mg/dL (ref 1.7–2.4)

## 2023-01-19 LAB — GLUCOSE, CAPILLARY
Glucose-Capillary: 174 mg/dL — ABNORMAL HIGH (ref 70–99)
Glucose-Capillary: 187 mg/dL — ABNORMAL HIGH (ref 70–99)
Glucose-Capillary: 176 mg/dL — ABNORMAL HIGH (ref 70–99)
Glucose-Capillary: 183 mg/dL — ABNORMAL HIGH (ref 70–99)
Glucose-Capillary: 202 mg/dL — ABNORMAL HIGH (ref 70–99)

## 2023-01-19 LAB — MRSA NEXT GEN BY PCR, NASAL: MRSA by PCR Next Gen: NOT DETECTED

## 2023-01-19 LAB — URINE CULTURE: Culture: >=100000 — AB

## 2023-01-19 LAB — T4, FREE: Free T4: 0.74 ng/dL (ref 0.61–1.12)

## 2023-01-19 LAB — PHOSPHORUS
Phosphorus: 3.2 mg/dL (ref 2.5–4.6)
Phosphorus: 2.4 mg/dL — ABNORMAL LOW (ref 2.5–4.6)
Phosphorus: 4.1 mg/dL (ref 2.5–4.6)

## 2023-01-19 LAB — PROCALCITONIN: Procalcitonin: 0.58 ng/mL

## 2023-01-19 LAB — TSH: TSH: 3.512 u[IU]/mL (ref 0.350–4.500)

## 2023-01-19 MED ORDER — ORAL CARE MOUTH RINSE: 15.0000 mL | OROMUCOSAL | Status: DC | PRN

## 2023-01-19 MED ORDER — INSULIN GLARGINE-YFGN 100 UNIT/ML ~~LOC~~ SOLN
10.0000 [IU] | Freq: Every day | SUBCUTANEOUS | Status: DC
  Administered 2023-01-19: 10 [IU] via SUBCUTANEOUS
  Filled 2023-01-19 (×2): qty 0.1

## 2023-01-19 MED ORDER — GLUCERNA 1.5 CAL PO LIQD
1000.0000 mL | ORAL | Status: DC
  Administered 2023-01-19 – 2023-01-27 (×9): 1000 mL
  Filled 2023-01-19 (×15): qty 1000

## 2023-01-19 MED ORDER — POTASSIUM CHLORIDE 10 MEQ/50ML IV SOLN
10.0000 meq | INTRAVENOUS | Status: AC
  Administered 2023-01-19 (×6): 10 meq via INTRAVENOUS
  Filled 2023-01-19 (×6): qty 50

## 2023-01-19 MED ORDER — LIP MEDEX EX OINT
TOPICAL_OINTMENT | CUTANEOUS | Status: DC | PRN
  Filled 2023-01-19: qty 7

## 2023-01-19 MED ORDER — FREE WATER
200.0000 mL | Freq: Four times a day (QID) | Status: DC
  Administered 2023-01-19 – 2023-01-20 (×4): 200 mL

## 2023-01-19 MED ORDER — INSULIN GLARGINE-YFGN 100 UNIT/ML ~~LOC~~ SOLN
10.0000 [IU] | Freq: Every day | SUBCUTANEOUS | Status: DC
  Filled 2023-01-19: qty 0.1

## 2023-01-19 MED ORDER — ORAL CARE MOUTH RINSE
15.0000 mL | OROMUCOSAL | Status: DC
  Administered 2023-01-19 (×6): 15 mL via OROMUCOSAL

## 2023-01-19 MED ORDER — ORAL CARE MOUTH RINSE
15.0000 mL | OROMUCOSAL | Status: DC
  Administered 2023-01-19 – 2023-01-27 (×89): 15 mL via OROMUCOSAL

## 2023-01-19 NOTE — TOC Initial Note (Signed)
Transition of Care [facility]) - Initial/Assessment Note    Patient Details  Name: Joshua Haley MRN: [id number] Date of Birth: [birth year]  Transition of Care [facility]) CM/SW Contact:    Carles Collet, RN Phone Number: 01/19/2023, 11:37 AM  Clinical Narrative:                  LVM w Angie at [facility], awaiting callback  Expected Discharge Plan: [place] Barriers to Discharge: Continued Medical Work up   Patient Goals and CMS Choice            Expected Discharge Plan and Services                                              Prior Living Arrangements/Services                       Activities of Daily Living      Permission Sought/Granted                  Emotional Assessment              Admission diagnosis:  Dehydration [E86.0] Hypernatremia [E87.0] Elevated serum alkaline phosphatase level [R74.8] Hospital-acquired pneumonia [J18.9, Y95] Elevated transaminase level [R74.01] Sepsis ([place]) [A41.9] Sepsis due to undetermined organism [facility]) [A41.9] Patient Active Problem List   Diagnosis Date Noted   Sepsis ([place]) 12/28/2022   Pneumonia of both lower lobes due to infectious organism 12/28/2022   Malnutrition of moderate degree 09/12/2022   Urethral bleeding 09/11/2022   Hyperglycemia 09/11/2022   Acute encephalopathy 09/11/2022   Sacral decubitus ulcer 09/11/2022   CAD (coronary artery disease) 09/11/2022   Septic shock ([place]) 09/10/2022   Gross hematuria    Aspiration into airway    Chronic respiratory failure with hypoxia ([place])    Acute on chronic respiratory failure with hypoxia ([place]) 06/21/2022   Seizure disorder ([place]) 06/21/2022   Aspiration pneumonia ([place]) 06/21/2022   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 [place] [address] Phone: [phone] Fax: [phone]     Social Determinants of Health (SDOH) Social History: SDOH Screenings   Depression [id number]): Low Risk  (02/22/2020)  Tobacco Use: Medium Risk (12/12/2022)   SDOH Interventions:     Readmission Risk Interventions     No data to display

## 2023-01-19 NOTE — Progress Notes (Signed)
NAME:  Joshua Haley, MRN:  [id number], DOB:  30-May-1951, LOS: 1 ADMISSION DATE:  01/17/2023, CONSULTATION DATE:  01/18/23 REFERRING MD:  Roxanne Mins, CHIEF COMPLAINT:  altered    History of Present Illness:  72 yo man with a hx of HTN, DM, CAD, TBI, chronic resp failure (vent dependent), here with AMS.  Started on Vanc for PNA about 3 days ago.  Tachycardic today.  Baseline mental status: alert but non verbal.  Sacral decubitus ulcer.  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 Coli UTI and bilateral pneumonia on 12/28/2022.  Initially febrile and less responsive at [facility] and requiring increased O2 on trach collar.   Significant Hospital Events: Including procedures, antibiotic start and stop dates in addition to other pertinent events   1/22 admit to ICU septic shock PNA, looks very dehydrated as well   Interim History / Subjective:  Patient is intubated and sedated.  Minimally responsive.  I spoke to patient's wife, Hope today.  She states that his baseline is alert, awake, track and able to use his right side extremities.  His baseline O2 is FiO2 of 28%.  He was on Keppra in the past for post TBI seizure last year.  He had an order to discontinue Keppra in December but another doctor told him to continue [place]. Objective   Blood pressure (!) 97/55, pulse 79, temperature 97.9 F (36.6 C), temperature source Oral, resp. rate 15, height 5\' 11"  (1.803 m), weight 79.9 kg, SpO2 98 %.    Vent Mode: PRVC FiO2 (%):  [40 %-50 %] 40 % Set Rate:  [15 bmp] 15 bmp Vt Set:  [500 mL] 500 mL PEEP:  [5 cmH20] 5 cmH20 Plateau Pressure:   [14 cmH20] 14 cmH20   Intake/Output Summary (Last 24 hours) at 01/19/2023 0902 Last data filed at 01/19/2023 0641 Gross per 24 hour  Intake 1396.45 ml  Output 1450 ml  Net -53.55 ml    Filed Weights   01/17/23 2334 01/19/23 0310  Weight: 83.5 kg 79.9 kg    Examination: General: Chronically ill trach vent.  Minimally responsive HENT: Tracheostomy in place Lungs: Coarse.  Cardiovascular: rrr Abdomen: + PEG, soft  Extremities: Symmetrical muscle wasting  Neuro: Open eyes to verbal stimulation.  Falls back to sleep very quickly.   Resolved Hospital Problem list     Assessment & Plan:  Septic shock, unclear etiology at this time (PNA, UTI, bacteremia with PICC  Possibly hypovolemic shock superimposed  The most likely source is left basilar pneumonia.  Blood culture negative to date.  MRSA negative.  Respiratory culture pending.  Urine culture grew yeast. -Continue meropenem for now.  Discontinue vancomycin -Continue home midodrine 5 mg TID -Now off of pressor -Continue tube feed and free water  Acute on chronic respiratory failure with hypoxia  Trach dependent (baseline FiO2 28%) -Continue vent support.  Wean as able. -Continue IV antibiotics as above  Acute metabolic/infectious encephalopathy History of post TBI seizure Baseline is alert, awake, track and able to use his right side extremities.   Multifactorial in the setting of infection and hypernatremia.  No reported seizure activity. -Continue IV  antibiotic as above -Increase free water flushes to 200 cc Q6h -Keppra was on the discharge summary on his last admission on 1/1.  We had a notes from [facility] on 1/16 that said to continue prophylactic seizure medication.  No indication to resume Keppra at this time.  May need EEG for any change in mental status.  Will figure out if he needs to be on Keppra long-term.  Hypernatremia -Free water flushes 200 cc every 6 hours  Hypothyroidism  Not sure why he is on both T3  and T4 supplementation at home. -Check TSH and T4 -Continue home levothyroxine and liothyronine  CAD status post CABG Holding Lasix and metoprolol in the setting of septic shock  Chronic malnutrition -Continue tube feed  T2DM -added long acting insulin 10 u  Update wife via phone  Best Practice (right click and "Reselect all SmartList Selections" daily)   Diet/type: tubefeeds DVT prophylaxis: systemic dose LMWH GI prophylaxis: PPI Lines: N/A picc Foley:  Yes Code Status:  DNR Last date of multidisciplinary goals of care discussion [NA]  Labs   CBC: Recent Labs  Lab 01/17/23 2335 01/18/23 0027 01/18/23 0403 01/19/23 0306 01/19/23 0317  WBC 12.7*  --  10.7*  --  8.2  NEUTROABS 9.5*  --   --   --   --   HGB 16.0 16.0 12.2* 11.6* 11.3*  HCT 54.9* 47.0 41.5 34.0* 39.0  MCV 99.1  --  100.0  --  98.5  PLT 344  --  202  --  187     Basic Metabolic Panel: Recent Labs  Lab 01/17/23 2335 01/18/23 0027 01/18/23 0403 01/18/23 1139 01/18/23 1747 01/19/23 0306 01/19/23 0317  NA 162* 167* 157*  --  154* 160* 153*  K 3.5 3.6 3.1*  --  2.8* 3.1* 3.0*  CL 119*  --  118*  --  118*  --  117*  CO2 31  --  32  --  31  --  29  GLUCOSE 381*  --  363*  --  213*  --  193*  BUN 77*  --  66*  --  45*  --  45*  CREATININE 0.98  --  0.82  --  0.50*  --  0.48*  CALCIUM 10.0  --  8.8*  --  8.7*  --  8.7*  MG  --   --   --  1.9 1.8  --  2.2  PHOS  --   --   --  1.8* 1.8*  --  3.2    GFR: Estimated Creatinine Clearance: 90.2 mL/min (A) (by C-G formula based on SCr of 0.48 mg/dL (L)). Recent Labs  Lab 01/17/23 2335 01/18/23 0236 01/18/23 0403 01/18/23 1139 01/18/23 1602 01/18/23 2000 01/19/23 0317  PROCALCITON  --   --   --  0.66  --   --  0.58  WBC 12.7*  --  10.7*  --   --   --  8.2  LATICACIDVEN 3.1* 3.2*  --   --  1.9 1.7  --      Liver Function Tests: Recent Labs  Lab 01/17/23 2335  AST 97*  ALT 156*  ALKPHOS 131*  BILITOT 0.5  PROT 8.1  ALBUMIN 2.4*     No results for input(s): "LIPASE", "AMYLASE" in the last 168 hours. No results for input(s): "AMMONIA" in the last 168 hours.  ABG    Component Value Date/Time   PHART 7.436 01/19/2023 0306   PCO2ART 46.8 01/19/2023 0306  PO2ART 167 (H) 01/19/2023 0306   HCO3 31.7 (H) 01/19/2023 0306   TCO2 33 (H) 01/19/2023 0306   O2SAT 100 01/19/2023 0306     Coagulation Profile: Recent Labs  Lab 01/17/23 2335  INR 1.3*     Cardiac Enzymes: No results for input(s): "CKTOTAL", "CKMB", "CKMBINDEX", "TROPONINI" in the last 168 hours.  HbA1C: Hgb A1c MFr Bld  Date/Time Value Ref Range Status  12/29/2022 12:07 AM 9.2 (H) 4.8 - 5.6 % Final    Comment:    (NOTE)         Prediabetes: 5.7 - 6.4         Diabetes: >6.4         Glycemic control for adults with diabetes: <7.0   06/29/2022 06:33 AM 7.5 (H) 4.8 - 5.6 % Final    Comment:    (NOTE) Pre diabetes:          5.7%-6.4%  Diabetes:              >6.4%  Glycemic control for   <7.0% adults with diabetes     CBG: Recent Labs  Lab 01/18/23 2129 01/18/23 2152 01/18/23 2347 01/19/23 0324 01/19/23 0745  GLUCAP 189* 198* 196* 174* 187*    CRITICAL CARE Doran Stabler, DO Internal Medicine Residency My pager: [phone]

## 2023-01-19 NOTE — Progress Notes (Signed)
Initial Nutrition Assessment  DOCUMENTATION CODES:  Not applicable  INTERVENTION:  Adjust TF back to home regimen via PEG: Glucerna 1.5 at 60 ml/h (1440 ml per day) 250mL free water q6h per MD Provides 2160 kcal, 119 gm protein, 1893 ml free water daily 1 packet Juven BID, each packet provides 95 calories, 2.5 grams of protein (collagen), and 9.8 grams of carbohydrate (3 grams sugar); also contains 7 grams of L-arginine and L-glutamine, 300 mg vitamin C, 15 mg vitamin E, 1.2 mcg vitamin B-12, 9.5 mg zinc, 200 mg calcium, and 1.5 g  Calcium Beta-hydroxy-Beta-methylbutyrate to support wound healing  NUTRITION DIAGNOSIS:  Inadequate oral intake related to inability to eat as evidenced by  (chronic PEG tube dependent).  GOAL:  Patient will meet greater than or equal to 90% of their needs  MONITOR:  Labs, Vent status, TF tolerance, Weight trends  REASON FOR ASSESSMENT:  Consult Enteral/tube feeding initiation and management  ASSESSMENT:  Pt with hx of TBI s/p PEG and trach, DM type 2 and HTN presented to ED from [facility] with AMS  Pt has been followed by RD team during previous admissions. Reviewed notes from prior stay.  Patient is currently intubated on chronic ventilator support via trach. Nonverbal, no family in room. Pt no longer on pressor support this AM. Adjusted back to home TF regimen. Weight appears slightly up from last RD assessment 1/2. For fat deficits are less pronounced on exam. Wounds overall seem stable.  MV: 7.5 L/min Temp (24hrs), Avg:98 F (36.7 C), Min:97 F (36.1 C), Max:98.8 F (37.1 C)   Intake/Output Summary (Last 24 hours) at 01/19/2023 1428 Last data filed at 01/19/2023 1400 Gross per 24 hour  Intake 2118.03 ml  Output 1750 ml  Net 368.03 ml  Net IO Since Admission: 2,309.95 mL [01/19/23 1428]  Nutritionally Relevant Medications: Scheduled Meds:  famotidine  20 mg Per Tube BID   feeding supplement (PROSource TF20)  60 mL Per Tube Daily    feeding supplement (VITAL HIGH PROTEIN)  1,000 mL Per Tube Q24H   free water  200 mL Per Tube Q6H   insulin aspart  0-15 Units Subcutaneous Q4H   insulin glargine-yfgn  10 Units Subcutaneous Daily   Continuous Infusions:  potassium chloride 10 mEq (01/19/23 1027)   PRN Meds: docusate sodium, polyethylene glycol  Labs Reviewed: Na 153, chloride 117 K 3 BUN 45, creatinine 0.48 CBG ranges from 174-198 mg/dL over the last 24 hours HgbA1c 9.2%   NUTRITION - FOCUSED PHYSICAL EXAM: Flowsheet Row Most Recent Value  Orbital Region Mild depletion  Upper Arm Region No depletion  Thoracic and Lumbar Region No depletion  Buccal Region No depletion  Temple Region Moderate depletion  Clavicle Bone Region No depletion  Clavicle and Acromion Bone Region Mild depletion  Scapular Bone Region Moderate depletion  Dorsal Hand No depletion  Patellar Region Severe depletion  Anterior Thigh Region Severe depletion  Posterior Calf Region Severe depletion  Edema (RD Assessment) Mild  Hair Reviewed  Eyes Reviewed  Mouth Reviewed  Skin Reviewed  Nails Reviewed    Diet Order:   Diet Order             Diet NPO time specified  Diet effective now                   EDUCATION NEEDS:   Not appropriate for education at this time  Skin:  Skin Assessment: Reviewed RN Assessment Stage 1: Left upper back Stage 2: Right Hip  Stage 3: Left sacrum Unstageable: Left ischial tuberosity   Last BM:  1/23 - type 6  Height:   Ht Readings from Last 1 Encounters:  01/17/23 5\' 11"  (1.803 m)    Weight:   Wt Readings from Last 1 Encounters:  01/19/23 79.9 kg    Ideal Body Weight:  78.2 kg  BMI:  Body mass index is 24.57 kg/m.  Estimated Nutritional Needs:  Kcal:  2200-2500 kcal/d Protein:  110-130g/d Fluid:  2.2-2.5L/d    Ranell Patrick, RD, LDN Clinical Dietitian RD pager # available in [place]  After hours/weekend pager # available in [facility]

## 2023-01-19 NOTE — Progress Notes (Signed)
Attending:    Subjective: Broght from [facility] for change in mental status after he had been treated for several days with vancomycin prior Hypotensive this morning Baseline trach/PEG after complications of TBI, baseline sacral wound Remains mechanically ventilated Not on any drips this morning Hypernatremia> improving Renal function is improving    Objective: Vitals:   01/19/23 0800 01/19/23 0835 01/19/23 0900 01/19/23 1000  BP: (!) 91/59  93/64 (!) 88/56  Pulse: 81  82 85  Resp: 15  15 16   Temp:      TempSrc:      SpO2: 98% 98% 99% 95%  Weight:      Height:       Vent Mode: PRVC FiO2 (%):  [40 %-50 %] 40 % Set Rate:  [15 bmp] 15 bmp Vt Set:  [500 mL] 500 mL PEEP:  [5 cmH20] 5 cmH20 Plateau Pressure:  [14 cmH20] 14 cmH20  Intake/Output Summary (Last 24 hours) at 01/19/2023 1056 Last data filed at 01/19/2023 1000 Gross per 24 hour  Intake 1700.75 ml  Output 1450 ml  Net 250.75 ml    General:  In bed on vent HENT: skull deformities, 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 PULM: CTA B, vent supported breathing CV: RRR, no mgr GI: BS+, soft, nontender MSK: normal bulk and tone Neuro: sedated on vent opens eyes to voice    CBC    Component Value Date/Time   WBC 8.2 01/19/2023 0317   RBC 3.96 (L) 01/19/2023 0317   HGB 11.3 (L) 01/19/2023 0317   HCT 39.0 01/19/2023 0317   PLT 187 01/19/2023 0317   MCV 98.5 01/19/2023 0317   MCH 28.5 01/19/2023 0317   MCHC 29.0 (L) 01/19/2023 0317   RDW 16.8 (H) 01/19/2023 0317   LYMPHSABS 1.9 01/17/2023 2335   MONOABS 1.1 (H) 01/17/2023 2335   EOSABS 0.0 01/17/2023 2335   BASOSABS 0.1 01/17/2023 2335    BMET    Component Value Date/Time   NA 153 (H) 01/19/2023 0317   K 3.0 (L) 01/19/2023 0317   CL 117 (H) 01/19/2023 0317   CO2 29 01/19/2023 0317   GLUCOSE 193 (H) 01/19/2023 0317   BUN 45 (H) 01/19/2023 0317   CREATININE 0.48 (L) 01/19/2023 0317   CALCIUM 8.7 (L) 01/19/2023 0317   GFRNONAA >60 01/19/2023 0317   GFRAA >60  05/15/2020 1921    CXR images reviewed, mild left lower lobe infiltrate  Impression/Plan: Septic shock from HCAP, complicated by hypovolemia and chronic hypotension at home > wean off levophed for MAP > 65 > no further fluids > midodrine to continue  > continue mero, d/c vanc  Acute metabolic encephalopathy > minimize sedation > continue to treat hypernatremia with free water  Seizure disoder? > hold keppra for now > could restart keppra if mental status improved  Hypernatremia > continue free water via tube  Chronic respiratory failure with hypoxemia Tracheostomy status > continue full vent support > continue trach per routine  Sacral wound present on admission > continue wound care   My cc time 33 minutes  Roselie Awkward, MD [place] [facility] Pager: [phone] Cell: [phone] After 7pm: [phone]

## 2023-01-19 NOTE — Progress Notes (Signed)
[name] ADULT ICU REPLACEMENT PROTOCOL   The patient does apply for the [facility] Adult ICU Electrolyte Replacment Protocol based on the criteria listed below:   1.Exclusion criteria: TCTS, ECMO, Dialysis, and Myasthenia Gravis patients 2. Is GFR >/= 30 ml/min? Yes.    Patient's GFR today is >60 3. Is SCr </= 2? Yes.   Patient's SCr is 0.48 mg/dL 4. Did SCr increase >/= 0.5 in 24 hours? No. 5.Pt's weight >40kg  Yes.   6. Abnormal electrolyte(s): potassium 3.0  7. Electrolytes replaced per protocol 8.  Call MD STAT for K+ </= 2.5, Phos </= 1, or Mag </= 1 Physician:  n/a  Joshua Haley 01/19/2023 5:27 AM

## 2023-01-20 DIAGNOSIS — A419 Sepsis, unspecified organism: Secondary | ICD-10-CM | POA: Diagnosis not present

## 2023-01-20 LAB — CBC
HCT: 34.9 % — ABNORMAL LOW (ref 39.0–52.0)
Hemoglobin: 10.4 g/dL — ABNORMAL LOW (ref 13.0–17.0)
MCH: 29.3 pg (ref 26.0–34.0)
MCHC: 29.8 g/dL — ABNORMAL LOW (ref 30.0–36.0)
MCV: 98.3 fL (ref 80.0–100.0)
Platelets: 187 10*3/uL (ref 150–400)
RBC: 3.55 MIL/uL — ABNORMAL LOW (ref 4.22–5.81)
RDW: 16.5 % — ABNORMAL HIGH (ref 11.5–15.5)
WBC: 6.2 10*3/uL (ref 4.0–10.5)
nRBC: 0.3 % — ABNORMAL HIGH (ref 0.0–0.2)

## 2023-01-20 LAB — BASIC METABOLIC PANEL
Anion gap: 5 (ref 5–15)
BUN: 32 mg/dL — ABNORMAL HIGH (ref 8–23)
CO2: 29 mmol/L (ref 22–32)
Calcium: 8.6 mg/dL — ABNORMAL LOW (ref 8.9–10.3)
Chloride: 119 mmol/L — ABNORMAL HIGH (ref 98–111)
Creatinine, Ser: 0.44 mg/dL — ABNORMAL LOW (ref 0.61–1.24)
GFR, Estimated: >60 mL/min (ref >60)
Glucose, Bld: 208 mg/dL — ABNORMAL HIGH (ref 70–99)
Potassium: 3.7 mmol/L (ref 3.5–5.1)
Sodium: 153 mmol/L — ABNORMAL HIGH (ref 135–145)

## 2023-01-20 LAB — GLUCOSE, CAPILLARY
Glucose-Capillary: 181 mg/dL — ABNORMAL HIGH (ref 70–99)
Glucose-Capillary: 183 mg/dL — ABNORMAL HIGH (ref 70–99)
Glucose-Capillary: 212 mg/dL — ABNORMAL HIGH (ref 70–99)
Glucose-Capillary: 217 mg/dL — ABNORMAL HIGH (ref 70–99)
Glucose-Capillary: 221 mg/dL — ABNORMAL HIGH (ref 70–99)
Glucose-Capillary: 252 mg/dL — ABNORMAL HIGH (ref 70–99)
Glucose-Capillary: 283 mg/dL — ABNORMAL HIGH (ref 70–99)

## 2023-01-20 LAB — PHOSPHORUS: Phosphorus: 1.9 mg/dL — ABNORMAL LOW (ref 2.5–4.6)

## 2023-01-20 LAB — MAGNESIUM: Magnesium: 2.4 mg/dL (ref 1.7–2.4)

## 2023-01-20 LAB — AMMONIA: Ammonia: 31 umol/L (ref 9–35)

## 2023-01-20 MED ORDER — INSULIN GLARGINE-YFGN 100 UNIT/ML ~~LOC~~ SOLN
15.0000 [IU] | Freq: Every day | SUBCUTANEOUS | Status: DC
  Administered 2023-01-20 – 2023-01-21 (×2): 15 [IU] via SUBCUTANEOUS
  Filled 2023-01-20 (×2): qty 0.15

## 2023-01-20 MED ORDER — GERHARDT'S BUTT CREAM
TOPICAL_CREAM | Freq: Three times a day (TID) | CUTANEOUS | Status: DC | PRN
  Filled 2023-01-20 (×2): qty 1

## 2023-01-20 MED ORDER — FREE WATER
200.0000 mL | Status: DC
  Administered 2023-01-20 – 2023-01-21 (×6): 200 mL

## 2023-01-20 MED ORDER — SODIUM CHLORIDE 0.9 % IV SOLN
1.0000 g | Freq: Three times a day (TID) | INTRAVENOUS | Status: DC
  Administered 2023-01-20 – 2023-01-21 (×3): 1 g via INTRAVENOUS
  Filled 2023-01-20 (×2): qty 20

## 2023-01-20 MED ORDER — MIDODRINE HCL 5 MG PO TABS
10.0000 mg | ORAL_TABLET | Freq: Three times a day (TID) | ORAL | Status: DC
  Administered 2023-01-20 – 2023-01-27 (×22): 10 mg via NASOGASTRIC
  Filled 2023-01-20 (×22): qty 2

## 2023-01-20 MED ORDER — POTASSIUM PHOSPHATES 15 MMOLE/5ML IV SOLN
30.0000 mmol | Freq: Once | INTRAVENOUS | Status: AC
  Administered 2023-01-20: 30 mmol via INTRAVENOUS
  Filled 2023-01-20: qty 10

## 2023-01-20 NOTE — TOC Initial Note (Signed)
Transition of Care [facility]) - Initial/Assessment Note    Patient Details  Name: Joshua Haley MRN: [id number] Date of Birth: 1951-04-21  Transition of Care [facility]) CM/SW Contact:    Cyndi Bender, RN Phone Number: 01/20/2023, 9:21 AM  Clinical Narrative:                  Patient from [facility]. Patient has history of DH and TBI after fall in 2022 with subsequent trach/PED and admission to [facility], CAD s/p CABG, and DM who was admitted to sepsis due to ESBL e. Coli UTI and bilateral pneumonia on 12/28/2022.  Initially febrile and less responsive at [facility] and requiring increased O2 on trach collar.    1/22 admit to ICU septic shock PNA. Patient is intubated and sedated.   TOC will continue to follow.    Expected Discharge Plan: [place] ([facility]) Barriers to Discharge: Continued Medical Work up   Patient Goals and CMS Choice            Expected Discharge Plan and Services       Living arrangements for the past 2 months: [place] ([facility])                                      Prior Living Arrangements/Services Living arrangements for the past 2 months: [place] ([facility])                     Activities of Daily Living      Permission Sought/Granted                  Emotional Assessment              Admission diagnosis:  Dehydration [E86.0] Hypernatremia [E87.0] Elevated serum alkaline phosphatase level [R74.8] Hospital-acquired pneumonia [J18.9, Y95] Elevated transaminase level [R74.01] Sepsis ([place]) [A41.9] Sepsis due to undetermined organism [facility]) [A41.9] Patient Active Problem List   Diagnosis Date Noted   Sepsis ([place]) 12/28/2022   Pneumonia of both lower lobes due to infectious organism 12/28/2022   Malnutrition of moderate degree 09/12/2022   Urethral bleeding 09/11/2022   Hyperglycemia 09/11/2022   Acute encephalopathy 09/11/2022   Sacral decubitus ulcer 09/11/2022    CAD (coronary artery disease) 09/11/2022   Septic shock ([place]) 09/10/2022   Gross hematuria    Aspiration into airway    Chronic respiratory failure with hypoxia ([place])    Acute on chronic respiratory failure with hypoxia ([place]) 06/21/2022   Seizure disorder ([place]) 06/21/2022   Aspiration pneumonia ([place]) 06/21/2022   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 [place] [address] Phone: [phone] Fax: [phone]     Social Determinants of Health (SDOH) Social History: SDOH Screenings   Depression [phone]): Low Risk  (02/22/2020)  Tobacco Use: Medium Risk (12/12/2022)   SDOH Interventions:     Readmission Risk Interventions     No data to display

## 2023-01-20 NOTE — Progress Notes (Signed)
Phos 1.9 °Replaced per protocol  °

## 2023-01-20 NOTE — Progress Notes (Addendum)
NAME:  Joshua Haley, MRN:  [id number], DOB:  08-18-1951, LOS: 2 ADMISSION DATE:  01/17/2023, CONSULTATION DATE:  01/18/23 REFERRING MD:  Roxanne Mins, CHIEF COMPLAINT:  altered    History of Present Illness:  72 yo man with a hx of HTN, DM, CAD, TBI, chronic resp failure (vent dependent), here with AMS.  Started on Vanc for PNA about 3 days ago.  Tachycardic today.  Baseline mental status: alert but non verbal.  Sacral decubitus ulcer.  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 Coli UTI and bilateral pneumonia on 12/28/2022.  Initially febrile and less responsive at [facility] and requiring increased O2 on trach collar.   Significant Hospital Events: Including procedures, antibiotic start and stop dates in addition to other pertinent events   1/22 admit to ICU septic shock PNA, looks very dehydrated as well   Interim History / Subjective:  Patient is intubated and sedated.  Minimally responsive.  Objective   Blood pressure (!) 94/56, pulse 79, temperature 98.6 F (37 C), temperature source Axillary, resp. rate 17, height 5\' 11"  (1.803 m), weight 79.9 kg, SpO2 99 %.    Vent Mode: PRVC FiO2 (%):  [40 %] 40 % Set Rate:  [15 bmp] 15 bmp Vt Set:  [500 mL] 500 mL PEEP:  [5 cmH20] 5 cmH20 Plateau Pressure:  [13 cmH20-14 cmH20] 13 cmH20   Intake/Output Summary (Last 24 hours) at 01/20/2023 0835 Last data filed at 01/20/2023 0622 Gross per 24 hour  Intake 2276.49 ml  Output 1250 ml  Net 1026.49 ml    Filed Weights   01/17/23 2334 01/19/23 0310 01/20/23 0356  Weight: 83.5 kg 79.9 kg 79.9 kg    Examination: General:  Chronically ill trach vent.  Minimally responsive.  HENT: Tracheostomy in place Lungs: Coarse.  Cardiovascular: rrr Abdomen: + PEG, soft  Extremities: Symmetrical muscle wasting  Neuro: Not opening eyes   Resolved Hospital Problem list     Assessment & Plan:  Septic shock, unclear etiology at this time (PNA, UTI, bacteremia with PICC  Possibly hypovolemic shock superimposed  The most likely source is left basilar pneumonia.  Blood culture negative to date.  MRSA negative.  Respiratory culture pending.  Urine culture grew yeast. -Continue meropenem for now.   -Continue home midodrine 5 mg TID -Not on pressor -Continue tube feed and free water  Acute on chronic respiratory failure with hypoxia  Trach dependent (baseline FiO2 28%) -Continue vent support.  Wean as able. -Continue IV antibiotics as above  Acute metabolic/infectious encephalopathy History of post TBI seizure Baseline is alert, awake, track and able to use his right side extremities.   Multifactorial in the setting of infection and hypernatremia.  No reported seizure activity. -Continue IV antibiotic as above -Increase free water flushes to 200 cc Q6h -Keppra was on the discharge summary on his last admission on 1/1.  We had a notes from [facility] on 1/16 that said to continue prophylactic seizure medication.  No indication to resume Keppra at this time.  May need EEG for any change in mental status.    Hypernatremia -Free water flushes 200 cc every 4 hours  Hypothyroidism  Not sure why he is on both T3 and T4 supplementation at home. TSH and T4 WNL.  -Continue home levothyroxine and liothyronine  CAD status post CABG Holding Lasix and metoprolol in the setting of septic shock  Chronic malnutrition -Continue tube feed  T2DM -added long acting insulin 10 u  Update wife via phone  Best Practice (right click and "Reselect all SmartList Selections" daily)   Diet/type: tubefeeds DVT prophylaxis: systemic dose  LMWH GI prophylaxis: PPI Lines: N/A picc Foley:  Yes Code Status:  DNR Last date of multidisciplinary goals of care discussion [NA]  Labs   CBC: Recent Labs  Lab 01/17/23 2335 01/18/23 0027 01/18/23 0403 01/19/23 0306 01/19/23 0317 01/20/23 0349  WBC 12.7*  --  10.7*  --  8.2 6.2  NEUTROABS 9.5*  --   --   --   --   --   HGB 16.0 16.0 12.2* 11.6* 11.3* 10.4*  HCT 54.9* 47.0 41.5 34.0* 39.0 34.9*  MCV 99.1  --  100.0  --  98.5 98.3  PLT 344  --  202  --  187 187     Basic Metabolic Panel: Recent Labs  Lab 01/17/23 2335 01/18/23 0027 01/18/23 0403 01/18/23 1139 01/18/23 1747 01/19/23 0306 01/19/23 0317 01/19/23 1330 01/19/23 1700 01/20/23 0349  NA 162*   < > 157*  --  154* 160* 153*  --   --  153*  K 3.5   < > 3.1*  --  2.8* 3.1* 3.0*  --   --  3.7  CL 119*  --  118*  --  118*  --  117*  --   --  119*  CO2 31  --  32  --  31  --  29  --   --  29  GLUCOSE 381*  --  363*  --  213*  --  193*  --   --  208*  BUN 77*  --  66*  --  45*  --  45*  --   --  32*  CREATININE 0.98  --  0.82  --  0.50*  --  0.48*  --   --  0.44*  CALCIUM 10.0  --  8.8*  --  8.7*  --  8.7*  --   --  8.6*  MG  --   --   --    < > 1.8  --  2.2 2.1 1.9 2.4  PHOS  --   --   --    < > 1.8*  --  3.2 2.4* 4.1 1.9*   < > = values in this interval not displayed.    GFR: Estimated Creatinine Clearance: 90.2 mL/min (A) (by C-G formula based on SCr of 0.44 mg/dL (L)). Recent Labs  Lab 01/17/23 2335 01/18/23 0236 01/18/23 0403 01/18/23 1139 01/18/23 1602 01/18/23 2000 01/19/23 0317 01/20/23 0349  PROCALCITON  --   --   --  0.66  --   --  0.58  --   WBC 12.7*  --  10.7*  --   --   --  8.2 6.2  LATICACIDVEN 3.1* 3.2*  --   --  1.9 1.7  --   --      Liver Function Tests: Recent Labs  Lab 01/17/23 2335  AST 97*  ALT 156*  ALKPHOS 131*  BILITOT 0.5  PROT 8.1  ALBUMIN 2.4*    No results for input(s): "LIPASE", "AMYLASE" in the last 168 hours. No results  for input(s): "AMMONIA" in the  last 168 hours.  ABG    Component Value Date/Time   PHART 7.436 01/19/2023 0306   PCO2ART 46.8 01/19/2023 0306   PO2ART 167 (H) 01/19/2023 0306   HCO3 31.7 (H) 01/19/2023 0306   TCO2 33 (H) 01/19/2023 0306   O2SAT 100 01/19/2023 0306     Coagulation Profile: Recent Labs  Lab 01/17/23 2335  INR 1.3*     Cardiac Enzymes: No results for input(s): "CKTOTAL", "CKMB", "CKMBINDEX", "TROPONINI" in the last 168 hours.  HbA1C: Hgb A1c MFr Bld  Date/Time Value Ref Range Status  12/29/2022 12:07 AM 9.2 (H) 4.8 - 5.6 % Final    Comment:    (NOTE)         Prediabetes: 5.7 - 6.4         Diabetes: >6.4         Glycemic control for adults with diabetes: <7.0   06/29/2022 06:33 AM 7.5 (H) 4.8 - 5.6 % Final    Comment:    (NOTE) Pre diabetes:          5.7%-6.4%  Diabetes:              >6.4%  Glycemic control for   <7.0% adults with diabetes     CBG: Recent Labs  Lab 01/19/23 1535 01/19/23 1925 01/19/23 2322 01/20/23 0331 01/20/23 0755  GLUCAP 212* 183* 202* 183* 181*    CRITICAL CARE Doran Stabler, DO Internal Medicine Residency My pager: [phone]

## 2023-01-21 ENCOUNTER — Inpatient Hospital Stay (HOSPITAL_COMMUNITY): Payer: No Typology Code available for payment source | Attending: Pulmonary Disease

## 2023-01-21 DIAGNOSIS — R569 Unspecified convulsions: Secondary | ICD-10-CM

## 2023-01-21 DIAGNOSIS — A419 Sepsis, unspecified organism: Secondary | ICD-10-CM | POA: Diagnosis not present

## 2023-01-21 LAB — CULTURE, RESPIRATORY W GRAM STAIN

## 2023-01-21 LAB — GLUCOSE, CAPILLARY
Glucose-Capillary: 183 mg/dL — ABNORMAL HIGH (ref 70–99)
Glucose-Capillary: 196 mg/dL — ABNORMAL HIGH (ref 70–99)
Glucose-Capillary: 175 mg/dL — ABNORMAL HIGH (ref 70–99)
Glucose-Capillary: 190 mg/dL — ABNORMAL HIGH (ref 70–99)
Glucose-Capillary: 166 mg/dL — ABNORMAL HIGH (ref 70–99)
Glucose-Capillary: 153 mg/dL — ABNORMAL HIGH (ref 70–99)

## 2023-01-21 LAB — BASIC METABOLIC PANEL
Anion gap: 6 (ref 5–15)
BUN: 18 mg/dL (ref 8–23)
CO2: 28 mmol/L (ref 22–32)
Calcium: 8.5 mg/dL — ABNORMAL LOW (ref 8.9–10.3)
Chloride: 113 mmol/L — ABNORMAL HIGH (ref 98–111)
Creatinine, Ser: 0.34 mg/dL — ABNORMAL LOW (ref 0.61–1.24)
GFR, Estimated: >60 mL/min (ref >60)
Glucose, Bld: 202 mg/dL — ABNORMAL HIGH (ref 70–99)
Potassium: 3.9 mmol/L (ref 3.5–5.1)
Sodium: 147 mmol/L — ABNORMAL HIGH (ref 135–145)

## 2023-01-21 LAB — CBC
HCT: 34.6 % — ABNORMAL LOW (ref 39.0–52.0)
Hemoglobin: 10.2 g/dL — ABNORMAL LOW (ref 13.0–17.0)
MCH: 28.5 pg (ref 26.0–34.0)
MCHC: 29.5 g/dL — ABNORMAL LOW (ref 30.0–36.0)
MCV: 96.6 fL (ref 80.0–100.0)
Platelets: 232 10*3/uL (ref 150–400)
RBC: 3.58 MIL/uL — ABNORMAL LOW (ref 4.22–5.81)
RDW: 16.9 % — ABNORMAL HIGH (ref 11.5–15.5)
WBC: 7.6 10*3/uL (ref 4.0–10.5)
nRBC: 0.3 % — ABNORMAL HIGH (ref 0.0–0.2)

## 2023-01-21 MED ORDER — INSULIN GLARGINE-YFGN 100 UNIT/ML ~~LOC~~ SOLN
5.0000 [IU] | Freq: Once | SUBCUTANEOUS | Status: AC
  Administered 2023-01-21: 5 [IU] via SUBCUTANEOUS
  Filled 2023-01-21: qty 0.05

## 2023-01-21 MED ORDER — SODIUM CHLORIDE 0.9 % IV SOLN
2.0000 g | Freq: Three times a day (TID) | INTRAVENOUS | Status: DC
  Filled 2023-01-21 (×2): qty 40

## 2023-01-21 MED ORDER — INSULIN GLARGINE-YFGN 100 UNIT/ML ~~LOC~~ SOLN
20.0000 [IU] | Freq: Every day | SUBCUTANEOUS | Status: DC
  Administered 2023-01-22 – 2023-01-27 (×6): 20 [IU] via SUBCUTANEOUS
  Filled 2023-01-21 (×6): qty 0.2

## 2023-01-21 MED ORDER — AMANTADINE HCL 50 MG/5ML PO SOLN
100.0000 mg | Freq: Every day | ORAL | Status: DC
  Administered 2023-01-21 – 2023-01-27 (×7): 100 mg
  Filled 2023-01-21 (×7): qty 10

## 2023-01-21 MED ORDER — SODIUM CHLORIDE 0.9 % IV SOLN
1.0000 g | Freq: Three times a day (TID) | INTRAVENOUS | Status: AC
  Administered 2023-01-21 – 2023-01-24 (×11): 1 g via INTRAVENOUS
  Filled 2023-01-21 (×11): qty 20

## 2023-01-21 MED ORDER — FREE WATER
200.0000 mL | Status: DC
  Administered 2023-01-21 – 2023-01-23 (×15): 200 mL

## 2023-01-21 NOTE — Progress Notes (Signed)
Pharmacy Antibiotic Note  Joshua Haley is a 71 y.o. male admitted on 01/17/2023 with pneumonia.  Pharmacy has been consulted for meropenem dosing.  SCr 0.34 and improving. UOP 1.2 L in 24 hours. WBC 7.6 overall trending down and afebrile today. Imaging from 1/21 shows mild left basilar atelectasis and/or infiltrate.   Plan: Increase meropenem to 2g IV q8h  Follow cultures and signs/symptoms of infection  Height: 5\' 11"  (180.3 cm) Weight: 79.9 kg (176 lb 2.4 oz) IBW/kg (Calculated) : 75.3  Temp (24hrs), Avg:98.2 F (36.8 C), Min:97.8 F (36.6 C), Max:98.6 F (37 C)  Recent Labs  Lab 01/17/23 2335 01/18/23 0236 01/18/23 0403 01/18/23 1602 01/18/23 1747 01/18/23 2000 01/19/23 0317 01/20/23 0349 01/21/23 0404  WBC 12.7*  --  10.7*  --   --   --  8.2 6.2 7.6  CREATININE 0.98  --  0.82  --  0.50*  --  0.48* 0.44* 0.34*  LATICACIDVEN 3.1* 3.2*  --  1.9  --  1.7  --   --   --     Estimated Creatinine Clearance: 90.2 mL/min (A) (by C-G formula based on SCr of 0.34 mg/dL (L)).    Allergies  Allergen Reactions   Morphine And Related    Penicillins Other (See Comments)    Has tolerated Cefepime and Ceftriaxone   Statins Nausea And Vomiting    Antimicrobials this admission: Mero 1/18>> - started at [place] 1/22>>1/23  Dose adjustments this admission: 1/25 increase meropenem   Microbiology results: 1/21 BCx: no growth x3 days 1/21 UCx: yeast  1/22 Trach aspirate: few serratia and pseudomonas  1/22 MRSA PCR: negative 1/21 RVP: negative  Thank you for allowing pharmacy to be a part of this patient's care.  Jeneen Rinks 2/69/4854 6:27 AM

## 2023-01-21 NOTE — Inpatient Diabetes Management (Signed)
Inpatient Diabetes Program Recommendations  AACE/ADA: New Consensus Statement on Inpatient Glycemic Control   Target Ranges:  Prepandial:   less than 140 mg/dL      Peak postprandial:   less than 180 mg/dL (1-2 hours)      Critically ill patients:  140 - 180 mg/dL    Latest Reference Range & Units 01/21/23 03:23 01/21/23 07:37  Glucose-Capillary 70 - 99 mg/dL 183 (H) 196 (H)    Latest Reference Range & Units 01/20/23 07:55 01/20/23 11:35 01/20/23 16:05 01/20/23 19:26 01/20/23 23:30  Glucose-Capillary 70 - 99 mg/dL 181 (H) 252 (H) 283 (H) 221 (H) 217 (H)   Review of Glycemic Control  Diabetes history: DM2 Outpatient Diabetes medications: Lantus 30 units daily, Novolog 0-21 units QID Current orders for Inpatient glycemic control: Semglee 15 units daily, Novolog 0-15 units Q4H; Glucerna @ 60 ml/hr  Inpatient Diabetes Program Recommendations:    Insulin:  Please consider ordering Novolog 3 units Q4H for tube feeding coverage. If tube feeding is stopped or held then Novolog tube feeding coverage should also be stopped or held.  Thanks, Barnie Alderman, RN, MSN, [place] Diabetes Coordinator Inpatient Diabetes Program [phone] (Team Pager from 8am to [place])

## 2023-01-21 NOTE — Procedures (Signed)
Patient Name: Joshua Haley  MRN: [id number]  Epilepsy Attending: Lora Havens  Referring Physician/Provider: Gaylan Gerold, DO  Date: 01/21/2023 Duration: 22.06 mins  Patient history: 72yo M with seizure post TBI, now with ams. EEG to evaluate for seizure.  Level of alertness: Awake  AEDs during EEG study: None  Technical aspects: This EEG study was done with scalp electrodes positioned according to the 10-20 International system of electrode placement. Electrical activity was reviewed with band pass filter of 1-70Hz , sensitivity of 7 uV/mm, display speed of 83mm/sec with a 60Hz  notched filter applied as appropriate. EEG data were recorded continuously and digitally stored.  Video monitoring was available and reviewed as appropriate.  Description:  No posterior dominant rhythm was seen. EEG showed continuous generalized and maximal right temporal region 3 to 6 Hz theta-delta slowing. Hyperventilation and photic stimulation were not performed.      ABNORMALITY - Continuous slow, generalized and maximal right temporal region   IMPRESSION: This study is suggestive of cortical dysfunction arising from right temporal region likely secondary to underlying structural abnormality.  Additionally there is moderate to severe diffuse encephalopathy, nonspecific etiology. No seizures or definite epileptiform discharges were seen throughout the recording.   Jaretzi Droz Barbra Sarks

## 2023-01-21 NOTE — Progress Notes (Signed)
NAME:  Joshua Haley, MRN:  [id number], DOB:  1951/04/11, LOS: 3 ADMISSION DATE:  01/17/2023, CONSULTATION DATE:  01/18/23 REFERRING MD:  Roxanne Mins, CHIEF COMPLAINT:  altered    History of Present Illness:  72 yo man with a hx of HTN, DM, CAD, TBI, chronic resp failure (vent dependent), here with AMS.  Started on Vanc for PNA about 3 days ago.  Tachycardic today.  Baseline mental status: alert but non verbal.  Sacral decubitus ulcer.  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 Coli UTI and bilateral pneumonia on 12/28/2022.  Initially febrile and less responsive at [facility] and requiring increased O2 on trach collar.   Significant Hospital Events: Including procedures, antibiotic start and stop dates in addition to other pertinent events   1/22 admit to ICU septic shock PNA, looks very dehydrated as well   Interim History / Subjective:  Patient is able to open his eyes but does not track.  He can follow intermittent commands.  Objective   Blood pressure 98/61, pulse 75, temperature 98.1 F (36.7 C), temperature source Axillary, resp. rate 18, height 5\' 11"  (1.803 m), weight 79.9 kg, SpO2 99 %.    Vent Mode: PRVC FiO2 (%):  [40 %] 40 % Set Rate:  [15 bmp-18 bmp] 15 bmp Vt Set:  [500 mL] 500 mL PEEP:  [5 cmH20] 5 cmH20 Plateau Pressure:  [13 cmH20-18 cmH20] 15 cmH20   Intake/Output Summary (Last 24 hours) at 01/21/2023 0849 Last data filed at 01/21/2023 0800 Gross per 24 hour  Intake 2360.43 ml  Output 1201 ml  Net 1159.43 ml    Filed Weights   01/17/23 2334 01/19/23 0310 01/20/23 0356  Weight: 83.5 kg 79.9 kg 79.9  kg    Examination: General: Chronically ill trach vent.   HENT: Tracheostomy in place Lungs: Coarse. No wheezing Cardiovascular: rrr Abdomen: + PEG, soft  Extremities: Symmetrical muscle wasting. Trace edema of bilat LE Neuro: Not opening eyes   Resolved Hospital Problem list     Assessment & Plan:  Septic shock, unclear etiology at this time (PNA, UTI, bacteremia with PICC  Possibly hypovolemic shock superimposed  The most likely source is left basilar pneumonia.  Blood culture negative to date.  MRSA negative.  Respiratory culture grew Serratia and Pseudomonas. -Continue meropenem for now.  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  Wean as able. -Continue IV antibiotics as above  Acute metabolic/infectious encephalopathy History of post TBI seizure Baseline is alert, awake, track and able to use his right side extremities.   Multifactorial in the setting of infection and hypernatremia.  No reported seizure activity.  Patient is able to open his eyes but is not tracking.  Follow intermittent commands.  Since his mentation fails to improve after 7 days of IV antibiotic, will get an ABG and EEG.  If unremarkable will obtain an MRI of the brain to rule out CVA in the setting of hypoperfusion from shock. -Continue IV  antibiotic as above -Keppra was on the discharge summary on his last admission on 1/1.  We had a notes from [facility] on 1/16 that said to continue prophylactic seizure medication.  No indication to resume Keppra at this time.    Hypernatremia -Free water flushes 200 cc every 3 hours  Hypothyroidism  Not sure why he is on both T3 and T4 supplementation at home. TSH and T4 WNL.  -Continue home levothyroxine and liothyronine  CAD status post CABG Holding Lasix and  metoprolol in the setting of septic shock  Chronic malnutrition -Continue tube feed  T2DM -added long acting insulin 10 u  Update wife via phone  Best Practice (right click and "Reselect all SmartList Selections" daily)   Diet/type: tubefeeds DVT prophylaxis: systemic dose LMWH GI prophylaxis: H2B Lines: N/A picc Foley:  Yes Code Status:  DNR Last date of multidisciplinary goals of care discussion [NA]  Labs   CBC: Recent Labs  Lab 01/17/23 2335 01/18/23 0027 01/18/23 0403 01/19/23 0306 01/19/23 0317 01/20/23 0349 01/21/23 0404  WBC 12.7*  --  10.7*  --  8.2 6.2 7.6  NEUTROABS 9.5*  --   --   --   --   --   --   HGB 16.0   < > 12.2* 11.6* 11.3* 10.4* 10.2*  HCT 54.9*   < > 41.5 34.0* 39.0 34.9* 34.6*  MCV 99.1  --  100.0  --  98.5 98.3 96.6  PLT 344  --  202  --  187 187 232   < > = values in this interval not displayed.     Basic Metabolic Panel: Recent Labs  Lab 01/18/23 0403 01/18/23 1139 01/18/23 1747 01/19/23 0306 01/19/23 0317 01/19/23 1330 01/19/23 1700 01/20/23 0349 01/21/23 0404  NA 157*  --  154* 160* 153*  --   --  153* 147*  K 3.1*  --  2.8* 3.1* 3.0*  --   --  3.7 3.9  CL 118*  --  118*  --  117*  --   --  119* 113*  CO2 32  --  31  --  29  --   --  29 28  GLUCOSE 363*  --  213*  --  193*  --   --  208* 202*  BUN 66*  --  45*  --  45*  --   --  32* 18  CREATININE 0.82  --  0.50*  --  0.48*  --   --  0.44* 0.34*  CALCIUM 8.8*  --  8.7*  --  8.7*  --   --  8.6* 8.5*  MG  --    < > 1.8  --  2.2 2.1 1.9 2.4  --   PHOS  --    < > 1.8*  --  3.2 2.4* 4.1 1.9*  --    < > = values in this interval not displayed.    GFR: Estimated Creatinine Clearance: 90.2 mL/min (A) (by C-G formula based on SCr of 0.34 mg/dL (L)). Recent Labs  Lab 01/17/23 2335 01/18/23 0236 01/18/23 0403 01/18/23 1139 01/18/23 1602 01/18/23 2000 01/19/23 0317 01/20/23 0349 01/21/23 0404  PROCALCITON  --   --   --  0.66  --   --  0.58  --   --   WBC 12.7*  --  10.7*   --   --   --  8.2 6.2 7.6  LATICACIDVEN 3.1* 3.2*  --   --  1.9 1.7  --   --   --  Liver Function Tests: Recent Labs  Lab 01/17/23 2335  AST 97*  ALT 156*  ALKPHOS 131*  BILITOT 0.5  PROT 8.1  ALBUMIN 2.4*    No results for input(s): "LIPASE", "AMYLASE" in the last 168 hours. Recent Labs  Lab 01/20/23 1147  AMMONIA 31    ABG    Component Value Date/Time   PHART 7.436 01/19/2023 0306   PCO2ART 46.8 01/19/2023 0306   PO2ART 167 (H) 01/19/2023 0306   HCO3 31.7 (H) 01/19/2023 0306   TCO2 33 (H) 01/19/2023 0306   O2SAT 100 01/19/2023 0306     Coagulation Profile: Recent Labs  Lab 01/17/23 2335  INR 1.3*     Cardiac Enzymes: No results for input(s): "CKTOTAL", "CKMB", "CKMBINDEX", "TROPONINI" in the last 168 hours.  HbA1C: Hgb A1c MFr Bld  Date/Time Value Ref Range Status  12/29/2022 12:07 AM 9.2 (H) 4.8 - 5.6 % Final    Comment:    (NOTE)         Prediabetes: 5.7 - 6.4         Diabetes: >6.4         Glycemic control for adults with diabetes: <7.0   06/29/2022 06:33 AM 7.5 (H) 4.8 - 5.6 % Final    Comment:    (NOTE) Pre diabetes:          5.7%-6.4%  Diabetes:              >6.4%  Glycemic control for   <7.0% adults with diabetes     CBG: Recent Labs  Lab 01/20/23 1605 01/20/23 1926 01/20/23 2330 01/21/23 0323 01/21/23 [place] Gaylan Gerold, DO Internal Medicine Residency My pager: [phone]

## 2023-01-21 NOTE — Consult Note (Signed)
[facility] for Infectious Disease    Date of Admission:  01/17/2023     Total days of antibiotics 3   Merrem 1/21 >> c  Vancomycin 1/21 >>          Reason for Consult: Fever/sepsis    Referring Provider: Lake Bells Primary Care Provider: Townsend Roger, MD   Assessment: Joshua Haley is a 72 y.o. male admitted to [facility] on 1/21 from [facility] after no improvement and persistent fevers on Merrem + Vancomycin x 3d. HIgh fever > 105 F on admission but no fevers since ICU care and leukocytosis has normalized. His vent settings have also appeared to decrease and weaning today. Still with persistent encephalopathy but thought to be more from severe sepsis with underlying brain insult/TBI and slow to recover.   Unclear as to the true source of his fevers/sepsis as he has multiple reasons for this. Not bacteremic here (though on abx prior to). Just D/C'd from hospital on 1/3 with 7d course vanc/merrem for PNA tx and abnormal CXR then.  Currently, PCCM team most suspicious for PNA again given acute change in vent settings. Pseudmonoas from trach aspirate is carbapenem resistant - given he has improved on merrem, seems to support colonizer and not active pathogen at this time. The serratia is sensitive to ertapenem so may be hitting this for treatment.  Other consideration would be complicated UTI - urine was sterile for bacteria on admission but may have been the case with 3d of appropriately treating Abx (has h/o ESBL producing urine organisms).   Given improvement on carbapenem, would continue for 10d course of tx (counting what he had PTA) to cover both concern for serratia PNA or complicated ESBL UTI. EOT 01-24-23.   Please call with any questions or changes in his condition.  If deteriorates from respiratory stand point - would switch to cefepime. D/C when off pressors durably per PCCM's recommendations back to [facility].    Plan: Continue merrem through 1/28 for 10-d course       Principal Problem:   Sepsis due to undetermined organism ([place])    amantadine  100 mg Per Tube Daily   Chlorhexidine Gluconate Cloth  6 each Topical Daily   enoxaparin (LOVENOX) injection  40 mg Subcutaneous Q24H   famotidine  20 mg Per Tube BID   free water  200 mL Per Tube Q3H   insulin aspart  0-15 Units Subcutaneous Q4H   [START ON 01/22/2023] insulin glargine-yfgn  20 Units Subcutaneous Daily   levothyroxine  100 mcg Per Tube Q0600   liothyronine  7.5 mcg Per Tube Q12H   midodrine  10 mg Per NG tube TID WC   modafinil  100 mg Per Tube Daily   mouth rinse  15 mL Mouth Rinse Q2H   sertraline  25 mg Per Tube Daily   sodium chloride flush  10-40 mL Intracatheter Q12H    HPI: Joshua Haley is a 72 y.o. male admitted from [facility] on 01/17/23 for altered mental status, Sepsis syndrome.   PMHx HTN, DM, CAD, TBI, Chronic trach with vent dependence.   Presented after 3d history of being antibiotics (chart reflects Vanc / merrem?) Tachycardic (+) SIRS, with concern for L basilar infiltrate on CXR with high fevers. Admitted with TMax 105.6 F and requiring vasopressors throughout stay, now hospital day 5 and trying to wean off. WBC normalized. Afebrile since 1/22.   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 Allergies  Allergen Reactions   Morphine And Related    Penicillins Other (See Comments)    Has tolerated Cefepime and Ceftriaxone   Statins Nausea And Vomiting    OBJECTIVE: Blood pressure 106/67, pulse 76, temperature 98.1 F (36.7 C), temperature source Axillary, resp. rate 18, height 5\' 11"  (1.803 m), weight 79.9 kg, SpO2 99 %.  Physical Exam Constitutional:      General: He is not in acute distress.    Appearance: He is ill-appearing. He is not toxic-appearing.  HENT:     Mouth/Throat:     Mouth: Mucous membranes are moist.     Pharynx: No oropharyngeal exudate.  Eyes:     Conjunctiva/sclera: Conjunctivae normal.  Cardiovascular:     Rate and Rhythm: Normal rate and regular rhythm.  Pulmonary:     Effort: Pulmonary effort is normal. No respiratory distress.     Breath sounds: No rhonchi.  Skin:    General: Skin is warm and dry.     Findings: No rash.     Lab Results Lab Results  Component Value Date   WBC 7.6 01/21/2023   HGB 10.2 (L) 01/21/2023   HCT 34.6 (L) 01/21/2023   MCV 96.6 01/21/2023   PLT 232 01/21/2023    Lab Results  Component Value Date   CREATININE 0.34 (L) 01/21/2023   BUN 18 01/21/2023   NA 147 (H) 01/21/2023   K 3.9 01/21/2023   CL 113 (H) 01/21/2023   CO2 28 01/21/2023    Lab Results  Component Value Date   ALT 156 (H) 01/17/2023   AST 97 (H) 01/17/2023   ALKPHOS 131 (H) 01/17/2023   BILITOT 0.5 01/17/2023     Microbiology: Recent Results (from the past 240 hour(s))  Resp panel by RT-PCR (RSV, Flu A&B, Covid) Anterior Nasal Swab     Status: None   Collection Time: 01/17/23 11:41 PM   Specimen: Anterior Nasal Swab  Result Value Ref Range Status   SARS Coronavirus 2 by RT PCR NEGATIVE NEGATIVE Final    Comment: (NOTE) SARS-CoV-2 target nucleic acids are NOT DETECTED.  The SARS-CoV-2 RNA is generally detectable in upper respiratory specimens during the acute phase of infection. The lowest concentration of  SARS-CoV-2 viral copies this assay can detect is 138 copies/mL. A negative result does not preclude SARS-Cov-2 infection and should not be used as the sole basis for treatment or other patient management decisions. A negative result may occur with  improper specimen collection/handling, submission of specimen other than nasopharyngeal swab, presence of viral mutation(s) within the areas targeted by this assay, and inadequate number of viral copies(<138 copies/mL). A negative result must be combined with clinical observations, patient history, and epidemiological information. The expected  result is Negative.  Fact Sheet for Patients:  BloggerCourse.com  Fact Sheet for Healthcare Providers:  SeriousBroker.it  This test is no t yet approved or cleared by the [place] FDA and  has been authorized for detection and/or diagnosis of SARS-CoV-2 by FDA under an Emergency Use Authorization (EUA). This EUA will remain  in effect (meaning this test can be used) for the duration of the COVID-19 declaration under Section 564(b)(1) of the Act, 21 U.S.C.section 360bbb-3(b)(1), unless the authorization is terminated  or revoked sooner.       Influenza A by PCR NEGATIVE NEGATIVE Final   Influenza B by PCR NEGATIVE NEGATIVE Final    Comment: (NOTE) The Xpert Xpress SARS-CoV-2/FLU/RSV plus assay is intended as an aid in the diagnosis of influenza from Nasopharyngeal swab specimens and should not be used as a sole basis for treatment. Nasal washings and aspirates are unacceptable for Xpert Xpress SARS-CoV-2/FLU/RSV testing.  Fact Sheet for Patients: BloggerCourse.com  Fact Sheet for Healthcare Providers: SeriousBroker.it  This test is not yet approved or cleared by the [place] FDA and has been authorized for detection and/or diagnosis of SARS-CoV-2 by FDA under an Emergency Use  Authorization (EUA). This EUA will remain in effect (meaning this test can be used) for the duration of the COVID-19 declaration under Section 564(b)(1) of the Act, 21 U.S.C. section 360bbb-3(b)(1), unless the authorization is terminated or revoked.     Resp Syncytial Virus by PCR NEGATIVE NEGATIVE Final    Comment: (NOTE) Fact Sheet for Patients: BloggerCourse.com  Fact Sheet for Healthcare Providers: SeriousBroker.it  This test is not yet approved or cleared by the [place] FDA and has been authorized for detection and/or diagnosis of SARS-CoV-2 by FDA under an Emergency Use Authorization (EUA). This EUA will remain in effect (meaning this test can be used) for the duration of the COVID-19 declaration under Section 564(b)(1) of the Act, 21 U.S.C. section 360bbb-3(b)(1), unless the authorization is terminated or revoked.  Performed at [facility], [address] [place], [place] [address]   Blood Culture (routine x 2)     Status: None (Preliminary result)   Collection Time: 01/17/23 11:41 PM   Specimen: BLOOD LEFT FOREARM  Result Value Ref Range Status   Specimen Description BLOOD LEFT FOREARM  Final   Special Requests   Final    BOTTLES DRAWN AEROBIC AND ANAEROBIC Blood Culture results may not be optimal due to an inadequate volume of blood received in culture bottles   Culture   Final    NO GROWTH 3 DAYS Performed at [facility], [address] [place], [place] [address]    Report Status PENDING  Incomplete  Urine Culture     Status: Abnormal   Collection Time: 01/17/23 11:41 PM   Specimen: In/Out Cath Urine  Result Value Ref Range Status   Specimen Description IN/OUT CATH URINE  Final   Special Requests   Final    NONE Performed at [facility], [address], [place], [place] [address]    Culture >=100,000 COLONIES/mL YEAST (A)  Final   Report Status 01/19/2023 FINAL  Final  Blood  Culture (routine x 2)     Status: None (Preliminary result)   Collection Time: 01/17/23 11:46 PM   Specimen: BLOOD  Result Value Ref Range Status   Specimen Description BLOOD LEFT ANTECUBITAL  Final   Special Requests   Final    BOTTLES DRAWN AEROBIC AND ANAEROBIC Blood Culture results  may not be optimal due to an inadequate volume of blood received in culture bottles   Culture   Final    NO GROWTH 3 DAYS Performed at [facility], [place] [address], [place], [place] [address]    Report Status PENDING  Incomplete  Culture, Respiratory w Gram Stain     Status: None   Collection Time: 01/18/23  9:00 AM   Specimen: Tracheal Aspirate; Respiratory  Result Value Ref Range Status   Specimen Description TRACHEAL ASPIRATE  Final   Special Requests NONE  Final   Gram Stain   Final    MODERATE WBC PRESENT,BOTH PMN AND MONONUCLEAR RARE YEAST WITH PSEUDOHYPHAE    Culture   Final    FEW SERRATIA MARCESCENS FEW PSEUDOMONAS AERUGINOSA    Report Status 01/21/2023 FINAL  Final   Organism ID, Bacteria SERRATIA MARCESCENS  Final   Organism ID, Bacteria PSEUDOMONAS AERUGINOSA  Final      Susceptibility   Pseudomonas aeruginosa - MIC*    CEFTAZIDIME 2 SENSITIVE Sensitive     CIPROFLOXACIN <=0.25 SENSITIVE Sensitive     GENTAMICIN <=1 SENSITIVE Sensitive     IMIPENEM >=16 RESISTANT Resistant     PIP/TAZO 16 SENSITIVE Sensitive     CEFEPIME 2 SENSITIVE Sensitive     * FEW PSEUDOMONAS AERUGINOSA   Serratia marcescens - MIC*    CEFAZOLIN >=64 RESISTANT Resistant     CEFEPIME <=0.12 SENSITIVE Sensitive     CEFTAZIDIME <=1 SENSITIVE Sensitive     CEFTRIAXONE <=0.25 SENSITIVE Sensitive     CIPROFLOXACIN 0.5 INTERMEDIATE Intermediate     GENTAMICIN <=1 SENSITIVE Sensitive     TRIMETH/SULFA <=20 SENSITIVE Sensitive     ERTAPENEM Value in next row Sensitive      SENSITIVE<=0.12Performed at [facility], [place] [address], [place], [place] [address]    * FEW SERRATIA MARCESCENS  MRSA Next Gen  by PCR, Nasal     Status: None   Collection Time: 01/18/23  9:53 PM   Specimen: Nasal Mucosa; Nasal Swab  Result Value Ref Range Status   MRSA by PCR Next Gen NOT DETECTED NOT DETECTED Final    Comment: (NOTE) The GeneXpert MRSA Assay (FDA approved for NASAL specimens only), is one component of a comprehensive MRSA colonization surveillance program. It is not intended to diagnose MRSA infection nor to guide or monitor treatment for MRSA infections. Test performance is not FDA approved in patients less than 13 years old. Performed at [facility], [place] [address], [place],  [address]      Janene Madeira, MSN, NP-C [facility] Infectious Disease [email or web address] Pager: [phone] Office: [phone] [facility] Main Line: [place] Communication Welcome

## 2023-01-21 NOTE — Progress Notes (Signed)
I talked to patient's sister at bedside this morning. She said that over the last few weeks, patient has not been very interactive and barely opened his eyes.  She was aware of patient's overall poor prognosis but has not had a [place] conversation.  I called and spoke to this patient's wife, [facility], on the phone.  I updated her on his progress. We talked about patient's overall poor prognosis and quality of life.  She stated that patient was trach dependent since December 2022.  They only discussed briefly about his poor quality of life being ventilator dependent in the beginning.  The decision for trach and PEG was made by family because patient was in a coma.  She stated that patient did not discuss with her about his wishes regarding life support before the event.  I explained to her that this may be his new baseline.  He will continue to decline with his repeated infections.  I encouraged family to have a conversation about his quality of life and his wishes.  She stated that she will talk to her family about it.  All questions was answered.

## 2023-01-21 NOTE — Progress Notes (Signed)
EEG complete - results pending 

## 2023-01-22 ENCOUNTER — Inpatient Hospital Stay (HOSPITAL_COMMUNITY): Payer: No Typology Code available for payment source

## 2023-01-22 DIAGNOSIS — J189 Pneumonia, unspecified organism: Secondary | ICD-10-CM | POA: Diagnosis not present

## 2023-01-22 DIAGNOSIS — Y95 Nosocomial condition: Secondary | ICD-10-CM | POA: Diagnosis not present

## 2023-01-22 LAB — BASIC METABOLIC PANEL
Anion gap: 5 (ref 5–15)
BUN: 14 mg/dL (ref 8–23)
CO2: 26 mmol/L (ref 22–32)
Calcium: 8.8 mg/dL — ABNORMAL LOW (ref 8.9–10.3)
Chloride: 111 mmol/L (ref 98–111)
Creatinine, Ser: 0.32 mg/dL — ABNORMAL LOW (ref 0.61–1.24)
GFR, Estimated: >60 mL/min (ref >60)
Glucose, Bld: 164 mg/dL — ABNORMAL HIGH (ref 70–99)
Potassium: 4.4 mmol/L (ref 3.5–5.1)
Sodium: 142 mmol/L (ref 135–145)

## 2023-01-22 LAB — CBC
HCT: 36.4 % — ABNORMAL LOW (ref 39.0–52.0)
Hemoglobin: 11.2 g/dL — ABNORMAL LOW (ref 13.0–17.0)
MCH: 29.1 pg (ref 26.0–34.0)
MCHC: 30.8 g/dL (ref 30.0–36.0)
MCV: 94.5 fL (ref 80.0–100.0)
Platelets: 202 10*3/uL (ref 150–400)
RBC: 3.85 MIL/uL — ABNORMAL LOW (ref 4.22–5.81)
RDW: 16.9 % — ABNORMAL HIGH (ref 11.5–15.5)
WBC: 7.7 10*3/uL (ref 4.0–10.5)
nRBC: 0.3 % — ABNORMAL HIGH (ref 0.0–0.2)

## 2023-01-22 LAB — POCT I-STAT 7, (LYTES, BLD GAS, ICA,H+H)
Acid-Base Excess: 3 mmol/L — ABNORMAL HIGH (ref 0.0–2.0)
Bicarbonate: 26.7 mmol/L (ref 20.0–28.0)
Calcium, Ion: 1.33 mmol/L (ref 1.15–1.40)
HCT: 30 % — ABNORMAL LOW (ref 39.0–52.0)
Hemoglobin: 10.2 g/dL — ABNORMAL LOW (ref 13.0–17.0)
O2 Saturation: 97 %
Patient temperature: 98.1
Potassium: 3.9 mmol/L (ref 3.5–5.1)
Sodium: 147 mmol/L — ABNORMAL HIGH (ref 135–145)
TCO2: 28 mmol/L (ref 22–32)
pCO2 arterial: 37.9 mmHg (ref 32–48)
pH, Arterial: 7.455 — ABNORMAL HIGH (ref 7.35–7.45)
pO2, Arterial: 87 mmHg (ref 83–108)

## 2023-01-22 LAB — GLUCOSE, CAPILLARY
Glucose-Capillary: 152 mg/dL — ABNORMAL HIGH (ref 70–99)
Glucose-Capillary: 146 mg/dL — ABNORMAL HIGH (ref 70–99)
Glucose-Capillary: 134 mg/dL — ABNORMAL HIGH (ref 70–99)
Glucose-Capillary: 145 mg/dL — ABNORMAL HIGH (ref 70–99)
Glucose-Capillary: 153 mg/dL — ABNORMAL HIGH (ref 70–99)
Glucose-Capillary: 159 mg/dL — ABNORMAL HIGH (ref 70–99)

## 2023-01-22 MED ORDER — FERROUS SULFATE 300 (60 FE) MG/5ML PO SOLN
300.0000 mg | Freq: Two times a day (BID) | ORAL | Status: DC
  Administered 2023-01-22 – 2023-01-27 (×11): 300 mg
  Filled 2023-01-22 (×11): qty 5

## 2023-01-22 MED ORDER — SODIUM CHLORIDE 0.9 % IV SOLN: INTRAVENOUS | Status: DC | PRN

## 2023-01-22 MED ORDER — MIDAZOLAM HCL 2 MG/2ML IJ SOLN
1.0000 mg | INTRAMUSCULAR | Status: DC | PRN
  Filled 2023-01-22: qty 2

## 2023-01-22 MED ORDER — SODIUM CHLORIDE 0.9% FLUSH
3.0000 mL | Freq: Two times a day (BID) | INTRAVENOUS | Status: DC
  Administered 2023-01-23 – 2023-01-27 (×8): 3 mL via INTRAVENOUS

## 2023-01-22 MED ORDER — FUROSEMIDE 40 MG PO TABS
20.0000 mg | ORAL_TABLET | Freq: Every day | ORAL | Status: DC
  Administered 2023-01-22: 20 mg
  Filled 2023-01-22: qty 1

## 2023-01-22 MED ORDER — SODIUM CHLORIDE 0.9% FLUSH
3.0000 mL | INTRAVENOUS | Status: DC | PRN
  Administered 2023-01-22 (×2): 3 mL via INTRAVENOUS

## 2023-01-22 MED ORDER — BANATROL TF EN LIQD
60.0000 mL | Freq: Two times a day (BID) | ENTERAL | Status: DC
  Administered 2023-01-22 – 2023-01-27 (×11): 60 mL
  Filled 2023-01-22 (×10): qty 60

## 2023-01-22 NOTE — Progress Notes (Addendum)
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  Started on Vanc for PNA about 3 days ago.  Tachycardic today.  Baseline mental status: alert but non verbal.  Sacral decubitus ulcer.  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 Coli UTI and bilateral pneumonia on 12/28/2022.  Initially febrile and less responsive at [facility] and requiring increased O2 on trach collar.   Significant Hospital Events: Including procedures, antibiotic start and stop dates in addition to other pertinent events   1/22 admit to ICU septic shock PNA, looks very dehydrated as well   Interim History / Subjective:  Patient is awake and alert and track.  Follow intermittent commands.  Does not appear in acute distress.  Objective   Blood pressure (!) 82/58, pulse 69, temperature 98.6 F (37 C), temperature source Axillary, resp. rate 19, height 5\' 11"  (1.803 m), weight 83.1 kg, SpO2 100 %.    Vent Mode: CPAP;PSV FiO2 (%):  [40 %] 40 % Set Rate:  [15 bmp] 15 bmp Vt Set:  [500 mL] 500 mL PEEP:  [5 cmH20] 5 cmH20 Pressure Support:  [8 cmH20] 8 cmH20 Plateau Pressure:  [13 cmH20-15 cmH20] 14 cmH20   Intake/Output Summary (Last 24 hours) at 01/22/2023 0917 Last data filed at 01/22/2023 0800 Gross per 24 hour  Intake 1807.16 ml  Output 1780 ml  Net 27.16 ml    Filed Weights    01/19/23 0310 01/20/23 0356 01/22/23 0325  Weight: 79.9 kg 79.9 kg 83.1 kg    Examination: General: Chronically ill trach vent.   HENT: Tracheostomy in place Lungs: Coarse. No wheezing Cardiovascular: rrr Abdomen: + PEG, soft  Extremities: Symmetrical muscle wasting. Trace edema of bilat LE Neuro: Not opening eyes  Blood culture: - 3 days UA: No pyuria Urine culture: > 100k Yeast MRSA: Negative Respiratory culture: Pseudomonas resistant to pending.  Serratia resistant to cefazolin and ciprofloxacin.  Resolved Hospital Problem list     Assessment & Plan:  Septic shock, unclear etiology at this time (PNA, UTI, bacteremia with PICC  Possibly hypovolemic shock superimposed  The most likely source is left basilar pneumonia.  Blood culture negative to date.  UC grew yeast. MRSA negative.  Respiratory culture grew Serratia and Pseudomonas resistant to penem. Discussed with ID yesterday.  They recommended finishing 10d of meropenem given his clinical improvement.  If his respiratory status worsens, can switch to cefepime but there is a risk of encephalopathy.  It seems like the Pseudomonas was colonized. -Continue meropenem to complete 10 days (1/18 - 1/28 ) -Continue midodrine 10 mg TID. He is off of levophed -Continue tube feed and free water  Acute on chronic respiratory failure with hypoxia  Trach dependent (baseline FiO2 28%) -Continue vent support.  Wean as able. Currently on weaning  -Continue IV antibiotics as above -Check CXR  Acute metabolic/infectious  encephalopathy History of post TBI seizure Baseline is alert, awake, track and able to use his right side extremities.   Multifactorial in the setting of infection and hypernatremia.  No reported seizure activity.  Patient seems to be back at baseline. -Continue IV antibiotic as above -Keppra was on the discharge summary on his last admission on 1/1.  We had a notes from [facility] on 1/16 that said to continue prophylactic  seizure medication.  No indication to resume Keppra at this time.    Hypernatremia -improving -Free water flushes 200 cc every 3 hours  Hypothyroidism  Not sure why he is on both T3 and T4 supplementation at home. TSH and T4 WNL.  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  I/O + 4.3L -Home lasix 20 mg BID. Resume Lasix 20 mg daily and watch BP closely.  -Hold metop for now  Chronic malnutrition -Continue tube feed  T2DM -Semglee 20u daily  GOC discussion with wife via phone yesterday  Best Practice (right click and "Reselect all SmartList Selections" daily)   Diet/type: tubefeeds DVT prophylaxis: systemic dose LMWH GI prophylaxis: H2B Lines: N/A picc removed yes Foley:  Yes Code Status:  DNR Last date of multidisciplinary goals of care discussion [1/25]  Labs   CBC: Recent Labs  Lab 01/17/23 2335 01/18/23 0027 01/18/23 0403 01/19/23 0306 01/19/23 0317 01/20/23 0349 01/21/23 0404 01/21/23 0956 01/22/23 0206  WBC 12.7*  --  10.7*  --  8.2 6.2 7.6  --  7.7  NEUTROABS 9.5*  --   --   --   --   --   --   --   --   HGB 16.0   < > 12.2*   < > 11.3* 10.4* 10.2* 10.2* 11.2*  HCT 54.9*   < > 41.5   < > 39.0 34.9* 34.6* 30.0* 36.4*  MCV 99.1  --  100.0  --  98.5 98.3 96.6  --  94.5  PLT 344  --  202  --  187 187 232  --  202   < > = values in this interval not displayed.     Basic Metabolic Panel: Recent Labs  Lab 01/18/23 1747 01/19/23 0306 01/19/23 0317 01/19/23 1330 01/19/23 1700 01/20/23 0349 01/21/23 0404 01/21/23 0956 01/22/23 0206  NA 154*   < > 153*  --   --  153* 147* 147* 142  K 2.8*   < > 3.0*  --   --  3.7 3.9 3.9 4.4  CL 118*  --  117*  --   --  119* 113*  --  111  CO2 31  --  29  --   --  29 28  --  26  GLUCOSE 213*  --  193*  --   --  208* 202*  --  164*  BUN 45*  --  45*  --   --  32* 18  --  14  CREATININE 0.50*  --   0.48*  --   --  0.44* 0.34*  --  0.32*  CALCIUM 8.7*  --  8.7*  --   --  8.6* 8.5*  --  8.8*  MG 1.8  --  2.2 2.1 1.9 2.4  --   --   --   PHOS 1.8*  --  3.2 2.4* 4.1 1.9*  --   --   --    < > =  values in this interval not displayed.    GFR: Estimated Creatinine Clearance: 90.2 mL/min (A) (by C-G formula based on SCr of 0.32 mg/dL (L)). Recent Labs  Lab 01/17/23 2335 01/18/23 0236 01/18/23 0403 01/18/23 1139 01/18/23 1602 01/18/23 2000 01/19/23 0317 01/20/23 0349 01/21/23 0404 01/22/23 0206  PROCALCITON  --   --   --  0.66  --   --  0.58  --   --   --   WBC 12.7*  --    < >  --   --   --  8.2 6.2 7.6 7.7  LATICACIDVEN 3.1* 3.2*  --   --  1.9 1.7  --   --   --   --    < > = values in this interval not displayed.     Liver Function Tests: Recent Labs  Lab 01/17/23 2335  AST 97*  ALT 156*  ALKPHOS 131*  BILITOT 0.5  PROT 8.1  ALBUMIN 2.4*    No results for input(s): "LIPASE", "AMYLASE" in the last 168 hours. Recent Labs  Lab 01/20/23 1147  AMMONIA 31     ABG    Component Value Date/Time   PHART 7.455 (H) 01/21/2023 0956   PCO2ART 37.9 01/21/2023 0956   PO2ART 87 01/21/2023 0956   HCO3 26.7 01/21/2023 0956   TCO2 28 01/21/2023 0956   O2SAT 97 01/21/2023 0956     Coagulation Profile: Recent Labs  Lab 01/17/23 2335  INR 1.3*     Cardiac Enzymes: No results for input(s): "CKTOTAL", "CKMB", "CKMBINDEX", "TROPONINI" in the last 168 hours.  HbA1C: Hgb A1c MFr Bld  Date/Time Value Ref Range Status  12/29/2022 12:07 AM 9.2 (H) 4.8 - 5.6 % Final    Comment:    (NOTE)         Prediabetes: 5.7 - 6.4         Diabetes: >6.4         Glycemic control for adults with diabetes: <7.0   06/29/2022 06:33 AM 7.5 (H) 4.8 - 5.6 % Final    Comment:    (NOTE) Pre diabetes:          5.7%-6.4%  Diabetes:              >6.4%  Glycemic control for   <7.0% adults with diabetes     CBG: Recent Labs  Lab 01/21/23 1551 01/21/23 1924 01/21/23 2329  01/22/23 0317 01/22/23 0745  GLUCAP 190* 166* 153* 152* Gerlach Gaylan Gerold, DO Internal Medicine Residency My pager: [phone]

## 2023-01-22 NOTE — TOC Progression Note (Signed)
Transition of Care [facility]) - Progression Note    Patient Details  Name: Joshua Haley MRN: [id number] Date of Birth: 11/05/1951  Transition of Care [facility]) CM/SW Contact  Sharin Mons, RN Phone Number: 01/22/2023, 3:26 PM  Clinical Narrative:    NCM spoke with  worker's comp [facility] @ [phone]. Malachy Mood provided NCM with [employer] ( insurance carrier) , Leata Mouse (claim adjuster), [phone], ext [phone], Claim# [id number] Billing : [employer] in c/o [employer], [place], [place], [address].  Per Malachy Mood requested clinicals sent by UR.  TOC team following and will assist with needs....  Expected Discharge Plan: [place] Barriers to Discharge: Continued Medical Work up  Expected Discharge Plan and [place] arrangements for the past 2 months: [place] ([facility])                                       Social Determinants of Health (SDOH) Interventions SDOH Screenings   Depression (PHQ2-9): Low Risk  (02/22/2020)  Tobacco Use: Medium Risk (12/12/2022)    Readmission Risk Interventions     No data to display

## 2023-01-22 NOTE — Progress Notes (Signed)
[facility] Progress Note Patient Name: Joshua Haley DOB: 24-Jan-1951 MRN: [id number]   Date of Service  01/22/2023  HPI/Events of Note  Patient had witnessed seizure-like activity with a gaze preference, per bedside RN, post-event he is less responsive than his cognitively impaired baseline. Spot EEG yesterday was abnormal but had no seizures on it.  eICU Interventions  PRN Versed ordered for recurrence, cEEG ordered, and neurology consulted.        Frederik Pear 01/22/2023, 11:32 PM

## 2023-01-22 NOTE — Consult Note (Signed)
Neurology Consultation  Reason for Consult: Seizure Referring Physician: Dr. Lucile Shutters, critical care  CC: Seizure  History is obtained from: Chart, patient's RN  HPI: Joshua Haley is a 72 y.o. 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  She said that he has been very less responsive, does not follow commands-that is not new but what was new was that she came into having his tube feeds and witnessed him to have left facial twitching followed by leftward gaze followed by whole body seizure-like activity that lasted a few minutes.  It resolved spontaneously.  CCM was notified who requested a neurological consultation.. Patient unable to provide any history at this time.  ROS: Unable to obtain due to altered mental status.   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    CAD (coronary artery disease)    Diabetes mellitus without complication ([place])    Hypertension    Hypothyroidism    Kidney stone    TBI (traumatic brain injury) ([place])    Tracheostomy in place [facility])    No family history on file.  Social History:   reports that he has quit smoking. He has never used smokeless tobacco. He reports that he does not currently use alcohol. No history on file for drug use.  Medications  Current Facility-Administered Medications:    0.9 %  sodium chloride infusion, , Intravenous, PRN, Collene Gobble, MD, Last Rate: 10 mL/hr at 01/22/23 2100, Infusion Verify at 01/22/23 2100   amantadine (SYMMETREL) 50 MG/5ML solution 100 mg, 100 mg, Per Tube, Daily, Gaylan Gerold, DO, 100 mg at 01/22/23 4193   Chlorhexidine Gluconate Cloth  2 % PADS 6 each, 6 each, Topical, Daily, Delora Fuel, MD, 6 each at 01/22/23 1034   docusate sodium (COLACE) capsule 100 mg, 100 mg, Oral, BID PRN, Collier Bullock, MD   enoxaparin (LOVENOX) injection 40 mg, 40 mg, Subcutaneous, Q24H, Collier Bullock, MD, 40 mg at 01/22/23 1326   famotidine (PEPCID) tablet 20 mg, 20 mg, Per Tube, BID, Collier Bullock, MD, 20 mg at 01/22/23 2102   feeding supplement (GLUCERNA 1.5 CAL) liquid 1,000 mL, 1,000 mL, Per Tube, Continuous, Simonne Maffucci B, MD, Last Rate: 60 mL/hr at 01/22/23 2100, Infusion Verify at 01/22/23 2100   ferrous sulfate 300 (60 Fe) MG/5ML syrup 300 mg, 300 mg, Per Tube, BID, Gaylan Gerold, DO, 300 mg at 01/22/23 2101   fiber supplement (BANATROL TF) liquid 60 mL, 60 mL, Per Tube, BID, Gaylan Gerold, DO, 60 mL at 01/22/23 2102   free water 200 mL, 200 mL, Per Tube, Q3H, Gaylan Gerold, DO, 200 mL at 01/22/23 2102   furosemide (LASIX) tablet 20 mg, 20 mg, Per Tube, Daily, Gaylan Gerold, DO, 20 mg at 01/22/23 1033   Gerhardt's butt cream, , Topical, TID PRN, Ogan, [place]wo U, MD   insulin aspart (novoLOG) injection 0-15 Units, 0-15 Units, Subcutaneous, Q4H, Freddi Starr, MD, 3 Units at 01/22/23 2039   insulin glargine-yfgn [facility]) injection 20 Units, 20 Units, Subcutaneous, Daily, Gaylan Gerold, DO, 20 Units at 01/22/23 7902   levothyroxine (SYNTHROID) tablet 100 mcg, 100 mcg, Per Tube, Q0600, Collier Bullock, MD, 100 mcg at 01/22/23 0502   liothyronine (CYTOMEL) tablet  7.5 mcg, 7.5 mcg, Per Tube, Q12H, Charlotte Sanes, MD, 7.5 mcg at 01/22/23 2316   lip balm (CARMEX) ointment, , Topical, PRN, Max Fickle B, MD   meropenem (MERREM) 1 g in sodium chloride 0.9 % 100 mL IVPB, 1 g, Intravenous, Q8H, Vu, Trung T, MD, Last Rate: 200 mL/hr at 01/22/23 2101, 1 g at 01/22/23 2101   midazolam (VERSED) injection 1-2 mg, 1-2 mg, Intravenous, Q1H PRN, Migdalia Dk, MD   midodrine (PROAMATINE) tablet 10 mg, 10 mg, Per NG tube, TID WC,  Doran Stabler, DO, 10 mg at 01/22/23 1605   modafinil (PROVIGIL) tablet 100 mg, 100 mg, Per Tube, Daily, Charlotte Sanes, MD, 100 mg at 01/22/23 1610   Oral care mouth rinse, 15 mL, Mouth Rinse, Q2H, Max Fickle B, MD, 15 mL at 01/22/23 2316   Oral care mouth rinse, 15 mL, Mouth Rinse, PRN, Max Fickle B, MD   polyethylene glycol (MIRALAX / GLYCOLAX) packet 17 g, 17 g, Oral, Daily PRN, Charlotte Sanes, MD   sertraline (ZOLOFT) tablet 25 mg, 25 mg, Per Tube, Daily, Charlotte Sanes, MD, 25 mg at 01/22/23 0920   sodium chloride flush (NS) 0.9 % injection 3 mL, 3 mL, Intravenous, Q12H, Byrum, Les Pou, MD   sodium chloride flush (NS) 0.9 % injection 3 mL, 3 mL, Intravenous, PRN, Leslye Peer, MD, 3 mL at 01/22/23 2102  Exam: Current vital signs: BP (!) 93/54   Pulse 65   Temp 98.1 F (36.7 C) (Oral)   Resp 17   Ht 5\' 11"  (1.803 m)   Wt 83.1 kg   SpO2 99%   BMI 25.55 kg/m  Vital signs in last 24 hours: Temp:  [97.9 F (36.6 C)-98.7 F (37.1 C)] 98.1 F (36.7 C) (01/26 1919) Pulse Rate:  [51-79] 65 (01/26 2339) Resp:  [11-20] 17 (01/26 2339) BP: (82-141)/(37-112) 93/54 (01/26 2339) SpO2:  [97 %-100 %] 99 % (01/26 2339) FiO2 (%):  [40 %] 40 % (01/26 2339) Weight:  [83.1 kg] 83.1 kg (01/26 0325) General: Lying in bed, tracheostomy in place, no sedation HEENT: Big scar on the right skull CVS: Regular rhythm Respiratory: Tracheostomy in place Neurological exam Lying in bed with tracheostomy in place no sedation Spontaneously opens eyes and opens eyes to noxious stimulation but is not opening eyes to voice. Does not follow any commands Cranial nerves: Pupils appear equal round reactive to light, gaze is midline, does not blink to threat from either side consistently, face appears grossly symmetric. Motor examination with no withdrawal on the upper extremities and no spontaneous movement.  Minimal triple flexion leg movement in bilateral lower extremities noted to noxious  stimulation Sensory exam: As above  Labs I have reviewed labs in epic and the results pertinent to this consultation are: CBC    Component Value Date/Time   WBC 7.7 01/22/2023 0206   RBC 3.85 (L) 01/22/2023 0206   HGB 11.2 (L) 01/22/2023 0206   HCT 36.4 (L) 01/22/2023 0206   PLT 202 01/22/2023 0206   MCV 94.5 01/22/2023 0206   MCH 29.1 01/22/2023 0206   MCHC 30.8 01/22/2023 0206   RDW 16.9 (H) 01/22/2023 0206   LYMPHSABS 1.9 01/17/2023 2335   MONOABS 1.1 (H) 01/17/2023 2335   EOSABS 0.0 01/17/2023 2335   BASOSABS 0.1 01/17/2023 2335    CMP     Component Value Date/Time   NA 142 01/22/2023 0206   K 4.4 01/22/2023 0206   CL 111 01/22/2023 0206   CO2 26 01/22/2023  0206   GLUCOSE 164 (H) 01/22/2023 0206   BUN 14 01/22/2023 0206   CREATININE 0.32 (L) 01/22/2023 0206   CALCIUM 8.8 (L) 01/22/2023 0206   PROT 8.1 01/17/2023 2335   ALBUMIN 2.4 (L) 01/17/2023 2335   AST 97 (H) 01/17/2023 2335   ALT 156 (H) 01/17/2023 2335   ALKPHOS 131 (H) 01/17/2023 2335   BILITOT 0.5 01/17/2023 2335   GFRNONAA >60 01/22/2023 0206   GFRAA >60 05/15/2020 1921    Lipid Panel     Component Value Date/Time   CHOL 246 (H) 01/22/2020 0552   TRIG 279 (H) 01/22/2020 0552   HDL 33 (L) 01/22/2020 0552   CHOLHDL 7.5 01/22/2020 0552   VLDL 56 (H) 01/22/2020 0552   LDLCALC 157 (H) 01/22/2020 9518    Assessment: -year-old with past history of TBI, admitted with shock, sepsis, noted to have witnessed seizure at bedside. I would recommend obtaining CT head to rule out acute process. He had a routine EEG done yesterday which was unremarkable for seizures.  Given the history of TBI, it is not uncommon to have seizures especially when he has been off of the antiepileptics since December. I would resume his antiepileptics as well.  Impression: Breakthrough seizure in a patient with TBI with recent cessation of antiepileptics and now with acute illness.  Recommendations: Load with Keppra 1500 mg IV  x 1 Start Keppra 500 twice daily Stat CT head to rule out acute process Management of toxic metabolic derangements from team as you are Undergoing goals of care conversation including consideration for comfort measures only-will defer that to the primary team. No need for EEG or LTM given the fact that with his TBI, it is not completely a surprise that he has had a seizure, especially having been off of anticonvulsants since December. Maintain seizure precautions Plan was discussed with Dr. Lucile Shutters Please call neurology with questions as needed.   ADDENDUM I have reviewed the images obtained: CT-head--no acute changes.   Please call neurology with questions as needed.  -- Amie Portland, MD Neurologist [facility] Pager: [phone]  CRITICAL CARE ATTESTATION Performed by: Amie Portland, MD Total critical care time: 35 minutes Critical care time was exclusive of separately billable procedures and treating other patients and/or supervising APPs/Residents/Students Critical care was necessary to treat or prevent imminent or life-threatening deterioration. This patient is critically ill and at significant risk for neurological worsening and/or death and care requires constant monitoring. Critical care was time spent personally by me on the following activities: development of treatment plan with patient and/or surrogate as well as nursing, discussions with consultants, evaluation of patient's response to treatment, examination of patient, obtaining history from patient or surrogate, ordering and performing treatments and interventions, ordering and review of laboratory studies, ordering and review of radiographic studies, pulse oximetry, re-evaluation of patient's condition, participation in multidisciplinary rounds and medical decision making of high complexity in the care of this patient.

## 2023-01-22 NOTE — Progress Notes (Signed)
CSW received call from [employer], [place] case management related to workers comp.  Update on pt given, all questions answered, Malachy Mood then asking for documentation to be sent to her, for a "utilization review", says she is frustrated as she has sent prior requests and "[facility] has been at [facility] since 1/22."  Malachy Mood now asking for the UR team, contact provided. Lurline Idol, MSW, LCSW 1/26/20243:04 PM

## 2023-01-22 NOTE — Consult Note (Incomplete)
Neurology Consultation  Reason for Consult: Seizure Referring Physician: Dr. Lucile Shutters, critical care  CC: Seizure  History is obtained from: Chart, patient's RN  HPI: Joshua Haley is a 72 y.o. 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  She said that he has been very less responsive, does not follow commands-that is not new but what was new was that she came into having his tube feeds and witnessed him to have left facial twitching followed by leftward gaze followed by whole body seizure-like activity that lasted a few minutes.  It resolved spontaneously.  CCM was notified who requested a neurological consultation.. Patient unable to provide any history at this time.  ROS: Unable to obtain due to altered mental status.   Past Medical History:  Diagnosis Date  . Acute on chronic urinary retention   . CAD (coronary artery disease)   . Diabetes mellitus without complication ([place])   . Hypertension   . Hypothyroidism   . Kidney stone   . TBI (traumatic brain injury) ([place])   . Tracheostomy in place [facility])    No family history on file.  Social History:   reports that he has quit smoking. He has never used smokeless tobacco. He reports that he does not currently use alcohol. No history on file for drug use.  Medications  Current Facility-Administered Medications:  .  0.9 %  sodium chloride infusion, , Intravenous, PRN, Collene Gobble, MD, Last Rate: 10 mL/hr at 01/22/23 2100, Infusion Verify at 01/22/23 2100 .  amantadine (SYMMETREL) 50 MG/5ML solution 100 mg, 100 mg, Per Tube, Daily, Gaylan Gerold, DO, 100 mg at 01/22/23 0921 .  Chlorhexidine  Gluconate Cloth 2 % PADS 6 each, 6 each, Topical, Daily, Delora Fuel, MD, 6 each at 01/22/23 1034 .  docusate sodium (COLACE) capsule 100 mg, 100 mg, Oral, BID PRN, Collier Bullock, MD .  enoxaparin (LOVENOX) injection 40 mg, 40 mg, Subcutaneous, Q24H, Collier Bullock, MD, 40 mg at 01/22/23 1326 .  famotidine (PEPCID) tablet 20 mg, 20 mg, Per Tube, BID, Collier Bullock, MD, 20 mg at 01/22/23 2102 .  feeding supplement (GLUCERNA 1.5 CAL) liquid 1,000 mL, 1,000 mL, Per Tube, Continuous, McQuaid, Douglas B, MD, Last Rate: 60 mL/hr at 01/22/23 2100, Infusion Verify at 01/22/23 2100 .  ferrous sulfate 300 (60 Fe) MG/5ML syrup 300 mg, 300 mg, Per Tube, BID, Gaylan Gerold, DO, 300 mg at 01/22/23 2101 .  fiber supplement (BANATROL TF) liquid 60 mL, 60 mL, Per Tube, BID, Gaylan Gerold, DO, 60 mL at 01/22/23 2102 .  free water 200 mL, 200 mL, Per Tube, Q3H, Gaylan Gerold, DO, 200 mL at 01/22/23 2102 .  furosemide (LASIX) tablet 20 mg, 20 mg, Per Tube, Daily, Gaylan Gerold, DO, 20 mg at 01/22/23 1033 .  Gerhardt's butt cream, , Topical, TID PRN, Ogan, Okoronkwo U, MD .  insulin aspart (novoLOG) injection 0-15 Units, 0-15 Units, Subcutaneous, Q4H, Freddi Starr, MD, 3 Units at 01/22/23 2039 .  insulin glargine-yfgn (SEMGLEE) injection 20 Units, 20 Units, Subcutaneous, Daily, Gaylan Gerold, DO, 20 Units at 01/22/23 8295 .  levothyroxine (SYNTHROID) tablet 100 mcg, 100 mcg, Per Tube, A2130, Collier Bullock, MD, 100 mcg at 01/22/23 0502 .  liothyronine (CYTOMEL) tablet  7.5 mcg, 7.5 mcg, Per Tube, Q12H, Collier Bullock, MD, 7.5 mcg at 01/22/23 2316 .  lip balm (CARMEX) ointment, , Topical, PRN, McQuaid, Douglas B, MD .  meropenem (MERREM) 1 g in sodium chloride 0.9 % 100 mL IVPB, 1 g, Intravenous, Q8H, Vu, Trung T, MD, Last Rate: 200 mL/hr at 01/22/23 2101, 1 g at 01/22/23 2101 .  midazolam (VERSED) injection 1-2 mg, 1-2 mg, Intravenous, Q1H PRN, Ogan, Okoronkwo U, MD .  midodrine (PROAMATINE) tablet 10 mg,  10 mg, Per NG tube, TID WC, Gaylan Gerold, DO, 10 mg at 01/22/23 1605 .  modafinil (PROVIGIL) tablet 100 mg, 100 mg, Per Tube, Daily, Collier Bullock, MD, 100 mg at 01/22/23 0921 .  Oral care mouth rinse, 15 mL, Mouth Rinse, Q2H, McQuaid, Douglas B, MD, 15 mL at 01/22/23 2316 .  Oral care mouth rinse, 15 mL, Mouth Rinse, PRN, McQuaid, Douglas B, MD .  polyethylene glycol (MIRALAX / GLYCOLAX) packet 17 g, 17 g, Oral, Daily PRN, Collier Bullock, MD .  sertraline (ZOLOFT) tablet 25 mg, 25 mg, Per Tube, Daily, Collier Bullock, MD, 25 mg at 01/22/23 0920 .  sodium chloride flush (NS) 0.9 % injection 3 mL, 3 mL, Intravenous, Q12H, Byrum, Rose Fillers, MD .  sodium chloride flush (NS) 0.9 % injection 3 mL, 3 mL, Intravenous, PRN, Collene Gobble, MD, 3 mL at 01/22/23 2102  Exam: Current vital signs: BP (!) 93/54   Pulse 65   Temp 98.1 F (36.7 C) (Oral)   Resp 17   Ht 5\' 11"  (1.803 m)   Wt 83.1 kg   SpO2 99%   BMI 25.55 kg/m  Vital signs in last 24 hours: Temp:  [97.9 F (36.6 C)-98.7 F (37.1 C)] 98.1 F (36.7 C) (01/26 1919) Pulse Rate:  [51-79] 65 (01/26 2339) Resp:  [11-20] 17 (01/26 2339) BP: (82-141)/(37-112) 93/54 (01/26 2339) SpO2:  [97 %-100 %] 99 % (01/26 2339) FiO2 (%):  [40 %] 40 % (01/26 2339) Weight:  [83.1 kg] 83.1 kg (01/26 0325) General:  GENERAL: Awake, alert in NAD HEENT: - Normocephalic and atraumatic, dry mm, no LN++, no Thyromegally LUNGS - Clear to auscultation bilaterally with no wheezes CV - S1S2 RRR, no m/r/g, equal pulses bilaterally. ABDOMEN - Soft, nontender, nondistended with normoactive BS Ext: warm, well perfused, intact peripheral pulses, __ edema  NEURO:  Mental Status: AA&Ox3 *** Language: speech is _____.  Naming, repetition, fluency, and comprehension intact.*** Cranial Nerves: PERRL*** EOMI***, visual fields full***, no facial asymmetry***,*** facial sensation intact, hearing intact, tongue/uvula/soft palate midline, normal***  sternocleidomastoid and trapezius muscle strength. No evidence of tongue atrophy or fibrillations*** Motor: *** Tone: is normal and bulk is normal*** Sensation- Intact to light touch bilaterally*** Coordination: FTN intact bilaterally, no ataxia in BLE.*** Gait- deferred***  NIHSS*** 1a Level of Conscious.: *** 1b LOC Questions: *** 1c LOC Commands: *** 2 Best Gaze: *** 3 Visual: *** 4 Facial Palsy: *** 5a Motor Arm - left: *** 5b Motor Arm - Right: *** 6a Motor Leg - Left: *** 6b Motor Leg - Right: *** 7 Limb Ataxia: *** 8 Sensory: *** 9 Best Language: *** 10 Dysarthria: *** 11 Extinct. and Inatten.: *** TOTAL: ***   Labs I have reviewed labs in epic and the results pertinent to this consultation are: *** CBC    Component Value Date/Time   WBC 7.7 01/22/2023 0206   RBC 3.85 (L) 01/22/2023 0206   HGB 11.2 (L) 01/22/2023 0206   HCT 36.4 (L) 01/22/2023 0206  PLT 202 01/22/2023 0206   MCV 94.5 01/22/2023 0206   MCH 29.1 01/22/2023 0206   MCHC 30.8 01/22/2023 0206   RDW 16.9 (H) 01/22/2023 0206   LYMPHSABS 1.9 01/17/2023 2335   MONOABS 1.1 (H) 01/17/2023 2335   EOSABS 0.0 01/17/2023 2335   BASOSABS 0.1 01/17/2023 2335    CMP     Component Value Date/Time   NA 142 01/22/2023 0206   K 4.4 01/22/2023 0206   CL 111 01/22/2023 0206   CO2 26 01/22/2023 0206   GLUCOSE 164 (H) 01/22/2023 0206   BUN 14 01/22/2023 0206   CREATININE 0.32 (L) 01/22/2023 0206   CALCIUM 8.8 (L) 01/22/2023 0206   PROT 8.1 01/17/2023 2335   ALBUMIN 2.4 (L) 01/17/2023 2335   AST 97 (H) 01/17/2023 2335   ALT 156 (H) 01/17/2023 2335   ALKPHOS 131 (H) 01/17/2023 2335   BILITOT 0.5 01/17/2023 2335   GFRNONAA >60 01/22/2023 0206   GFRAA >60 05/15/2020 1921    Lipid Panel     Component Value Date/Time   CHOL 246 (H) 01/22/2020 0552   TRIG 279 (H) 01/22/2020 0552   HDL 33 (L) 01/22/2020 0552   CHOLHDL 7.5 01/22/2020 0552   VLDL 56 (H) 01/22/2020 0552   LDLCALC 157 (H) 01/22/2020 0552      Imaging I have reviewed the images obtained:  CT-head***  MRI examination of the brain***  Assessment: ***  {Cerebrovascular Diagnosis:21265}  Impression:***  Recommendations:*** Load with Keppra 1500 mg IV x 1 Start Keppra 500 twice daily Stat CT head to rule out acute process Management of toxic metabolic derangements from team as you are Undergoing goals of care conversation including consideration for comfort measures only-will defer that to the primary team. No need for EEG or LTM given the fact that with his TBI, it is not completely a surprise that he has had a seizure, especially having been off of anticonvulsants since December. Maintain seizure precautions Plan was discussed with Dr. Warrick Parisian Please call neurology with questions as needed.   -- Milon Dikes, MD Neurologist [facility] Pager: [phone]  CRITICAL CARE ATTESTATION Performed by: Milon Dikes, MD Total critical care time: 35 minutes Critical care time was exclusive of separately billable procedures and treating other patients and/or supervising APPs/Residents/Students Critical care was necessary to treat or prevent imminent or life-threatening deterioration. This patient is critically ill and at significant risk for neurological worsening and/or death and care requires constant monitoring. Critical care was time spent personally by me on the following activities: development of treatment plan with patient and/or surrogate as well as nursing, discussions with consultants, evaluation of patient's response to treatment, examination of patient, obtaining history from patient or surrogate, ordering and performing treatments and interventions, ordering and review of laboratory studies, ordering and review of radiographic studies, pulse oximetry, re-evaluation of patient's condition, participation in multidisciplinary rounds and medical decision making of high complexity in the care of this  patient.

## 2023-01-22 NOTE — IPAL (Signed)
I spoke to patient's younger sister at bedside today.  She stated that they had a family discussion last night about his goal of care.  Most of them agreed with transition to comfort care, including patient's spouse Hope.  She stated that patient's oldest son, Corene Cornea, needed more time to think about his decision and will come to bedside this weekend or early next week.  I called and spoke to this patient's spouse, Hope, over the phone.  She confirmed with me that she is the healthcare power of attorney.  I updated her on his clinical status today.  She confirmed with me about her decision regarding comfort care.  She would like patient to remain at [facility] to allow his immediate family members to come and visit.  Patient's oldest son will come to bedside by either Monday or Tuesday.  I encouraged her to call us if she has any questions or concern.   Code status: DNR

## 2023-01-23 ENCOUNTER — Inpatient Hospital Stay (HOSPITAL_COMMUNITY): Payer: PRIVATE HEALTH INSURANCE

## 2023-01-23 DIAGNOSIS — A419 Sepsis, unspecified organism: Secondary | ICD-10-CM | POA: Diagnosis not present

## 2023-01-23 DIAGNOSIS — J189 Pneumonia, unspecified organism: Secondary | ICD-10-CM | POA: Diagnosis not present

## 2023-01-23 DIAGNOSIS — R569 Unspecified convulsions: Secondary | ICD-10-CM | POA: Diagnosis not present

## 2023-01-23 LAB — BASIC METABOLIC PANEL
Anion gap: 9 (ref 5–15)
BUN: 12 mg/dL (ref 8–23)
CO2: 22 mmol/L (ref 22–32)
Calcium: 8.8 mg/dL — ABNORMAL LOW (ref 8.9–10.3)
Chloride: 106 mmol/L (ref 98–111)
Creatinine, Ser: 0.37 mg/dL — ABNORMAL LOW (ref 0.61–1.24)
GFR, Estimated: >60 mL/min (ref >60)
Glucose, Bld: 120 mg/dL — ABNORMAL HIGH (ref 70–99)
Potassium: 4.3 mmol/L (ref 3.5–5.1)
Sodium: 137 mmol/L (ref 135–145)

## 2023-01-23 LAB — CULTURE, BLOOD (ROUTINE X 2)
Culture: NO GROWTH
Culture: NO GROWTH

## 2023-01-23 LAB — GLUCOSE, CAPILLARY
Glucose-Capillary: 114 mg/dL — ABNORMAL HIGH (ref 70–99)
Glucose-Capillary: 117 mg/dL — ABNORMAL HIGH (ref 70–99)
Glucose-Capillary: 132 mg/dL — ABNORMAL HIGH (ref 70–99)
Glucose-Capillary: 145 mg/dL — ABNORMAL HIGH (ref 70–99)
Glucose-Capillary: 166 mg/dL — ABNORMAL HIGH (ref 70–99)
Glucose-Capillary: 156 mg/dL — ABNORMAL HIGH (ref 70–99)

## 2023-01-23 LAB — CBC
HCT: 37.3 % — ABNORMAL LOW (ref 39.0–52.0)
Hemoglobin: 11.8 g/dL — ABNORMAL LOW (ref 13.0–17.0)
MCH: 29.4 pg (ref 26.0–34.0)
MCHC: 31.6 g/dL (ref 30.0–36.0)
MCV: 92.8 fL (ref 80.0–100.0)
Platelets: 227 10*3/uL (ref 150–400)
RBC: 4.02 MIL/uL — ABNORMAL LOW (ref 4.22–5.81)
RDW: 17.2 % — ABNORMAL HIGH (ref 11.5–15.5)
WBC: 7.8 10*3/uL (ref 4.0–10.5)
nRBC: 0 % (ref 0.0–0.2)

## 2023-01-23 MED ORDER — LEVETIRACETAM IN NACL 1500 MG/100ML IV SOLN
1500.0000 mg | INTRAVENOUS | Status: AC
  Administered 2023-01-23: 1500 mg via INTRAVENOUS
  Filled 2023-01-23: qty 100

## 2023-01-23 MED ORDER — FUROSEMIDE 40 MG PO TABS
20.0000 mg | ORAL_TABLET | Freq: Two times a day (BID) | ORAL | Status: DC
  Administered 2023-01-23 – 2023-01-24 (×4): 20 mg
  Filled 2023-01-23 (×4): qty 1

## 2023-01-23 MED ORDER — FREE WATER
200.0000 mL | Status: DC
  Administered 2023-01-23 – 2023-01-24 (×7): 200 mL

## 2023-01-23 MED ORDER — LEVETIRACETAM 500 MG PO TABS
500.0000 mg | ORAL_TABLET | Freq: Two times a day (BID) | ORAL | Status: DC
  Administered 2023-01-23 – 2023-01-27 (×9): 500 mg
  Filled 2023-01-23 (×9): qty 1

## 2023-01-23 NOTE — Progress Notes (Signed)
Pharmacy Antibiotic Note  Joshua Haley is a 72 y.o. male admitted on 01/17/2023 with pneumonia.  Pharmacy has been consulted for meropenem dosing.  SCr 0.34 and improving. UOP 1.2 L in 24 hours. WBC 7.6 overall trending down and afebrile today. Imaging from 1/21 shows mild left basilar atelectasis and/or infiltrate.   Culture with carbapenem resistance but ID following and patient clinically improving on therapy - plan for 10 days total to end 1/28.  Plan: Continue Meropenem 1g IV every 8 hours as ordered through 01/24/23.  Follow cultures and signs/symptoms of infection Pharmacy will sign off consult. Please re-consult if needed.   Height: 5\' 11"  (180.3 cm) Weight: 83.1 kg (183 lb 3.2 oz) IBW/kg (Calculated) : 75.3  Temp (24hrs), Avg:98.1 F (36.7 C), Min:97.8 F (36.6 C), Max:98.7 F (37.1 C)  Recent Labs  Lab 01/17/23 2335 01/18/23 0236 01/18/23 0403 01/18/23 1602 01/18/23 1747 01/18/23 2000 01/19/23 0317 01/20/23 0349 01/21/23 0404 01/22/23 0206 01/23/23 0639  WBC 12.7*  --    < >  --   --   --  8.2 6.2 7.6 7.7 7.8  CREATININE 0.98  --    < >  --    < >  --  0.48* 0.44* 0.34* 0.32* 0.37*  LATICACIDVEN 3.1* 3.2*  --  1.9  --  1.7  --   --   --   --   --    < > = values in this interval not displayed.    Estimated Creatinine Clearance: 90.2 mL/min (A) (by C-G formula based on SCr of 0.37 mg/dL (L)).    Allergies  Allergen Reactions   Morphine And Related    Penicillins Other (See Comments)    Has tolerated Cefepime and Ceftriaxone   Statins Nausea And Vomiting    Antimicrobials this admission: Mero 1/18>> - started at [place] 1/22>>1/23   Microbiology results: 1/21 BCx: no growth x3 days 1/21 UCx: yeast  1/22 Trach aspirate: few serratia and pseudomonas  1/22 MRSA PCR: negative 1/21 RVP: negative  Thank you for allowing pharmacy to be a part of this patient's care.  Sloan Leiter, PharmD, BCPS, BCCCP Clinical Pharmacist Please refer to [facility] for  [facility] Pharm[facility] number 01/23/2023 1:37 PM

## 2023-01-23 NOTE — Progress Notes (Signed)
Patient transported to CT and back to 57M with no adverse events noted.

## 2023-01-23 NOTE — Progress Notes (Signed)
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  Started on Vanc for PNA about 3 days ago.  Tachycardic today.  Baseline mental status: alert but non verbal.  Sacral decubitus ulcer.  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 Coli UTI and bilateral pneumonia on 12/28/2022.  Initially febrile and less responsive at [facility] and requiring increased O2 on trach collar.   Significant Hospital Events: Including procedures, antibiotic start and stop dates in addition to other pertinent events   1/22 admit to ICU septic shock PNA, looks very dehydrated as well   Interim History / Subjective:  Patient is awake and alert and track.  Does not follow commands  Objective   Blood pressure 94/64, pulse 62, temperature 98.7 F (37.1 C), temperature source Oral, resp. rate 12, height 5\' 11"  (1.803 m), weight 83.1 kg, SpO2 100 %.    Vent Mode: PRVC FiO2 (%):  [40 %] 40 % Set Rate:  [15 bmp] 15 bmp Vt Set:  [500 mL] 500 mL PEEP:  [5 cmH20] 5 cmH20 Pressure Support:  [8 cmH20] 8 cmH20 Plateau Pressure:  [13 cmH20] 13 cmH20   Intake/Output Summary (Last 24 hours) at 01/23/2023 0750 Last data filed at 01/23/2023 0700 Gross per 24 hour  Intake 6248.79 ml  Output 3075 ml  Net 3173.79 ml    Filed Weights   01/19/23 0310 01/20/23 0356 01/22/23 0325  Weight: 79.9 kg  79.9 kg 83.1 kg    Examination: General: Chronically ill trach vent.  In no acute distress HENT: Tracheostomy in place Lungs: Coarse. No wheezing Cardiovascular: rrr Abdomen: + PEG, soft  Extremities: Symmetrical muscle wasting. Trace - +1 edema of bilat LE Neuro: Awake and alert but not following commands he is he is awake and alert now but is not  Blood culture: - 3 days UA: No pyuria Urine culture: > 100k Yeast MRSA: Negative Respiratory culture: Pseudomonas resistant to pending.  Serratia resistant to cefazolin and ciprofloxacin.  Resolved Hospital Problem list     Assessment & Plan:  Septic shock, unclear etiology at this time (PNA, UTI, bacteremia with PICC  Possibly hypovolemic shock superimposed  The most likely source is left basilar pneumonia.  Blood culture negative to date.  UC grew yeast. MRSA negative.  Respiratory culture grew Serratia and Pseudomonas resistant to penem. -Continue meropenem to complete 10 days (1/18 - 1/28 ). Discussed with ID.  If his respiratory status worsens, can switch to cefepime but there is a risk of encephalopathy.  It seems like the Pseudomonas was colonized. -Continue midodrine 10 mg TID. He is off of levophed -Continue tube feed and free water  Acute on chronic respiratory failure with hypoxia  Trach dependent (baseline FiO2 28%) -Continue vent support.  Wean as able. Currently on weaning  -Continue IV antibiotics as above -Check CXR  Acute metabolic/infectious  encephalopathy History of post TBI seizure Baseline is alert, awake, track and able to use his right side extremities.   Patient had a seizure-like to video last night with left facial twitching and whole body tremors that lasted about 2 minutes.  Resolved spontaneously.  Neurology was consulted and recommended Keppra.  CT head was obtained which was negative for any acute intracranial malady.  With his TBI and acute illness, will continue Keppra after  discharge.  Hypernatremia -improving - Decrease Free water flushes 200 cc every 4 hours  Hypothyroidism  Not sure why he is on both T3 and T4 supplementation at home. TSH and T4 WNL.  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  I/O + 7.5 -Resume home lasix 20 mg BID and watch BP closely.  -Hold metop for now  Chronic malnutrition -Continue tube feed  T2DM -Semglee 20u daily  GOC discussion with wife via phone yesterday  Best Practice (right click and "Reselect all SmartList Selections" daily)   Diet/type: tubefeeds DVT prophylaxis: systemic dose LMWH GI prophylaxis: H2B Lines: N/A  Foley:  Yes, still needed Code Status:  DNR Last date of multidisciplinary goals of care discussion [1/26]  Labs   CBC: Recent Labs  Lab 01/17/23 2335 01/18/23 0027 01/19/23 0317 01/20/23 0349 01/21/23 0404 01/21/23 0956 01/22/23 0206 01/23/23 0639  WBC 12.7*   < > 8.2 6.2 7.6  --  7.7 7.8  NEUTROABS 9.5*  --   --   --   --   --   --   --   HGB 16.0   < > 11.3* 10.4* 10.2* 10.2* 11.2* 11.8*  HCT 54.9*   < > 39.0 34.9* 34.6* 30.0* 36.4* 37.3*  MCV 99.1   < > 98.5 98.3 96.6  --  94.5 92.8  PLT 344   < > 187 187 232  --  202 227   < > = values in this interval not displayed.     Basic Metabolic Panel: Recent Labs  Lab 01/18/23 1747 01/19/23 0306 01/19/23 0317 01/19/23 1330 01/19/23 1700 01/20/23 0349 01/21/23 0404 01/21/23 0956 01/22/23 0206 01/23/23 0639  NA 154*   < > 153*  --   --  153* 147* 147* 142 137  K 2.8*   < > 3.0*  --   --  3.7 3.9 3.9 4.4 4.3  CL 118*  --  117*  --   --  119* 113*  --  111 106  CO2 31  --  29  --   --  29 28  --  26 22  GLUCOSE 213*  --  193*  --   --  208* 202*  --  164* 120*  BUN 45*  --  45*  --   --  32* 18  --  14 12  CREATININE 0.50*  --  0.48*  --   --  0.44* 0.34*  --  0.32* 0.37*  CALCIUM 8.7*  --  8.7*  --   --   8.6* 8.5*  --  8.8* 8.8*  MG 1.8  --  2.2 2.1 1.9 2.4  --   --   --   --   PHOS 1.8*  --  3.2 2.4* 4.1 1.9*  --   --   --   --    < > = values in this interval not displayed.    GFR: Estimated  Creatinine Clearance: 90.2 mL/min (A) (by C-G formula based on SCr of 0.37 mg/dL (L)). Recent Labs  Lab 01/17/23 2335 01/18/23 0236 01/18/23 0403 01/18/23 1139 01/18/23 1602 01/18/23 2000 01/19/23 0317 01/20/23 0349 01/21/23 0404 01/22/23 0206 01/23/23 0639  PROCALCITON  --   --   --  0.66  --   --  0.58  --   --   --   --   WBC 12.7*  --    < >  --   --   --  8.2 6.2 7.6 7.7 7.8  LATICACIDVEN 3.1* 3.2*  --   --  1.9 1.7  --   --   --   --   --    < > = values in this interval not displayed.     Liver Function Tests: Recent Labs  Lab 01/17/23 2335  AST 97*  ALT 156*  ALKPHOS 131*  BILITOT 0.5  PROT 8.1  ALBUMIN 2.4*    No results for input(s): "LIPASE", "AMYLASE" in the last 168 hours. Recent Labs  Lab 01/20/23 1147  AMMONIA 31     ABG    Component Value Date/Time   PHART 7.455 (H) 01/21/2023 0956   PCO2ART 37.9 01/21/2023 0956   PO2ART 87 01/21/2023 0956   HCO3 26.7 01/21/2023 0956   TCO2 28 01/21/2023 0956   O2SAT 97 01/21/2023 0956     Coagulation Profile: Recent Labs  Lab 01/17/23 2335  INR 1.3*     Cardiac Enzymes: No results for input(s): "CKTOTAL", "CKMB", "CKMBINDEX", "TROPONINI" in the last 168 hours.  HbA1C: Hgb A1c MFr Bld  Date/Time Value Ref Range Status  12/29/2022 12:07 AM 9.2 (H) 4.8 - 5.6 % Final    Comment:    (NOTE)         Prediabetes: 5.7 - 6.4         Diabetes: >6.4         Glycemic control for adults with diabetes: <7.0   06/29/2022 06:33 AM 7.5 (H) 4.8 - 5.6 % Final    Comment:    (NOTE) Pre diabetes:          5.7%-6.4%  Diabetes:              >6.4%  Glycemic control for   <7.0% adults with diabetes     CBG: Recent Labs  Lab 01/22/23 1519 01/22/23 1919 01/22/23 2327 01/23/23 0340 01/23/23 0739  GLUCAP  145* 153* 159* [place] Gaylan Gerold, DO Internal Medicine Residency My pager: [phone]

## 2023-01-24 DIAGNOSIS — J9601 Acute respiratory failure with hypoxia: Secondary | ICD-10-CM | POA: Diagnosis not present

## 2023-01-24 DIAGNOSIS — A419 Sepsis, unspecified organism: Secondary | ICD-10-CM | POA: Diagnosis not present

## 2023-01-24 LAB — GLUCOSE, CAPILLARY
Glucose-Capillary: 141 mg/dL — ABNORMAL HIGH (ref 70–99)
Glucose-Capillary: 132 mg/dL — ABNORMAL HIGH (ref 70–99)
Glucose-Capillary: 131 mg/dL — ABNORMAL HIGH (ref 70–99)
Glucose-Capillary: 137 mg/dL — ABNORMAL HIGH (ref 70–99)
Glucose-Capillary: 123 mg/dL — ABNORMAL HIGH (ref 70–99)
Glucose-Capillary: 155 mg/dL — ABNORMAL HIGH (ref 70–99)

## 2023-01-24 LAB — BASIC METABOLIC PANEL
Anion gap: 12 (ref 5–15)
BUN: 13 mg/dL (ref 8–23)
CO2: 21 mmol/L — ABNORMAL LOW (ref 22–32)
Calcium: 9.1 mg/dL (ref 8.9–10.3)
Chloride: 99 mmol/L (ref 98–111)
Creatinine, Ser: 0.37 mg/dL — ABNORMAL LOW (ref 0.61–1.24)
GFR, Estimated: >60 mL/min (ref >60)
Glucose, Bld: 148 mg/dL — ABNORMAL HIGH (ref 70–99)
Potassium: 5.1 mmol/L (ref 3.5–5.1)
Sodium: 132 mmol/L — ABNORMAL LOW (ref 135–145)

## 2023-01-24 LAB — CBC
HCT: 37.1 % — ABNORMAL LOW (ref 39.0–52.0)
Hemoglobin: 12.3 g/dL — ABNORMAL LOW (ref 13.0–17.0)
MCH: 29.2 pg (ref 26.0–34.0)
MCHC: 33.2 g/dL (ref 30.0–36.0)
MCV: 88.1 fL (ref 80.0–100.0)
Platelets: 261 10*3/uL (ref 150–400)
RBC: 4.21 MIL/uL — ABNORMAL LOW (ref 4.22–5.81)
RDW: 17.4 % — ABNORMAL HIGH (ref 11.5–15.5)
WBC: 12.9 10*3/uL — ABNORMAL HIGH (ref 4.0–10.5)
nRBC: 0 % (ref 0.0–0.2)

## 2023-01-24 MED ORDER — FREE WATER
200.0000 mL | Freq: Three times a day (TID) | Status: DC
  Administered 2023-01-24 – 2023-01-25 (×2): 200 mL

## 2023-01-24 NOTE — Progress Notes (Signed)
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  Started on Vanc for PNA about 3 days ago.  Tachycardic today.  Baseline mental status: alert but non verbal.  Sacral decubitus ulcer.  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 Coli UTI and bilateral pneumonia on 12/28/2022.  Initially febrile and less responsive at [facility] and requiring increased O2 on trach collar.   Significant Hospital Events: Including procedures, antibiotic start and stop dates in addition to other pertinent events   1/22 admit to ICU septic shock PNA, looks very dehydrated as well   Interim History / Subjective:  Patient is awake and alert and track.  He follows commands.  Objective   Blood pressure (!) 104/57, pulse 76, temperature 98 F (36.7 C), temperature source Oral, resp. rate 18, height 5\' 11"  (1.803 m), weight 83.1 kg, SpO2 96 %.    FiO2 (%):  [35 %] 35 %   Intake/Output Summary (Last 24 hours) at 01/24/2023 0940 Last data filed at 01/24/2023 0800 Gross per 24 hour  Intake 2468.36 ml  Output 3575 ml  Net -1106.64 ml    Filed Weights   01/19/23 0310 01/20/23 0356 01/22/23 0325  Weight: 79.9 kg 79.9 kg 83.1 kg    Examination: General: Chronically ill trach vent.  In no acute distress HENT: Tracheostomy in place Lungs: Coarse. No wheezing Cardiovascular:  rrr Abdomen: + PEG, soft  Extremities: Symmetrical muscle wasting. Trace - +1 edema of bilat LE Neuro: Awake and alert but not following commands   Blood culture: - 5 days UA: No pyuria Urine culture: > 100k Yeast MRSA: Negative Respiratory culture: Pseudomonas resistant to pending.  Serratia resistant to cefazolin and ciprofloxacin.  Resolved Hospital Problem list     Assessment & Plan:  Septic shock, unclear etiology at this time (PNA, UTI, bacteremia with PICC  Possibly hypovolemic shock superimposed  The most likely source is left basilar pneumonia.  Blood culture negative to date.  UC grew yeast. MRSA negative.  Respiratory culture grew Serratia and Pseudomonas resistant to penem. -Continue meropenem to complete 10 days (1/18 - 1/28 ). Discussed with ID.  If his respiratory status worsens, can switch to cefepime but there is a risk of encephalopathy.  It seems like the Pseudomonas was colonized. -Continue midodrine 10 mg TID. 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  Will continue weaning if able -Continue IV antibiotics as above  Acute metabolic/infectious encephalopathy History of post TBI seizure Baseline is alert, awake, track and able to use his right side extremities.   -Continue Keppra indefinitely  Hypernatremia  -Continue Free water flushes 200 cc every 4 hours.  Adjustment based on his BMP today  Hypothyroidism  Not sure why he is on both  T3 and T4 supplementation at home. TSH and T4 WNL.  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  I/O + 7.5 -Continue home Lasix 20 mg BID.  He has good urine output overnight.  He is still 6 L positive overall -Hold metop for now  Chronic malnutrition -Continue tube feed  T2DM -Semglee 20u  daily  Family will come to bedside today.  Best Practice (right click and "Reselect all SmartList Selections" daily)   Diet/type: tubefeeds DVT prophylaxis: systemic dose LMWH GI prophylaxis: H2B Lines: N/A  Foley:  Yes, still needed Code Status:  DNR Last date of multidisciplinary goals of care discussion [1/26]  Labs   CBC: Recent Labs  Lab 01/17/23 2335 01/18/23 0027 01/19/23 0317 01/20/23 0349 01/21/23 0404 01/21/23 0956 01/22/23 0206 01/23/23 0639  WBC 12.7*   < > 8.2 6.2 7.6  --  7.7 7.8  NEUTROABS 9.5*  --   --   --   --   --   --   --   HGB 16.0   < > 11.3* 10.4* 10.2* 10.2* 11.2* 11.8*  HCT 54.9*   < > 39.0 34.9* 34.6* 30.0* 36.4* 37.3*  MCV 99.1   < > 98.5 98.3 96.6  --  94.5 92.8  PLT 344   < > 187 187 232  --  202 227   < > = values in this interval not displayed.     Basic Metabolic Panel: Recent Labs  Lab 01/18/23 1747 01/19/23 0306 01/19/23 0317 01/19/23 1330 01/19/23 1700 01/20/23 0349 01/21/23 0404 01/21/23 0956 01/22/23 0206 01/23/23 0639  NA 154*   < > 153*  --   --  153* 147* 147* 142 137  K 2.8*   < > 3.0*  --   --  3.7 3.9 3.9 4.4 4.3  CL 118*  --  117*  --   --  119* 113*  --  111 106  CO2 31  --  29  --   --  29 28  --  26 22  GLUCOSE 213*  --  193*  --   --  208* 202*  --  164* 120*  BUN 45*  --  45*  --   --  32* 18  --  14 12  CREATININE 0.50*  --  0.48*  --   --  0.44* 0.34*  --  0.32* 0.37*  CALCIUM 8.7*  --  8.7*  --   --  8.6* 8.5*  --  8.8* 8.8*  MG 1.8  --  2.2 2.1 1.9 2.4  --   --   --   --   PHOS 1.8*  --  3.2 2.4* 4.1 1.9*  --   --   --   --    < > = values in this interval not displayed.    GFR: Estimated Creatinine Clearance: 90.2 mL/min (A) (by C-G formula based on SCr of 0.37 mg/dL (L)). Recent Labs  Lab 01/17/23 2335 01/18/23 0236 01/18/23 0403 01/18/23 1139 01/18/23 1602 01/18/23 2000 01/19/23 0317 01/20/23 0349 01/21/23 0404 01/22/23 0206 01/23/23 0639  PROCALCITON  --   --   --  0.66  --   --   0.58  --   --   --   --   WBC 12.7*  --    < >  --   --   --  8.2 6.2 7.6 7.7 7.8  LATICACIDVEN 3.1* 3.2*  --   --  1.9 1.7  --   --   --   --   --    < > = values in this interval not displayed.     Liver Function Tests: Recent Labs  Lab 01/17/23 2335  AST 97*  ALT 156*  ALKPHOS 131*  BILITOT 0.5  PROT 8.1  ALBUMIN 2.4*    No results for input(s): "LIPASE", "AMYLASE" in the last 168 hours. Recent Labs  Lab 01/20/23 1147  AMMONIA 31     ABG    Component Value Date/Time   PHART 7.455 (H) 01/21/2023 0956   PCO2ART 37.9 01/21/2023 0956   PO2ART 87 01/21/2023 0956   HCO3 26.7 01/21/2023 0956   TCO2 28 01/21/2023 0956   O2SAT 97 01/21/2023 0956     Coagulation Profile: Recent Labs  Lab 01/17/23 2335  INR 1.3*     Cardiac Enzymes: No results for input(s): "CKTOTAL", "CKMB", "CKMBINDEX", "TROPONINI" in the last 168 hours.  HbA1C: Hgb A1c MFr Bld  Date/Time Value Ref Range Status  12/29/2022 12:07 AM 9.2 (H) 4.8 - 5.6 % Final    Comment:    (NOTE)         Prediabetes: 5.7 - 6.4         Diabetes: >6.4         Glycemic control for adults with diabetes: <7.0   06/29/2022 06:33 AM 7.5 (H) 4.8 - 5.6 % Final    Comment:    (NOTE) Pre diabetes:          5.7%-6.4%  Diabetes:              >6.4%  Glycemic control for   <7.0% adults with diabetes     CBG: Recent Labs  Lab 01/23/23 1520 01/23/23 2129 01/23/23 2328 01/24/23 0337 01/24/23 0725  GLUCAP 145* 166* 156* 141* 132*    CRITICAL CARE Doran Stabler, DO Internal Medicine Residency My pager: [phone]

## 2023-01-24 NOTE — IPAL (Signed)
I spoke with patient's wife, Hope and her daughter at bedside.  I updated them on his current progress.  Hope states that patient was doing well initially at [facility] after his TBI which he was able to talk and eat. However Hope noticed the slow decline over the last year.  I explained that this may be his new mental status baseline.  He very likely will be dependent on trach collar indefinitely after repeated pneumonia infections.  She agrees that his quality of life is poor and she does not want him to suffer.  She states that most of his family is on board with transition to comfort care except for Corene Cornea, his oldest son.  Corene Cornea will come to bedside on Monday or Tuesday to visit his father and to discuss with CCM team.  She would like patient to remain at [facility] when they transition him to comfort care.  All questions were answered.  CODE STATUS: DNR

## 2023-01-25 ENCOUNTER — Other Ambulatory Visit: Payer: Self-pay

## 2023-01-25 DIAGNOSIS — A419 Sepsis, unspecified organism: Secondary | ICD-10-CM | POA: Diagnosis not present

## 2023-01-25 LAB — BASIC METABOLIC PANEL
Anion gap: 10 (ref 5–15)
BUN: 10 mg/dL (ref 8–23)
CO2: 24 mmol/L (ref 22–32)
Calcium: 8.7 mg/dL — ABNORMAL LOW (ref 8.9–10.3)
Chloride: 97 mmol/L — ABNORMAL LOW (ref 98–111)
Creatinine, Ser: 0.37 mg/dL — ABNORMAL LOW (ref 0.61–1.24)
GFR, Estimated: >60 mL/min (ref >60)
Glucose, Bld: 150 mg/dL — ABNORMAL HIGH (ref 70–99)
Potassium: 3.9 mmol/L (ref 3.5–5.1)
Sodium: 131 mmol/L — ABNORMAL LOW (ref 135–145)

## 2023-01-25 LAB — CBC
HCT: 34.1 % — ABNORMAL LOW (ref 39.0–52.0)
Hemoglobin: 11.1 g/dL — ABNORMAL LOW (ref 13.0–17.0)
MCH: 29.3 pg (ref 26.0–34.0)
MCHC: 32.6 g/dL (ref 30.0–36.0)
MCV: 90 fL (ref 80.0–100.0)
Platelets: 380 10*3/uL (ref 150–400)
RBC: 3.79 MIL/uL — ABNORMAL LOW (ref 4.22–5.81)
RDW: 17.5 % — ABNORMAL HIGH (ref 11.5–15.5)
WBC: 8.4 10*3/uL (ref 4.0–10.5)
nRBC: 0 % (ref 0.0–0.2)

## 2023-01-25 LAB — TROPONIN I (HIGH SENSITIVITY)
Troponin I (High Sensitivity): 8 ng/L (ref <18)
Troponin I (High Sensitivity): 8 ng/L (ref <18)

## 2023-01-25 LAB — GLUCOSE, CAPILLARY
Glucose-Capillary: 129 mg/dL — ABNORMAL HIGH (ref 70–99)
Glucose-Capillary: 131 mg/dL — ABNORMAL HIGH (ref 70–99)
Glucose-Capillary: 146 mg/dL — ABNORMAL HIGH (ref 70–99)
Glucose-Capillary: 158 mg/dL — ABNORMAL HIGH (ref 70–99)
Glucose-Capillary: 167 mg/dL — ABNORMAL HIGH (ref 70–99)
Glucose-Capillary: 178 mg/dL — ABNORMAL HIGH (ref 70–99)

## 2023-01-25 LAB — MAGNESIUM: Magnesium: 1.8 mg/dL (ref 1.7–2.4)

## 2023-01-25 LAB — PHOSPHORUS: Phosphorus: 2.1 mg/dL — ABNORMAL LOW (ref 2.5–4.6)

## 2023-01-25 MED ORDER — POLYETHYLENE GLYCOL 3350 17 G PO PACK: 17.0000 g | PACK | Freq: Every day | ORAL | Status: DC | PRN

## 2023-01-25 MED ORDER — FUROSEMIDE 10 MG/ML IJ SOLN
20.0000 mg | Freq: Two times a day (BID) | INTRAMUSCULAR | Status: DC
  Administered 2023-01-25 – 2023-01-27 (×5): 20 mg via INTRAVENOUS
  Filled 2023-01-25 (×5): qty 2

## 2023-01-25 MED ORDER — FREE WATER: 100.0000 mL | Freq: Three times a day (TID) | Status: DC

## 2023-01-25 NOTE — Progress Notes (Signed)
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  Started on Vanc for PNA about 3 days ago.  Tachycardic today.  Baseline mental status: alert but non verbal.  Sacral decubitus ulcer.  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 Coli UTI and bilateral pneumonia on 12/28/2022.  Initially febrile and less responsive at [facility] and requiring increased O2 on trach collar.   Significant Hospital Events: Including procedures, antibiotic start and stop dates in addition to other pertinent events   1/22 admit to ICU septic shock PNA, looks very dehydrated as well   Interim History / Subjective:  Patient is sleeping this morning.  He is on trach collar since 7 AM  Objective   Blood pressure 92/60, pulse 74, temperature 97.8 F (36.6 C), temperature source Axillary, resp. rate 18, height 5\' 11"  (1.803 m), weight 83.1 kg, SpO2 97 %.    Vent Mode: PSV FiO2 (%):  [30 %-40 %] 30 % PEEP:  [5 cmH20] 5 cmH20 Pressure Support:  [8 cmH20] 8 cmH20   Intake/Output Summary (Last 24 hours) at 01/25/2023 0840 Last data filed at 01/25/2023 0622 Gross per 24 hour  Intake 4414.07 ml  Output 2100 ml  Net 2314.07 ml    Filed Weights   01/19/23 0310 01/20/23 0356 01/22/23 0325  Weight: 79.9 kg 79.9 kg 83.1 kg    Examination: General: Chronically ill trach vent.  In no acute  distress HENT: Tracheostomy in place Lungs: Coarse. No wheezing. Mild rhonchi heard bilaterally Cardiovascular: rrr Abdomen: + PEG, soft  Extremities: Symmetrical muscle wasting. +1 edema of bilat LE Neuro: sleeping.   Blood culture: - 5 days UA: No pyuria Urine culture: > 100k Yeast MRSA: Negative Respiratory culture: Pseudomonas resistant to pending.  Serratia resistant to cefazolin and ciprofloxacin.  Resolved Hospital Problem list     Assessment & Plan:  Septic shock, unclear etiology at this time (PNA, UTI, bacteremia with PICC  Possibly hypovolemic shock superimposed  The most likely source is left basilar pneumonia.  Blood culture negative to date.  UC grew yeast. MRSA negative.  Respiratory culture grew Serratia and Pseudomonas resistant to penem. -Completed 10 days of meropenem -Continue midodrine 10 mg TID. 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  Continue weaning as tolerated  Acute metabolic/infectious encephalopathy History of post TBI seizure Baseline is alert, awake, track and able to use his right side extremities.   -Continue Keppra indefinitely  CAD status post CABG Echo 06/2022: EF 55-60%, LVH, RV normal, atheroma plague of aortic root and ascending aorta.  I/O + 8.7 -Switch Lasix from p.o. to IV 40 mg twice daily -Strict I/O -Hold metop for now with low normal blood pressure  Hyponatremia -Decrease free  water flushes to 100 cc Q8h -Switch p.o. Lasix to IV Lasix 20 mg twice daily  Hypothyroidism  Not sure why he is on both T3 and T4 supplementation at home. TSH and T4 WNL.  -Continue home levothyroxine and liothyronine  Chronic malnutrition -Continue tube feed  T2DM -Semglee 20u daily  Best Practice (right click and "Reselect all SmartList Selections" daily)   Diet/type: tubefeeds DVT prophylaxis: LMWH GI  prophylaxis: H2B Lines: N/A  Foley:  Yes, still needed Code Status:  DNR Last date of multidisciplinary goals of care discussion [1/28]  Labs   CBC: Recent Labs  Lab 01/21/23 0404 01/21/23 0956 01/22/23 0206 01/23/23 0639 01/24/23 1220 01/25/23 0216  WBC 7.6  --  7.7 7.8 12.9* 8.4  HGB 10.2* 10.2* 11.2* 11.8* 12.3* 11.1*  HCT 34.6* 30.0* 36.4* 37.3* 37.1* 34.1*  MCV 96.6  --  94.5 92.8 88.1 90.0  PLT 232  --  202 227 261 380     Basic Metabolic Panel: Recent Labs  Lab 01/19/23 0317 01/19/23 1330 01/19/23 1700 01/20/23 0349 01/21/23 0404 01/21/23 0956 01/22/23 0206 01/23/23 0639 01/24/23 1220 01/25/23 0216  NA 153*  --   --  153* 147* 147* 142 137 132* 131*  K 3.0*  --   --  3.7 3.9 3.9 4.4 4.3 5.1 3.9  CL 117*  --   --  119* 113*  --  111 106 99 97*  CO2 29  --   --  29 28  --  26 22 21* 24  GLUCOSE 193*  --   --  208* 202*  --  164* 120* 148* 150*  BUN 45*  --   --  32* 18  --  14 12 13 10   CREATININE 0.48*  --   --  0.44* 0.34*  --  0.32* 0.37* 0.37* 0.37*  CALCIUM 8.7*  --   --  8.6* 8.5*  --  8.8* 8.8* 9.1 8.7*  MG 2.2 2.1 1.9 2.4  --   --   --   --   --  1.8  PHOS 3.2 2.4* 4.1 1.9*  --   --   --   --   --  2.1*    GFR: Estimated Creatinine Clearance: 90.2 mL/min (A) (by C-G formula based on SCr of 0.37 mg/dL (L)). Recent Labs  Lab 01/18/23 1139 01/18/23 1602 01/18/23 2000 01/19/23 0317 01/20/23 0349 01/22/23 0206 01/23/23 0639 01/24/23 1220 01/25/23 0216  PROCALCITON 0.66  --   --  0.58  --   --   --   --   --   WBC  --   --   --  8.2   < > 7.7 7.8 12.9* 8.4  LATICACIDVEN  --  1.9 1.7  --   --   --   --   --   --    < > = values in this interval not displayed.     Liver Function Tests: No results for input(s): "AST", "ALT", "ALKPHOS", "BILITOT", "PROT", "ALBUMIN" in the last 168 hours.  No results for input(s): "LIPASE", "AMYLASE" in the last 168 hours. Recent Labs  Lab 01/20/23 1147  AMMONIA 31     ABG    Component Value  Date/Time   PHART 7.455 (H) 01/21/2023 0956   PCO2ART 37.9 01/21/2023 0956   PO2ART 87 01/21/2023 0956   HCO3 26.7 01/21/2023 0956   TCO2 28 01/21/2023 0956   O2SAT 97 01/21/2023 0956     Coagulation Profile: No  results for input(s): "INR", "PROTIME" in the last 168 hours.   Cardiac Enzymes: No results for input(s): "CKTOTAL", "CKMB", "CKMBINDEX", "TROPONINI" in the last 168 hours.  HbA1C: Hgb A1c MFr Bld  Date/Time Value Ref Range Status  12/29/2022 12:07 AM 9.2 (H) 4.8 - 5.6 % Final    Comment:    (NOTE)         Prediabetes: 5.7 - 6.4         Diabetes: >6.4         Glycemic control for adults with diabetes: <7.0   06/29/2022 06:33 AM 7.5 (H) 4.8 - 5.6 % Final    Comment:    (NOTE) Pre diabetes:          5.7%-6.4%  Diabetes:              >6.4%  Glycemic control for   <7.0% adults with diabetes     CBG: Recent Labs  Lab 01/24/23 1518 01/24/23 1938 01/24/23 2308 01/25/23 0329 01/25/23 0741  GLUCAP 137* 123* 155* 158* 167*    CRITICAL CARE Doran Stabler, DO Internal Medicine Residency My pager: [phone]

## 2023-01-25 NOTE — Progress Notes (Addendum)
eLink Physician-Brief Progress Note Patient Name: Joshua Haley DOB: 28-Nov-1951 MRN: [id number]   Date of Service  01/25/2023  HPI/Events of Note  Tele notified bedside RN for STEMI EKG with no ST elevations or TWI  eICU Interventions  Trend troponin If elevated, would prefer med mgmt >aggressive intervention in setting of [place] conversations during the day     Intervention Category Intermediate Interventions: Arrhythmia - evaluation and management  Christine Morton Rodman Pickle 01/25/2023, 12:55 AM

## 2023-01-26 DIAGNOSIS — A419 Sepsis, unspecified organism: Secondary | ICD-10-CM | POA: Diagnosis not present

## 2023-01-26 LAB — CBC
HCT: 39.5 % (ref 39.0–52.0)
Hemoglobin: 12.8 g/dL — ABNORMAL LOW (ref 13.0–17.0)
MCH: 29.2 pg (ref 26.0–34.0)
MCHC: 32.4 g/dL (ref 30.0–36.0)
MCV: 90.2 fL (ref 80.0–100.0)
Platelets: 395 10*3/uL (ref 150–400)
RBC: 4.38 MIL/uL (ref 4.22–5.81)
RDW: 18 % — ABNORMAL HIGH (ref 11.5–15.5)
WBC: 8.3 10*3/uL (ref 4.0–10.5)
nRBC: 0 % (ref 0.0–0.2)

## 2023-01-26 LAB — GLUCOSE, CAPILLARY
Glucose-Capillary: 144 mg/dL — ABNORMAL HIGH (ref 70–99)
Glucose-Capillary: 152 mg/dL — ABNORMAL HIGH (ref 70–99)
Glucose-Capillary: 153 mg/dL — ABNORMAL HIGH (ref 70–99)
Glucose-Capillary: 155 mg/dL — ABNORMAL HIGH (ref 70–99)
Glucose-Capillary: 168 mg/dL — ABNORMAL HIGH (ref 70–99)
Glucose-Capillary: 174 mg/dL — ABNORMAL HIGH (ref 70–99)

## 2023-01-26 LAB — BASIC METABOLIC PANEL
Anion gap: 11 (ref 5–15)
BUN: 15 mg/dL (ref 8–23)
CO2: 25 mmol/L (ref 22–32)
Calcium: 9 mg/dL (ref 8.9–10.3)
Chloride: 97 mmol/L — ABNORMAL LOW (ref 98–111)
Creatinine, Ser: 0.31 mg/dL — ABNORMAL LOW (ref 0.61–1.24)
GFR, Estimated: >60 mL/min (ref >60)
Glucose, Bld: 144 mg/dL — ABNORMAL HIGH (ref 70–99)
Potassium: 3.9 mmol/L (ref 3.5–5.1)
Sodium: 133 mmol/L — ABNORMAL LOW (ref 135–145)

## 2023-01-26 NOTE — Plan of Care (Signed)
  Problem: Nutritional: Goal: Maintenance of adequate nutrition will improve Outcome: Progressing   

## 2023-01-26 NOTE — IPAL (Signed)
I spoke to patient's wife, Hope, on the phone.  I updated her on patient's current progress.  I am under the impression that family is not ready to make decision to transition to comfort care, which she agrees.  She states that patient's son, Corene Cornea, may come by today but family will definitely be here tomorrow.  She asks to give her tonight for family discussion and will give me the final answer tomorrow morning.

## 2023-01-26 NOTE — Progress Notes (Signed)
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  Started on Vanc for PNA about 3 days ago.  Tachycardic today.  Baseline mental status: alert but non verbal.  Sacral decubitus ulcer.  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 Coli UTI and bilateral pneumonia on 12/28/2022.  Initially febrile and less responsive at [facility] and requiring increased O2 on trach collar.   Significant Hospital Events: Including procedures, antibiotic start and stop dates in addition to other pertinent events   1/22 admit to ICU septic shock PNA, looks very dehydrated as well   Interim History / Subjective:  Patient is somnolent but awakes to verbal stimulation.  He does track and follow commands. Objective   Blood pressure 115/60, pulse 86, temperature 97.7 F (36.5 C), temperature source Oral, resp. rate 15, height 5\' 11"  (1.803 m), weight 81.2 kg, SpO2 95 %.    FiO2 (%):  [30 %] 30 %   Intake/Output Summary (Last 24 hours) at 01/26/2023 0908 Last data filed at 01/26/2023 0626 Gross per 24 hour  Intake 1260 ml  Output 2600 ml  Net -1340 ml    Filed Weights   01/20/23 0356 01/22/23 0325 01/26/23 0437  Weight: 79.9 kg 83.1 kg 81.2 kg    Examination: General: Chronically ill trach vent.  In no acute distress HENT: Tracheostomy in place Lungs: Coarse. No wheezing.   Cardiovascular: rrr Abdomen: + PEG, soft  Extremities: Symmetrical muscle wasting. +1 edema of bilat LE Neuro: Awake to verbal stimulation.  Track, follow commands.  Blood culture: - 5 days UA: No pyuria Urine culture: > 100k Yeast MRSA: Negative Respiratory culture: Pseudomonas resistant to pending.  Serratia resistant to cefazolin and ciprofloxacin.  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  Continue as tolerated.  Left basilar pneumonia Blood culture negative to date.  UC grew yeast. MRSA negative.  Respiratory culture grew Serratia and Pseudomonas resistant to penem. -Completed 10 days of meropenem -Continue midodrine 10 mg TID. He is off of levophed  Acute metabolic/infectious encephalopathy History of post TBI seizure Baseline is alert, awake, track and able to use his right side extremities.   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  I/O + 7.5 -Continue Lasix IV 20 mg twice daily -Strict I/O -Hold metop for now with low normal blood pressure  Hyponatremia -Stop free water flushes -Continue IV Lasix  Hypothyroidism  Not sure why he is on both T3 and T4 supplementation at home. TSH and T4 WNL.  -Continue home levothyroxine and liothyronine  Chronic malnutrition -Continue tube feed  T2DM -Semglee 20u daily  Best Practice (right click and "Reselect all SmartList Selections" daily)   Diet/type: tubefeeds DVT prophylaxis: LMWH GI prophylaxis: H2B Lines: N/A  Foley:  Yes, still needed Code Status:  DNR Last date of multidisciplinary goals of care discussion [1/28]  Labs   CBC: Recent Labs  Lab 01/22/23 0206 01/23/23 0639 01/24/23 1220 01/25/23 0216 01/26/23 0230  WBC  7.7 7.8 12.9* 8.4 8.3  HGB 11.2* 11.8* 12.3* 11.1* 12.8*  HCT 36.4* 37.3* 37.1* 34.1* 39.5  MCV 94.5 92.8 88.1 90.0 90.2  PLT 202 227 261 380 395     Basic Metabolic Panel: Recent Labs  Lab 01/19/23 1330 01/19/23 1700 01/20/23 0349 01/21/23 0404 01/22/23 0206 01/23/23 0639 01/24/23 1220 01/25/23 0216 01/26/23 0230  NA  --   --  153*   < > 142 137 132* 131* 133*  K  --   --  3.7   < > 4.4 4.3 5.1 3.9 3.9  CL  --   --  119*   < > 111 106 99 97* 97*  CO2  --   --  29   < > 26 22 21* 24 25  GLUCOSE  --   --  208*   < > 164* 120* 148* 150* 144*  BUN  --   --  32*   < > 14 12 13 10 15   CREATININE  --   --  0.44*   < > 0.32* 0.37* 0.37* 0.37* 0.31*  CALCIUM  --   --  8.6*   < > 8.8* 8.8* 9.1 8.7* 9.0  MG 2.1 1.9 2.4  --   --   --   --  1.8  --   PHOS 2.4* 4.1 1.9*  --   --   --   --  2.1*  --    < > = values in this interval not displayed.    GFR: Estimated Creatinine Clearance: 90.2 mL/min (A) (by C-G formula based on SCr of 0.31 mg/dL (L)). Recent Labs  Lab 01/23/23 0639 01/24/23 1220 01/25/23 0216 01/26/23 0230  WBC 7.8 12.9* 8.4 8.3     Liver Function Tests: No results for input(s): "AST", "ALT", "ALKPHOS", "BILITOT", "PROT", "ALBUMIN" in the last 168 hours.  No results for input(s): "LIPASE", "AMYLASE" in the last 168 hours. Recent Labs  Lab 01/20/23 1147  AMMONIA 31     ABG    Component Value Date/Time   PHART 7.455 (H) 01/21/2023 0956   PCO2ART 37.9 01/21/2023 0956   PO2ART 87 01/21/2023 0956   HCO3 26.7 01/21/2023 0956   TCO2 28 01/21/2023 0956   O2SAT 97 01/21/2023 0956     Coagulation Profile: No results for input(s): "INR", "PROTIME" in the last 168 hours.   Cardiac Enzymes: No results for input(s): "CKTOTAL", "CKMB", "CKMBINDEX", "TROPONINI" in the last 168 hours.  HbA1C: Hgb A1c MFr Bld  Date/Time Value Ref Range Status  12/29/2022 12:07 AM 9.2 (H) 4.8 - 5.6 % Final    Comment:    (NOTE)         Prediabetes: 5.7 - 6.4          Diabetes: >6.4         Glycemic control for adults with diabetes: <7.0   06/29/2022 06:33 AM 7.5 (H) 4.8 - 5.6 % Final    Comment:    (NOTE) Pre diabetes:          5.7%-6.4%  Diabetes:              >  6.4%  Glycemic control for   <7.0% adults with diabetes     CBG: Recent Labs  Lab 01/25/23 1654 01/25/23 1956 01/25/23 2334 01/26/23 0336 01/26/23 0731  GLUCAP 146* 129* 131* 152* Mulino Gaylan Gerold, DO Internal Medicine Residency My pager: [phone]

## 2023-01-26 NOTE — Progress Notes (Signed)
Nutrition Follow-up  DOCUMENTATION CODES:  Not applicable  INTERVENTION:  Continue TF via PEG: Glucerna 1.5 at 60 ml/h (1440 ml per day) Provides 2160 kcal, 119 gm protein, 1093 ml free water daily 1 packet Juven BID, each packet provides 95 calories, 2.5 grams of protein (collagen), and 9.8 grams of carbohydrate (3 grams sugar); also contains 7 grams of L-arginine and L-glutamine, 300 mg vitamin C, 15 mg vitamin E, 1.2 mcg vitamin B-12, 9.5 mg zinc, 200 mg calcium, and 1.5 g  Calcium Beta-hydroxy-Beta-methylbutyrate to support wound healing  NUTRITION DIAGNOSIS:  Inadequate oral intake related to inability to eat as evidenced by  (chronic PEG tube dependent). - remains applicable   GOAL:  Patient will meet greater than or equal to 90% of their needs - being met with TF  MONITOR:  Labs, Vent status, TF tolerance, Weight trends  REASON FOR ASSESSMENT:  Consult Enteral/tube feeding initiation and management  ASSESSMENT:  Pt with hx of TBI s/p PEG and trach, DM type 2 and HTN presented to ED from [facility] with AMS  Patient resting in bed at the time of assessment, eyes closed. Tolerating trach collar at this time. Unable to communicate. TF infusing at goal of 60 via PEG. Some discussion among family of transitioning pt to comfort care as he will likely not be able to wean from trach collar. Some family members hesitant to the idea.  Discussed in rounds, pt stable to be discharged back to facility.   Temp (24hrs), Avg:98.1 F (36.7 C), Min:97.6 F (36.4 C), Max:99.1 F (37.3 C)   Intake/Output Summary (Last 24 hours) at 01/26/2023 1427 Last data filed at 01/26/2023 1200 Gross per 24 hour  Intake 1143 ml  Output 3550 ml  Net -2407 ml  Net IO Since Admission: 6,840 mL [01/26/23 1427]  Nutritionally Relevant Medications: Scheduled Meds:  famotidine  20 mg Per Tube BID   ferrous sulfate  300 mg Per Tube BID   fiber supplement (BANATROL TF)  60 mL Per Tube BID    furosemide  20 mg Intravenous BID   insulin aspart  0-15 Units Subcutaneous Q4H   insulin glargine-yfgn  20 Units Subcutaneous Daily   Continuous Infusions:  feeding supplement (GLUCERNA 1.5 CAL) 60 mL/hr at 01/26/23 1200   PRN Meds: polyethylene glycol  Labs Reviewed: Na 133, chloride 97 CBG ranges from 129-174 mg/dL over the last 24 hours HgbA1c 9.2%   NUTRITION - FOCUSED PHYSICAL EXAM: Flowsheet Row Most Recent Value  Orbital Region Mild depletion  Upper Arm Region No depletion  Thoracic and Lumbar Region No depletion  Buccal Region No depletion  Temple Region Moderate depletion  Clavicle Bone Region No depletion  Clavicle and Acromion Bone Region Mild depletion  Scapular Bone Region Moderate depletion  Dorsal Hand No depletion  Patellar Region Severe depletion  Anterior Thigh Region Severe depletion  Posterior Calf Region Severe depletion  Edema (RD Assessment) Mild  Hair Reviewed  Eyes Reviewed  Mouth Reviewed  Skin Reviewed  Nails Reviewed    Diet Order:   Diet Order             Diet NPO time specified  Diet effective now                   EDUCATION NEEDS:   Not appropriate for education at this time  Skin:  Skin Assessment: Reviewed RN Assessment Stage 1: Left upper back Stage 2: Right Hip Stage 3: Left sacrum Unstageable: Left ischial tuberosity  Last BM:  1/30 - type 7  Height:   Ht Readings from Last 1 Encounters:  01/17/23 5\' 11"  (1.803 m)    Weight:   Wt Readings from Last 1 Encounters:  01/26/23 81.2 kg    Ideal Body Weight:  78.2 kg  BMI:  Body mass index is 24.97 kg/m.  Estimated Nutritional Needs:  Kcal:  2200-2500 kcal/d Protein:  110-130g/d Fluid:  2.2-2.5L/d    Ranell Patrick, RD, LDN Clinical Dietitian RD pager # available in [place]  After hours/weekend pager # available in [facility]

## 2023-01-27 DIAGNOSIS — A419 Sepsis, unspecified organism: Secondary | ICD-10-CM | POA: Diagnosis not present

## 2023-01-27 LAB — GLUCOSE, CAPILLARY
Glucose-Capillary: 141 mg/dL — ABNORMAL HIGH (ref 70–99)
Glucose-Capillary: 143 mg/dL — ABNORMAL HIGH (ref 70–99)
Glucose-Capillary: 183 mg/dL — ABNORMAL HIGH (ref 70–99)

## 2023-01-27 LAB — BASIC METABOLIC PANEL
Anion gap: 8 (ref 5–15)
BUN: 20 mg/dL (ref 8–23)
CO2: 30 mmol/L (ref 22–32)
Calcium: 9.1 mg/dL (ref 8.9–10.3)
Chloride: 95 mmol/L — ABNORMAL LOW (ref 98–111)
Creatinine, Ser: 0.37 mg/dL — ABNORMAL LOW (ref 0.61–1.24)
GFR, Estimated: >60 mL/min (ref >60)
Glucose, Bld: 142 mg/dL — ABNORMAL HIGH (ref 70–99)
Potassium: 4 mmol/L (ref 3.5–5.1)
Sodium: 133 mmol/L — ABNORMAL LOW (ref 135–145)

## 2023-01-27 MED ORDER — MIDODRINE HCL 10 MG PO TABS: 10.0000 mg | ORAL_TABLET | Freq: Three times a day (TID) | ORAL | Status: DC

## 2023-01-27 MED ORDER — LEVETIRACETAM 500 MG PO TABS: 500.0000 mg | ORAL_TABLET | Freq: Two times a day (BID) | ORAL | Status: AC

## 2023-01-27 MED ORDER — METOPROLOL TARTRATE 12.5 MG HALF TABLET
12.5000 mg | ORAL_TABLET | Freq: Two times a day (BID) | ORAL | Status: DC
  Administered 2023-01-27: 12.5 mg
  Filled 2023-01-27: qty 1

## 2023-01-27 NOTE — Progress Notes (Signed)
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  Started on Vanc for PNA about 3 days ago.  Tachycardic today.  Baseline mental status: alert but non verbal.  Sacral decubitus ulcer.  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 Coli UTI and bilateral pneumonia on 12/28/2022.  Initially febrile and less responsive at [facility] and requiring increased O2 on trach collar.   Significant Hospital Events: Including procedures, antibiotic start and stop dates in addition to other pertinent events   1/22 admit to ICU septic shock PNA, looks very dehydrated as well   Interim History / Subjective:  Patient is alert, awake and able to track this morning.  He is not in any acute distress. Objective   Blood pressure 99/64, pulse 90, temperature 98.6 F (37 C), temperature source Oral, resp. rate 16, height 5\' 11"  (1.803 m), weight 79 kg, SpO2 (!) 86 %.    FiO2 (%):  [30 %] 30 %   Intake/Output Summary (Last 24 hours) at 01/27/2023 0819 Last data filed at 01/27/2023 0600 Gross per 24 hour  Intake 1579 ml  Output 1760 ml  Net -181 ml    Filed Weights   01/22/23 0325 01/26/23 0437 01/27/23 0337  Weight: 83.1 kg 81.2 kg 79 kg    Examination: General: Chronically ill trach vent.  In no acute distress HENT: Tracheostomy in place Lungs: Coarse. No wheezing.   Cardiovascular: rrr Abdomen: + PEG, soft  Extremities: Symmetrical muscle wasting. Trace edema of bilat LE Neuro: Awake to verbal stimulation.  Track.   Blood culture: - 5 days UA: No pyuria Urine culture: > 100k Yeast MRSA: Negative Respiratory culture: Pseudomonas resistant to pending.  Serratia resistant to cefazolin and ciprofloxacin.  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  Continue as tolerated.  Left basilar pneumonia Respiratory culture grew Serratia and Pseudomonas resistant to penem. -Completed 10 days of meropenem -Continue midodrine 10 mg TID. He is off of levophed  Acute metabolic/infectious encephalopathy History of post TBI seizure Baseline is alert, awake, track and able to use his right side extremities.   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  -Continue Lasix IV 20 mg twice daily.  He is +7 L but his weight is very close to baseline.  Since his respiratory status is stable, will continue gentle diuresis. -Strict I/O -Resume metoprolol tartrate 12.5 mg twice daily  Hyponatremia -Stop free water flushes -Continue IV Lasix  Hypothyroidism  Not sure why he is on both T3 and T4 supplementation at home. TSH and T4 WNL.  -Continue home levothyroxine and liothyronine  Chronic malnutrition -Continue tube feed  T2DM -Semglee 20u daily  GOC Even though family has any intention for transition to comfort care, it seems like they are not ready to make this decision.  His eldest son, Corene Cornea did not come to bedside.  I will get the final decision from family but I think will be likely to transfer back to [facility] to allow more time for discussion.  Best Practice (right click and  "Reselect all SmartList Selections" daily)   Diet/type: tubefeeds DVT prophylaxis: LMWH GI prophylaxis: H2B Lines: N/A  Foley:  Yes, still needed Code Status:  DNR Last date of multidisciplinary goals of care discussion [1/28]  Labs   CBC: Recent Labs  Lab 01/22/23 0206 01/23/23 0639 01/24/23 1220 01/25/23 0216 01/26/23 0230  WBC 7.7 7.8 12.9* 8.4 8.3  HGB 11.2* 11.8* 12.3* 11.1* 12.8*  HCT 36.4* 37.3* 37.1* 34.1* 39.5  MCV 94.5 92.8 88.1 90.0 90.2  PLT 202 227 261 380 395     Basic Metabolic Panel: Recent Labs  Lab 01/23/23 0639 01/24/23 1220 01/25/23 0216 01/26/23 0230 01/27/23 0223  NA 137 132* 131* 133* 133*  K 4.3 5.1 3.9 3.9 4.0  CL 106 99 97* 97* 95*  CO2 22 21* 24 25 30   GLUCOSE 120* 148* 150* 144* 142*  BUN 12 13 10 15 20   CREATININE 0.37* 0.37* 0.37* 0.31* 0.37*  CALCIUM 8.8* 9.1 8.7* 9.0 9.1  MG  --   --  1.8  --   --   PHOS  --   --  2.1*  --   --     GFR: Estimated Creatinine Clearance: 90.2 mL/min (A) (by C-G formula based on SCr of 0.37 mg/dL (L)). Recent Labs  Lab 01/23/23 0639 01/24/23 1220 01/25/23 0216 01/26/23 0230  WBC 7.8 12.9* 8.4 8.3     Liver Function Tests: No results for input(s): "AST", "ALT", "ALKPHOS", "BILITOT", "PROT", "ALBUMIN" in the last 168 hours.  No results for input(s): "LIPASE", "AMYLASE" in the last 168 hours. Recent Labs  Lab 01/20/23 1147  AMMONIA 31     ABG    Component Value Date/Time   PHART 7.455 (H) 01/21/2023 0956   PCO2ART 37.9 01/21/2023 0956   PO2ART 87 01/21/2023 0956   HCO3 26.7 01/21/2023 0956   TCO2 28 01/21/2023 0956   O2SAT 97 01/21/2023 0956     Coagulation Profile: No results for input(s): "INR", "PROTIME" in the last 168 hours.   Cardiac Enzymes: No results for input(s): "CKTOTAL", "CKMB", "CKMBINDEX", "TROPONINI" in the last 168 hours.  HbA1C: Hgb A1c MFr Bld  Date/Time Value Ref Range Status  12/29/2022 12:07 AM 9.2 (H) 4.8 - 5.6 % Final    Comment:    (NOTE)          Prediabetes: 5.7 - 6.4         Diabetes: >6.4         Glycemic control for adults with diabetes: <7.0   06/29/2022 06:33 AM 7.5 (H) 4.8 - 5.6 % Final    Comment:    (NOTE) Pre diabetes:          5.7%-6.4%  Diabetes:              >6.4%  Glycemic control for   <7.0% adults with diabetes     CBG: Recent Labs  Lab 01/26/23 1536 01/26/23 1919 01/26/23 2337 01/27/23 0333 01/27/23 0734  GLUCAP 168* 144* 153* 141* Bearden, DO Internal Medicine  Residency My pager: [phone]

## 2023-01-27 NOTE — Discharge Summary (Signed)
Name: Joshua Haley MRN: [id number] DOB: 10/13/1951 72 y.o. PCP: Townsend Roger, MD  Date of Admission: 01/17/2023 11:16 PM Date of Discharge: 01/27/2023 Attending Physician: Juanito Doom, MD  Discharge Diagnosis: 1. Principal Problem:   Sepsis due to undetermined organism [facility]) Active Problems:   Hospital-acquired pneumonia   Acute respiratory failure with hypoxia (HCC)   Septic shock/hypovolemic shock   Acute metabolic/infectious encephalopathy   Seizure   Hypernatremia   CAD status post CABG   Hypothyroidism   Chronic malnutrition  Discharge Medications: Allergies as of 01/27/2023       Reactions   Morphine And Related    Penicillins Other (See Comments)   Has tolerated Cefepime and Ceftriaxone   Statins Nausea And Vomiting        Medication List     STOP taking these medications    levETIRAcetam 100 MG/ML solution Commonly known as: KEPPRA Replaced by: levETIRAcetam 500 MG tablet   meropenem 1 g in sodium chloride 0.9 % 100 mL   vancomycin 1-5 GM/200ML-% Soln Commonly known as: VANCOCIN       TAKE these medications    amantadine 50 MG/5ML solution Commonly known as: SYMMETREL Place 100 mg into feeding tube daily.   ascorbic acid 500 MG tablet Commonly known as: VITAMIN C Place 500 mg into feeding tube daily. Give 500mg  per tube daily   Citrucel 500 MG Tabs Generic drug: Methylcellulose (Laxative) Place 500 mg into feeding tube 3 (three) times daily.   enoxaparin 40 MG/0.4ML injection Commonly known as: LOVENOX Inject 40 mg into the skin daily.   feeding supplement (GLUCERNA 1.5 CAL) Liqd Place 60 mL/hr into feeding tube See admin instructions. Give 20mL/hr per tube by shift   Arginaid Pack Place 1 packet into feeding tube 2 (two) times daily. Arginaid 4.5gram-156mg /9.2 gram oral powder packet   ferrous sulfate 300 (60 Fe) MG/5ML syrup Place 300 mg into feeding tube 2 (two) times daily.   free water Soln Place 300 mLs into  feeding tube every 4 (four) hours.   furosemide 20 MG tablet Commonly known as: LASIX Place 20 mg into feeding tube 2 (two) times daily.   insulin aspart 100 UNIT/ML injection Commonly known as: novoLOG insulin aspart (novoLOG) injection 0-15 Units 0-15 Units, Subcutaneous, Every 4 hours,  CBG < 70: Implement Hypoglycemia Standing Orders and refer to Hypoglycemia Standing Orders sidebar report CBG 70 - 120: 0 units CBG 121 - 150: 2 units CBG 151 - 200: 3 units CBG 201 - 250: 5 units CBG 251 - 300: 8 units CBG 301 - 350: 11 units CBG 351 - 400: 15 units CBG > 400: call MD What changed:  how much to take how to take this when to take this additional instructions   insulin glargine 100 UNIT/ML injection Commonly known as: LANTUS Inject 0.25 mLs (25 Units total) into the skin daily. What changed: how much to take   lansoprazole 30 MG capsule Commonly known as: PREVACID Give 1 capsule (30mg ) per G tube daily   leptospermum manuka honey Pste paste Apply 1 Application topically daily.   levETIRAcetam 500 MG tablet Commonly known as: KEPPRA Place 1 tablet (500 mg total) into feeding tube 2 (two) times daily. Replaces: levETIRAcetam 100 MG/ML solution   levothyroxine 100 MCG tablet Commonly known as: SYNTHROID Place 100 mcg into feeding tube daily. Give 122mcg per tube daily   liothyronine 5 MCG tablet Commonly known as: CYTOMEL Place 7.5 mcg into feeding tube every 12 (twelve)  hours. Give one and one half tablet (7.5mg ) per tube every 12 hours   metoprolol tartrate 25 MG tablet Commonly known as: LOPRESSOR Place 12.5 mg into feeding tube 2 (two) times daily.   midodrine 10 MG tablet Commonly known as: PROAMATINE 1 tablet (10 mg total) by Per NG tube route 3 (three) times daily with meals. What changed:  medication strength how much to take how to take this   modafinil 100 MG tablet Commonly known as: PROVIGIL Place 100 mg into feeding tube daily.   multivitamin  Liqd Place 15 mLs into feeding tube daily. What changed:  how much to take additional instructions   omega-3 acid ethyl esters 1 g capsule Commonly known as: LOVAZA Place 1 g into feeding tube daily. Give 1 g per tube daily   ondansetron 4 MG disintegrating tablet Commonly known as: ZOFRAN-ODT 4 mg every 6 (six) hours as needed for nausea or vomiting.   polyethylene glycol 17 g packet Commonly known as: MIRALAX / GLYCOLAX Give 17g per tube daily as needed for constipation   Pro-Stat AWC Liqd Place 30 mLs into feeding tube daily.   sertraline 25 MG tablet Commonly known as: ZOLOFT Place 25 mg into feeding tube daily. Give 25mg  per tube daily   sodium chloride HYPERTONIC 3 % nebulizer solution Take 4 mLs by nebulization 2 (two) times daily.   sodium chloride irrigation 0.9 % irrigation Irrigate with 60 mLs as directed every 8 (eight) hours. Irrigate indwelling foley with 52mL sterile saline every 8 hours.               Discharge Care Instructions  (From admission, onward)           Start     Ordered   01/27/23 0000  Discharge wound care:       Comments: Instruction above   01/27/23 1312            Disposition and follow-up:   Joshua Haley was discharged from [facility] in Stable condition.  At the hospital follow up visit please address:  1.   Acute hypoxic respiratory failure Left basilar pneumonia -Finished 10 days of meropenem -Continue trach collar  Septic/hypovolemic shock -Midodrine dose increased to 10 mg 3 times daily  Seizure History of TBI -Continue Keppra 500 mg twice daily indefinitely  Hypernatremia -Consider free water flushes to avoid hypernatremia  CAD s/p CABG Lower extremity edema Hypoalbuminuria -Continue gentle diuresis with Lasix 20 mg twice daily. -Home metoprolol was resumed.  2.  Labs / imaging needed at time of follow-up: NA  3.  Pending labs/ test needing follow-up: CBC, BMP   Hospital  Course by problem list:  Acute hypoxic respiratory failure required mechanical ventilator Left basilar pneumonia Septic/Hypovolemic shock 72 year old male living with TBI, trach/PEG dependent, chronic respiratory failure with trach collar dependent 20% FiO2 at baseline, CAD status post CABG, hypertension, type 2 diabetes, seizure, who was transferred from [facility] to [facility] for treatment of acute encephalopathy and hypotension.  Blood pressure on admission was 82/62 with fever 103.3.  He was tachycardic in the 105.  He had leukocytosis of 12.7 with sodium of 162.  Chest x-ray showed left basilar atelectasis/infiltrate.  Patient received 2 days of vancomycin and 4 days of meropenem at [facility] prior to admission. His blood culture remains negative throughout the admission.  His UA did not show pyuria.  Urine culture only grew yeast.  His Foley was exchanged on admission.  Respiratory culture grew Serratia  and Pseudomonas resistant to imipenem.  We consulted infectious disease team for assistance with antibiotics.  They recommended continue meropenem to finish 10 days course since patient was clinically improving on meropenem.  Patient was weaned off Levophed.  His midodrine was increased to 10 mg 3 times daily.  His fever trended down significantly and leukocytosis resolved.  Patient initially required mechanical ventilator but was able to wean down to trach collar.    Acute metabolic/infectious encephalopathy Seizure On admission patient was somnolent and not arousable.  This was thought due to his infection and dehydration evidenced on his hypernatremia.  His mentation slowly improving with antibiotics and free water flushes but he developed an episode of seizure like activity on 1/27.  Neurology was consulted and recommended Keppra.  Patient was on [place] in the past but was discontinued in December.  With his history of TBI, patient will need to be on Keppra indefinitely.  His mental status  returned to baseline on the day of discharge  History of CAD status post CABG Lower extremity edema Hypoalbuminuria His home Lasix was held on admission due to septic shock.  Patient was given free water flushes to correct his hypernatremia.  We resumed his home Lasix when his blood pressure improved.  He received a few doses of IV Lasix and was transitioned to home dose on discharge.  Goal of care During this admission, patient's wife and family expressed the wish to transition to comfort care.  However after multiple family discussion today, they think that they are not ready to make this decision.  Encouraged ongoing goals of care conversation at [place].  Pertinent Labs, Studies, and Procedures:     Latest Ref Rng & Units 01/26/2023    2:30 AM 01/25/2023    2:16 AM 01/24/2023   12:20 PM  CBC  WBC 4.0 - 10.5 K/uL 8.3  8.4  12.9   Hemoglobin 13.0 - 17.0 g/dL 12.8  11.1  12.3   Hematocrit 39.0 - 52.0 % 39.5  34.1  37.1   Platelets 150 - 400 K/uL 395  380  261       Latest Ref Rng & Units 01/27/2023    2:23 AM 01/26/2023    2:30 AM 01/25/2023    2:16 AM  CMP  Glucose 70 - 99 mg/dL 142  144  150   BUN 8 - 23 mg/dL 20  15  10    Creatinine 0.61 - 1.24 mg/dL 0.37  0.31  0.37   Sodium 135 - 145 mmol/L 133  133  131   Potassium 3.5 - 5.1 mmol/L 4.0  3.9  3.9   Chloride 98 - 111 mmol/L 95  97  97   CO2 22 - 32 mmol/L 30  25  24    Calcium 8.9 - 10.3 mg/dL 9.1  9.0  8.7    DG Chest Port 1 View  Result Date: 01/18/2023 CLINICAL DATA:  Altered mental status. EXAM: PORTABLE CHEST 1 VIEW COMPARISON:  December 28, 2022 FINDINGS: There is stable tracheostomy tube and right-sided PICC line positioning. Multiple sternal wires are seen. The heart size and mediastinal contours are within normal limits. Mild atelectasis and/or infiltrate is noted within the left lung base. There is no evidence of a pleural effusion or pneumothorax. Multilevel degenerative changes seen throughout the thoracic spine.  IMPRESSION: Mild left basilar atelectasis and/or infiltrate. Electronically Signed   By: Virgina Norfolk M.D.   On: 01/18/2023 00:09     Discharge Instructions: Discharge Instructions  Call MD for:  difficulty breathing, headache or visual disturbances   Complete by: As directed    Call MD for:  persistant nausea and vomiting   Complete by: As directed    Call MD for:  severe uncontrolled pain   Complete by: As directed    Call MD for:  temperature >100.4   Complete by: As directed    Diet - low sodium heart healthy   Complete by: As directed    Discharge instructions   Complete by: As directed    Medication changes: -Midodrine 10 mg 3 times daily -Keppra 500 mg twice daily   Discharge wound care:   Complete by: As directed    Instruction above   Increase activity slowly   Complete by: As directed        Signed: Gaylan Gerold, DO 01/27/2023, 1:12 PM   Pager: [phone]

## 2023-01-27 NOTE — Plan of Care (Signed)

## 2023-01-27 NOTE — Progress Notes (Signed)
Report given to Darylene Price [facility] RN and [facility] administrator.

## 2023-01-27 NOTE — TOC Transition Note (Signed)
Transition of Care [facility]) - CM/SW Discharge Note   Patient Details  Name: Joshua Haley MRN: [id number] Date of Birth: Feb 11, 1951  Transition of [facility]) CM/SW Contact:  Joanne Chars, LCSW Phone Number: 01/27/2023, 1:47 PM   Clinical Narrative:   Pt discharging to [facility] subacute unit, room 303.  Accepting MD: Nona Dell.  RN call report to [phone]. (Main number is: [phone])  CSW confirmed with Angie/[facility] that they are ready to receive pt.    Final next level of care: Skilled Nursing Facility Barriers to Discharge: Barriers Resolved   Patient Goals and CMS Choice      Discharge Placement                Patient chooses bed at:  ([facility]) Patient to be transferred to facility by: [place] Name of family member notified: wife Joshua Haley Patient and family notified of of transfer: 01/27/23  Discharge Plan and Services Additional resources added to the After Visit Summary for                                       Social Determinants of Health (SDOH) Interventions SDOH Screenings   Depression (PHQ2-9): Low Risk  (02/22/2020)  Tobacco Use: Medium Risk (12/12/2022)     Readmission Risk Interventions     No data to display

## 2023-02-09 DIAGNOSIS — G40909 Epilepsy, unspecified, not intractable, without status epilepticus: Secondary | ICD-10-CM

## 2023-02-09 DIAGNOSIS — S069XAS Unspecified intracranial injury with loss of consciousness status unknown, sequela: Secondary | ICD-10-CM | POA: Diagnosis not present

## 2023-02-09 DIAGNOSIS — S069XAD Unspecified intracranial injury with loss of consciousness status unknown, subsequent encounter: Secondary | ICD-10-CM | POA: Diagnosis not present

## 2023-02-09 DIAGNOSIS — J9621 Acute and chronic respiratory failure with hypoxia: Secondary | ICD-10-CM | POA: Diagnosis not present

## 2023-02-09 DIAGNOSIS — R1310 Dysphagia, unspecified: Secondary | ICD-10-CM

## 2023-02-09 DIAGNOSIS — J189 Pneumonia, unspecified organism: Secondary | ICD-10-CM | POA: Diagnosis not present

## 2023-02-09 DIAGNOSIS — L89323 Pressure ulcer of left buttock, stage 3: Secondary | ICD-10-CM

## 2023-02-23 DIAGNOSIS — L89323 Pressure ulcer of left buttock, stage 3: Secondary | ICD-10-CM

## 2023-02-23 DIAGNOSIS — D649 Anemia, unspecified: Secondary | ICD-10-CM

## 2023-02-23 DIAGNOSIS — J9621 Acute and chronic respiratory failure with hypoxia: Secondary | ICD-10-CM

## 2023-02-23 DIAGNOSIS — R1310 Dysphagia, unspecified: Secondary | ICD-10-CM

## 2023-02-23 DIAGNOSIS — S069XAD Unspecified intracranial injury with loss of consciousness status unknown, subsequent encounter: Secondary | ICD-10-CM

## 2023-02-23 DIAGNOSIS — G40909 Epilepsy, unspecified, not intractable, without status epilepticus: Secondary | ICD-10-CM

## 2023-03-09 DIAGNOSIS — G40909 Epilepsy, unspecified, not intractable, without status epilepticus: Secondary | ICD-10-CM

## 2023-03-09 DIAGNOSIS — J9621 Acute and chronic respiratory failure with hypoxia: Secondary | ICD-10-CM

## 2023-03-09 DIAGNOSIS — D649 Anemia, unspecified: Secondary | ICD-10-CM

## 2023-03-09 DIAGNOSIS — S069XAD Unspecified intracranial injury with loss of consciousness status unknown, subsequent encounter: Secondary | ICD-10-CM

## 2023-03-09 DIAGNOSIS — R1310 Dysphagia, unspecified: Secondary | ICD-10-CM

## 2023-03-23 DIAGNOSIS — D649 Anemia, unspecified: Secondary | ICD-10-CM | POA: Diagnosis not present

## 2023-03-23 DIAGNOSIS — G40909 Epilepsy, unspecified, not intractable, without status epilepticus: Secondary | ICD-10-CM

## 2023-03-23 DIAGNOSIS — S069XAD Unspecified intracranial injury with loss of consciousness status unknown, subsequent encounter: Secondary | ICD-10-CM | POA: Diagnosis not present

## 2023-03-23 DIAGNOSIS — S069XAS Unspecified intracranial injury with loss of consciousness status unknown, sequela: Secondary | ICD-10-CM | POA: Diagnosis not present

## 2023-03-23 DIAGNOSIS — J9621 Acute and chronic respiratory failure with hypoxia: Secondary | ICD-10-CM | POA: Diagnosis not present

## 2023-03-23 DIAGNOSIS — R1310 Dysphagia, unspecified: Secondary | ICD-10-CM

## 2023-04-06 DIAGNOSIS — S069XAD Unspecified intracranial injury with loss of consciousness status unknown, subsequent encounter: Secondary | ICD-10-CM

## 2023-04-06 DIAGNOSIS — D649 Anemia, unspecified: Secondary | ICD-10-CM

## 2023-04-06 DIAGNOSIS — G40909 Epilepsy, unspecified, not intractable, without status epilepticus: Secondary | ICD-10-CM

## 2023-04-06 DIAGNOSIS — R1310 Dysphagia, unspecified: Secondary | ICD-10-CM

## 2023-04-06 DIAGNOSIS — J9621 Acute and chronic respiratory failure with hypoxia: Secondary | ICD-10-CM

## 2023-04-11 DIAGNOSIS — D649 Anemia, unspecified: Secondary | ICD-10-CM

## 2023-04-11 DIAGNOSIS — S069XAS Unspecified intracranial injury with loss of consciousness status unknown, sequela: Secondary | ICD-10-CM

## 2023-04-11 DIAGNOSIS — G40909 Epilepsy, unspecified, not intractable, without status epilepticus: Secondary | ICD-10-CM

## 2023-04-11 DIAGNOSIS — R1310 Dysphagia, unspecified: Secondary | ICD-10-CM

## 2023-04-11 DIAGNOSIS — J9621 Acute and chronic respiratory failure with hypoxia: Secondary | ICD-10-CM

## 2023-04-11 DIAGNOSIS — S069XAD Unspecified intracranial injury with loss of consciousness status unknown, subsequent encounter: Secondary | ICD-10-CM

## 2023-04-14 DIAGNOSIS — S069XAD Unspecified intracranial injury with loss of consciousness status unknown, subsequent encounter: Secondary | ICD-10-CM

## 2023-04-14 DIAGNOSIS — D649 Anemia, unspecified: Secondary | ICD-10-CM

## 2023-04-14 DIAGNOSIS — J9621 Acute and chronic respiratory failure with hypoxia: Secondary | ICD-10-CM

## 2023-04-14 DIAGNOSIS — S069XAS Unspecified intracranial injury with loss of consciousness status unknown, sequela: Secondary | ICD-10-CM

## 2023-04-14 DIAGNOSIS — R1310 Dysphagia, unspecified: Secondary | ICD-10-CM

## 2023-04-14 DIAGNOSIS — G40909 Epilepsy, unspecified, not intractable, without status epilepticus: Secondary | ICD-10-CM

## 2023-04-20 DIAGNOSIS — J9621 Acute and chronic respiratory failure with hypoxia: Secondary | ICD-10-CM | POA: Diagnosis not present

## 2023-04-20 DIAGNOSIS — G40909 Epilepsy, unspecified, not intractable, without status epilepticus: Secondary | ICD-10-CM | POA: Diagnosis not present

## 2023-04-20 DIAGNOSIS — S069XAD Unspecified intracranial injury with loss of consciousness status unknown, subsequent encounter: Secondary | ICD-10-CM | POA: Diagnosis not present

## 2023-04-20 DIAGNOSIS — D649 Anemia, unspecified: Secondary | ICD-10-CM | POA: Diagnosis not present

## 2023-04-20 DIAGNOSIS — R1310 Dysphagia, unspecified: Secondary | ICD-10-CM

## 2023-05-04 DIAGNOSIS — G40909 Epilepsy, unspecified, not intractable, without status epilepticus: Secondary | ICD-10-CM

## 2023-05-04 DIAGNOSIS — J9621 Acute and chronic respiratory failure with hypoxia: Secondary | ICD-10-CM

## 2023-05-04 DIAGNOSIS — S069XAD Unspecified intracranial injury with loss of consciousness status unknown, subsequent encounter: Secondary | ICD-10-CM

## 2023-05-04 DIAGNOSIS — R1310 Dysphagia, unspecified: Secondary | ICD-10-CM

## 2023-05-04 DIAGNOSIS — D649 Anemia, unspecified: Secondary | ICD-10-CM

## 2023-05-18 DIAGNOSIS — G40909 Epilepsy, unspecified, not intractable, without status epilepticus: Secondary | ICD-10-CM

## 2023-05-18 DIAGNOSIS — R1310 Dysphagia, unspecified: Secondary | ICD-10-CM

## 2023-05-18 DIAGNOSIS — J9621 Acute and chronic respiratory failure with hypoxia: Secondary | ICD-10-CM

## 2023-05-18 DIAGNOSIS — S069XAD Unspecified intracranial injury with loss of consciousness status unknown, subsequent encounter: Secondary | ICD-10-CM

## 2023-05-18 DIAGNOSIS — D649 Anemia, unspecified: Secondary | ICD-10-CM

## 2023-06-01 DIAGNOSIS — J9621 Acute and chronic respiratory failure with hypoxia: Secondary | ICD-10-CM

## 2023-06-01 DIAGNOSIS — S069XAD Unspecified intracranial injury with loss of consciousness status unknown, subsequent encounter: Secondary | ICD-10-CM

## 2023-06-01 DIAGNOSIS — R1310 Dysphagia, unspecified: Secondary | ICD-10-CM

## 2023-06-01 DIAGNOSIS — D649 Anemia, unspecified: Secondary | ICD-10-CM

## 2023-06-01 DIAGNOSIS — G40909 Epilepsy, unspecified, not intractable, without status epilepticus: Secondary | ICD-10-CM

## 2023-06-15 DIAGNOSIS — G40909 Epilepsy, unspecified, not intractable, without status epilepticus: Secondary | ICD-10-CM

## 2023-06-15 DIAGNOSIS — J9621 Acute and chronic respiratory failure with hypoxia: Secondary | ICD-10-CM

## 2023-06-15 DIAGNOSIS — D649 Anemia, unspecified: Secondary | ICD-10-CM

## 2023-06-15 DIAGNOSIS — R1310 Dysphagia, unspecified: Secondary | ICD-10-CM

## 2023-06-15 DIAGNOSIS — S069XAD Unspecified intracranial injury with loss of consciousness status unknown, subsequent encounter: Secondary | ICD-10-CM

## 2023-06-29 DIAGNOSIS — J9621 Acute and chronic respiratory failure with hypoxia: Secondary | ICD-10-CM

## 2023-06-29 DIAGNOSIS — G40909 Epilepsy, unspecified, not intractable, without status epilepticus: Secondary | ICD-10-CM

## 2023-06-29 DIAGNOSIS — R1310 Dysphagia, unspecified: Secondary | ICD-10-CM

## 2023-06-29 DIAGNOSIS — S069XAD Unspecified intracranial injury with loss of consciousness status unknown, subsequent encounter: Secondary | ICD-10-CM

## 2023-06-29 DIAGNOSIS — D649 Anemia, unspecified: Secondary | ICD-10-CM

## 2023-06-30 ENCOUNTER — Emergency Department (HOSPITAL_COMMUNITY)
Admission: EM | Admit: 2023-06-30 | Discharge: 2023-07-01 | Disposition: A | Discharge status: Home / Self Care | Payer: Non-veteran care | Attending: Emergency Medicine | Admitting: Emergency Medicine

## 2023-06-30 ENCOUNTER — Encounter (HOSPITAL_COMMUNITY): Payer: Self-pay | Admitting: Emergency Medicine

## 2023-06-30 DIAGNOSIS — R000 Tachycardia, unspecified: Secondary | ICD-10-CM | POA: Insufficient documentation

## 2023-06-30 DIAGNOSIS — I10 Essential (primary) hypertension: Secondary | ICD-10-CM | POA: Insufficient documentation

## 2023-06-30 DIAGNOSIS — Z794 Long term (current) use of insulin: Secondary | ICD-10-CM | POA: Insufficient documentation

## 2023-06-30 DIAGNOSIS — R319 Hematuria, unspecified: Secondary | ICD-10-CM | POA: Diagnosis present

## 2023-06-30 DIAGNOSIS — Z79899 Other long term (current) drug therapy: Secondary | ICD-10-CM | POA: Diagnosis not present

## 2023-06-30 DIAGNOSIS — E119 Type 2 diabetes mellitus without complications: Secondary | ICD-10-CM | POA: Insufficient documentation

## 2023-06-30 LAB — CBC WITH DIFFERENTIAL/PLATELET
Abs Immature Granulocytes: 0.06 10*3/uL (ref 0.00–0.07)
Basophils Absolute: 0.1 10*3/uL (ref 0.0–0.1)
Basophils Relative: 0 %
Eosinophils Absolute: 0 10*3/uL (ref 0.0–0.5)
Eosinophils Relative: 0 %
HCT: 39.9 % (ref 39.0–52.0)
Hemoglobin: 12.4 g/dL — ABNORMAL LOW (ref 13.0–17.0)
Immature Granulocytes: 1 %
Lymphocytes Relative: 6 %
Lymphs Abs: 0.8 10*3/uL (ref 0.7–4.0)
MCH: 25.4 pg — ABNORMAL LOW (ref 26.0–34.0)
MCHC: 31.1 g/dL (ref 30.0–36.0)
MCV: 81.6 fL (ref 80.0–100.0)
Monocytes Absolute: 0.8 10*3/uL (ref 0.1–1.0)
Monocytes Relative: 7 %
Neutro Abs: 10.7 10*3/uL — ABNORMAL HIGH (ref 1.7–7.7)
Neutrophils Relative %: 86 %
Platelets: 388 10*3/uL (ref 150–400)
RBC: 4.89 MIL/uL (ref 4.22–5.81)
RDW: 16.6 % — ABNORMAL HIGH (ref 11.5–15.5)
WBC: 12.4 10*3/uL — ABNORMAL HIGH (ref 4.0–10.5)
nRBC: 0 % (ref 0.0–0.2)

## 2023-06-30 LAB — COMPREHENSIVE METABOLIC PANEL
ALT: 20 U/L (ref 0–44)
AST: 24 U/L (ref 15–41)
Albumin: 2.6 g/dL — ABNORMAL LOW (ref 3.5–5.0)
Alkaline Phosphatase: 172 U/L — ABNORMAL HIGH (ref 38–126)
Anion gap: 15 (ref 5–15)
BUN: 19 mg/dL (ref 8–23)
CO2: 26 mmol/L (ref 22–32)
Calcium: 9.6 mg/dL (ref 8.9–10.3)
Chloride: 92 mmol/L — ABNORMAL LOW (ref 98–111)
Creatinine, Ser: 0.55 mg/dL — ABNORMAL LOW (ref 0.61–1.24)
GFR, Estimated: >60 mL/min (ref >60)
Glucose, Bld: 377 mg/dL — ABNORMAL HIGH (ref 70–99)
Potassium: 4.4 mmol/L (ref 3.5–5.1)
Sodium: 133 mmol/L — ABNORMAL LOW (ref 135–145)
Total Bilirubin: 0.7 mg/dL (ref 0.3–1.2)
Total Protein: 7.5 g/dL (ref 6.5–8.1)

## 2023-06-30 LAB — URINALYSIS, ROUTINE W REFLEX MICROSCOPIC
RBC / HPF: >50 RBC/hpf (ref 0–5)
WBC, UA: >50 WBC/hpf (ref 0–5)

## 2023-06-30 LAB — LIPASE, BLOOD: Lipase: 25 U/L (ref 11–51)

## 2023-06-30 NOTE — ED Provider Notes (Signed)
Joshua Haley EMERGENCY DEPARTMENT AT [facility] Provider Note   CSN: [id number] Arrival date & time: 06/30/23  2210     History  No chief complaint on file.  Level 5 Caveat: Patient nonverbal  Joshua Haley is a 72 y.o. male with a past medical history of TBI, hypertension, diabetes, who presents emergency department brought in by EMS with concerns for hematuria onset prior to arrival.  Per facility at [facility], patient pulled out his Foley catheter.  Staff at [facility] replaced his catheter and noticed gross hematuria.  They note the patient is at his baseline.    The history is provided by the patient, the EMS personnel and a caregiver. No language interpreter was used.       Home Medications Prior to Admission medications   Medication Sig Start Date End Date Taking? Authorizing Provider  amantadine (SYMMETREL) 50 MG/5ML solution Place 100 mg into feeding tube daily.    [provider]  Amino Acids-Protein Hydrolys (PRO-STAT AWC) LIQD Place 30 mLs into feeding tube daily.    [provider]  ascorbic acid (VITAMIN C) 500 MG tablet Place 500 mg into feeding tube daily. Give 500mg  per tube daily    [provider]  enoxaparin (LOVENOX) 40 MG/0.4ML injection Inject 40 mg into the skin daily.    [provider]  ferrous sulfate 300 (60 Fe) MG/5ML syrup Place 300 mg into feeding tube 2 (two) times daily.    [provider]  furosemide (LASIX) 20 MG tablet Place 20 mg into feeding tube 2 (two) times daily.    [provider]  insulin aspart (NOVOLOG) 100 UNIT/ML injection insulin aspart (novoLOG) injection 0-15 Units 0-15 Units, Subcutaneous, Every 4 hours,  CBG < 70: Implement Hypoglycemia Standing Orders and refer to Hypoglycemia Standing Orders sidebar report CBG 70 - 120: 0 units CBG 121 - 150: 2 units CBG 151 - 200: 3 units CBG 201 - 250: 5 units CBG 251 - 300: 8 units CBG 301 - 350: 11 units CBG 351 - 400:  15 units CBG > 400: call MD Patient taking differently: Inject 0-21 Units into the skin in the morning, at noon, in the evening, and at bedtime. Sliding scale insulin: Blood sugar is <60 or >500, notify MD. Blood sugar is 0-150= 0 units Blood sugar is 151-200= 3 units Blood sugar is 201-250= 6 units Blood sugar is 251-300= 9 units Blood sugar is 301-350= 12 units Blood sugar is 351-400= 15 units Blood sugar is 401-450= 18 units Blood sugar is 451-500= 21 units 09/25/22   Joshua Haley, Joshua Lean, MD  insulin glargine (LANTUS) 100 UNIT/ML injection Inject 0.25 mLs (25 Units total) into the skin daily. Patient taking differently: Inject 30 Units into the skin daily. 09/25/22   Joshua Haley, Joshua Lean, MD  lansoprazole (PREVACID) 30 MG capsule Give 1 capsule (30mg ) per G tube daily    [provider]  leptospermum manuka honey (MEDIHONEY) PSTE paste Apply 1 Application topically daily. 12/31/22   Joshua Simmonds, MD  levETIRAcetam (KEPPRA) 500 MG tablet Place 1 tablet (500 mg total) into feeding tube 2 (two) times daily. 01/27/23   Joshua Stabler, DO  levothyroxine (SYNTHROID) 100 MCG tablet Place 100 mcg into feeding tube daily. Give per tube daily    [provider]  liothyronine (CYTOMEL) 5 MCG tablet Place 7.5 mcg into feeding tube every 12 (twelve) hours. Give one and one half tablet (7.5mg ) per tube every 12 hours  [provider]  Methylcellulose, Laxative, (CITRUCEL) 500 MG TABS Place 500 mg into feeding tube 3 (three) times daily.    [provider]  metoprolol tartrate (LOPRESSOR) 25 MG tablet Place 12.5 mg into feeding tube 2 (two) times daily.    [provider]  midodrine (PROAMATINE) 10 MG tablet 1 tablet (10 mg total) by Per NG tube route 3 (three) times daily with meals. 01/27/23   Joshua Stabler, DO  modafinil (PROVIGIL) 100 MG tablet Place 100 mg into feeding tube daily.    [provider]  Multiple Vitamin (MULTIVITAMIN) LIQD Place 15 mLs  into feeding tube daily. Patient taking differently: Place into feeding tube daily. Give 1 tablet 12/31/22   Joshua Simmonds, MD  Nutritional Supplements [employer]) PACK Place 1 packet into feeding tube 2 (two) times daily. Arginaid 4.5gram-156mg /9.2 gram oral powder packet    [provider]  Nutritional Supplements (FEEDING SUPPLEMENT, GLUCERNA 1.5 CAL,) LIQD Place 60 mL/hr into feeding tube See admin instructions. Give 55mL/hr per tube by shift    [provider]  omega-3 acid ethyl esters (LOVAZA) 1 g capsule Place 1 g into feeding tube daily. Give 1 g per tube daily    [provider]  ondansetron (ZOFRAN-ODT) 4 MG disintegrating tablet 4 mg every 6 (six) hours as needed for nausea or vomiting.    [provider]  polyethylene glycol (MIRALAX / GLYCOLAX) 17 g packet Give 17g per tube daily as needed for constipation    [provider]  sertraline (ZOLOFT) 25 MG tablet Place 25 mg into feeding tube daily. Give 25mg  per tube daily    [provider]  sodium chloride HYPERTONIC 3 % nebulizer solution Take 4 mLs by nebulization 2 (two) times daily. Patient not taking: Reported on 01/18/2023 12/30/22   Joshua Simmonds, MD  sodium chloride irrigation 0.9 % irrigation Irrigate with 60 mLs as directed every 8 (eight) hours. Irrigate indwelling foley with 60mL sterile saline every 8 hours.    [provider]  Water For Irrigation, Sterile (FREE WATER) SOLN Place 300 mLs into feeding tube every 4 (four) hours. Patient not taking: Reported on 01/18/2023 12/30/22   Joshua Simmonds, MD      Allergies    Morphine and codeine, Penicillins, and Statins    Review of Systems   Review of Systems  Unable to perform ROS: Patient nonverbal    Physical Exam Updated Vital Signs BP 132/87 (BP Location: Right Arm)   Pulse (!) 105   Temp 98.8 F (37.1 C) (Axillary)   Resp 15   SpO2 100%  Physical Exam Vitals and nursing note reviewed.  Constitutional:       General: He is not in acute distress.    Appearance: Normal appearance.  Eyes:     General: No scleral icterus.    Extraocular Movements: Extraocular movements intact.  Cardiovascular:     Rate and Rhythm: Tachycardia present.     Pulses: Normal pulses.     Heart sounds: Normal heart sounds.  Pulmonary:     Effort: Pulmonary effort is normal. No respiratory distress.     Breath sounds: Normal breath sounds.  Abdominal:     General: Abdomen is flat. Bowel sounds are normal.     Palpations: Abdomen is soft. There is no mass.     Tenderness: There is no abdominal tenderness.     Comments: PEG tube in place  Genitourinary:    Comments: RN chaperone present for GU exam.  No  gross blood noted to the urethral meatus.  Mixture of purulent and sanguinous fluid noted to urethral meatus.  No tenderness to palpation noted. Musculoskeletal:        General: Normal range of motion.     Cervical back: Neck supple.  Skin:    General: Skin is warm and dry.     Findings: No rash.  Neurological:     Mental Status: He is alert.     Sensory: Sensation is intact.     Motor: Motor function is intact.  Psychiatric:        Behavior: Behavior normal.     ED Results / Procedures / Treatments   Labs (all labs ordered are listed, but only abnormal results are displayed) Labs Reviewed - No data to display  EKG None  Radiology No results found.  Procedures Procedures    Medications Ordered in ED Medications - No data to display  ED Course/ Medical Decision Making/ A&P Clinical Course as of 06/30/23 2352  Wed Jun 30, 2023  2230 Discussion with patient's nurse at [facility] who reports that during dayshift patient got out of his bed and pulled his Foley catheter.  He initially had a 49 [place] coud catheter in place.  It was replaced with a 16 French catheter.  Nurse reports that when her shift started she noticed blood in the catheter but no blood in the drainage bag.  She irrigated  at 8:30 PM with 120 cc saline with frank blood return.  Nurse notes approximately 30-40 minutes later there was not 900 cc of frank blood.  EMS was called and at that time patient began to have projectile vomiting consistent of chewed food.  Nurse denies the patient having blood, coffee-ground emesis.  Nurse notes that patient is at baseline. [SB]  2233 Pulled out 36french catheter [CC]    Clinical Course User Index [CC] Glyn Ade, MD [SB] Delphine Sizemore A, PA-C                             Medical Decision Making Amount and/or Complexity of Data Reviewed Labs: ordered.   Pt presents with concerns for hematuria onset PTA. Vital signs, pt afebrile, slightly tachycardic at 105. On exam, pt with RN chaperone present for GU exam.  No gross blood noted to the urethral meatus.  Mixture of purulent and sanguinous fluid noted to urethral meatus.  No tenderness to palpation noted. No acute cardiovascular, respiratory, abdominal exam findings. Differential diagnosis includes ***.    Co morbidities that complicate the patient evaluation: ***  Additional history obtained:  Additional history obtained from {sabhistory:27144} External records from outside source obtained and reviewed including: ***  Labs:  I ordered, and personally interpreted labs.  The pertinent results include:   Lipase unremarkable CBC with hgb at 12.4, WBC slightly elevated at 12.4 otherwise unremarkable CMP with slightly decreased sodium at 133, glucose at 377, alk phos at 172 otherwise unremarkable Urinalysis red in appearance with bacteria otherwise unable to complete urinalysis due to appearance  Imaging: I ordered imaging studies including {sabimaging:28099} I independently visualized and interpreted imaging which showed: *** I agree with the radiologist interpretation  Medications:  I ordered medication including {sabmeds:28100} for *** Reevaluation of the patient after these medicines and interventions, I  reevaluated the patient and found that they have {resolved/improved/worsened:23923::"improved"} I have reviewed the patients home medicines and have made adjustments as needed   {Cardiac Monitoring: The patient was maintained  on a cardiac monitor.  I personally viewed and interpreted the cardiac monitored which showed an underlying rhythm of: ***.   Test Considered: ***   Critical Interventions ***}   {Consultations: I requested consultation with the {sabspecialists:27145}, and discussed lab and imaging findings as well as pertinent plan - they recommend: ***}  Social Determinants of Health: ***   Disposition: {End of MDM here with the likely diagnosis}. After consideration of the diagnostic results and the patients response to treatment, I feel that the patient would benefit from {sabdispo:27146}. Supportive care measures and strict return precautions discussed with patient at bedside. Pt acknowledges and verbalizes understanding. Pt appears safe for discharge. Follow up as indicated in discharge paperwork.    This chart was dictated using voice recognition software, Dragon. Despite the best efforts of this provider to proofread and correct errors, errors may still occur which can change documentation meaning.   Final Clinical Impression(s) / ED Diagnoses Final diagnoses:  None    Rx / DC Orders ED Discharge Orders     None

## 2023-06-30 NOTE — ED Triage Notes (Signed)
Pt coming from [facility]. Foley ripped out earlier today, foley replaced, 1.2L of dark red hematuria.

## 2023-07-01 MED ORDER — CEFTRIAXONE SODIUM 1 G IJ SOLR
1.0000 g | Freq: Once | INTRAMUSCULAR | Status: AC
  Administered 2023-07-01: 1 g via INTRAMUSCULAR
  Filled 2023-07-01: qty 10

## 2023-07-01 MED ORDER — CEPHALEXIN 250 MG/5ML PO SUSR: 250.0000 mg | Freq: Four times a day (QID) | ORAL | 0 refills | Status: AC

## 2023-07-01 NOTE — ED Notes (Signed)
[facility] Called, no eta  

## 2023-07-01 NOTE — Discharge Instructions (Addendum)
Your hemoglobin looked well today. You will be sent a prescription for Keflex, take as directed to treat for a likely UTI. Ensure to continue with your medications as prescribed. Return to the ED if you are experiencing increasing/worsening symptoms.

## 2023-07-13 DIAGNOSIS — G40909 Epilepsy, unspecified, not intractable, without status epilepticus: Secondary | ICD-10-CM

## 2023-07-13 DIAGNOSIS — S069XAD Unspecified intracranial injury with loss of consciousness status unknown, subsequent encounter: Secondary | ICD-10-CM

## 2023-07-13 DIAGNOSIS — R1310 Dysphagia, unspecified: Secondary | ICD-10-CM

## 2023-07-13 DIAGNOSIS — D649 Anemia, unspecified: Secondary | ICD-10-CM

## 2023-07-13 DIAGNOSIS — J9621 Acute and chronic respiratory failure with hypoxia: Secondary | ICD-10-CM

## 2023-07-27 DIAGNOSIS — G40909 Epilepsy, unspecified, not intractable, without status epilepticus: Secondary | ICD-10-CM

## 2023-07-27 DIAGNOSIS — R1310 Dysphagia, unspecified: Secondary | ICD-10-CM

## 2023-07-27 DIAGNOSIS — J9621 Acute and chronic respiratory failure with hypoxia: Secondary | ICD-10-CM

## 2023-07-27 DIAGNOSIS — D649 Anemia, unspecified: Secondary | ICD-10-CM

## 2023-08-10 DIAGNOSIS — R1310 Dysphagia, unspecified: Secondary | ICD-10-CM

## 2023-08-10 DIAGNOSIS — D649 Anemia, unspecified: Secondary | ICD-10-CM

## 2023-08-10 DIAGNOSIS — G40909 Epilepsy, unspecified, not intractable, without status epilepticus: Secondary | ICD-10-CM

## 2023-08-10 DIAGNOSIS — S069XAS Unspecified intracranial injury with loss of consciousness status unknown, sequela: Secondary | ICD-10-CM

## 2023-08-10 DIAGNOSIS — J9621 Acute and chronic respiratory failure with hypoxia: Secondary | ICD-10-CM

## 2023-08-24 DIAGNOSIS — R1310 Dysphagia, unspecified: Secondary | ICD-10-CM

## 2023-08-24 DIAGNOSIS — D649 Anemia, unspecified: Secondary | ICD-10-CM

## 2023-08-24 DIAGNOSIS — G40909 Epilepsy, unspecified, not intractable, without status epilepticus: Secondary | ICD-10-CM

## 2023-08-24 DIAGNOSIS — S069XAS Unspecified intracranial injury with loss of consciousness status unknown, sequela: Secondary | ICD-10-CM

## 2023-08-24 DIAGNOSIS — J9621 Acute and chronic respiratory failure with hypoxia: Secondary | ICD-10-CM

## 2023-09-07 DIAGNOSIS — D649 Anemia, unspecified: Secondary | ICD-10-CM

## 2023-09-07 DIAGNOSIS — S069XAS Unspecified intracranial injury with loss of consciousness status unknown, sequela: Secondary | ICD-10-CM

## 2023-09-07 DIAGNOSIS — R1310 Dysphagia, unspecified: Secondary | ICD-10-CM

## 2023-09-07 DIAGNOSIS — G40909 Epilepsy, unspecified, not intractable, without status epilepticus: Secondary | ICD-10-CM

## 2023-09-07 DIAGNOSIS — J9621 Acute and chronic respiratory failure with hypoxia: Secondary | ICD-10-CM

## 2023-09-21 DIAGNOSIS — R1310 Dysphagia, unspecified: Secondary | ICD-10-CM

## 2023-09-21 DIAGNOSIS — S069XAD Unspecified intracranial injury with loss of consciousness status unknown, subsequent encounter: Secondary | ICD-10-CM

## 2023-09-21 DIAGNOSIS — G40909 Epilepsy, unspecified, not intractable, without status epilepticus: Secondary | ICD-10-CM

## 2023-09-21 DIAGNOSIS — J9621 Acute and chronic respiratory failure with hypoxia: Secondary | ICD-10-CM

## 2023-09-21 DIAGNOSIS — D649 Anemia, unspecified: Secondary | ICD-10-CM

## 2023-10-05 DIAGNOSIS — R1310 Dysphagia, unspecified: Secondary | ICD-10-CM

## 2023-10-05 DIAGNOSIS — G40909 Epilepsy, unspecified, not intractable, without status epilepticus: Secondary | ICD-10-CM

## 2023-10-05 DIAGNOSIS — D649 Anemia, unspecified: Secondary | ICD-10-CM

## 2023-10-05 DIAGNOSIS — J9621 Acute and chronic respiratory failure with hypoxia: Secondary | ICD-10-CM

## 2023-10-19 DIAGNOSIS — R1310 Dysphagia, unspecified: Secondary | ICD-10-CM

## 2023-10-19 DIAGNOSIS — J9621 Acute and chronic respiratory failure with hypoxia: Secondary | ICD-10-CM

## 2023-10-19 DIAGNOSIS — D649 Anemia, unspecified: Secondary | ICD-10-CM

## 2023-10-19 DIAGNOSIS — G40909 Epilepsy, unspecified, not intractable, without status epilepticus: Secondary | ICD-10-CM

## 2023-10-30 ENCOUNTER — Other Ambulatory Visit: Payer: Self-pay

## 2023-10-30 ENCOUNTER — Emergency Department (HOSPITAL_COMMUNITY)
Admission: EM | Admit: 2023-10-30 | Discharge: 2023-10-31 | Disposition: A | Discharge status: Home / Self Care | Payer: Non-veteran care | Attending: Emergency Medicine | Admitting: Emergency Medicine

## 2023-10-30 DIAGNOSIS — N3091 Cystitis, unspecified with hematuria: Secondary | ICD-10-CM | POA: Diagnosis not present

## 2023-10-30 DIAGNOSIS — Z79899 Other long term (current) drug therapy: Secondary | ICD-10-CM | POA: Diagnosis not present

## 2023-10-30 DIAGNOSIS — Z794 Long term (current) use of insulin: Secondary | ICD-10-CM | POA: Diagnosis not present

## 2023-10-30 DIAGNOSIS — I10 Essential (primary) hypertension: Secondary | ICD-10-CM | POA: Diagnosis not present

## 2023-10-30 DIAGNOSIS — R319 Hematuria, unspecified: Secondary | ICD-10-CM | POA: Diagnosis present

## 2023-10-30 DIAGNOSIS — I2510 Atherosclerotic heart disease of native coronary artery without angina pectoris: Secondary | ICD-10-CM | POA: Diagnosis not present

## 2023-10-30 LAB — PROTIME-INR
INR: 1.1 (ref 0.8–1.2)
Prothrombin Time: 14.2 s (ref 11.4–15.2)

## 2023-10-30 LAB — CBC WITH DIFFERENTIAL/PLATELET
Abs Immature Granulocytes: 0.08 10*3/uL — ABNORMAL HIGH (ref 0.00–0.07)
Basophils Absolute: 0.1 10*3/uL (ref 0.0–0.1)
Basophils Relative: 1 %
Eosinophils Absolute: 0.1 10*3/uL (ref 0.0–0.5)
Eosinophils Relative: 1 %
HCT: 49.1 % (ref 39.0–52.0)
Hemoglobin: 15.2 g/dL (ref 13.0–17.0)
Immature Granulocytes: 1 %
Lymphocytes Relative: 6 %
Lymphs Abs: 1.1 10*3/uL (ref 0.7–4.0)
MCH: 26 pg (ref 26.0–34.0)
MCHC: 31 g/dL (ref 30.0–36.0)
MCV: 83.9 fL (ref 80.0–100.0)
Monocytes Absolute: 1.2 10*3/uL — ABNORMAL HIGH (ref 0.1–1.0)
Monocytes Relative: 7 %
Neutro Abs: 14.7 10*3/uL — ABNORMAL HIGH (ref 1.7–7.7)
Neutrophils Relative %: 84 %
Platelets: 333 10*3/uL (ref 150–400)
RBC: 5.85 MIL/uL — ABNORMAL HIGH (ref 4.22–5.81)
RDW: 18.3 % — ABNORMAL HIGH (ref 11.5–15.5)
WBC: 17.3 10*3/uL — ABNORMAL HIGH (ref 4.0–10.5)
nRBC: 0 % (ref 0.0–0.2)

## 2023-10-30 NOTE — ED Notes (Signed)
3 Rns attempted to start IV without success, IV team consulted

## 2023-10-30 NOTE — ED Provider Notes (Signed)
[place] EMERGENCY DEPARTMENT AT [facility] Provider Note   CSN: [id number] Arrival date & time: 10/30/23  2145     History {Add pertinent medical, surgical, social history, OB history to HPI:1} Chief Complaint  Patient presents with   Hematuria    Joshua Haley is a 72 y.o. male.  72 year old male with a history of TBI, hypertension, CAD, hypothyroid, tracheostomy, and chronic Foley presents to the emergency department from [facility] for evaluation of hematuria.  Staff at the facility noticed blood in the Foley bag.  EMS reports that the patient may have tried to pull out his Foley; trauma may be responsible for onset of his bleeding.  Patient is nonverbal at baseline with tracheostomy in place.  Arrives to the ED with DNR form.  The history is provided by the patient. No language interpreter was used.  Hematuria       Home Medications Prior to Admission medications   Medication Sig Start Date End Date Taking? Authorizing Provider  amantadine (SYMMETREL) 50 MG/5ML solution Place 100 mg into feeding tube daily.    [provider]  Amino Acids-Protein Hydrolys (PRO-STAT AWC) LIQD Place 30 mLs into feeding tube daily.    [provider]  ascorbic acid (VITAMIN C) 500 MG tablet Place 500 mg into feeding tube daily. Give 500mg  per tube daily    [provider]  enoxaparin (LOVENOX) 40 MG/0.4ML injection Inject 40 mg into the skin daily.    [provider]  ferrous sulfate 300 (60 Fe) MG/5ML syrup Place 300 mg into feeding tube 2 (two) times daily.    [provider]  furosemide (LASIX) 20 MG tablet Place 20 mg into feeding tube 2 (two) times daily.    [provider]  insulin aspart (NOVOLOG) 100 UNIT/ML injection insulin aspart (novoLOG) injection 0-15 Units 0-15 Units, Subcutaneous, Every 4 hours,  CBG < 70: Implement Hypoglycemia Standing Orders and refer to Hypoglycemia Standing Orders sidebar report CBG 70  - 120: 0 units CBG 121 - 150: 2 units CBG 151 - 200: 3 units CBG 201 - 250: 5 units CBG 251 - 300: 8 units CBG 301 - 350: 11 units CBG 351 - 400: 15 units CBG > 400: call MD Patient taking differently: Inject 0-21 Units into the skin in the morning, at noon, in the evening, and at bedtime. Sliding scale insulin: Blood sugar is <60 or >500, notify MD. Blood sugar is 0-150= 0 units Blood sugar is 151-200= 3 units Blood sugar is 201-250= 6 units Blood sugar is 251-300= 9 units Blood sugar is 301-350= 12 units Blood sugar is 351-400= 15 units Blood sugar is 401-450= 18 units Blood sugar is 451-500= 21 units 09/25/22   Ghimire, Werner Lean, MD  insulin glargine (LANTUS) 100 UNIT/ML injection Inject 0.25 mLs (25 Units total) into the skin daily. Patient taking differently: Inject 30 Units into the skin daily. 09/25/22   Ghimire, Werner Lean, MD  lansoprazole (PREVACID) 30 MG capsule Give 1 capsule (30mg ) per G tube daily    [provider]  leptospermum manuka honey (MEDIHONEY) PSTE paste Apply 1 Application topically daily. 12/31/22   Quincy Simmonds, MD  levETIRAcetam (KEPPRA) 500 MG tablet Place 1 tablet (500 mg total) into feeding tube 2 (two) times daily. 01/27/23   Doran Stabler, DO  levothyroxine (SYNTHROID) 100 MCG tablet Place 100 mcg into feeding tube daily. Give per tube daily    [provider]  liothyronine (CYTOMEL) 5 MCG tablet Place  7.5 mcg into feeding tube every 12 (twelve) hours. Give one and one half tablet (7.5mg ) per tube every 12 hours    [provider]  Methylcellulose, Laxative, (CITRUCEL) 500 MG TABS Place 500 mg into feeding tube 3 (three) times daily.    [provider]  metoprolol tartrate (LOPRESSOR) 25 MG tablet Place 12.5 mg into feeding tube 2 (two) times daily.    [provider]  midodrine (PROAMATINE) 10 MG tablet 1 tablet (10 mg total) by Per NG tube route 3 (three) times daily with meals. 01/27/23   Doran Stabler, DO   modafinil (PROVIGIL) 100 MG tablet Place 100 mg into feeding tube daily.    [provider]  Multiple Vitamin (MULTIVITAMIN) LIQD Place 15 mLs into feeding tube daily. Patient taking differently: Place into feeding tube daily. Give 1 tablet 12/31/22   Quincy Simmonds, MD  Nutritional Supplements [employer]) PACK Place 1 packet into feeding tube 2 (two) times daily. Arginaid 4.5gram-156mg /9.2 gram oral powder packet    [provider]  Nutritional Supplements (FEEDING SUPPLEMENT, GLUCERNA 1.5 CAL,) LIQD Place 60 mL/hr into feeding tube See admin instructions. Give 34mL/hr per tube by shift    [provider]  omega-3 acid ethyl esters (LOVAZA) 1 g capsule Place 1 g into feeding tube daily. Give 1 g per tube daily    [provider]  ondansetron (ZOFRAN-ODT) 4 MG disintegrating tablet 4 mg every 6 (six) hours as needed for nausea or vomiting.    [provider]  polyethylene glycol (MIRALAX / GLYCOLAX) 17 g packet Give 17g per tube daily as needed for constipation    [provider]  sertraline (ZOLOFT) 25 MG tablet Place 25 mg into feeding tube daily. Give 25mg  per tube daily    [provider]  sodium chloride HYPERTONIC 3 % nebulizer solution Take 4 mLs by nebulization 2 (two) times daily. Patient not taking: Reported on 01/18/2023 12/30/22   Quincy Simmonds, MD  sodium chloride irrigation 0.9 % irrigation Irrigate with 60 mLs as directed every 8 (eight) hours. Irrigate indwelling foley with 60mL sterile saline every 8 hours.    [provider]  Water For Irrigation, Sterile (FREE WATER) SOLN Place 300 mLs into feeding tube every 4 (four) hours. Patient not taking: Reported on 01/18/2023 12/30/22   Quincy Simmonds, MD      Allergies    Morphine and codeine, Penicillins, and Statins    Review of Systems   Review of Systems  Unable to perform ROS: Patient nonverbal  Genitourinary:  Positive for hematuria.    Physical Exam Updated  Vital Signs BP (!) 159/94   Pulse 87   Temp 100 F (37.8 C) (Axillary)   Resp 20   SpO2 98%   Physical Exam Vitals and nursing note reviewed.  Constitutional:      General: He is not in acute distress.    Appearance: He is well-developed. He is not diaphoretic.     Comments: Nontoxic appearing  HENT:     Head: Normocephalic and atraumatic.  Eyes:     General: No scleral icterus.    Conjunctiva/sclera: Conjunctivae normal.  Neck:     Comments: Trach present Pulmonary:     Effort: Pulmonary effort is normal. No respiratory distress.     Comments: Respirations even and unlabored Genitourinary:    Comments: Uncircumcised penis with foley catheter. Frank hematuria in bag without clots. No signs of trauma to the urethral meatus. Musculoskeletal:  General: Normal range of motion.     Cervical back: Normal range of motion.  Skin:    General: Skin is warm and dry.     Coloration: Skin is not pale.     Findings: No erythema or rash.  Neurological:     Mental Status: He is alert.     Comments: Alert, moving extremities spontaneously.  Psychiatric:        Behavior: Behavior normal.     ED Results / Procedures / Treatments   Labs (all labs ordered are listed, but only abnormal results are displayed) Labs Reviewed  CBC WITH DIFFERENTIAL/PLATELET  BASIC METABOLIC PANEL  PROTIME-INR  URINALYSIS, ROUTINE W REFLEX MICROSCOPIC    EKG None  Radiology No results found.  Procedures Procedures  {Document cardiac monitor, telemetry assessment procedure when appropriate:1}  Medications Ordered in ED Medications - No data to display  ED Course/ Medical Decision Making/ A&P Clinical Course as of 10/30/23 2229  Sat Oct 30, 2023  2223 Spoke with RN at [facility] regarding patient transport. RN reports transport due to hematuria since this AM. Has had flushing and lavage during the day by nursing staff without any change. Staff suspect trauma from patient pulling the catheter,  though no witnessed trauma yesterday or this morning. No fevers known prior to arrival. Staff report documented temp of 98.72F prior to transport to the ED. Staff confirm use of daily Lovenox.  [KH]    Clinical Course User Index [KH] Antony Madura, PA-C   {   Click here for ABCD2, HEART and other calculatorsREFRESH Note before signing :1}                              Medical Decision Making Amount and/or Complexity of Data Reviewed Labs: ordered.   ***  {Document critical care time when appropriate:1} {Document review of labs and clinical decision tools ie heart score, Chads2Vasc2 etc:1}  {Document your independent review of radiology images, and any outside records:1} {Document your discussion with family members, caretakers, and with consultants:1} {Document social determinants of health affecting pt's care:1} {Document your decision making why or why not admission, treatments were needed:1} Final Clinical Impression(s) / ED Diagnoses Final diagnoses:  None    Rx / DC Orders ED Discharge Orders     None

## 2023-10-30 NOTE — ED Triage Notes (Signed)
Pt from [facility] with blood in foley bag pt may have tried to pull it out which may be the reason for the blood. Pt has a trach. Nonverbal. A and o only to self feels hot.

## 2023-10-30 NOTE — ED Notes (Signed)
Bladder scanned pt he has in bladder

## 2023-10-31 LAB — BASIC METABOLIC PANEL WITH GFR
Anion gap: 13 (ref 5–15)
BUN: 25 mg/dL — ABNORMAL HIGH (ref 8–23)
CO2: 25 mmol/L (ref 22–32)
Calcium: 9.7 mg/dL (ref 8.9–10.3)
Chloride: 98 mmol/L (ref 98–111)
Creatinine, Ser: 0.66 mg/dL (ref 0.61–1.24)
GFR, Estimated: >60 mL/min
Glucose, Bld: 227 mg/dL — ABNORMAL HIGH (ref 70–99)
Potassium: 5 mmol/L (ref 3.5–5.1)
Sodium: 136 mmol/L (ref 135–145)

## 2023-10-31 LAB — URINALYSIS, ROUTINE W REFLEX MICROSCOPIC
Bilirubin Urine: NEGATIVE
Glucose, UA: NEGATIVE mg/dL
Ketones, ur: NEGATIVE mg/dL
Nitrite: NEGATIVE
Protein, ur: 100 mg/dL — AB
RBC / HPF: >50 RBC/hpf (ref 0–5)
Specific Gravity, Urine: 1.011 (ref 1.005–1.030)
WBC, UA: >50 WBC/hpf (ref 0–5)
pH: 8 (ref 5.0–8.0)

## 2023-10-31 MED ORDER — CEFUROXIME AXETIL 500 MG PO TABS: 500.0000 mg | ORAL_TABLET | Freq: Two times a day (BID) | ORAL | 0 refills | Status: DC

## 2023-10-31 MED ORDER — SODIUM CHLORIDE 0.9 % IV SOLN
1.0000 g | Freq: Once | INTRAVENOUS | Status: AC
  Administered 2023-10-31: 1 g via INTRAVENOUS
  Filled 2023-10-31: qty 10

## 2023-10-31 NOTE — Discharge Instructions (Addendum)
Take Ceftin as prescribed until finished. Have symptoms rechecked by your primary care doctor. Return for new or concerning symptoms.

## 2023-10-31 NOTE — ED Notes (Signed)
[facility] called for transport.  

## 2023-11-01 LAB — URINE CULTURE: Culture: >=100000 — AB

## 2023-11-02 DIAGNOSIS — R1310 Dysphagia, unspecified: Secondary | ICD-10-CM

## 2023-11-02 DIAGNOSIS — J9621 Acute and chronic respiratory failure with hypoxia: Secondary | ICD-10-CM

## 2023-11-02 DIAGNOSIS — D649 Anemia, unspecified: Secondary | ICD-10-CM

## 2023-11-02 DIAGNOSIS — G40909 Epilepsy, unspecified, not intractable, without status epilepticus: Secondary | ICD-10-CM

## 2023-11-16 DIAGNOSIS — D649 Anemia, unspecified: Secondary | ICD-10-CM

## 2023-11-16 DIAGNOSIS — R1310 Dysphagia, unspecified: Secondary | ICD-10-CM

## 2023-11-16 DIAGNOSIS — J9621 Acute and chronic respiratory failure with hypoxia: Secondary | ICD-10-CM

## 2023-11-16 DIAGNOSIS — G40909 Epilepsy, unspecified, not intractable, without status epilepticus: Secondary | ICD-10-CM

## 2023-11-16 DIAGNOSIS — S069XAS Unspecified intracranial injury with loss of consciousness status unknown, sequela: Secondary | ICD-10-CM

## 2023-11-30 DIAGNOSIS — S069XAS Unspecified intracranial injury with loss of consciousness status unknown, sequela: Secondary | ICD-10-CM

## 2023-11-30 DIAGNOSIS — D649 Anemia, unspecified: Secondary | ICD-10-CM

## 2023-11-30 DIAGNOSIS — G40909 Epilepsy, unspecified, not intractable, without status epilepticus: Secondary | ICD-10-CM

## 2023-11-30 DIAGNOSIS — J9621 Acute and chronic respiratory failure with hypoxia: Secondary | ICD-10-CM

## 2023-11-30 DIAGNOSIS — R1310 Dysphagia, unspecified: Secondary | ICD-10-CM

## 2023-12-14 DIAGNOSIS — J9621 Acute and chronic respiratory failure with hypoxia: Secondary | ICD-10-CM

## 2023-12-14 DIAGNOSIS — R1310 Dysphagia, unspecified: Secondary | ICD-10-CM

## 2023-12-14 DIAGNOSIS — G40909 Epilepsy, unspecified, not intractable, without status epilepticus: Secondary | ICD-10-CM

## 2023-12-14 DIAGNOSIS — S069XAS Unspecified intracranial injury with loss of consciousness status unknown, sequela: Secondary | ICD-10-CM

## 2023-12-14 DIAGNOSIS — D649 Anemia, unspecified: Secondary | ICD-10-CM

## 2023-12-28 DIAGNOSIS — J9621 Acute and chronic respiratory failure with hypoxia: Secondary | ICD-10-CM

## 2023-12-28 DIAGNOSIS — G40909 Epilepsy, unspecified, not intractable, without status epilepticus: Secondary | ICD-10-CM

## 2023-12-28 DIAGNOSIS — R1310 Dysphagia, unspecified: Secondary | ICD-10-CM

## 2023-12-28 DIAGNOSIS — S069XAD Unspecified intracranial injury with loss of consciousness status unknown, subsequent encounter: Secondary | ICD-10-CM

## 2023-12-28 DIAGNOSIS — D649 Anemia, unspecified: Secondary | ICD-10-CM

## 2024-01-11 DIAGNOSIS — R1310 Dysphagia, unspecified: Secondary | ICD-10-CM

## 2024-01-11 DIAGNOSIS — J189 Pneumonia, unspecified organism: Secondary | ICD-10-CM

## 2024-01-11 DIAGNOSIS — D649 Anemia, unspecified: Secondary | ICD-10-CM

## 2024-01-11 DIAGNOSIS — J9621 Acute and chronic respiratory failure with hypoxia: Secondary | ICD-10-CM

## 2024-01-11 DIAGNOSIS — G40909 Epilepsy, unspecified, not intractable, without status epilepticus: Secondary | ICD-10-CM

## 2024-01-25 DIAGNOSIS — J189 Pneumonia, unspecified organism: Secondary | ICD-10-CM

## 2024-01-25 DIAGNOSIS — G40909 Epilepsy, unspecified, not intractable, without status epilepticus: Secondary | ICD-10-CM

## 2024-01-25 DIAGNOSIS — D649 Anemia, unspecified: Secondary | ICD-10-CM

## 2024-01-25 DIAGNOSIS — R1310 Dysphagia, unspecified: Secondary | ICD-10-CM

## 2024-01-25 DIAGNOSIS — J9621 Acute and chronic respiratory failure with hypoxia: Secondary | ICD-10-CM

## 2024-01-29 DIAGNOSIS — J9621 Acute and chronic respiratory failure with hypoxia: Secondary | ICD-10-CM

## 2024-01-29 DIAGNOSIS — D649 Anemia, unspecified: Secondary | ICD-10-CM

## 2024-01-29 DIAGNOSIS — G40909 Epilepsy, unspecified, not intractable, without status epilepticus: Secondary | ICD-10-CM

## 2024-01-29 DIAGNOSIS — R1310 Dysphagia, unspecified: Secondary | ICD-10-CM

## 2024-02-08 DIAGNOSIS — R509 Fever, unspecified: Secondary | ICD-10-CM

## 2024-02-08 DIAGNOSIS — J189 Pneumonia, unspecified organism: Secondary | ICD-10-CM

## 2024-02-08 DIAGNOSIS — E870 Hyperosmolality and hypernatremia: Secondary | ICD-10-CM

## 2024-02-08 DIAGNOSIS — J9621 Acute and chronic respiratory failure with hypoxia: Secondary | ICD-10-CM

## 2024-02-22 DIAGNOSIS — G40909 Epilepsy, unspecified, not intractable, without status epilepticus: Secondary | ICD-10-CM

## 2024-02-22 DIAGNOSIS — R1310 Dysphagia, unspecified: Secondary | ICD-10-CM

## 2024-02-22 DIAGNOSIS — S069XAD Unspecified intracranial injury with loss of consciousness status unknown, subsequent encounter: Secondary | ICD-10-CM

## 2024-02-22 DIAGNOSIS — J9621 Acute and chronic respiratory failure with hypoxia: Secondary | ICD-10-CM

## 2024-02-22 DIAGNOSIS — D649 Anemia, unspecified: Secondary | ICD-10-CM

## 2024-02-22 DIAGNOSIS — E870 Hyperosmolality and hypernatremia: Secondary | ICD-10-CM

## 2024-03-07 DIAGNOSIS — E870 Hyperosmolality and hypernatremia: Secondary | ICD-10-CM

## 2024-03-07 DIAGNOSIS — D649 Anemia, unspecified: Secondary | ICD-10-CM

## 2024-03-07 DIAGNOSIS — R1310 Dysphagia, unspecified: Secondary | ICD-10-CM

## 2024-03-07 DIAGNOSIS — J189 Pneumonia, unspecified organism: Secondary | ICD-10-CM

## 2024-03-07 DIAGNOSIS — J9621 Acute and chronic respiratory failure with hypoxia: Secondary | ICD-10-CM

## 2024-03-07 DIAGNOSIS — G40909 Epilepsy, unspecified, not intractable, without status epilepticus: Secondary | ICD-10-CM

## 2024-03-21 DIAGNOSIS — S069XAD Unspecified intracranial injury with loss of consciousness status unknown, subsequent encounter: Secondary | ICD-10-CM

## 2024-03-21 DIAGNOSIS — D649 Anemia, unspecified: Secondary | ICD-10-CM

## 2024-03-21 DIAGNOSIS — G40909 Epilepsy, unspecified, not intractable, without status epilepticus: Secondary | ICD-10-CM

## 2024-03-21 DIAGNOSIS — E870 Hyperosmolality and hypernatremia: Secondary | ICD-10-CM

## 2024-03-21 DIAGNOSIS — J189 Pneumonia, unspecified organism: Secondary | ICD-10-CM

## 2024-03-21 DIAGNOSIS — R1310 Dysphagia, unspecified: Secondary | ICD-10-CM

## 2024-03-21 DIAGNOSIS — J9621 Acute and chronic respiratory failure with hypoxia: Secondary | ICD-10-CM

## 2024-03-28 ENCOUNTER — Other Ambulatory Visit: Payer: Self-pay | Admitting: Internal Medicine

## 2024-03-29 DIAGNOSIS — D649 Anemia, unspecified: Secondary | ICD-10-CM

## 2024-03-29 DIAGNOSIS — R1310 Dysphagia, unspecified: Secondary | ICD-10-CM

## 2024-03-29 DIAGNOSIS — E870 Hyperosmolality and hypernatremia: Secondary | ICD-10-CM

## 2024-03-29 DIAGNOSIS — J189 Pneumonia, unspecified organism: Secondary | ICD-10-CM

## 2024-03-29 DIAGNOSIS — J9621 Acute and chronic respiratory failure with hypoxia: Secondary | ICD-10-CM

## 2024-03-29 DIAGNOSIS — G40909 Epilepsy, unspecified, not intractable, without status epilepticus: Secondary | ICD-10-CM

## 2024-04-04 DIAGNOSIS — J189 Pneumonia, unspecified organism: Secondary | ICD-10-CM

## 2024-04-04 DIAGNOSIS — E870 Hyperosmolality and hypernatremia: Secondary | ICD-10-CM

## 2024-04-04 DIAGNOSIS — R1310 Dysphagia, unspecified: Secondary | ICD-10-CM

## 2024-04-04 DIAGNOSIS — J9621 Acute and chronic respiratory failure with hypoxia: Secondary | ICD-10-CM

## 2024-04-04 DIAGNOSIS — D649 Anemia, unspecified: Secondary | ICD-10-CM

## 2024-04-11 ENCOUNTER — Other Ambulatory Visit: Payer: Self-pay | Admitting: Internal Medicine

## 2024-04-11 DIAGNOSIS — J9621 Acute and chronic respiratory failure with hypoxia: Secondary | ICD-10-CM

## 2024-04-11 DIAGNOSIS — R1310 Dysphagia, unspecified: Secondary | ICD-10-CM

## 2024-04-11 DIAGNOSIS — J189 Pneumonia, unspecified organism: Secondary | ICD-10-CM

## 2024-04-11 DIAGNOSIS — G40909 Epilepsy, unspecified, not intractable, without status epilepticus: Secondary | ICD-10-CM

## 2024-04-11 DIAGNOSIS — E870 Hyperosmolality and hypernatremia: Secondary | ICD-10-CM

## 2024-04-11 DIAGNOSIS — D649 Anemia, unspecified: Secondary | ICD-10-CM

## 2024-04-18 DIAGNOSIS — J9621 Acute and chronic respiratory failure with hypoxia: Secondary | ICD-10-CM

## 2024-04-18 DIAGNOSIS — D649 Anemia, unspecified: Secondary | ICD-10-CM

## 2024-04-18 DIAGNOSIS — J189 Pneumonia, unspecified organism: Secondary | ICD-10-CM

## 2024-04-18 DIAGNOSIS — R1310 Dysphagia, unspecified: Secondary | ICD-10-CM

## 2024-04-18 DIAGNOSIS — G40909 Epilepsy, unspecified, not intractable, without status epilepticus: Secondary | ICD-10-CM

## 2024-04-18 DIAGNOSIS — E870 Hyperosmolality and hypernatremia: Secondary | ICD-10-CM

## 2024-04-25 DIAGNOSIS — J9621 Acute and chronic respiratory failure with hypoxia: Secondary | ICD-10-CM

## 2024-04-25 DIAGNOSIS — E870 Hyperosmolality and hypernatremia: Secondary | ICD-10-CM

## 2024-04-25 DIAGNOSIS — D649 Anemia, unspecified: Secondary | ICD-10-CM

## 2024-04-25 DIAGNOSIS — J189 Pneumonia, unspecified organism: Secondary | ICD-10-CM

## 2024-05-02 DIAGNOSIS — J9621 Acute and chronic respiratory failure with hypoxia: Secondary | ICD-10-CM

## 2024-05-02 DIAGNOSIS — J189 Pneumonia, unspecified organism: Secondary | ICD-10-CM

## 2024-05-02 DIAGNOSIS — E870 Hyperosmolality and hypernatremia: Secondary | ICD-10-CM

## 2024-05-02 DIAGNOSIS — R1310 Dysphagia, unspecified: Secondary | ICD-10-CM

## 2024-05-02 DIAGNOSIS — D649 Anemia, unspecified: Secondary | ICD-10-CM

## 2024-05-09 DIAGNOSIS — G40909 Epilepsy, unspecified, not intractable, without status epilepticus: Secondary | ICD-10-CM

## 2024-05-09 DIAGNOSIS — R1310 Dysphagia, unspecified: Secondary | ICD-10-CM

## 2024-05-09 DIAGNOSIS — J9621 Acute and chronic respiratory failure with hypoxia: Secondary | ICD-10-CM

## 2024-05-09 DIAGNOSIS — D649 Anemia, unspecified: Secondary | ICD-10-CM

## 2024-05-16 DIAGNOSIS — R1310 Dysphagia, unspecified: Secondary | ICD-10-CM

## 2024-05-16 DIAGNOSIS — G40909 Epilepsy, unspecified, not intractable, without status epilepticus: Secondary | ICD-10-CM

## 2024-05-16 DIAGNOSIS — J189 Pneumonia, unspecified organism: Secondary | ICD-10-CM

## 2024-05-16 DIAGNOSIS — J9621 Acute and chronic respiratory failure with hypoxia: Secondary | ICD-10-CM

## 2024-05-16 DIAGNOSIS — D649 Anemia, unspecified: Secondary | ICD-10-CM

## 2024-05-16 DIAGNOSIS — E870 Hyperosmolality and hypernatremia: Secondary | ICD-10-CM

## 2024-05-22 DIAGNOSIS — G40909 Epilepsy, unspecified, not intractable, without status epilepticus: Secondary | ICD-10-CM

## 2024-05-22 DIAGNOSIS — J9621 Acute and chronic respiratory failure with hypoxia: Secondary | ICD-10-CM

## 2024-05-22 DIAGNOSIS — R1310 Dysphagia, unspecified: Secondary | ICD-10-CM

## 2024-05-22 DIAGNOSIS — D649 Anemia, unspecified: Secondary | ICD-10-CM

## 2024-05-23 ENCOUNTER — Other Ambulatory Visit: Payer: Self-pay | Admitting: Internal Medicine

## 2024-05-30 DIAGNOSIS — R1310 Dysphagia, unspecified: Secondary | ICD-10-CM

## 2024-05-30 DIAGNOSIS — D649 Anemia, unspecified: Secondary | ICD-10-CM

## 2024-05-30 DIAGNOSIS — G40909 Epilepsy, unspecified, not intractable, without status epilepticus: Secondary | ICD-10-CM

## 2024-05-30 DIAGNOSIS — J9621 Acute and chronic respiratory failure with hypoxia: Secondary | ICD-10-CM

## 2024-06-06 DIAGNOSIS — R1310 Dysphagia, unspecified: Secondary | ICD-10-CM

## 2024-06-06 DIAGNOSIS — G40909 Epilepsy, unspecified, not intractable, without status epilepticus: Secondary | ICD-10-CM

## 2024-06-06 DIAGNOSIS — D649 Anemia, unspecified: Secondary | ICD-10-CM

## 2024-06-06 DIAGNOSIS — J9621 Acute and chronic respiratory failure with hypoxia: Secondary | ICD-10-CM

## 2024-06-13 DIAGNOSIS — D649 Anemia, unspecified: Secondary | ICD-10-CM

## 2024-06-13 DIAGNOSIS — J9621 Acute and chronic respiratory failure with hypoxia: Secondary | ICD-10-CM

## 2024-06-13 DIAGNOSIS — R1310 Dysphagia, unspecified: Secondary | ICD-10-CM

## 2024-06-13 DIAGNOSIS — G40909 Epilepsy, unspecified, not intractable, without status epilepticus: Secondary | ICD-10-CM

## 2024-06-20 DIAGNOSIS — D649 Anemia, unspecified: Secondary | ICD-10-CM

## 2024-06-20 DIAGNOSIS — J9621 Acute and chronic respiratory failure with hypoxia: Secondary | ICD-10-CM

## 2024-06-20 DIAGNOSIS — R1310 Dysphagia, unspecified: Secondary | ICD-10-CM

## 2024-06-20 DIAGNOSIS — G40909 Epilepsy, unspecified, not intractable, without status epilepticus: Secondary | ICD-10-CM

## 2024-06-21 ENCOUNTER — Other Ambulatory Visit: Payer: Self-pay | Admitting: Internal Medicine

## 2024-06-21 MED ORDER — MODAFINIL 100 MG PO TABS: 100.0000 mg | ORAL_TABLET | Freq: Every day | ORAL | 5 refills | Status: DC

## 2024-06-27 DIAGNOSIS — R1310 Dysphagia, unspecified: Secondary | ICD-10-CM

## 2024-06-27 DIAGNOSIS — G40909 Epilepsy, unspecified, not intractable, without status epilepticus: Secondary | ICD-10-CM

## 2024-06-27 DIAGNOSIS — J9621 Acute and chronic respiratory failure with hypoxia: Secondary | ICD-10-CM

## 2024-06-27 DIAGNOSIS — D649 Anemia, unspecified: Secondary | ICD-10-CM

## 2024-07-04 DIAGNOSIS — D649 Anemia, unspecified: Secondary | ICD-10-CM

## 2024-07-04 DIAGNOSIS — J9621 Acute and chronic respiratory failure with hypoxia: Secondary | ICD-10-CM

## 2024-07-04 DIAGNOSIS — G40909 Epilepsy, unspecified, not intractable, without status epilepticus: Secondary | ICD-10-CM

## 2024-07-04 DIAGNOSIS — R1310 Dysphagia, unspecified: Secondary | ICD-10-CM

## 2024-07-11 DIAGNOSIS — R1310 Dysphagia, unspecified: Secondary | ICD-10-CM

## 2024-07-11 DIAGNOSIS — J9621 Acute and chronic respiratory failure with hypoxia: Secondary | ICD-10-CM

## 2024-07-11 DIAGNOSIS — D649 Anemia, unspecified: Secondary | ICD-10-CM

## 2024-07-11 DIAGNOSIS — G40909 Epilepsy, unspecified, not intractable, without status epilepticus: Secondary | ICD-10-CM

## 2024-07-18 DIAGNOSIS — R1310 Dysphagia, unspecified: Secondary | ICD-10-CM

## 2024-07-18 DIAGNOSIS — J9621 Acute and chronic respiratory failure with hypoxia: Secondary | ICD-10-CM

## 2024-07-18 DIAGNOSIS — D649 Anemia, unspecified: Secondary | ICD-10-CM

## 2024-07-18 DIAGNOSIS — G40909 Epilepsy, unspecified, not intractable, without status epilepticus: Secondary | ICD-10-CM

## 2024-07-25 DIAGNOSIS — G40909 Epilepsy, unspecified, not intractable, without status epilepticus: Secondary | ICD-10-CM

## 2024-07-25 DIAGNOSIS — R1310 Dysphagia, unspecified: Secondary | ICD-10-CM

## 2024-07-25 DIAGNOSIS — J9621 Acute and chronic respiratory failure with hypoxia: Secondary | ICD-10-CM

## 2024-07-25 DIAGNOSIS — D649 Anemia, unspecified: Secondary | ICD-10-CM

## 2024-08-01 DIAGNOSIS — R1310 Dysphagia, unspecified: Secondary | ICD-10-CM

## 2024-08-01 DIAGNOSIS — G40909 Epilepsy, unspecified, not intractable, without status epilepticus: Secondary | ICD-10-CM

## 2024-08-01 DIAGNOSIS — J9621 Acute and chronic respiratory failure with hypoxia: Secondary | ICD-10-CM

## 2024-08-01 DIAGNOSIS — D649 Anemia, unspecified: Secondary | ICD-10-CM

## 2024-08-05 DIAGNOSIS — R1310 Dysphagia, unspecified: Secondary | ICD-10-CM

## 2024-08-05 DIAGNOSIS — G40909 Epilepsy, unspecified, not intractable, without status epilepticus: Secondary | ICD-10-CM

## 2024-08-05 DIAGNOSIS — J9621 Acute and chronic respiratory failure with hypoxia: Secondary | ICD-10-CM

## 2024-08-05 DIAGNOSIS — D649 Anemia, unspecified: Secondary | ICD-10-CM

## 2024-08-08 DIAGNOSIS — R1310 Dysphagia, unspecified: Secondary | ICD-10-CM

## 2024-08-08 DIAGNOSIS — R7881 Bacteremia: Secondary | ICD-10-CM

## 2024-08-08 DIAGNOSIS — J9621 Acute and chronic respiratory failure with hypoxia: Secondary | ICD-10-CM

## 2024-08-08 DIAGNOSIS — D649 Anemia, unspecified: Secondary | ICD-10-CM

## 2024-08-08 DIAGNOSIS — G40909 Epilepsy, unspecified, not intractable, without status epilepticus: Secondary | ICD-10-CM

## 2024-08-15 DIAGNOSIS — R7881 Bacteremia: Secondary | ICD-10-CM

## 2024-08-15 DIAGNOSIS — D649 Anemia, unspecified: Secondary | ICD-10-CM

## 2024-08-15 DIAGNOSIS — G40909 Epilepsy, unspecified, not intractable, without status epilepticus: Secondary | ICD-10-CM

## 2024-08-15 DIAGNOSIS — J9621 Acute and chronic respiratory failure with hypoxia: Secondary | ICD-10-CM

## 2024-08-15 DIAGNOSIS — R1310 Dysphagia, unspecified: Secondary | ICD-10-CM

## 2024-08-22 DIAGNOSIS — J9621 Acute and chronic respiratory failure with hypoxia: Secondary | ICD-10-CM

## 2024-08-22 DIAGNOSIS — R1310 Dysphagia, unspecified: Secondary | ICD-10-CM

## 2024-08-22 DIAGNOSIS — R7881 Bacteremia: Secondary | ICD-10-CM

## 2024-08-22 DIAGNOSIS — G40909 Epilepsy, unspecified, not intractable, without status epilepticus: Secondary | ICD-10-CM

## 2024-08-22 DIAGNOSIS — D649 Anemia, unspecified: Secondary | ICD-10-CM

## 2024-08-29 DIAGNOSIS — G40909 Epilepsy, unspecified, not intractable, without status epilepticus: Secondary | ICD-10-CM

## 2024-08-29 DIAGNOSIS — R1310 Dysphagia, unspecified: Secondary | ICD-10-CM

## 2024-08-29 DIAGNOSIS — J9621 Acute and chronic respiratory failure with hypoxia: Secondary | ICD-10-CM

## 2024-08-29 DIAGNOSIS — D649 Anemia, unspecified: Secondary | ICD-10-CM

## 2024-09-05 DIAGNOSIS — J9621 Acute and chronic respiratory failure with hypoxia: Secondary | ICD-10-CM

## 2024-09-05 DIAGNOSIS — G40909 Epilepsy, unspecified, not intractable, without status epilepticus: Secondary | ICD-10-CM

## 2024-09-05 DIAGNOSIS — D649 Anemia, unspecified: Secondary | ICD-10-CM

## 2024-09-05 DIAGNOSIS — R1310 Dysphagia, unspecified: Secondary | ICD-10-CM

## 2024-09-05 DIAGNOSIS — R7881 Bacteremia: Secondary | ICD-10-CM

## 2024-09-12 DIAGNOSIS — R1310 Dysphagia, unspecified: Secondary | ICD-10-CM

## 2024-09-12 DIAGNOSIS — J9621 Acute and chronic respiratory failure with hypoxia: Secondary | ICD-10-CM

## 2024-09-12 DIAGNOSIS — D649 Anemia, unspecified: Secondary | ICD-10-CM

## 2024-09-12 DIAGNOSIS — G40909 Epilepsy, unspecified, not intractable, without status epilepticus: Secondary | ICD-10-CM

## 2024-10-13 ENCOUNTER — Inpatient Hospital Stay (HOSPITAL_COMMUNITY)
Admission: EM | Admit: 2024-10-13 | Discharge: 2024-10-17 | DRG: 698 | Disposition: A | Discharge status: Other Acute Inpatient Hospital | Source: Skilled Nursing Facility | Attending: Internal Medicine | Admitting: Internal Medicine

## 2024-10-13 ENCOUNTER — Inpatient Hospital Stay (HOSPITAL_COMMUNITY)

## 2024-10-13 ENCOUNTER — Other Ambulatory Visit: Payer: Self-pay

## 2024-10-13 ENCOUNTER — Emergency Department (HOSPITAL_COMMUNITY)

## 2024-10-13 DIAGNOSIS — N390 Urinary tract infection, site not specified: Secondary | ICD-10-CM

## 2024-10-13 DIAGNOSIS — E8809 Other disorders of plasma-protein metabolism, not elsewhere classified: Secondary | ICD-10-CM | POA: Diagnosis present

## 2024-10-13 DIAGNOSIS — I2510 Atherosclerotic heart disease of native coronary artery without angina pectoris: Secondary | ICD-10-CM | POA: Diagnosis present

## 2024-10-13 DIAGNOSIS — L89316 Pressure-induced deep tissue damage of right buttock: Secondary | ICD-10-CM | POA: Diagnosis present

## 2024-10-13 DIAGNOSIS — Z79890 Hormone replacement therapy (postmenopausal): Secondary | ICD-10-CM | POA: Diagnosis not present

## 2024-10-13 DIAGNOSIS — L89326 Pressure-induced deep tissue damage of left buttock: Secondary | ICD-10-CM | POA: Diagnosis present

## 2024-10-13 DIAGNOSIS — A4151 Sepsis due to Escherichia coli [E. coli]: Secondary | ICD-10-CM | POA: Diagnosis not present

## 2024-10-13 DIAGNOSIS — Z87820 Personal history of traumatic brain injury: Secondary | ICD-10-CM

## 2024-10-13 DIAGNOSIS — Z930 Tracheostomy status: Secondary | ICD-10-CM

## 2024-10-13 DIAGNOSIS — Z885 Allergy status to narcotic agent status: Secondary | ICD-10-CM

## 2024-10-13 DIAGNOSIS — Z1152 Encounter for screening for COVID-19: Secondary | ICD-10-CM

## 2024-10-13 DIAGNOSIS — I10 Essential (primary) hypertension: Secondary | ICD-10-CM | POA: Diagnosis present

## 2024-10-13 DIAGNOSIS — A419 Sepsis, unspecified organism: Secondary | ICD-10-CM | POA: Diagnosis not present

## 2024-10-13 DIAGNOSIS — L89224 Pressure ulcer of left hip, stage 4: Secondary | ICD-10-CM | POA: Diagnosis present

## 2024-10-13 DIAGNOSIS — Z794 Long term (current) use of insulin: Secondary | ICD-10-CM | POA: Diagnosis not present

## 2024-10-13 DIAGNOSIS — E876 Hypokalemia: Secondary | ICD-10-CM | POA: Diagnosis present

## 2024-10-13 DIAGNOSIS — T83511A Infection and inflammatory reaction due to indwelling urethral catheter, initial encounter: Secondary | ICD-10-CM | POA: Diagnosis present

## 2024-10-13 DIAGNOSIS — E440 Moderate protein-calorie malnutrition: Secondary | ICD-10-CM | POA: Diagnosis present

## 2024-10-13 DIAGNOSIS — Z66 Do not resuscitate: Secondary | ICD-10-CM | POA: Diagnosis present

## 2024-10-13 DIAGNOSIS — R6521 Severe sepsis with septic shock: Secondary | ICD-10-CM | POA: Diagnosis present

## 2024-10-13 DIAGNOSIS — L89153 Pressure ulcer of sacral region, stage 3: Secondary | ICD-10-CM | POA: Diagnosis present

## 2024-10-13 DIAGNOSIS — L8990 Pressure ulcer of unspecified site, unspecified stage: Secondary | ICD-10-CM | POA: Diagnosis present

## 2024-10-13 DIAGNOSIS — R579 Shock, unspecified: Secondary | ICD-10-CM

## 2024-10-13 DIAGNOSIS — E119 Type 2 diabetes mellitus without complications: Secondary | ICD-10-CM | POA: Diagnosis present

## 2024-10-13 DIAGNOSIS — E039 Hypothyroidism, unspecified: Secondary | ICD-10-CM

## 2024-10-13 DIAGNOSIS — Z87891 Personal history of nicotine dependence: Secondary | ICD-10-CM

## 2024-10-13 DIAGNOSIS — Z87442 Personal history of urinary calculi: Secondary | ICD-10-CM

## 2024-10-13 DIAGNOSIS — Z880 Allergy status to penicillin: Secondary | ICD-10-CM

## 2024-10-13 DIAGNOSIS — Z1612 Extended spectrum beta lactamase (ESBL) resistance: Secondary | ICD-10-CM | POA: Diagnosis present

## 2024-10-13 DIAGNOSIS — Y846 Urinary catheterization as the cause of abnormal reaction of the patient, or of later complication, without mention of misadventure at the time of the procedure: Secondary | ICD-10-CM | POA: Diagnosis present

## 2024-10-13 DIAGNOSIS — Z79899 Other long term (current) drug therapy: Secondary | ICD-10-CM

## 2024-10-13 DIAGNOSIS — E870 Hyperosmolality and hypernatremia: Secondary | ICD-10-CM | POA: Diagnosis present

## 2024-10-13 DIAGNOSIS — Z6824 Body mass index (BMI) 24.0-24.9, adult: Secondary | ICD-10-CM

## 2024-10-13 DIAGNOSIS — Z888 Allergy status to other drugs, medicaments and biological substances status: Secondary | ICD-10-CM

## 2024-10-13 LAB — CBC WITH DIFFERENTIAL/PLATELET
Abs Immature Granulocytes: 0.13 K/uL — ABNORMAL HIGH (ref 0.00–0.07)
Basophils Absolute: 0.1 K/uL (ref 0.0–0.1)
Basophils Relative: 1 %
Eosinophils Absolute: 0 K/uL (ref 0.0–0.5)
Eosinophils Relative: 0 %
HCT: 36.7 % — ABNORMAL LOW (ref 39.0–52.0)
Hemoglobin: 10.3 g/dL — ABNORMAL LOW (ref 13.0–17.0)
Immature Granulocytes: 1 %
Lymphocytes Relative: 4 %
Lymphs Abs: 0.5 K/uL — ABNORMAL LOW (ref 0.7–4.0)
MCH: 25.5 pg — ABNORMAL LOW (ref 26.0–34.0)
MCHC: 28.1 g/dL — ABNORMAL LOW (ref 30.0–36.0)
MCV: 90.8 fL (ref 80.0–100.0)
Monocytes Absolute: 0.3 K/uL (ref 0.1–1.0)
Monocytes Relative: 2 %
Neutro Abs: 12.7 K/uL — ABNORMAL HIGH (ref 1.7–7.7)
Neutrophils Relative %: 92 %
Platelets: 402 K/uL — ABNORMAL HIGH (ref 150–400)
RBC: 4.04 MIL/uL — ABNORMAL LOW (ref 4.22–5.81)
RDW: 17.1 % — ABNORMAL HIGH (ref 11.5–15.5)
Smear Review: NORMAL
WBC: 13.7 K/uL — ABNORMAL HIGH (ref 4.0–10.5)
nRBC: 0 % (ref 0.0–0.2)

## 2024-10-13 LAB — BLOOD CULTURE ID PANEL (REFLEXED) - BCID2

## 2024-10-13 LAB — GLUCOSE, CAPILLARY
Glucose-Capillary: 329 mg/dL — ABNORMAL HIGH (ref 70–99)
Glucose-Capillary: 341 mg/dL — ABNORMAL HIGH (ref 70–99)
Glucose-Capillary: 280 mg/dL — ABNORMAL HIGH (ref 70–99)

## 2024-10-13 LAB — BLOOD GAS, VENOUS
Acid-Base Excess: 2.4 mmol/L — ABNORMAL HIGH (ref 0.0–2.0)
Bicarbonate: 28.8 mmol/L — ABNORMAL HIGH (ref 20.0–28.0)
O2 Saturation: 54.2 %
Patient temperature: 36.1
pCO2, Ven: 49 mmHg (ref 44–60)
pH, Ven: 7.37 (ref 7.25–7.43)
pO2, Ven: 31 mmHg — CL (ref 32–45)

## 2024-10-13 LAB — PROTIME-INR
INR: 1.3 — ABNORMAL HIGH (ref 0.8–1.2)
INR: 1.3 — ABNORMAL HIGH (ref 0.8–1.2)
Prothrombin Time: 16.8 s — ABNORMAL HIGH (ref 11.4–15.2)
Prothrombin Time: 16.6 s — ABNORMAL HIGH (ref 11.4–15.2)

## 2024-10-13 LAB — COMPREHENSIVE METABOLIC PANEL WITH GFR
ALT: 14 U/L (ref 0–44)
AST: 21 U/L (ref 15–41)
Albumin: 2.9 g/dL — ABNORMAL LOW (ref 3.5–5.0)
Alkaline Phosphatase: 182 U/L — ABNORMAL HIGH (ref 38–126)
Anion gap: 15 (ref 5–15)
BUN: 63 mg/dL — ABNORMAL HIGH (ref 8–23)
CO2: 28 mmol/L (ref 22–32)
Calcium: 9.4 mg/dL (ref 8.9–10.3)
Chloride: 107 mmol/L (ref 98–111)
Creatinine, Ser: 1.19 mg/dL (ref 0.61–1.24)
GFR, Estimated: >60 mL/min (ref >60)
Glucose, Bld: 233 mg/dL — ABNORMAL HIGH (ref 70–99)
Potassium: 3.3 mmol/L — ABNORMAL LOW (ref 3.5–5.1)
Sodium: 150 mmol/L — ABNORMAL HIGH (ref 135–145)
Total Bilirubin: 0.5 mg/dL (ref 0.0–1.2)
Total Protein: 7.4 g/dL (ref 6.5–8.1)

## 2024-10-13 LAB — BASIC METABOLIC PANEL WITH GFR
Anion gap: 14 (ref 5–15)
BUN: 62 mg/dL — ABNORMAL HIGH (ref 8–23)
CO2: 26 mmol/L (ref 22–32)
Calcium: 8.9 mg/dL (ref 8.9–10.3)
Chloride: 107 mmol/L (ref 98–111)
Creatinine, Ser: 1.19 mg/dL (ref 0.61–1.24)
GFR, Estimated: >60 mL/min (ref >60)
Glucose, Bld: 333 mg/dL — ABNORMAL HIGH (ref 70–99)
Potassium: 4.5 mmol/L (ref 3.5–5.1)
Sodium: 147 mmol/L — ABNORMAL HIGH (ref 135–145)

## 2024-10-13 LAB — URINALYSIS, W/ REFLEX TO CULTURE (INFECTION SUSPECTED)
Bilirubin Urine: NEGATIVE
Glucose, UA: NEGATIVE mg/dL
Ketones, ur: NEGATIVE mg/dL
Nitrite: NEGATIVE
Protein, ur: 100 mg/dL — AB
RBC / HPF: >50 RBC/hpf (ref 0–5)
Specific Gravity, Urine: 1.016 (ref 1.005–1.030)
WBC, UA: >50 WBC/hpf (ref 0–5)
pH: 5 (ref 5.0–8.0)

## 2024-10-13 LAB — HEMOGLOBIN A1C
Hgb A1c MFr Bld: 7.7 % — ABNORMAL HIGH (ref 4.8–5.6)
Mean Plasma Glucose: 174.29 mg/dL

## 2024-10-13 LAB — I-STAT CG4 LACTIC ACID, ED
Lactic Acid, Venous: 3.9 mmol/L (ref 0.5–1.9)
Lactic Acid, Venous: 5.1 mmol/L (ref 0.5–1.9)

## 2024-10-13 LAB — RESP PANEL BY RT-PCR (RSV, FLU A&B, COVID)  RVPGX2
Influenza A by PCR: NEGATIVE
Influenza B by PCR: NEGATIVE
Resp Syncytial Virus by PCR: NEGATIVE
SARS Coronavirus 2 by RT PCR: NEGATIVE

## 2024-10-13 LAB — TYPE AND SCREEN
ABO/RH(D): O POS
Antibody Screen: NEGATIVE

## 2024-10-13 LAB — PHOSPHORUS: Phosphorus: 2.4 mg/dL — ABNORMAL LOW (ref 2.5–4.6)

## 2024-10-13 LAB — LACTIC ACID, PLASMA
Lactic Acid, Venous: 3.4 mmol/L (ref 0.5–1.9)
Lactic Acid, Venous: 1.6 mmol/L (ref 0.5–1.9)

## 2024-10-13 LAB — ABO/RH: ABO/RH(D): O POS

## 2024-10-13 LAB — MRSA NEXT GEN BY PCR, NASAL: MRSA by PCR Next Gen: DETECTED — AB

## 2024-10-13 LAB — MAGNESIUM: Magnesium: 2.2 mg/dL (ref 1.7–2.4)

## 2024-10-13 LAB — CBG MONITORING, ED: Glucose-Capillary: 223 mg/dL — ABNORMAL HIGH (ref 70–99)

## 2024-10-13 MED ORDER — LACTATED RINGERS IV SOLN: INTRAVENOUS | Status: DC

## 2024-10-13 MED ORDER — LACTATED RINGERS IV BOLUS (SEPSIS)
1000.0000 mL | Freq: Once | INTRAVENOUS | Status: AC
  Administered 2024-10-13: 1000 mL via INTRAVENOUS

## 2024-10-13 MED ORDER — MODAFINIL 200 MG PO TABS
100.0000 mg | ORAL_TABLET | Freq: Every day | ORAL | Status: DC
  Administered 2024-10-14 – 2024-10-17 (×4): 100 mg
  Filled 2024-10-13 (×5): qty 1

## 2024-10-13 MED ORDER — PROSOURCE TF20 ENFIT COMPATIBL EN LIQD
60.0000 mL | Freq: Every day | ENTERAL | Status: DC
  Filled 2024-10-13: qty 60

## 2024-10-13 MED ORDER — ENOXAPARIN SODIUM 40 MG/0.4ML IJ SOSY
40.0000 mg | PREFILLED_SYRINGE | INTRAMUSCULAR | Status: DC
  Administered 2024-10-13 – 2024-10-17 (×5): 40 mg via SUBCUTANEOUS
  Filled 2024-10-13 (×5): qty 0.4

## 2024-10-13 MED ORDER — LACTATED RINGERS IV BOLUS
1000.0000 mL | Freq: Once | INTRAVENOUS | Status: AC
  Administered 2024-10-13: 1000 mL via INTRAVENOUS

## 2024-10-13 MED ORDER — VITAMIN C 500 MG PO TABS
500.0000 mg | ORAL_TABLET | Freq: Every day | ORAL | Status: DC
  Administered 2024-10-14 – 2024-10-17 (×4): 500 mg
  Filled 2024-10-13 (×4): qty 1

## 2024-10-13 MED ORDER — CHLORHEXIDINE GLUCONATE CLOTH 2 % EX PADS
6.0000 | MEDICATED_PAD | Freq: Every day | CUTANEOUS | Status: DC
  Administered 2024-10-13 – 2024-10-14 (×3): 6 via TOPICAL

## 2024-10-13 MED ORDER — FERROUS SULFATE 300 (60 FE) MG/5ML PO SYRP: 300.0000 mg | ORAL_SOLUTION | Freq: Two times a day (BID) | ORAL | Status: DC

## 2024-10-13 MED ORDER — POLYETHYLENE GLYCOL 3350 17 G PO PACK: 17.0000 g | PACK | Freq: Every day | ORAL | Status: DC | PRN

## 2024-10-13 MED ORDER — LEVETIRACETAM 500 MG PO TABS
500.0000 mg | ORAL_TABLET | Freq: Two times a day (BID) | ORAL | Status: DC
  Administered 2024-10-13 – 2024-10-17 (×8): 500 mg
  Filled 2024-10-13 (×8): qty 1

## 2024-10-13 MED ORDER — SODIUM CHLORIDE 0.9 % IV SOLN
250.0000 mL | INTRAVENOUS | Status: AC
  Administered 2024-10-13: 250 mL via INTRAVENOUS

## 2024-10-13 MED ORDER — AMANTADINE HCL 50 MG/5ML PO SOLN
100.0000 mg | Freq: Every day | ORAL | Status: DC
  Administered 2024-10-14 – 2024-10-17 (×4): 100 mg
  Filled 2024-10-13 (×5): qty 10

## 2024-10-13 MED ORDER — SERTRALINE HCL 50 MG PO TABS
25.0000 mg | ORAL_TABLET | Freq: Every day | ORAL | Status: DC
  Administered 2024-10-14 – 2024-10-17 (×4): 25 mg
  Filled 2024-10-13 (×4): qty 1

## 2024-10-13 MED ORDER — LEVOTHYROXINE SODIUM 100 MCG PO TABS
100.0000 ug | ORAL_TABLET | Freq: Every day | ORAL | Status: DC
  Administered 2024-10-14 – 2024-10-17 (×4): 100 ug
  Filled 2024-10-13 (×4): qty 1

## 2024-10-13 MED ORDER — DOCUSATE SODIUM 100 MG PO CAPS: 100.0000 mg | ORAL_CAPSULE | Freq: Two times a day (BID) | ORAL | Status: DC | PRN

## 2024-10-13 MED ORDER — LIOTHYRONINE SODIUM 5 MCG PO TABS
7.5000 ug | ORAL_TABLET | Freq: Two times a day (BID) | ORAL | Status: DC
  Administered 2024-10-13 – 2024-10-17 (×8): 7.5 ug
  Filled 2024-10-13 (×9): qty 2

## 2024-10-13 MED ORDER — MUPIROCIN 2 % EX OINT
1.0000 | TOPICAL_OINTMENT | Freq: Two times a day (BID) | CUTANEOUS | Status: DC
  Administered 2024-10-13 – 2024-10-17 (×9): 1 via NASAL
  Filled 2024-10-13: qty 22

## 2024-10-13 MED ORDER — VITAL HP 1.0 CAL PO LIQD: 1000.0000 mL | ORAL | Status: DC

## 2024-10-13 MED ORDER — INSULIN ASPART 100 UNIT/ML IJ SOLN
0.0000 [IU] | INTRAMUSCULAR | Status: DC
  Administered 2024-10-13 (×2): 11 [IU] via SUBCUTANEOUS
  Administered 2024-10-13: 8 [IU] via SUBCUTANEOUS
  Administered 2024-10-14 (×5): 11 [IU] via SUBCUTANEOUS
  Administered 2024-10-15: 8 [IU] via SUBCUTANEOUS
  Administered 2024-10-15: 11 [IU] via SUBCUTANEOUS
  Administered 2024-10-15: 5 [IU] via SUBCUTANEOUS
  Administered 2024-10-15 (×2): 8 [IU] via SUBCUTANEOUS
  Administered 2024-10-15: 5 [IU] via SUBCUTANEOUS
  Administered 2024-10-15: 8 [IU] via SUBCUTANEOUS
  Administered 2024-10-16: 11 [IU] via SUBCUTANEOUS
  Administered 2024-10-16 (×2): 5 [IU] via SUBCUTANEOUS
  Administered 2024-10-16 (×2): 3 [IU] via SUBCUTANEOUS
  Administered 2024-10-17: 5 [IU] via SUBCUTANEOUS
  Administered 2024-10-17: 2 [IU] via SUBCUTANEOUS
  Administered 2024-10-17: 5 [IU] via SUBCUTANEOUS
  Administered 2024-10-17: 2 [IU] via SUBCUTANEOUS
  Administered 2024-10-17 (×2): 5 [IU] via SUBCUTANEOUS

## 2024-10-13 MED ORDER — POTASSIUM CHLORIDE 20 MEQ PO PACK
40.0000 meq | PACK | Freq: Once | ORAL | Status: AC
  Administered 2024-10-13: 40 meq
  Filled 2024-10-13: qty 2

## 2024-10-13 MED ORDER — ACETAMINOPHEN 650 MG RE SUPP
650.0000 mg | Freq: Once | RECTAL | Status: AC
  Administered 2024-10-13: 650 mg via RECTAL
  Filled 2024-10-13: qty 1

## 2024-10-13 MED ORDER — PRO-STAT AWC PO LIQD: 30.0000 mL | Freq: Every day | ORAL | Status: DC

## 2024-10-13 MED ORDER — PIVOT 1.5 CAL PO LIQD
1000.0000 mL | ORAL | Status: DC
  Administered 2024-10-13 – 2024-10-16 (×4): 1000 mL
  Filled 2024-10-13 (×5): qty 1000

## 2024-10-13 MED ORDER — GERHARDT'S BUTT CREAM
TOPICAL_CREAM | Freq: Two times a day (BID) | CUTANEOUS | Status: DC
  Administered 2024-10-14 – 2024-10-17 (×5): 1 via TOPICAL
  Filled 2024-10-13: qty 60

## 2024-10-13 MED ORDER — LACTATED RINGERS IV BOLUS (SEPSIS)
500.0000 mL | Freq: Once | INTRAVENOUS | Status: AC
  Administered 2024-10-13: 500 mL via INTRAVENOUS

## 2024-10-13 MED ORDER — ACETAMINOPHEN 160 MG/5ML PO SOLN
650.0000 mg | Freq: Four times a day (QID) | ORAL | Status: DC | PRN
  Administered 2024-10-13 – 2024-10-15 (×4): 650 mg
  Filled 2024-10-13 (×4): qty 20.3

## 2024-10-13 MED ORDER — MIDODRINE HCL 5 MG PO TABS: 10.0000 mg | ORAL_TABLET | Freq: Three times a day (TID) | ORAL | Status: DC

## 2024-10-13 MED ORDER — NOREPINEPHRINE 4 MG/250ML-% IV SOLN
0.0000 ug/min | INTRAVENOUS | Status: DC
  Administered 2024-10-13: 5 ug/min via INTRAVENOUS
  Administered 2024-10-13: 10 ug/min via INTRAVENOUS
  Administered 2024-10-14: 3 ug/min via INTRAVENOUS
  Administered 2024-10-14: 10 ug/min via INTRAVENOUS
  Administered 2024-10-14: 4 ug/min via INTRAVENOUS
  Administered 2024-10-14: 8 ug/min via INTRAVENOUS
  Filled 2024-10-13 (×5): qty 250

## 2024-10-13 MED ORDER — SODIUM CHLORIDE 0.9 % IV SOLN
1.0000 g | Freq: Three times a day (TID) | INTRAVENOUS | Status: DC
  Administered 2024-10-13 – 2024-10-17 (×13): 1 g via INTRAVENOUS
  Filled 2024-10-13 (×14): qty 20

## 2024-10-13 MED ADMIN — Iohexol Inj 300 MG/ML: 100 mL | INTRAVENOUS | NDC 00407141363

## 2024-10-13 NOTE — ED Triage Notes (Signed)
 Possible sepsis, fever, tachy, from [facility], pt is responsive to pain occasionally per EMS who got report from RN at facility. On cipro for 1 week for UTI. Pt is on trach normally on room air per facility.  1L of LR given by EMS with pressure bag.

## 2024-10-13 NOTE — Progress Notes (Signed)
 Initial Nutrition Assessment  DOCUMENTATION CODES:   Non-severe (moderate) malnutrition in context of chronic illness  INTERVENTION:  - Initiate tube feeding via PEG: Pivot 1.5 at 60 ml/h (1440 ml per day) *Initiate at 26mL/hr and advance by 10mL Q8H Provides 2160 kcal, 135 gm protein, 1080 ml free water  daily  - Monitor magnesium , potassium, and phosphorus daily for at least 3 days, MD to replete as needed.  - FWF per CCM.  - Once more hemodynamically stable and off vasopressor support, transition to: Glucerna 1.5 at 60 ml/h (1440 ml per day) + recommend Q6H FWF  Provides 2160 kcal, 119 gm protein, 1693 ml free water  daily + 1 packet Juven BID, each packet provides 95 calories, 2.5 grams of protein (collagen), and 9.8 grams of carbohydrate (3 grams sugar); also contains 7 grams of L-arginine and L-glutamine, 300 mg vitamin C , 15 mg vitamin E, 1.2 mcg vitamin B-12, 9.5 mg zinc , 200 mg calcium, and 1.5 g  Calcium Beta-hydroxy-Beta-methylbutyrate to support wound healing. - TF and Juven together provide: 2350 kcals, 124 grams protein, and 2172mL/day  - Monitor weight trends.   NUTRITION DIAGNOSIS:   Moderate Malnutrition related to chronic illness as evidenced by mild fat depletion, mild muscle depletion.  GOAL:   Patient will meet greater than or equal to 90% of their needs  MONITOR:   Labs, Weight trends, TF tolerance  REASON FOR ASSESSMENT:   New TF, Rounds    ASSESSMENT:   73 year old male with history of TBI with baseline mental status is awake but not alert or verbal, hypertension, DMII, CAD and chronic tracheostomy who presented from [facility] with concern for sepsis due to urinary tract source.  Patient is nonverbal at baseline. No family/visitors at bedside at time of visit.   Per chart review, last weight is from January 2024 when patient was weighed at 174#. He was weighed this admission at 178#. As there is no weight history over the past year,  unable to determine any significant changes in weight recently.  Of note, during admit in January 2024 NFPE very similar to exam today. Patient has fat and muscle loss but this seems somewhat stable.  Per the home meds list, patient was getting Glucerna 1.5 @ 51mL/hr at his facilit + 300mL Q4H FWF. He was also getting 500mg  vitamin C  and an MVI. 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  Per discussion with CCM MD, can initiate tube feeds today. Will initiate Pivot 1.5 for now while patient on Levophed  with possible need to increase requirements and with notable wounds.  Once more stable, can transition back to Glucerna 1.5 @ 81mL/hr and add Juven to support wound healing.    Medications reviewed and include: 500mg  vitamin C  Levophed  @ 10 mcg/min  Labs reviewed:  Na 147 HA1C 9.2 (last checked 12/29/22)  NUTRITION - FOCUSED PHYSICAL EXAM:  Flowsheet Row Most Recent Value  Orbital Region Mild depletion  Upper Arm Region Mild depletion  Thoracic and Lumbar Region No depletion  Buccal Region No depletion  Temple Region Severe depletion  Clavicle Bone Region Mild depletion  Clavicle and Acromion Bone Region Mild depletion  Scapular Bone Region Unable to assess  Dorsal Hand No depletion  Patellar Region Severe depletion  [bedbound]  Anterior Thigh Region Severe depletion  [bedbound]  Posterior Calf Region Severe depletion  [bedbound]  Edema (RD Assessment) None  Hair Reviewed  Eyes Unable to assess  Mouth Unable to assess  Skin Reviewed  Nails Reviewed    Diet Order:   Diet Order             Diet NPO time specified  Diet effective now                   EDUCATION NEEDS:  Not appropriate for education at this time  Skin:  Skin Assessment: Skin Integrity Issues: Skin Integrity Issues:: 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  Estimated Nutritional Needs:  Kcal:  2200-2450 kcals Protein:  115-135 grams Fluid:  >/= 2.2L    Trude Ned RD, LDN Contact via Secure Chat.

## 2024-10-13 NOTE — Progress Notes (Deleted)
 PHARMACY - PHYSICIAN COMMUNICATION CRITICAL VALUE ALERT - BLOOD CULTURE IDENTIFICATION (BCID)  Joshua Haley is an 73 y.o. male who presented to [facility] on 10/13/2024 with a chief complaint of r/o sepsis.  Assessment:  2 out 4 BC + ESBL Ecoli   Name of physician (or Provider) Contacted: Ogan  Current antibiotics: Merrem   Changes to prescribed antibiotics recommended:  Patient is on recommended antibiotics - No changes needed  Results for orders placed or performed during the hospital encounter of 09/10/22  Blood Culture ID Panel (Reflexed) (Collected: 09/10/2022  7:09 PM)  Result Value Ref Range   Enterococcus faecalis NOT DETECTED NOT DETECTED   Enterococcus Faecium NOT DETECTED NOT DETECTED   Listeria monocytogenes NOT DETECTED NOT DETECTED   Staphylococcus species NOT DETECTED NOT DETECTED   Staphylococcus aureus (BCID) NOT DETECTED NOT DETECTED   Staphylococcus epidermidis NOT DETECTED NOT DETECTED   Staphylococcus lugdunensis NOT DETECTED NOT DETECTED   Streptococcus species NOT DETECTED NOT DETECTED   Streptococcus agalactiae NOT DETECTED NOT DETECTED   Streptococcus pneumoniae NOT DETECTED NOT DETECTED   Streptococcus pyogenes NOT DETECTED NOT DETECTED   A.calcoaceticus-baumannii NOT DETECTED NOT DETECTED   Bacteroides fragilis NOT DETECTED NOT DETECTED   Enterobacterales DETECTED (A) NOT DETECTED   Enterobacter cloacae complex NOT DETECTED NOT DETECTED   Escherichia coli NOT DETECTED NOT DETECTED   Klebsiella aerogenes NOT DETECTED NOT DETECTED   Klebsiella oxytoca NOT DETECTED NOT DETECTED   Klebsiella pneumoniae NOT DETECTED NOT DETECTED   Proteus species DETECTED (A) NOT DETECTED   Salmonella species NOT DETECTED NOT DETECTED   Serratia marcescens NOT DETECTED NOT DETECTED   Haemophilus influenzae NOT DETECTED NOT DETECTED   Neisseria meningitidis NOT DETECTED NOT DETECTED   Pseudomonas aeruginosa NOT DETECTED NOT DETECTED   Stenotrophomonas maltophilia NOT  DETECTED NOT DETECTED   Candida albicans NOT DETECTED NOT DETECTED   Candida auris NOT DETECTED NOT DETECTED   Candida glabrata NOT DETECTED NOT DETECTED   Candida krusei NOT DETECTED NOT DETECTED   Candida parapsilosis NOT DETECTED NOT DETECTED   Candida tropicalis NOT DETECTED NOT DETECTED   Cryptococcus neoformans/gattii NOT DETECTED NOT DETECTED   CTX-M ESBL NOT DETECTED NOT DETECTED   Carbapenem resistance IMP NOT DETECTED NOT DETECTED   Carbapenem resistance KPC NOT DETECTED NOT DETECTED   Carbapenem resistance NDM NOT DETECTED NOT DETECTED   Carbapenem resist OXA 48 LIKE NOT DETECTED NOT DETECTED   Carbapenem resistance VIM NOT DETECTED NOT DETECTED   Matson Welch Karoline Marina, PharmD, BCPS Clinical Staff Pharmacist Marina Salines [place]illinger 10/13/2024  8:29 PM

## 2024-10-13 NOTE — H&P (Addendum)
 NAME:  Joshua Haley, MRN:  [id number], DOB:  1951-10-26, LOS: 0 ADMISSION DATE:  10/13/2024, CONSULTATION DATE:  10/13/24 REFERRING MD:  EDP CHIEF COMPLAINT:  Shock, Sepsis   History of Present Illness:  Joshua Haley is a 73 year old male with history of TBI with baseline mental status is awake but not alert or verbal, hypertension, DMII, CAD and chronic tracheostomy who presented to [facility] ER from [facility] with concern for sepsis due to urinary tract source. He was treated with ciprofloxacin starting on 10/13 and a urine culture from [facility] is growing ESBL E. Coli.   In ER, patient remained hypotensive despite fluid bolus resuscitation. He was started on peripheral levophed . Blood cultures were drawn, foley exchanged and urine culture sent and started on meropenem  antibiotic.   Unable to obtain history from patient as he is non-verbal. PCCM called for admission for septic shock  Pertinent  Medical History   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    CAD (coronary artery disease)    Diabetes mellitus without complication (HCC)    Hypertension    Hypothyroidism    Kidney stone    TBI (traumatic brain injury) (HCC)    Tracheostomy in place [facility]    Significant Hospital Events: Including procedures, antibiotic start and stop dates in addition to other pertinent events   10/17 admitted to [facility] hospital for septic shock due to UTI  Interim History / Subjective:  As above  Objective    Blood pressure 92/62, pulse (!) 130, temperature (!) 103.1 F (39.5 C), temperature source Rectal, resp. rate 20, SpO2 100%.        Intake/Output Summary (Last 24 hours) at 10/13/2024 0900 Last data filed at 10/13/2024 0835 Gross per 24 hour  Intake 1590.62 ml  Output --  Net 1590.62 ml   There were no vitals filed for this visit.  Examination: General: elderly male, chronically ill appearing, diaphoretic HENT: [place]/AT, dry mucous membranes Lungs:  clear to auscultation, no wheezing Cardiovascular: tachycardic, no murmurs Abdomen: soft, non-distended, BS+ Extremities: warm, no edema Neuro: PERRL, no alert, does not follow commands GU: foley in place, dark/cloudy urine  Na 150 K 3.3 Glucose 233 Alk phos 182 Albumin 2.9 Lactic Acid 3.9 to 5.1 WBC 13.7  Resolved problem list   Assessment and Plan   Septic Shock due to ESBL E. Coli UTI - chronic foley exchanged - follow up blood and urine culture results - UTI present on admission based on culture from [facility] 10/16 - continue meropenem  - trend lactic acid - received fluid bolus in ER, continue LR 137mL/hr for today - MAP goal 65 or greater - Continue vasopressor support - continue home midodrine   Hypernatremia Hypokalemia - free water  deficit 2.82L - Will repeat BMP this afternoon and adjust fluids as needed - replete potassium per tube  Sacral Decubitus Ulcer Left Hip Wound, Deep - present on admission, pictures in media tab - wound care consult  Chronic trach - present for airway protection - continue red cap  TBI - chronic  - continue modafinil  and sertraline   Hypothyroidism - continue home meds, synthroid  and liothyronine   Labs   CBC: Recent Labs  Lab 10/13/24 0705  WBC 13.7*  NEUTROABS 12.7*  HGB 10.3*  HCT 36.7*  MCV 90.8  PLT 402*    Basic Metabolic Panel: Recent Labs  Lab 10/13/24 0705  NA 150*  K 3.3*  CL 107  CO2 28  GLUCOSE 233*  BUN 63*  CREATININE 1.19  CALCIUM 9.4   GFR: CrCl cannot be calculated (Unknown ideal weight.). Recent Labs  Lab 10/13/24 0705 10/13/24 0815  WBC 13.7*  --   LATICACIDVEN  --  3.9*    Liver Function Tests: Recent Labs  Lab 10/13/24 0705  AST 21  ALT 14  ALKPHOS 182*  BILITOT 0.5  PROT 7.4  ALBUMIN 2.9*   No results for input(s): LIPASE, AMYLASE in the last 168 hours. No results for input(s): AMMONIA in the last 168 hours.  ABG    Component Value Date/Time   PHART  7.455 (H) 01/21/2023 0956   PCO2ART 37.9 01/21/2023 0956   PO2ART 87 01/21/2023 0956   HCO3 26.7 01/21/2023 0956   TCO2 28 01/21/2023 0956   O2SAT 97 01/21/2023 0956     Coagulation Profile: Recent Labs  Lab 10/13/24 0705  INR 1.3*    Cardiac Enzymes: No results for input(s): CKTOTAL, CKMB, CKMBINDEX, TROPONINI in the last 168 hours.  HbA1C: Hgb A1c MFr Bld  Date/Time Value Ref Range Status  12/29/2022 12:07 AM 9.2 (H) 4.8 - 5.6 % Final    Comment:    (NOTE)         Prediabetes: 5.7 - 6.4         Diabetes: >6.4         Glycemic control for adults with diabetes: <7.0   06/29/2022 06:33 AM 7.5 (H) 4.8 - 5.6 % Final    Comment:    (NOTE) Pre diabetes:          5.7%-6.4%  Diabetes:              >6.4%  Glycemic control for   <7.0% adults with diabetes     CBG: Recent Labs  Lab 10/13/24 0710  GLUCAP 223*    Review of Systems:   Unable to perform review of systems due to mental status  Past Medical History:  He,  has a past medical history of Acute on chronic urinary retention, CAD (coronary artery disease), Diabetes mellitus without complication (HCC), Hypertension, Hypothyroidism, Kidney stone, TBI (traumatic brain injury) (HCC), and Tracheostomy in place [facility]).   Surgical History:   Past Surgical History:  Procedure Laterality Date   CHOLECYSTECTOMY     CRANIECTOMY     JEJUNOSTOMY FEEDING TUBE     LEFT HEART CATH AND CORONARY ANGIOGRAPHY N/A 01/22/2020   Procedure: LEFT HEART CATH AND CORONARY ANGIOGRAPHY and possible PCI and stent;  Surgeon: Florencio Cara BIRCH, MD;  Location: [facility] INVASIVE CV LAB;  Service: Cardiovascular;  Laterality: N/A;   tracheostomy       Social History:   reports that he has quit smoking. He has never used smokeless tobacco. He reports that he does not currently use alcohol.   Family History:  His family history is not on file.   Allergies Allergies  Allergen Reactions   Morphine And Codeine    Penicillins Other  (See Comments)    Has tolerated Cefepime  and Ceftriaxone    Statins Nausea And Vomiting     Home Medications  Prior to Admission medications   Medication Sig Start Date End Date Taking? Authorizing Provider  amantadine  (SYMMETREL ) 50 MG/5ML solution Place 100 mg into feeding tube daily.    [provider]  Amino Acids-Protein Hydrolys (PRO-STAT AWC) LIQD Place 30 mLs into feeding tube daily.    [provider]  ascorbic acid  (VITAMIN C ) 500 MG tablet Place 500 mg into feeding tube daily. Give 500mg  per  tube daily    [provider]  cefUROXime  (CEFTIN ) 500 MG tablet Place 1 tablet (500 mg total) into feeding tube 2 (two) times daily with a meal. 10/31/23   Keith Sor, PA-C  enoxaparin  (LOVENOX ) 40 MG/0.4ML injection Inject 40 mg into the skin daily.    [provider]  ferrous sulfate  300 (60 Fe) MG/5ML syrup Place 300 mg into feeding tube 2 (two) times daily.    [provider]  furosemide  (LASIX ) 20 MG tablet Place 20 mg into feeding tube 2 (two) times daily.    [provider]  insulin  aspart (NOVOLOG ) 100 UNIT/ML injection insulin  aspart (novoLOG ) injection 0-15 Units 0-15 Units, Subcutaneous, Every 4 hours,  CBG < 70: Implement Hypoglycemia Standing Orders and refer to Hypoglycemia Standing Orders sidebar report CBG 70 - 120: 0 units CBG 121 - 150: 2 units CBG 151 - 200: 3 units CBG 201 - 250: 5 units CBG 251 - 300: 8 units CBG 301 - 350: 11 units CBG 351 - 400: 15 units CBG > 400: call MD Patient taking differently: Inject 0-21 Units into the skin in the morning, at noon, in the evening, and at bedtime. Sliding scale insulin : Blood sugar is <60 or >500, notify MD. Blood sugar is 0-150= 0 units Blood sugar is 151-200= 3 units Blood sugar is 201-250= 6 units Blood sugar is 251-300= 9 units Blood sugar is 301-350= 12 units Blood sugar is 351-400= 15 units Blood sugar is 401-450= 18 units Blood sugar is 451-500= 21 units 09/25/22    Ghimire, Donalda HERO, MD  insulin  glargine (LANTUS ) 100 UNIT/ML injection Inject 0.25 mLs (25 Units total) into the skin daily. Patient taking differently: Inject 30 Units into the skin daily. 09/25/22   Ghimire, Donalda HERO, MD  lansoprazole (PREVACID) 30 MG capsule Give 1 capsule (30mg ) per G tube daily    [provider]  leptospermum manuka honey (MEDIHONEY) PSTE paste Apply 1 Application topically daily. 12/31/22   Lemon Raisin, MD  levETIRAcetam  (KEPPRA ) 500 MG tablet Place 1 tablet (500 mg total) into feeding tube 2 (two) times daily. 01/27/23   Nguyen, Quan, DO  levothyroxine  (SYNTHROID ) 100 MCG tablet Place 100 mcg into feeding tube daily. Give 100mcg per tube daily    [provider]  liothyronine  (CYTOMEL ) 5 MCG tablet Place 7.5 mcg into feeding tube every 12 (twelve) hours. Give one and one half tablet (7.5mg ) per tube every 12 hours    [provider]  Methylcellulose, Laxative, (CITRUCEL) 500 MG TABS Place 500 mg into feeding tube 3 (three) times daily.    [provider]  metoprolol  tartrate (LOPRESSOR ) 25 MG tablet Place 12.5 mg into feeding tube 2 (two) times daily.    [provider]  midodrine  (PROAMATINE ) 10 MG tablet 1 tablet (10 mg total) by Per NG tube route 3 (three) times daily with meals. 01/27/23   Nguyen, Quan, DO  modafinil  (PROVIGIL ) 100 MG tablet Place 1 tablet (100 mg total) into feeding tube daily. 06/21/24   Fleeta Valeria Mayo, MD  Multiple Vitamin (MULTIVITAMIN) LIQD Place 15 mLs into feeding tube daily. Patient taking differently: Place into feeding tube daily. Give 1 tablet 12/31/22   Lemon Raisin, MD  Nutritional Supplements KENDAL) PACK Place 1 packet into feeding tube 2 (two) times daily. Arginaid 4.5gram-156mg /9.2 gram oral powder packet    [provider]  Nutritional Supplements (FEEDING SUPPLEMENT, GLUCERNA 1.5 CAL,) LIQD Place 60 mL/hr into feeding tube See admin instructions. Give 60mL/hr per tube  by shift     [provider]  omega-3 acid ethyl esters (LOVAZA ) 1 g capsule Place 1 g into feeding tube daily. Give 1 g per tube daily    [provider]  ondansetron  (ZOFRAN -ODT) 4 MG disintegrating tablet 4 mg every 6 (six) hours as needed for nausea or vomiting.    [provider]  polyethylene glycol (MIRALAX  / GLYCOLAX ) 17 g packet Give 17g per tube daily as needed for constipation    [provider]  sertraline  (ZOLOFT ) 25 MG tablet Place 25 mg into feeding tube daily. Give 25mg  per tube daily    [provider]  sodium chloride  HYPERTONIC 3 % nebulizer solution Take 4 mLs by nebulization 2 (two) times daily. Patient not taking: Reported on 01/18/2023 12/30/22   Lemon Raisin, MD  sodium chloride  irrigation 0.9 % irrigation Irrigate with 60 mLs as directed every 8 (eight) hours. Irrigate indwelling foley with 60mL sterile saline every 8 hours.    [provider]  Water  For Irrigation, Sterile (FREE WATER ) SOLN Place 300 mLs into feeding tube every 4 (four) hours. Patient not taking: Reported on 01/18/2023 12/30/22   Lemon Raisin, MD     Critical care time: 40 minutes    CRITICAL CARE Performed by: Dorn KATHEE Chill   Total critical care time: 40 minutes  Critical care time was exclusive of separately billable procedures and treating other patients.  Critical care was necessary to treat or prevent imminent or life-threatening deterioration.  Critical care was time spent personally by me on the following activities: development of treatment plan with patient and/or surrogate as well as nursing, discussions with consultants, evaluation of patient's response to treatment, examination of patient, obtaining history from patient or surrogate, ordering and performing treatments and interventions, ordering and review of laboratory studies, ordering and review of radiographic studies, pulse oximetry, re-evaluation of patient's condition and participation in  multidisciplinary rounds.  Dorn Chill, MD  Pulmonary & Critical Care Office: [phone]   See Amion for personal pager PCCM on call pager [phone] until 7pm. Please call Elink 7p-7a. [phone]

## 2024-10-13 NOTE — ED Provider Notes (Signed)
 [place] EMERGENCY DEPARTMENT AT [facility] Provider Note   CSN: [id number] Arrival date & time: 10/13/24  9366     Patient presents with: Code Sepsis   Joshua Haley is a 73 y.o. male with a history of diabetes, hypertension, TBI, tracheostomy, presents to the ER from [facility] for evaluation of worsening mentation and fever from possible UTI.  Patient was currently be treated with Cipro for UTI however was not improving.  His baseline mentation is that he says sometimes response to painful stimuli however now no longer was.  He was found to be febrile at [facility] he requested to be transferred over to [facility] for potential urologic complication.  Patient has DNR paperwork at bedside.  HPI     Prior to Admission medications   Medication Sig Start Date End Date Taking? Authorizing Provider  amantadine  (SYMMETREL ) 50 MG/5ML solution Place 100 mg into feeding tube daily.    [provider]  Amino Acids-Protein Hydrolys (PRO-STAT AWC) LIQD Place 30 mLs into feeding tube daily.    [provider]  ascorbic acid  (VITAMIN C ) 500 MG tablet Place 500 mg into feeding tube daily. Give 500mg  per tube daily    [provider]  cefUROXime  (CEFTIN ) 500 MG tablet Place 1 tablet (500 mg total) into feeding tube 2 (two) times daily with a meal. 10/31/23   Keith Sor, PA-C  enoxaparin  (LOVENOX ) 40 MG/0.4ML injection Inject 40 mg into the skin daily.    [provider]  ferrous sulfate  300 (60 Fe) MG/5ML syrup Place 300 mg into feeding tube 2 (two) times daily.    [provider]  furosemide  (LASIX ) 20 MG tablet Place 20 mg into feeding tube 2 (two) times daily.    [provider]  insulin  aspart (NOVOLOG ) 100 UNIT/ML injection insulin  aspart (novoLOG ) injection 0-15 Units 0-15 Units, Subcutaneous, Every 4 hours,  CBG < 70: Implement Hypoglycemia Standing Orders and refer to Hypoglycemia Standing Orders sidebar report CBG 70 - 120: 0  units CBG 121 - 150: 2 units CBG 151 - 200: 3 units CBG 201 - 250: 5 units CBG 251 - 300: 8 units CBG 301 - 350: 11 units CBG 351 - 400: 15 units CBG > 400: call MD Patient taking differently: Inject 0-21 Units into the skin in the morning, at noon, in the evening, and at bedtime. Sliding scale insulin : Blood sugar is <60 or >500, notify MD. Blood sugar is 0-150= 0 units Blood sugar is 151-200= 3 units Blood sugar is 201-250= 6 units Blood sugar is 251-300= 9 units Blood sugar is 301-350= 12 units Blood sugar is 351-400= 15 units Blood sugar is 401-450= 18 units Blood sugar is 451-500= 21 units 09/25/22   Ghimire, Donalda HERO, MD  insulin  glargine (LANTUS ) 100 UNIT/ML injection Inject 0.25 mLs (25 Units total) into the skin daily. Patient taking differently: Inject 30 Units into the skin daily. 09/25/22   Ghimire, Donalda HERO, MD  lansoprazole (PREVACID) 30 MG capsule Give 1 capsule (30mg ) per G tube daily    [provider]  leptospermum manuka honey (MEDIHONEY) PSTE paste Apply 1 Application topically daily. 12/31/22   Lemon Raisin, MD  levETIRAcetam  (KEPPRA ) 500 MG tablet Place 1 tablet (500 mg total) into feeding tube 2 (two) times daily. 01/27/23   Nguyen, Quan, DO  levothyroxine  (SYNTHROID ) 100 MCG tablet Place 100 mcg into feeding tube daily. Give 100mcg per tube daily    [provider]  liothyronine  (CYTOMEL ) 5 MCG tablet  Place 7.5 mcg into feeding tube every 12 (twelve) hours. Give one and one half tablet (7.5mg ) per tube every 12 hours    [provider]  Methylcellulose, Laxative, (CITRUCEL) 500 MG TABS Place 500 mg into feeding tube 3 (three) times daily.    [provider]  metoprolol  tartrate (LOPRESSOR ) 25 MG tablet Place 12.5 mg into feeding tube 2 (two) times daily.    [provider]  midodrine  (PROAMATINE ) 10 MG tablet 1 tablet (10 mg total) by Per NG tube route 3 (three) times daily with meals. 01/27/23   Nguyen, Quan, DO  modafinil   (PROVIGIL ) 100 MG tablet Place 1 tablet (100 mg total) into feeding tube daily. 06/21/24   Fleeta Valeria Mayo, MD  Multiple Vitamin (MULTIVITAMIN) LIQD Place 15 mLs into feeding tube daily. Patient taking differently: Place into feeding tube daily. Give 1 tablet 12/31/22   Lemon Raisin, MD  Nutritional Supplements KENDAL) PACK Place 1 packet into feeding tube 2 (two) times daily. Arginaid 4.5gram-156mg /9.2 gram oral powder packet    [provider]  Nutritional Supplements (FEEDING SUPPLEMENT, GLUCERNA 1.5 CAL,) LIQD Place 60 mL/hr into feeding tube See admin instructions. Give 3mL/hr per tube by shift    [provider]  omega-3 acid ethyl esters (LOVAZA ) 1 g capsule Place 1 g into feeding tube daily. Give 1 g per tube daily    [provider]  ondansetron  (ZOFRAN -ODT) 4 MG disintegrating tablet 4 mg every 6 (six) hours as needed for nausea or vomiting.    [provider]  polyethylene glycol (MIRALAX  / GLYCOLAX ) 17 g packet Give 17g per tube daily as needed for constipation    [provider]  sertraline  (ZOLOFT ) 25 MG tablet Place 25 mg into feeding tube daily. Give 25mg  per tube daily    [provider]  sodium chloride  HYPERTONIC 3 % nebulizer solution Take 4 mLs by nebulization 2 (two) times daily. Patient not taking: Reported on 01/18/2023 12/30/22   Lemon Raisin, MD  sodium chloride  irrigation 0.9 % irrigation Irrigate with 60 mLs as directed every 8 (eight) hours. Irrigate indwelling foley with 60mL sterile saline every 8 hours.    [provider]  Water  For Irrigation, Sterile (FREE WATER ) SOLN Place 300 mLs into feeding tube every 4 (four) hours. Patient not taking: Reported on 01/18/2023 12/30/22   Lemon Raisin, MD    Allergies: Morphine and codeine, Penicillins, and Statins    Review of Systems  Updated Vital Signs BP (!) 110/56   Pulse (!) 132   Temp (!) 103.1 F (39.5 C) (Rectal)   Resp 17   SpO2 100%   Physical  Exam Vitals and nursing note reviewed. Exam conducted with a chaperone present.  Constitutional:      Appearance: He is ill-appearing.  HENT:     Mouth/Throat:     Mouth: Mucous membranes are dry.  Cardiovascular:     Rate and Rhythm: Tachycardia present.  Pulmonary:     Effort: Pulmonary effort is normal. No respiratory distress.  Abdominal:     Palpations: Abdomen is soft.     Tenderness: There is no abdominal tenderness.     Comments: Does not appear to have any pain upon palpation of the abdomen.  Soft.  Genitourinary:    Comments: Tunneling sacral decubitus wound present.  Please see media tab.  This appeared to be filled with firmer stool. Skin:    General: Skin is warm and dry.  Neurological:     Comments: Does  respond to some painful stimuli     (all labs ordered are listed, but only abnormal results are displayed) Labs Reviewed  COMPREHENSIVE METABOLIC PANEL WITH GFR - Abnormal; Notable for the following components:      Result Value   Sodium 150 (*)    Potassium 3.3 (*)    Glucose, Bld 233 (*)    BUN 63 (*)    Albumin 2.9 (*)    Alkaline Phosphatase 182 (*)    All other components within normal limits  CBC WITH DIFFERENTIAL/PLATELET - Abnormal; Notable for the following components:   WBC 13.7 (*)    RBC 4.04 (*)    Hemoglobin 10.3 (*)    HCT 36.7 (*)    MCH 25.5 (*)    MCHC 28.1 (*)    RDW 17.1 (*)    Platelets 402 (*)    Neutro Abs 12.7 (*)    Lymphs Abs 0.5 (*)    Abs Immature Granulocytes 0.13 (*)    All other components within normal limits  PROTIME-INR - Abnormal; Notable for the following components:   Prothrombin Time 16.8 (*)    INR 1.3 (*)    All other components within normal limits  I-STAT CG4 LACTIC ACID, ED - Abnormal; Notable for the following components:   Lactic Acid, Venous 3.9 (*)    All other components within normal limits  CBG MONITORING, ED - Abnormal; Notable for the following components:   Glucose-Capillary 223 (*)    All  other components within normal limits  RESP PANEL BY RT-PCR (RSV, FLU A&B, COVID)  RVPGX2  CULTURE, BLOOD (ROUTINE X 2)  CULTURE, BLOOD (ROUTINE X 2)  MRSA NEXT GEN BY PCR, NASAL  URINE CULTURE  URINALYSIS, W/ REFLEX TO CULTURE (INFECTION SUSPECTED)  BLOOD GAS, VENOUS  PROTIME-INR  URINALYSIS, ROUTINE W REFLEX MICROSCOPIC  I-STAT CG4 LACTIC ACID, ED  TYPE AND SCREEN    EKG: None  Radiology: DG Chest Port 1 View Result Date: 10/13/2024 EXAM: 1 VIEW(S) XRAY OF THE CHEST 10/13/2024 09:28:00 AM COMPARISON: Comparison 01/22/2023. CLINICAL HISTORY: Questionable sepsis - evaluate for abnormality. Per chart - Possible sepsis, fever, tachy, from [facility], pt is responsive to pain occasionally per EMS who got report from RN at facility. FINDINGS: LUNGS AND PLEURA: No focal pulmonary opacity. No pulmonary edema. No pleural effusion. No pneumothorax. 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 Sternotomy wires are noted. BONES AND SOFT TISSUES: No acute osseous abnormality. IMPRESSION: 1. No acute cardiopulmonary disease. Electronically signed by: Lynwood Seip MD 10/13/2024 09:45 AM EDT RP Workstation: [id number]   .Critical Care  Performed by: Bernis Ernst, PA-C Authorized by: Bernis Ernst, PA-C   Critical care provider statement:    Critical care time (minutes):  60   Critical care was necessary to treat or prevent imminent or life-threatening deterioration of the following conditions:  Sepsis and shock   Critical care was time spent personally by me on the following activities:  Development of treatment plan with patient or surrogate, discussions with consultants, evaluation of patient's response to treatment, examination of patient, ordering and review of laboratory studies, ordering and review of radiographic studies, ordering and performing treatments and interventions, pulse oximetry, re-evaluation of patient's condition and review of old charts   Care discussed with: admitting  provider      Medications Ordered in the ED  lactated ringers  infusion ( Intravenous New Bag/Given 10/13/24 0825)  meropenem  (MERREM ) 1 g in sodium chloride  0.9 % 100 mL IVPB (0 g Intravenous Stopped  10/13/24 0819)  0.9 %  sodium chloride  infusion (250 mLs Intravenous Not Given 10/13/24 0856)  norepinephrine  (LEVOPHED ) 4mg  in (0.016 mg/mL) premix infusion (5 mcg/min Intravenous New Bag/Given 10/13/24 0905)  Chlorhexidine  Gluconate Cloth 2 % PADS 6 each (has no administration in time range)  docusate sodium  (COLACE) capsule 100 mg (has no administration in time range)  polyethylene glycol (MIRALAX  / GLYCOLAX ) packet 17 g (has no administration in time range)  enoxaparin  (LOVENOX ) injection 40 mg (has no administration in time range)  iohexol  (OMNIPAQUE ) 300 MG/ML solution 100 mL (has no administration in time range)  lactated ringers  bolus 1,000 mL (0 mLs Intravenous Stopped 10/13/24 0821)    And  lactated ringers  bolus 500 mL (0 mLs Intravenous Stopped 10/13/24 0835)  acetaminophen  (TYLENOL ) suppository 650 mg (650 mg Rectal Given 10/13/24 0710)    Medical Decision Making Amount and/or Complexity of Data Reviewed Labs: ordered. Radiology: ordered.  Risk OTC drugs. Prescription drug management. Decision regarding hospitalization.   73 y.o. male presents to the ER for evaluation of sepsis. Differential diagnosis includes but is not limited to sepsis, dehydration, UTI, pyelonephritis. Vital signs febrile, tachycardic, hypotensive, requiring additional oxygenation for hypoxia with trach.  Not vent dependent. Physical exam as noted above.    History is limited due to patient's baseline mentation.  From the paperwork that patient brought in, please see media tab, it appears that his urine grew out multi resistant to medications.  Does appear that it susceptible to meropenem .  I have consulted pharmacy and spoke with Cathlyn who agrees with giving meropenem  given that it is sensitive to  this and will help with other potential coinfection's.  He has had this medication before.  On presentation, patient was initiated with sepsis workup with labs.  Upon removing his bandage, does appear he has a tunneling wound that has some from a fecal matter present in the wound.  Adhesive was intact out of the bandage however his bandage was full of stool.  Concerned that he now has a stool leakage out of this wound bed.  Will order CT scan.  I independently reviewed and interpreted the patient's labs.  Mildly elevated PT and INR.  CBG of 223.  CMP shows a sodium 150, potassium 3.3, glucose of 233, BUN of 63.  Albumin 2.9 with alk phos of 182.  No other electrolyte or LFT abnormality.  CBC shows white blood cell count at 13.7 with a hemoglobin of 10.3 hematocrit 36.7.  Lactic acid pending.  Blood cultures pending.  I consulted nephrology based on the hyponatremia, recommended continuing with fluids.  Chest x-ray 1. No acute cardiopulmonary disease. Per radiologist's interpretation.    CT pending.  Patient's blood pressure is still soft with maps less than 65.  He was given Tylenol  suppository for fever and he was given fluids however did not significantly improve.  I have started him on Levophed .  Will need to be admitted to critical care for further workup and intervention.  Discussed with patient's wife on the phone and she is agreeable.  She reconfirmed the patient is a DNR/DNI.  There was no MOST form available at bedside.  Patient will need admission to critical care for further workup and intervention.  Discussed this with patient's wife with the phone and is agreeable.  I discussed this case with my attending physician who cosigned this note including patient's presenting symptoms, physical exam, and planned diagnostics and interventions. Attending physician stated agreement with plan or made changes to plan  which were implemented.   Portions of this report may have been transcribed using  voice recognition software. Every effort was made to ensure accuracy; however, inadvertent computerized transcription errors may be present.    Final diagnoses:  Septic shock [facility])    ED Discharge Orders     None          Bernis Ernst, [place] 10/14/24 1634    Rogelia Jerilynn RAMAN, MD 10/14/24 1704

## 2024-10-13 NOTE — Sepsis Progress Note (Signed)
 eLink is following this Code Sepsis.

## 2024-10-13 NOTE — Sepsis Progress Note (Addendum)
 1030 Contacted Alyssa RN, who stated that one set of blood cultures were drawn prior to giving the antibiotic.  1040 Notified provider of need to order another lactic acid and fluid bolus.

## 2024-10-13 NOTE — Progress Notes (Signed)
 PHARMACY - PHYSICIAN COMMUNICATION CRITICAL VALUE ALERT - BLOOD CULTURE IDENTIFICATION (BCID)  Joshua Haley is an 73 y.o. male who presented to [facility] on 10/13/2024 with a chief complaint of sepsis  Assessment:  2 our of 4 BC + ESBL Ecoli  Name of physician (or Provider) Contacted: Ogan  Current antibiotics: Merrem   Changes to prescribed antibiotics recommended:  Patient is on recommended antibiotics - No changes needed  Results for orders placed or performed during the hospital encounter of 10/13/24  Blood Culture ID Panel (Reflexed) (Collected: 10/13/2024  8:01 AM)  Result Value Ref Range   Enterococcus faecalis NOT DETECTED NOT DETECTED   Enterococcus Faecium NOT DETECTED NOT DETECTED   Listeria monocytogenes NOT DETECTED NOT DETECTED   Staphylococcus species NOT DETECTED NOT DETECTED   Staphylococcus aureus (BCID) NOT DETECTED NOT DETECTED   Staphylococcus epidermidis NOT DETECTED NOT DETECTED   Staphylococcus lugdunensis NOT DETECTED NOT DETECTED   Streptococcus species NOT DETECTED NOT DETECTED   Streptococcus agalactiae NOT DETECTED NOT DETECTED   Streptococcus pneumoniae NOT DETECTED NOT DETECTED   Streptococcus pyogenes NOT DETECTED NOT DETECTED   A.calcoaceticus-baumannii NOT DETECTED NOT DETECTED   Bacteroides fragilis NOT DETECTED NOT DETECTED   Enterobacterales DETECTED (A) NOT DETECTED   Enterobacter cloacae complex NOT DETECTED NOT DETECTED   Escherichia coli DETECTED (A) NOT DETECTED   Klebsiella aerogenes NOT DETECTED NOT DETECTED   Klebsiella oxytoca NOT DETECTED NOT DETECTED   Klebsiella pneumoniae NOT DETECTED NOT DETECTED   Proteus species NOT DETECTED NOT DETECTED   Salmonella species NOT DETECTED NOT DETECTED   Serratia marcescens NOT DETECTED NOT DETECTED   Haemophilus influenzae NOT DETECTED NOT DETECTED   Neisseria meningitidis NOT DETECTED NOT DETECTED   Pseudomonas aeruginosa NOT DETECTED NOT DETECTED   Stenotrophomonas maltophilia NOT  DETECTED NOT DETECTED   Candida albicans NOT DETECTED NOT DETECTED   Candida auris NOT DETECTED NOT DETECTED   Candida glabrata NOT DETECTED NOT DETECTED   Candida krusei NOT DETECTED NOT DETECTED   Candida parapsilosis NOT DETECTED NOT DETECTED   Candida tropicalis NOT DETECTED NOT DETECTED   Cryptococcus neoformans/gattii NOT DETECTED NOT DETECTED   CTX-M ESBL DETECTED (A) NOT DETECTED   Carbapenem resistance IMP NOT DETECTED NOT DETECTED   Carbapenem resistance KPC NOT DETECTED NOT DETECTED   Carbapenem resistance NDM NOT DETECTED NOT DETECTED   Carbapenem resist OXA 48 LIKE NOT DETECTED NOT DETECTED   Carbapenem resistance VIM NOT DETECTED NOT DETECTED   Temperence Zenor Karoline Marina, PharmD, BCPS Clinical Staff Pharmacist  Marina Salines Stillinger 10/13/2024  8:31 PM

## 2024-10-13 NOTE — Consult Note (Addendum)
 WOC Nurse Consult Note: Reason for Consult: Requested to assess a sacrum wound. Performed remote evaluation of photos and notes. Meigs with the team for further investigation. Thank you for all your help.  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 Pressure Injury POA: Yes  PI stage 3. Measurement: aprox. 2 cm x 1.5 cm x 0.5 cm Wound bed: 100% dark red, non granulate tissue. Drainage (amount, consistency, odor) See flowsheet. Periwound: DTI and friction bilateral buttocks. DTI bilateral buttock and friction associated. Measurement: total aprox. 6 cm x 11 cm x 0.1 cm. Multiple partial thickness 100% red in the wound bed. Purple skin tissue peri-wounds. Dressing procedure/placement/frequency: Sacrum and buttock: Cleanse with Vashe #[id number], not rinse. Apply Aquacel [id number] into the wound bed on sacrum, cover the entire area with sacrum foam dressing, including the DTPI buttocks. Change daily or PRN soiling. If the foam is not saturated or soiling, it can remain on for up to 3 days, Ok to lift and reapply the Aquacel.  MASD on L thigh. Cleanse with warm water  and wash cloth. Apply Gerhardt butt cream twice a day or PRN.  Left hip. Measurement: aprox. 9 cm x 5 cm x 1 cm. Open wound with 100% yellow slough, probably with bone access. Wound bed: 100% yellow. Peri-wound: DTPI purple skin, erythema. Drainage (amount, consistency, odor) See flowsheet. Dressing procedure/placement/frequency: Cleanse with Vashe [id number], nor rinse, pat dry the peri-wound skin. Pack and cover the wound bed with Aquacel #[id number], top with foam dressing. Change daily or PRN.  Bed nurse recommendations: - Turn and repositioning per hospital policy - If the pt is able to go in the chair, add a chair pressure retribution pad.   WOC team will not plan to follow further. Please reconsult if further assistance is  needed. Thank-you,  Lela Holm RN, CNS, [employer], MSN.  (Phone [phone])

## 2024-10-13 NOTE — ED Notes (Signed)
 Indwelling foley catheter removed per MD order and replaced with 75fr coude catheter. Upon removing initial catheter, bright red blood persistently trickled from the tip of patient's penis with clots. Upon inserting new catheter, bloody urine flowed then turned to a cloudy yellow.

## 2024-10-14 DIAGNOSIS — L8990 Pressure ulcer of unspecified site, unspecified stage: Secondary | ICD-10-CM | POA: Diagnosis present

## 2024-10-14 LAB — GLUCOSE, CAPILLARY
Glucose-Capillary: 310 mg/dL — ABNORMAL HIGH (ref 70–99)
Glucose-Capillary: 343 mg/dL — ABNORMAL HIGH (ref 70–99)
Glucose-Capillary: 325 mg/dL — ABNORMAL HIGH (ref 70–99)
Glucose-Capillary: 304 mg/dL — ABNORMAL HIGH (ref 70–99)
Glucose-Capillary: 302 mg/dL — ABNORMAL HIGH (ref 70–99)

## 2024-10-14 LAB — BASIC METABOLIC PANEL WITH GFR
Anion gap: 8 (ref 5–15)
BUN: 49 mg/dL — ABNORMAL HIGH (ref 8–23)
CO2: 30 mmol/L (ref 22–32)
Calcium: 8.9 mg/dL (ref 8.9–10.3)
Chloride: 111 mmol/L (ref 98–111)
Creatinine, Ser: 0.91 mg/dL (ref 0.61–1.24)
GFR, Estimated: >60 mL/min (ref >60)
Glucose, Bld: 337 mg/dL — ABNORMAL HIGH (ref 70–99)
Potassium: 3.5 mmol/L (ref 3.5–5.1)
Sodium: 149 mmol/L — ABNORMAL HIGH (ref 135–145)

## 2024-10-14 LAB — CBC
HCT: 31.6 % — ABNORMAL LOW (ref 39.0–52.0)
Hemoglobin: 8.9 g/dL — ABNORMAL LOW (ref 13.0–17.0)
MCH: 25.6 pg — ABNORMAL LOW (ref 26.0–34.0)
MCHC: 28.2 g/dL — ABNORMAL LOW (ref 30.0–36.0)
MCV: 90.8 fL (ref 80.0–100.0)
Platelets: 365 K/uL (ref 150–400)
RBC: 3.48 MIL/uL — ABNORMAL LOW (ref 4.22–5.81)
RDW: 16.9 % — ABNORMAL HIGH (ref 11.5–15.5)
WBC: 11 K/uL — ABNORMAL HIGH (ref 4.0–10.5)
nRBC: 0 % (ref 0.0–0.2)

## 2024-10-14 LAB — PHOSPHORUS: Phosphorus: 2 mg/dL — ABNORMAL LOW (ref 2.5–4.6)

## 2024-10-14 LAB — MAGNESIUM: Magnesium: 2.2 mg/dL (ref 1.7–2.4)

## 2024-10-14 MED ORDER — MIDODRINE HCL 5 MG PO TABS
10.0000 mg | ORAL_TABLET | Freq: Three times a day (TID) | ORAL | Status: DC
  Administered 2024-10-14 (×2): 10 mg via ORAL
  Filled 2024-10-14 (×2): qty 2

## 2024-10-14 MED ORDER — CHLORHEXIDINE GLUCONATE CLOTH 2 % EX PADS
6.0000 | MEDICATED_PAD | Freq: Every day | CUTANEOUS | Status: DC
  Administered 2024-10-15 – 2024-10-16 (×2): 6 via TOPICAL

## 2024-10-14 MED ORDER — POTASSIUM & SODIUM PHOSPHATES 280-160-250 MG PO PACK
2.0000 | PACK | ORAL | Status: AC
  Administered 2024-10-14 (×3): 2
  Filled 2024-10-14 (×3): qty 2

## 2024-10-14 MED ORDER — INSULIN GLARGINE-YFGN 100 UNIT/ML ~~LOC~~ SOLN
10.0000 [IU] | Freq: Every day | SUBCUTANEOUS | Status: DC
  Administered 2024-10-14 – 2024-10-15 (×2): 10 [IU] via SUBCUTANEOUS
  Filled 2024-10-14 (×2): qty 0.1

## 2024-10-14 MED ORDER — MIDODRINE HCL 5 MG PO TABS
10.0000 mg | ORAL_TABLET | Freq: Three times a day (TID) | ORAL | Status: DC
  Administered 2024-10-15 – 2024-10-17 (×9): 10 mg
  Filled 2024-10-14 (×9): qty 2

## 2024-10-14 MED ORDER — POTASSIUM CHLORIDE 20 MEQ PO PACK
40.0000 meq | PACK | Freq: Once | ORAL | Status: AC
  Administered 2024-10-14: 40 meq
  Filled 2024-10-14: qty 2

## 2024-10-14 MED ORDER — FREE WATER
200.0000 mL | Status: DC
  Administered 2024-10-14 – 2024-10-15 (×7): 200 mL

## 2024-10-14 MED ORDER — LACTATED RINGERS IV SOLN: INTRAVENOUS | Status: DC

## 2024-10-14 NOTE — Progress Notes (Signed)
 NAME:  Joshua Haley, MRN:  [id number], DOB:  06/18/1951, LOS: 1 ADMISSION DATE:  10/13/2024, CONSULTATION DATE:  10/13/24 REFERRING MD:  EDP CHIEF COMPLAINT:  Shock, Sepsis   History of Present Illness:  Olden Klauer is a 73 year old male with history of TBI with baseline mental status is awake but not alert or verbal, hypertension, DMII, CAD and chronic tracheostomy who presented to [facility] ER from [facility] with concern for sepsis due to urinary tract source. He was treated with ciprofloxacin starting on 10/13 and a urine culture from [facility] is growing ESBL E. Coli.   In ER, patient remained hypotensive despite fluid bolus resuscitation. He was started on peripheral levophed . Blood cultures were drawn, foley exchanged and urine culture sent and started on meropenem  antibiotic.   Unable to obtain history from patient as he is non-verbal. PCCM called for admission for septic shock  Pertinent  Medical History   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    CAD (coronary artery disease)    Diabetes mellitus without complication (HCC)    Hypertension    Hypothyroidism    Kidney stone    TBI (traumatic brain injury) (HCC)    Tracheostomy in place [facility])    Significant Hospital Events: Including procedures, antibiotic start and stop dates in addition to other pertinent events   10/17 admitted to [facility] hospital for septic shock due to UTI  Interim History / Subjective:   No acute events overnight Updated wife via phone yesterday He remains on levophed   Objective    Blood pressure (!) 110/58, pulse 98, temperature 98 F (36.7 C), temperature source Oral, resp. rate 16, weight 82.2 kg, SpO2 98%.    FiO2 (%):  [28 %] 28 %   Intake/Output Summary (Last 24 hours) at 10/14/2024 0715 Last data filed at 10/14/2024 9352 Gross per 24 hour  Intake 7129.66 ml  Output 1450 ml  Net 5679.66 ml   Filed Weights   10/13/24 1330 10/14/24 0500  Weight:  80.9 kg 82.2 kg    Examination: General: elderly male, chronically ill appearing, diaphoretic HENT: [place]/AT, dry mucous membranes Lungs: clear to auscultation, no wheezing Cardiovascular: tachycardic, no murmurs Abdomen: soft, non-distended, BS+ Extremities: warm, no edema Neuro: PERRL, no alert, does not follow commands GU: foley in place, dark/cloudy urine  Na 149 K 3.5 Glucose 337 Phos 2.0 Lactic Acid 3.9 to 5.1 to 3.4 to 1.6 WBC 13.7 to 11  Resolved problem list   Assessment and Plan   Septic Shock due to ESBL E. Coli UTI - chronic foley exchanged - follow up blood and urine culture results - UTI present on admission based on culture from [facility] 10/16 - continue meropenem  - lactic acidosis has resolved - MAP goal 65 or greater - Continue vasopressor support - continue home midodrine   Hypernatremia hypophosphatemia - free water  flushes - phos repletion  Sacral Decubitus Ulcer Left Hip Wound, Deep - present on admission, pictures in media tab - wound care consult  Chronic trach - present for airway protection - continue red cap  TBI - chronic  - continue modafinil  and sertraline   Hypothyroidism - continue home meds, synthroid  and liothyronine   DMII - SSI q 4hrs - glargine 10 units daily  Nutrition - Tube Feeds per RD  Labs   CBC: Recent Labs  Lab 10/13/24 0705 10/14/24 0255  WBC 13.7* 11.0*  NEUTROABS 12.7*  --   HGB 10.3* 8.9*  HCT 36.7* 31.6*  MCV 90.8 90.8  PLT 402* 365    Basic Metabolic Panel: Recent Labs  Lab 10/13/24 0705 10/13/24 1500 10/13/24 1716 10/14/24 0255  NA 150* 147*  --  149*  K 3.3* 4.5  --  3.5  CL 107 107  --  111  CO2 28 26  --  30  GLUCOSE 233* 333*  --  337*  BUN 63* 62*  --  49*  CREATININE 1.19 1.19  --  0.91  CALCIUM 9.4 8.9  --  8.9  MG  --   --  2.2 2.2  PHOS  --   --  2.4* 2.0*   GFR: CrCl cannot be calculated (Unknown ideal weight.). Recent Labs  Lab 10/13/24 0705 10/13/24 0815  10/13/24 1021 10/13/24 1217 10/13/24 1500 10/14/24 0255  WBC 13.7*  --   --   --   --  11.0*  LATICACIDVEN  --  3.9* 5.1* 3.4* 1.6  --     Liver Function Tests: Recent Labs  Lab 10/13/24 0705  AST 21  ALT 14  ALKPHOS 182*  BILITOT 0.5  PROT 7.4  ALBUMIN 2.9*   No results for input(s): LIPASE, AMYLASE in the last 168 hours. No results for input(s): AMMONIA in the last 168 hours.  ABG    Component Value Date/Time   PHART 7.455 (H) 01/21/2023 0956   PCO2ART 37.9 01/21/2023 0956   PO2ART 87 01/21/2023 0956   HCO3 28.8 (H) 10/13/2024 1007   TCO2 28 01/21/2023 0956   O2SAT 54.2 10/13/2024 1007     Coagulation Profile: Recent Labs  Lab 10/13/24 0705 10/13/24 0930  INR 1.3* 1.3*    Cardiac Enzymes: No results for input(s): CKTOTAL, CKMB, CKMBINDEX, TROPONINI in the last 168 hours.  HbA1C: Hgb A1c MFr Bld  Date/Time Value Ref Range Status  10/13/2024 02:58 PM 7.7 (H) 4.8 - 5.6 % Final    Comment:    (NOTE) Diagnosis of Diabetes The following HbA1c ranges recommended by the American Diabetes Association (ADA) may be used as an aid in the diagnosis of diabetes mellitus.  Hemoglobin             Suggested A1C NGSP%              Diagnosis  <5.7                   Non Diabetic  5.7-6.4                Pre-Diabetic  >6.4                   Diabetic  <7.0                   Glycemic control for                       adults with diabetes.    12/29/2022 12:07 AM 9.2 (H) 4.8 - 5.6 % Final    Comment:    (NOTE)         Prediabetes: 5.7 - 6.4         Diabetes: >6.4         Glycemic control for adults with diabetes: <7.0     CBG: Recent Labs  Lab 10/13/24 0710 10/13/24 1637 10/13/24 1956 10/13/24 2321 10/14/24 0412  GLUCAP 223* 329* 341* 280* 310*    Critical care time: 40 minutes    CRITICAL CARE Performed by: Dorn KATHEE Chill  Total critical care time: 35 minutes  Critical care time was exclusive of separately billable  procedures and treating other patients.  Critical care was necessary to treat or prevent imminent or life-threatening deterioration.  Critical care was time spent personally by me on the following activities: development of treatment plan with patient and/or surrogate as well as nursing, discussions with consultants, evaluation of patient's response to treatment, examination of patient, obtaining history from patient or surrogate, ordering and performing treatments and interventions, ordering and review of laboratory studies, ordering and review of radiographic studies, pulse oximetry, re-evaluation of patient's condition and participation in multidisciplinary rounds.  Dorn Chill, MD [facility] Office: [phone]   See Amion for personal pager PCCM on call pager [phone] until 7pm. Please call Elink 7p-7a. [phone]

## 2024-10-14 NOTE — ED Provider Notes (Signed)
.  Critical Care  Performed by: Rogelia Jerilynn RAMAN, MD Authorized by: Rogelia Jerilynn RAMAN, MD   Critical care provider statement:    Critical care time (minutes):  31   Critical care was necessary to treat or prevent imminent or life-threatening deterioration of the following conditions:  Shock, sepsis and circulatory failure   Critical care was time spent personally by me on the following activities:  Development of treatment plan with patient or surrogate, discussions with consultants, evaluation of patient's response to treatment, examination of patient, ordering and review of laboratory studies, ordering and review of radiographic studies, ordering and performing treatments and interventions, pulse oximetry, re-evaluation of patient's condition and review of old charts   Care discussed with: admitting provider       Rogelia Jerilynn RAMAN, MD 10/14/24 1705

## 2024-10-14 NOTE — Progress Notes (Signed)
 [facility] ADULT ICU REPLACEMENT PROTOCOL   The patient does apply for the [facility] Adult ICU Electrolyte Replacment Protocol based on the criteria listed below:   1.Exclusion criteria: TCTS, ECMO, Dialysis, and Myasthenia Gravis patients 2. Is GFR >/= 30 ml/min? Yes.    Patient's GFR today is >60 3. Is SCr </= 2? Yes.   Patient's SCr is 0.91 mg/dL 4. Did SCr increase >/= 0.5 in 24 hours? No. 5.Pt's weight >40kg  Yes.   6. Abnormal electrolyte(s): K+ = 3.5, Phos = 2.0  7. Electrolytes replaced per protocol 8.  Call MD STAT for K+ </= 2.5, Phos </= 1, or Mag </= 1 Physician:  Epimenio, eMD   [name] Robertta Halfhill 10/14/2024 5:43 AM

## 2024-10-15 ENCOUNTER — Other Ambulatory Visit: Payer: Self-pay

## 2024-10-15 ENCOUNTER — Encounter (HOSPITAL_COMMUNITY): Payer: Self-pay | Admitting: Pulmonary Disease

## 2024-10-15 LAB — BASIC METABOLIC PANEL WITH GFR
Anion gap: 11 (ref 5–15)
BUN: 36 mg/dL — ABNORMAL HIGH (ref 8–23)
CO2: 25 mmol/L (ref 22–32)
Calcium: 8.7 mg/dL — ABNORMAL LOW (ref 8.9–10.3)
Chloride: 113 mmol/L — ABNORMAL HIGH (ref 98–111)
Creatinine, Ser: 0.78 mg/dL (ref 0.61–1.24)
GFR, Estimated: >60 mL/min (ref >60)
Glucose, Bld: 326 mg/dL — ABNORMAL HIGH (ref 70–99)
Potassium: 4.1 mmol/L (ref 3.5–5.1)
Sodium: 149 mmol/L — ABNORMAL HIGH (ref 135–145)

## 2024-10-15 LAB — GLUCOSE, CAPILLARY
Glucose-Capillary: 210 mg/dL — ABNORMAL HIGH (ref 70–99)
Glucose-Capillary: 219 mg/dL — ABNORMAL HIGH (ref 70–99)
Glucose-Capillary: 269 mg/dL — ABNORMAL HIGH (ref 70–99)
Glucose-Capillary: 298 mg/dL — ABNORMAL HIGH (ref 70–99)
Glucose-Capillary: 299 mg/dL — ABNORMAL HIGH (ref 70–99)
Glucose-Capillary: 299 mg/dL — ABNORMAL HIGH (ref 70–99)
Glucose-Capillary: 308 mg/dL — ABNORMAL HIGH (ref 70–99)

## 2024-10-15 LAB — PHOSPHORUS: Phosphorus: 2 mg/dL — ABNORMAL LOW (ref 2.5–4.6)

## 2024-10-15 LAB — CULTURE, BLOOD (ROUTINE X 2): Special Requests: ADEQUATE

## 2024-10-15 LAB — URINE CULTURE: Culture: 60000 — AB

## 2024-10-15 LAB — MAGNESIUM: Magnesium: 2.3 mg/dL (ref 1.7–2.4)

## 2024-10-15 MED ORDER — INSULIN GLARGINE-YFGN 100 UNIT/ML ~~LOC~~ SOLN
10.0000 [IU] | Freq: Once | SUBCUTANEOUS | Status: AC
  Administered 2024-10-15: 10 [IU] via SUBCUTANEOUS
  Filled 2024-10-15: qty 0.1

## 2024-10-15 MED ORDER — INSULIN GLARGINE-YFGN 100 UNIT/ML ~~LOC~~ SOLN
20.0000 [IU] | Freq: Every day | SUBCUTANEOUS | Status: DC
  Administered 2024-10-16: 20 [IU] via SUBCUTANEOUS
  Filled 2024-10-15 (×2): qty 0.2

## 2024-10-15 MED ORDER — ORAL CARE MOUTH RINSE
15.0000 mL | OROMUCOSAL | Status: DC | PRN
  Administered 2024-10-15 – 2024-10-16 (×2): 15 mL via OROMUCOSAL

## 2024-10-15 MED ORDER — FREE WATER
300.0000 mL | Status: DC
  Administered 2024-10-15 – 2024-10-17 (×14): 300 mL

## 2024-10-15 MED ORDER — ORAL CARE MOUTH RINSE
15.0000 mL | OROMUCOSAL | Status: DC
  Administered 2024-10-15 – 2024-10-17 (×11): 15 mL via OROMUCOSAL

## 2024-10-15 MED ORDER — POLYVINYL ALCOHOL 1.4 % OP SOLN
2.0000 [drp] | OPHTHALMIC | Status: DC | PRN
  Administered 2024-10-15 – 2024-10-16 (×2): 2 [drp] via OPHTHALMIC
  Filled 2024-10-15: qty 15

## 2024-10-15 NOTE — Plan of Care (Signed)

## 2024-10-15 NOTE — Progress Notes (Signed)
 eLink Physician-Brief Progress Note Patient Name: Joshua Haley DOB: [birth year] MRN: [id number]   Date of Service  10/15/2024  HPI/Events of Note  Per BSRN :crusty eyes, little suspicious for some conjunctivitis - not sure if they would want to start antibiotic drops before the MD can look at it, but perhaps even some lubricating drops to see if it clears?  eICU Interventions  Camera : he is resting.  Natural tears for now, bed side MD to evaluate in AM,.      Intervention Category Intermediate Interventions: Other:  Jodelle ONEIDA Hutching 10/15/2024, 8:47 PM

## 2024-10-15 NOTE — Progress Notes (Signed)
 NAME:  Joshua Haley, MRN:  [id number], DOB:  06-29-1951, LOS: 2 ADMISSION DATE:  10/13/2024, CONSULTATION DATE:  10/13/24 REFERRING MD:  EDP CHIEF COMPLAINT:  Shock, Sepsis   History of Present Illness:  Joshua Haley is a 73 year old male with history of TBI with baseline mental status is awake but not alert or verbal, hypertension, DMII, CAD and chronic tracheostomy who presented to [facility] ER from [facility] with concern for sepsis due to urinary tract source. He was treated with ciprofloxacin starting on 10/13 and a urine culture from [facility] is growing ESBL E. Coli.   In ER, patient remained hypotensive despite fluid bolus resuscitation. He was started on peripheral levophed . Blood cultures were drawn, foley exchanged and urine culture sent and started on meropenem  antibiotic.   Unable to obtain history from patient as he is non-verbal. PCCM called for admission for septic shock  Pertinent  Medical History   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    CAD (coronary artery disease)    Diabetes mellitus without complication (HCC)    Hypertension    Hypothyroidism    Kidney stone    TBI (traumatic brain injury) (HCC)    Tracheostomy in place [facility])    Significant Hospital Events: Including procedures, antibiotic start and stop dates in addition to other pertinent events   10/17 admitted to [facility] hospital for septic shock due to UTI  Interim History / Subjective:   No acute events overnight Updated wife via phone today Weaned off levophed    Objective    Blood pressure (!) 121/52, pulse 96, temperature 98.4 F (36.9 C), temperature source Axillary, resp. rate 19, height 6' (1.829 m), weight 82.9 kg, SpO2 98%.        Intake/Output Summary (Last 24 hours) at 10/15/2024 0800 Last data filed at 10/15/2024 0705 Gross per 24 hour  Intake 2613.05 ml  Output 2675 ml  Net -61.95 ml   Filed Weights   10/13/24 1330 10/14/24 0500 10/15/24 0500   Weight: 80.9 kg 82.2 kg 82.9 kg   Examination: General: elderly male, chronically ill appearing, diaphoretic HENT: [place]/AT, dry mucous membranes Lungs: clear to auscultation, no wheezing Cardiovascular: tachycardic, no murmurs Abdomen: soft, non-distended, BS+ Extremities: warm, no edema Neuro: PERRL, no alert, does not follow commands GU: foley in place, dark/cloudy urine  Resolved problem list   Assessment and Plan   Septic Shock due to ESBL E. Coli UTI - chronic foley exchanged - follow up blood and urine culture results - UTI present on admission based on culture from [facility] 10/16 - continue meropenem  - continue home midodrine   Hypernatremia hypophosphatemia - free water  flushes - phos repletion  Sacral Decubitus Ulcer Left Hip Wound, Deep - present on admission, pictures in media tab - wound care consult  Chronic trach - present for airway protection - continue red cap  TBI - chronic  - continue modafinil  and sertraline   Hypothyroidism - continue home meds, synthroid  and liothyronine   DMII - SSI q 4hrs - glargine 20 units daily  Nutrition - Tube Feeds per RD  Labs   CBC: Recent Labs  Lab 10/13/24 0705 10/14/24 0255  WBC 13.7* 11.0*  NEUTROABS 12.7*  --   HGB 10.3* 8.9*  HCT 36.7* 31.6*  MCV 90.8 90.8  PLT 402* 365    Basic Metabolic Panel: Recent Labs  Lab 10/13/24 0705 10/13/24 1500 10/13/24 1716 10/14/24 0255  NA 150* 147*  --  149*  K  3.3* 4.5  --  3.5  CL 107 107  --  111  CO2 28 26  --  30  GLUCOSE 233* 333*  --  337*  BUN 63* 62*  --  49*  CREATININE 1.19 1.19  --  0.91  CALCIUM 9.4 8.9  --  8.9  MG  --   --  2.2 2.2  PHOS  --   --  2.4* 2.0*   GFR: Estimated Creatinine Clearance: 80.5 mL/min (by C-G formula based on SCr of 0.91 mg/dL). Recent Labs  Lab 10/13/24 0705 10/13/24 0815 10/13/24 1021 10/13/24 1217 10/13/24 1500 10/14/24 0255  WBC 13.7*  --   --   --   --  11.0*  LATICACIDVEN  --  3.9* 5.1* 3.4* 1.6   --     Liver Function Tests: Recent Labs  Lab 10/13/24 0705  AST 21  ALT 14  ALKPHOS 182*  BILITOT 0.5  PROT 7.4  ALBUMIN 2.9*   No results for input(s): LIPASE, AMYLASE in the last 168 hours. No results for input(s): AMMONIA in the last 168 hours.  ABG    Component Value Date/Time   PHART 7.455 (H) 01/21/2023 0956   PCO2ART 37.9 01/21/2023 0956   PO2ART 87 01/21/2023 0956   HCO3 28.8 (H) 10/13/2024 1007   TCO2 28 01/21/2023 0956   O2SAT 54.2 10/13/2024 1007     Coagulation Profile: Recent Labs  Lab 10/13/24 0705 10/13/24 0930  INR 1.3* 1.3*    Cardiac Enzymes: No results for input(s): CKTOTAL, CKMB, CKMBINDEX, TROPONINI in the last 168 hours.  HbA1C: Hgb A1c MFr Bld  Date/Time Value Ref Range Status  10/13/2024 02:58 PM 7.7 (H) 4.8 - 5.6 % Final    Comment:    (NOTE) Diagnosis of Diabetes The following HbA1c ranges recommended by the American Diabetes Association (ADA) may be used as an aid in the diagnosis of diabetes mellitus.  Hemoglobin             Suggested A1C NGSP%              Diagnosis  <5.7                   Non Diabetic  5.7-6.4                Pre-Diabetic  >6.4                   Diabetic  <7.0                   Glycemic control for                       adults with diabetes.    12/29/2022 12:07 AM 9.2 (H) 4.8 - 5.6 % Final    Comment:    (NOTE)         Prediabetes: 5.7 - 6.4         Diabetes: >6.4         Glycemic control for adults with diabetes: <7.0     CBG: Recent Labs  Lab 10/14/24 1204 10/14/24 1630 10/14/24 2044 10/15/24 0003 10/15/24 0346  GLUCAP 325* 304* 302* 308* 298*    Critical care time: n/a    Dorn Chill, MD [facility] Office: [phone]   See Amion for personal pager PCCM on call pager [phone] until 7pm. Please call Elink 7p-7a. [phone]

## 2024-10-16 DIAGNOSIS — E440 Moderate protein-calorie malnutrition: Secondary | ICD-10-CM

## 2024-10-16 DIAGNOSIS — R6521 Severe sepsis with septic shock: Secondary | ICD-10-CM

## 2024-10-16 DIAGNOSIS — A4151 Sepsis due to Escherichia coli [E. coli]: Secondary | ICD-10-CM

## 2024-10-16 LAB — CBC WITH DIFFERENTIAL/PLATELET
Abs Immature Granulocytes: 0.02 K/uL (ref 0.00–0.07)
Basophils Absolute: 0 K/uL (ref 0.0–0.1)
Basophils Relative: 1 %
Eosinophils Absolute: 0.2 K/uL (ref 0.0–0.5)
Eosinophils Relative: 6 %
HCT: 34.3 % — ABNORMAL LOW (ref 39.0–52.0)
Hemoglobin: 9.9 g/dL — ABNORMAL LOW (ref 13.0–17.0)
Immature Granulocytes: 1 %
Lymphocytes Relative: 24 %
Lymphs Abs: 0.9 K/uL (ref 0.7–4.0)
MCH: 26.3 pg (ref 26.0–34.0)
MCHC: 28.9 g/dL — ABNORMAL LOW (ref 30.0–36.0)
MCV: 91.2 fL (ref 80.0–100.0)
Monocytes Absolute: 0.3 K/uL (ref 0.1–1.0)
Monocytes Relative: 8 %
Neutro Abs: 2.4 K/uL (ref 1.7–7.7)
Neutrophils Relative %: 60 %
Platelets: 299 K/uL (ref 150–400)
RBC: 3.76 MIL/uL — ABNORMAL LOW (ref 4.22–5.81)
RDW: 16.7 % — ABNORMAL HIGH (ref 11.5–15.5)
WBC: 3.9 K/uL — ABNORMAL LOW (ref 4.0–10.5)
nRBC: 0 % (ref 0.0–0.2)

## 2024-10-16 LAB — GLUCOSE, CAPILLARY
Glucose-Capillary: 229 mg/dL — ABNORMAL HIGH (ref 70–99)
Glucose-Capillary: 173 mg/dL — ABNORMAL HIGH (ref 70–99)
Glucose-Capillary: 173 mg/dL — ABNORMAL HIGH (ref 70–99)
Glucose-Capillary: 231 mg/dL — ABNORMAL HIGH (ref 70–99)
Glucose-Capillary: 330 mg/dL — ABNORMAL HIGH (ref 70–99)
Glucose-Capillary: 252 mg/dL — ABNORMAL HIGH (ref 70–99)

## 2024-10-16 LAB — COMPREHENSIVE METABOLIC PANEL WITH GFR
ALT: 15 U/L (ref 0–44)
AST: 30 U/L (ref 15–41)
Albumin: 2.6 g/dL — ABNORMAL LOW (ref 3.5–5.0)
Alkaline Phosphatase: 115 U/L (ref 38–126)
Anion gap: 11 (ref 5–15)
BUN: 28 mg/dL — ABNORMAL HIGH (ref 8–23)
CO2: 25 mmol/L (ref 22–32)
Calcium: 9.2 mg/dL (ref 8.9–10.3)
Chloride: 115 mmol/L — ABNORMAL HIGH (ref 98–111)
Creatinine, Ser: 0.68 mg/dL (ref 0.61–1.24)
GFR, Estimated: >60 mL/min (ref >60)
Glucose, Bld: 200 mg/dL — ABNORMAL HIGH (ref 70–99)
Potassium: 3.5 mmol/L (ref 3.5–5.1)
Sodium: 150 mmol/L — ABNORMAL HIGH (ref 135–145)
Total Bilirubin: 0.2 mg/dL (ref 0.0–1.2)
Total Protein: 7.1 g/dL (ref 6.5–8.1)

## 2024-10-16 LAB — PHOSPHORUS
Phosphorus: 1.8 mg/dL — ABNORMAL LOW (ref 2.5–4.6)
Phosphorus: 1.8 mg/dL — ABNORMAL LOW (ref 2.5–4.6)
Phosphorus: 2 mg/dL — ABNORMAL LOW (ref 2.5–4.6)

## 2024-10-16 LAB — MAGNESIUM
Magnesium: 2.2 mg/dL (ref 1.7–2.4)
Magnesium: 2.2 mg/dL (ref 1.7–2.4)

## 2024-10-16 MED ORDER — DEXTROSE 5 % IV SOLN: INTRAVENOUS | Status: DC

## 2024-10-16 MED ORDER — GLUCERNA 1.5 CAL PO LIQD
1000.0000 mL | ORAL | Status: DC
  Administered 2024-10-17 (×2): 1000 mL
  Filled 2024-10-16 (×3): qty 1000

## 2024-10-16 MED ORDER — GATIFLOXACIN 0.5 % OP SOLN
1.0000 [drp] | Freq: Four times a day (QID) | OPHTHALMIC | Status: DC
  Administered 2024-10-16 – 2024-10-17 (×4): 1 [drp] via OPHTHALMIC
  Filled 2024-10-16: qty 2.5

## 2024-10-16 MED ORDER — GLUCERNA 1.5 CAL PO LIQD
1000.0000 mL | ORAL | Status: DC
  Filled 2024-10-16: qty 1000

## 2024-10-16 MED ORDER — PIVOT 1.5 CAL PO LIQD
1000.0000 mL | ORAL | Status: AC
  Administered 2024-10-16: 1000 mL
  Filled 2024-10-16: qty 1000

## 2024-10-16 MED ORDER — POTASSIUM PHOSPHATES 15 MMOLE/5ML IV SOLN
30.0000 mmol | Freq: Once | INTRAVENOUS | Status: AC
  Administered 2024-10-16: 30 mmol via INTRAVENOUS
  Filled 2024-10-16: qty 10

## 2024-10-16 NOTE — Hospital Course (Addendum)
 Joshua Haley is a 73 year old male with history of TBI with baseline mental status is awake but not alert or verbal, hypertension, DMII, CAD and chronic tracheostomy who presented to [facility] ER from [facility] with concern for sepsis due to urinary tract source. He was treated with ciprofloxacin starting on 10/13 and a urine culture from [facility] is growing ESBL E. Coli.    In ER, patient remained hypotensive despite fluid bolus resuscitation. He was started on peripheral levophed . Blood cultures were drawn, foley exchanged and urine culture sent and started on meropenem  antibiotic.    Unable to obtain history from patient as he is non-verbal. PCCM called for admission for septic shock  **Interim History  He had to be placed on short-term pressors for his ESBL E. coli UTI and bacteremia and steadily improved.Transferred to the [facility] service on 10/16/2024.  Weaned off of peripheral Levophed  and is being continued on antibiotics with meropenem  and being changed to Bactrim for a total of 10 days.  He is improved and back to his baseline and medically stable to be discharged back to LTAC.  Assessment and Plan:  Septic Shock due to ESBL E. Coli UTI and Bacteremia: Chronic foley exchanged by PCCM. Follow up blood and urine culture results. UTI present on admission based on culture from [facility] 10/16. Off of Pressors now and BP stable. Changing IV Meropenem  to po Bactrim to complete 10 Days. C/w Home Midodrine  10 mg per Tubbe TID -WBC Trend Improved :  Recent Labs  Lab 10/13/24 0705 10/14/24 0255 10/16/24 0906 10/17/24 0332  WBC 13.7* 11.0* 3.9* 4.9  -CTM for S/Sx of Infection. C/w Acetaminophen  650 mg per Tube q6hprn for Fever -Exchange Foley every 3-4 weeks and consider Suprapubic Catheter    Hypophosphatemia: Phos Level improved and went from 2.0 -> 2.8. Replete w/ IV K Phos  30 mmol yesterday. CTM and Trend and repeat Phos Level in the AM   Sacral Decubitus Ulcer / Left Hip Wound, Deep,  poA -Present on admission, pictures in media tab -Wound care consult Wound 10/13/24 1200 Pressure Injury Coccyx Medial;Left;Right Stage 4 - Full thickness tissue loss with exposed bone, tendon or muscle. (Active)     Wound 10/13/24 1336 Pressure Injury Hip Anterior;Left;Proximal Stage 4 - Full thickness tissue loss with exposed bone, tendon or muscle. (Active)     Wound care  Every shift       Comments: Left Hip: Cleanse with Vashe #[id number], nor rinse, pat dry the peri-wound skin. Pack and cover the wound bed with Aquacel #[id number], top with foam dressing. Change daily or PRN.   10/13/24 1150      10/13/24 1147   Wound care  Every shift       Comments: Sacrum and buttocks: Cleanse with Vashe #[id number], not rinse. Apply Aquacel [id number] into the wound bed on sacrum, cover the entire area with sacrum foam dressing, including the DTI buttocks. Change daily or PRN soiling. If the foam is not saturated or soiling, it can remain on for up to 3 days, Ok to lift and reapply the Aquacel.   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 Continue red cap. SpO2: 97 % O2 Flow Rate (L/min): 2 L/min FiO2 (%): 28 %. CTM Respiratory Status carefully    ? Conjunctivitis: C/w Artificial Tears; Order Gatifloxacin 1 Drop Both Eyes 4x Daily  TBI: Chronic. Continue Modafinil  100 mg per Tube Daily and Sertraline  25 mg per Tube Daily, and C/w  Amantadine  100 mg per Tube Daily; Cw Levetiracetam  500 mg per Tube BID; C/w Supportive Care   Hypothyroidism: C/w Levothyroxine  100 mcg per Tube and Liothyronine  7.5 mcg per Tube q12h. Check TSH in the outpatient setting   Hypernatremia: Na+ Trend Improved:  Recent Labs  Lab 10/13/24 0705 10/13/24 1500 10/14/24 0255 10/15/24 0752 10/16/24 0906 10/17/24 0332  NA 150* 147* 149* 149* 150* 141  -Was placed on D5W @ 75 mL/hr x 1 Day and now will stop. Changing 300 mL FWF to 150 mL q4h   DMII: C/w Moderate Novolog  SSI q4h and Semglee  20 units sq Daily. CTM  CBG's per Protocol. HbA1c is 7.7. CBG Trend ranging from 204-248 on the last 4 checks   Nutrition: Tube Feeds per RD and now being transitioned back to Glucerna 1.5 @ 60 mL/hr and once tolerating this TF Regiment will be placed on 1 packet of Juven BID; Was getting 300 mL FWF per Tube q4h but reduce to 150 mL q4h  Hypoalbuminemia: Patient's Albumin Lvl 2.9 -> 2.6 -> 2.5. CTM and Trend and Repeat CMP in the AM  Non-severe (Moderate) Malnutrition in the Context of Chronic illness: Nutrition Status: Nutrition Problem: Moderate Malnutrition Etiology: chronic illness Signs/Symptoms: mild fat depletion, mild muscle depletion Interventions: Refer to RD note for recommendations, Tube feeding

## 2024-10-16 NOTE — Plan of Care (Signed)

## 2024-10-16 NOTE — Progress Notes (Signed)
 Nutrition Follow-up  DOCUMENTATION CODES:   Non-severe (moderate) malnutrition in context of chronic illness  INTERVENTION:  - Transition today to below TF regimen: Glucerna 1.5 at 60 ml/h (1440 ml per day) Provides 2160 kcal, 119 gm protein, 1693 ml free water  daily  - FWF per CCM. Currently ordered 300mL Q4H FWF ( ). In addition to tube feeds, this provides 3450mL/day  - Monitor magnesium , potassium, and phosphorus daily for at least 3 days, MD to replete as needed.  - Once tolerating above TF regimen and labs stable: Add 1 packet Juven BID, each packet provides 95 calories, 2.5 grams of protein (collagen), and 9.8 grams of carbohydrate (3 grams sugar); also contains 7 grams of L-arginine and L-glutamine, 300 mg vitamin C , 15 mg vitamin E, 1.2 mcg vitamin B-12, 9.5 mg zinc , 200 mg calcium, and 1.5 g  Calcium Beta-hydroxy-Beta-methylbutyrate to support wound healing. - TF and Juven together provide: 2350 kcals, 124 grams protein, and 2159mL/day (free water  from formula and water  from Juven, does not include any FWF)  - Monitor weight trends.   NUTRITION DIAGNOSIS:   Moderate Malnutrition related to chronic illness as evidenced by mild fat depletion, mild muscle depletion. *ongoing  GOAL:   Patient will meet greater than or equal to 90% of their needs *met with TF  MONITOR:   Labs, Weight trends, TF tolerance  REASON FOR ASSESSMENT:   New TF, Rounds    ASSESSMENT:   73 year old male with history of TBI with baseline mental status is awake but not alert or verbal, hypertension, DMII, CAD and chronic tracheostomy who presented from [facility] with concern for sepsis due to urinary tract source.  Patient tolerating goal TF's well with no noted issues. Reached goal yesterday afternoon.  Of note, his phosphorus has been consistently low since tube feeds started. Was last replaced 10/18. Reached out to MD to get phosphorus repletion ordered.  Patient weaned off  levophed  early 10/19. Sodium has remained high and appears to be trending up further. Patient currently ordered 300mL Q4H FWF.  Will adjust tube feed regimen to Glucerna 1.5. This has a slightly lower sodium content and slightly higher free water  amount.  Once tolerating above regimen and electrolytes more stable, will add on Juven for wound healing.   Admit weight: 178# Current weight: 182# I&O's; +7.4L since admit + for mild pitting RUE/LUE edema and deep pitting RLE/LLE edema  Medications reviewed and include: 500mg  vitamin C  daily, 300mL Q4H FWF  Labs reviewed:  Na 150  Latest Reference Range & Units 10/13/24 17:16 10/14/24 02:55 10/15/24 07:52 10/16/24 02:32 10/16/24 09:06  Phosphorus 2.5 - 4.6 mg/dL 2.4 (L) 2.0 (L) 2.0 (L) 1.8 (L) 1.8 (L)  (L): Data is abnormally low   Diet Order:   Diet Order             Diet NPO time specified  Diet effective now                   EDUCATION NEEDS:  Not appropriate for education at this time  Skin:  Skin Assessment: Skin Integrity Issues: Skin Integrity Issues:: 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  Estimated Nutritional Needs:  Kcal:  2200-2450 kcals Protein:  115-135 grams Fluid:  >/= 2.2L    Trude Ned RD, LDN Contact via Secure Chat.

## 2024-10-16 NOTE — Progress Notes (Signed)
   10/15/24 2000  HEENT  R Eye (S)  Drainage;Conjunctiva red  L Eye (S)  Drainage;Conjunctiva red   On 2000 assessment noted with crusty yellow drainage on his eyes.  Eyes cleansed and conjunctiva noted to be red bilaterally; Pt non verbal, but squinting his eyes prior to care and after, as if they were itchy.  Elink notified.  Artifical tears were ordered and utilized throughout the shift.  Eyes remain to drain yellow and conjunctiva red.

## 2024-10-16 NOTE — TOC Initial Note (Signed)
 Transition of Care [facility]) - Initial/Assessment Note    Patient Details  Name: Joshua Haley MRN: [id number] Date of Birth: 07-25-1951  Transition [facility] Care [facility]) CM/SW Contact:    Joshua ONEIDA Anon, RN Phone Number: 10/16/2024, 4:39 PM  Clinical Narrative:                 Pt is from [facility] for LTAC. Will need insurance auth to return once medically stable to DC. ICM will continue to follow for DC needs.  Addendum: NCM spoke with Joshua Cairo at [facility] and she states that pt has not been in [place] but in SNF at [facility]. Once pt is medically stable to transfer back to SNF at [facility], she will require DC summary be sent over.    Expected Discharge Plan: Long Term Acute Care (LTAC) Barriers to Discharge: Continued Medical Work up   Patient Goals and CMS Choice Patient states their goals for this hospitalization and ongoing recovery are:: To return to [facility] for [facility] CMS Medicare.gov Compare Post Acute Care list provided to:: Other (Comment Required) (NA) Choice offered to / list presented to : NA [place] ownership interest in [facility].provided to:: Parent NA    Expected Discharge Plan and Services In-house Referral: NA Discharge Planning Services: CM Consult Post Acute Care Choice: Long Term Acute Care (LTAC) Living arrangements for the past 2 months:  [facility])                 DME Arranged: N/A DME Agency: NA       HH Arranged: NA HH Agency: NA        Prior Living Arrangements/Services Living arrangements for the past 2 months:  [facility]) Lives with:: Facility Resident Patient language and need for interpreter reviewed:: Yes Do you feel safe going back to the place where you live?: Yes      Need for Family Participation in Patient Care: Yes (Comment) Care giver support system in place?: Yes (comment)   Criminal Activity/Legal Involvement Pertinent to Current Situation/Hospitalization: No - Comment as  needed  Activities of Daily Living   ADL Screening (condition at time of admission) Independently performs ADLs?: No Does the patient have a NEW difficulty with bathing/dressing/toileting/self-feeding that is expected to last >3 days?: No Does the patient have a NEW difficulty with getting in/out of bed, walking, or climbing stairs that is expected to last >3 days?: No Does the patient have a NEW difficulty with communication that is expected to last >3 days?: No Is the patient deaf or have difficulty hearing?: No Does the patient have difficulty seeing, even when wearing glasses/contacts?: No Does the patient have difficulty concentrating, remembering, or making decisions?: Yes  Permission Sought/Granted Permission sought to share information with : Family Supports    Share Information with NAME: Manvir, Prabhu (Spouse)  [phone]           Emotional Assessment         Alcohol / Substance Use: Not Applicable Psych Involvement: No (comment)  Admission diagnosis:  Septic shock (HCC) [A41.9, R65.21] Sepsis (HCC) [A41.9] Patient Active Problem List   Diagnosis Date Noted   Pressure injury of skin 10/14/2024   Sepsis (HCC) 10/13/2024   Acute respiratory failure with hypoxia (HCC) 01/24/2023   Sepsis due to undetermined organism (HCC) 12/28/2022   Hospital-acquired pneumonia 12/28/2022   Moderate protein-calorie malnutrition 09/12/2022   Urethral bleeding 09/11/2022   Hyperglycemia 09/11/2022   Acute encephalopathy 09/11/2022   Sacral  decubitus ulcer 09/11/2022   CAD (coronary artery disease) 09/11/2022   Septic shock (HCC) 09/10/2022   Gross hematuria    Aspiration into airway    Chronic respiratory failure with hypoxia (HCC)    Acute on chronic respiratory failure with hypoxia (HCC) 06/21/2022   Seizure disorder (HCC) 06/21/2022   Aspiration pneumonia (HCC) 06/21/2022   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 [place] [address] Phone: [phone] Fax: [phone]     Social Drivers of Health (SDOH) Social History: SDOH Screenings   Food Insecurity: Patient Unable To Answer (10/15/2024)  Housing: Patient Unable To Answer (10/15/2024)  Transportation Needs: Patient Unable To Answer (10/15/2024)  Utilities: Patient Unable To Answer (10/15/2024)  Depression (PHQ2-9): Low Risk  (02/22/2020)  Social Connections: Patient Unable To Answer (10/15/2024)  Tobacco Use: Medium Risk (10/15/2024)   SDOH Interventions:     Readmission Risk Interventions    10/16/2024    4:35 PM  Readmission Risk Prevention Plan  Transportation Screening Complete  PCP or Specialist Appt within 5-7 Days Complete  Home Care Screening Complete  Medication Review (RN CM) Complete

## 2024-10-16 NOTE — Progress Notes (Signed)
 PROGRESS NOTE    Joshua Haley  FMW:[id number] DOB: 07-27-1951 DOA: 10/13/2024 PCP: Fleeta Valeria Mayo, MD   Brief Narrative:  Joshua Haley is a 73 year old male with history of TBI with baseline mental status is awake but not alert or verbal, hypertension, DMII, CAD and chronic tracheostomy who presented to [facility] ER from [facility] with concern for sepsis due to urinary tract source. He was treated with ciprofloxacin starting on 10/13 and a urine culture from [facility] is growing ESBL E. Coli.    In ER, patient remained hypotensive despite fluid bolus resuscitation. He was started on peripheral levophed . Blood cultures were drawn, foley exchanged and urine culture sent and started on meropenem  antibiotic.    Unable to obtain history from patient as he is non-verbal. PCCM called for admission for septic shock  **Interim History  He had to be placed on short-term pressors and steadily improved and transferred to the [facility] service on 10/16/2024.  Weaned off of peripheral Levophed  and is being continued on antibiotics with meropenem .  Assessment and Plan:  Septic Shock due to ESBL E. Coli UTI: Chronic foley exchanged by PCCM. Follow up blood and urine culture results. UTI present on admission based on culture from [facility] 10/16. Off of Pressors. C/w IV Meropenem . C/w Home Midodrine  10 mg per Tubbe TID -WBC Trend:  Recent Labs  Lab 10/13/24 0705 10/14/24 0255 10/16/24 0906  WBC 13.7* 11.0* 3.9*  -CTM for S/Sx of Infection. C/w Acetaminophen  650 mg per Tube q6hprn for Fever   Hypophosphatemia: Phos Level Trend:  Recent Labs  Lab 10/13/24 1716 10/14/24 0255 10/15/24 0752 10/16/24 0232 10/16/24 0906 10/16/24 1706  PHOS 2.4* 2.0* 2.0* 1.8* 1.8* 2.0*  -Replete w/ IV K Phos  30 mmol. 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 (Active)     Wound 10/13/24 1336 Pressure Injury Hip Anterior;Left;Proximal Stage 4 - Full thickness tissue loss with exposed bone, tendon or muscle. 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 Continue red cap. SpO2: 98 % O2 Flow Rate (L/min): 2 L/min FiO2 (%): 28 %   ? Conjunctivitis: C/w Artificial Tears; Order Gatifloxacin 1 Drop Both Eyes 4x Daily  TBI: Chronic. Continue Modafinil  100 mg per Tube Daily and Sertraline  25 mg per Tube Daily, and C/w Amantadine  100 mg per Tube Daily; Cw Levetiracetam  500 mg per Tube BID   Hypothyroidism: C/w Levothyroxine  100 mcg per Tube and Liothyronine  7.5 mcg per Tube q12h. Check TSH in the AM  Hypernatremia: Na+ Trend:  Recent Labs  Lab 10/13/24 0705 10/13/24 1500 10/14/24 0255 10/15/24 0752 10/16/24 0906  NA 150* 147* 149* 149* 150*  -Start D5W @ 75 mL/hr x 1 Day and C/w 300 mL FWF   DMII: C/w Moderate Novolog  SSI q4h and Semglee  20 units sq Daily. CTM CBG's per Protocol. CBG Trend:  Recent Labs  Lab 10/15/24 2019 10/15/24 2307 10/16/24 0333 10/16/24 0808 10/16/24 1059 10/16/24 1631 10/16/24 1948  GLUCAP 219* 210* 229* 173* 173* 231* 330*    Nutrition: Tube Feeds per RD and now being transitioned to Glucerna 1.5 @ 60 mL/hr and once tolerating this TF Regiment will be placed on 1 packet of Juven BID; C/w 300 mL FWF per Tube q4h  Hypoalbuminemia: Patient's Albumin Lvl 2.9 -> 2.6. CTM and Trend and Repeat CMP in the AM  Non-severe (Moderate) Malnutrition in the Context of Chronic illness: Nutrition Status: Nutrition Problem: Moderate Malnutrition Etiology: chronic illness Signs/Symptoms: mild fat depletion, mild muscle depletion Interventions: Refer to RD note for recommendations, Tube feeding   DVT prophylaxis: enoxaparin  (LOVENOX ) injection 40 mg Start: 10/13/24 1000 SCDs Start: 10/13/24 0930    Code Status: Limited: Do  not attempt resuscitation (DNR) -DNR-LIMITED -Do Not Intubate/DNI  Family Communication: No family present @ bedside   Disposition Plan:  Level of care: Stepdown Status is: Inpatient Remains inpatient appropriate because: Needs further clinical Improvement and will need to go back to LTAC   Consultants:  PCCM Transfer  Procedures:  As delineated As above  Antimicrobials:  Anti-infectives (From admission, onward)    Start     Dose/Rate Route Frequency Ordered Stop   10/13/24 0700  meropenem  (MERREM ) 1 g in sodium chloride  0.9 % 100 mL IVPB        1 g 200 mL/hr over 30 Minutes Intravenous Every 8 hours 10/13/24 0655         Subjective: Seen and examined at bedside and is nonverbal and resting and in no acute distress.  Heart rate is better as well as blood pressure.  The dietitian is changing his tube feedings around today.  No other concerns or complaints this time.  Objective: Vitals:   10/16/24 1928 10/16/24 2000 10/16/24 2047 10/16/24 2100  BP:  (!) 122/57  120/63  Pulse: 74 73 83 77  Resp: 14 (!) 22 19 16   Temp: 98.8 F (37.1 C)     TempSrc: Axillary     SpO2: 100% 93% 97% 98%  Weight:      Height:        Intake/Output Summary (Last 24 hours) at 10/16/2024 2142 Last data filed at 10/16/2024 2100 Gross per 24 hour  Intake 4594.4 ml  Output 2025 ml  Net 2569.4 ml   Filed Weights   10/14/24 0500 10/15/24 0500 10/16/24 0335  Weight: 82.2 kg 82.9 kg 82.7 kg   Examination: Physical Exam:  Constitutional: Chronically ill-appearing elderly Caucasian male who is in no acute distress and is nonverbal from his TBI Respiratory: Diminished to auscultation bilaterally with some coarse breath sounds, no wheezing, rales, rhonchi or crackles. Normal respiratory effort and patient is not tachypenic. No accessory muscle use.  Wearing 2 L supplemental oxygen via nasal Cardiovascular: RRR, no murmurs / rubs / gallops. S1 and S2 auscultated.  Mild extremity edema Abdomen: Soft,  non-tender, non-distended.  PEG tube is in place.  Bowel sounds positive.  GU: Deferred.  Foley catheter is in place Musculoskeletal: No clubbing / cyanosis of digits/nails. No joint deformity upper and lower extremities.  Skin: Did not turn to view his sacral wounds Neurologic: Does not follow commands and is not verbal Psychiatric: Unable to assess due to his current condition  Data Reviewed: I have personally reviewed following labs and imaging studies  CBC: Recent Labs  Lab 10/13/24 0705 10/14/24 0255 10/16/24 0906  WBC 13.7* 11.0* 3.9*  NEUTROABS 12.7*  --  2.4  HGB 10.3* 8.9* 9.9*  HCT 36.7* 31.6* 34.3*  MCV 90.8 90.8 91.2  PLT 402* 365 299   Basic Metabolic Panel: Recent Labs  Lab 10/13/24 0705 10/13/24 1500 10/13/24 1716 10/13/24 1716 10/14/24 0255 10/15/24 0752 10/16/24 0232 10/16/24 0906 10/16/24 1706  NA 150* 147*  --   --  149* 149*  --  150*  --   K 3.3* 4.5  --   --  3.5 4.1  --  3.5  --   CL 107 107  --   --  111 113*  --  115*  --   CO2 28 26  --   --  30 25  --  25  --   GLUCOSE 233* 333*  --   --  337* 326*  --  200*  --   BUN 63* 62*  --   --  49* 36*  --  28*  --   CREATININE 1.19 1.19  --   --  0.91 0.78  --  0.68  --   CALCIUM 9.4 8.9  --   --  8.9 8.7*  --  9.2  --   MG  --   --  2.2  --  2.2 2.3 2.2 2.2  --   PHOS  --   --  2.4*   < > 2.0* 2.0* 1.8* 1.8* 2.0*   < > = values in this interval not displayed.   GFR: Estimated Creatinine Clearance: 91.6 mL/min (by C-G formula based on SCr of 0.68 mg/dL). Liver Function Tests: Recent Labs  Lab 10/13/24 0705 10/16/24 0906  AST 21 30  ALT 14 15  ALKPHOS 182* 115  BILITOT 0.5 0.2  PROT 7.4 7.1  ALBUMIN 2.9* 2.6*   No results for input(s): LIPASE, AMYLASE in the last 168 hours. No results for input(s): AMMONIA in the last 168 hours. Coagulation Profile: Recent Labs  Lab 10/13/24 0705 10/13/24 0930  INR 1.3* 1.3*   Cardiac Enzymes: No results for input(s): CKTOTAL, CKMB,  CKMBINDEX, TROPONINI in the last 168 hours. BNP (last 3 results) No results for input(s): PROBNP in the last 8760 hours. HbA1C: No results for input(s): HGBA1C in the last 72 hours. CBG: Recent Labs  Lab 10/16/24 0333 10/16/24 0808 10/16/24 1059 10/16/24 1631 10/16/24 1948  GLUCAP 229* 173* 173* 231* 330*   Lipid Profile: No results for input(s): CHOL, HDL, LDLCALC, TRIG, CHOLHDL, LDLDIRECT in the last 72 hours. Thyroid Function Tests: No results for input(s): TSH, T4TOTAL, FREET4, T3FREE, THYROIDAB in the last 72 hours. Anemia Panel: No results for input(s): VITAMINB12, FOLATE, FERRITIN, TIBC, IRON , RETICCTPCT in the last 72 hours. Sepsis Labs: Recent Labs  Lab 10/13/24 0815 10/13/24 1021 10/13/24 1217 10/13/24 1500  LATICACIDVEN 3.9* 5.1* 3.4* 1.6    Recent Results (from the past 240 hours)  Resp panel by RT-PCR (RSV, Flu A&B, Covid)     Status: None   Collection Time: 10/13/24  8:01 AM   Specimen: Nasal Swab  Result Value Ref Range Status   SARS Coronavirus 2 by RT PCR NEGATIVE NEGATIVE Final    Comment: (NOTE) SARS-CoV-2 target nucleic acids are NOT DETECTED.  The SARS-CoV-2 RNA is generally detectable in upper respiratory specimens during the acute phase of infection. The lowest concentration of SARS-CoV-2 viral copies this assay can detect is 138 copies/mL. A negative result does not preclude SARS-Cov-2 infection and should not be used as the sole basis for treatment or other patient management decisions. A negative result may occur with  improper specimen collection/handling, submission of specimen other than nasopharyngeal swab, presence of viral mutation(s) within the areas targeted by this assay, and inadequate number of viral copies(<138 copies/mL). A negative result must be combined with clinical observations, patient history, and epidemiological information. The expected result is Negative.  Fact Sheet for  Patients:  BloggerCourse.com  Fact Sheet for  Healthcare Providers:  SeriousBroker.it  This test is no t yet approved or cleared by the [place]  FDA and  has been authorized for detection and/or diagnosis of SARS-CoV-2 by FDA under an Emergency Use Authorization (EUA). This EUA will remain  in effect (meaning this test can be used) for the duration of the COVID-19 declaration under Section 564(b)(1) of the Act, 21 U.S.C.section 360bbb-3(b)(1), unless the authorization is terminated  or revoked sooner.       Influenza A by PCR NEGATIVE NEGATIVE Final   Influenza B by PCR NEGATIVE NEGATIVE Final    Comment: (NOTE) The Xpert Xpress SARS-CoV-2/FLU/RSV plus assay is intended as an aid in the diagnosis of influenza from Nasopharyngeal swab specimens and should not be used as a sole basis for treatment. Nasal washings and aspirates are unacceptable for Xpert Xpress SARS-CoV-2/FLU/RSV testing.  Fact Sheet for Patients: BloggerCourse.com  Fact Sheet for Healthcare Providers: SeriousBroker.it  This test is not yet approved or cleared by the [place]  FDA and has been authorized for detection and/or diagnosis of SARS-CoV-2 by FDA under an Emergency Use Authorization (EUA). This EUA will remain in effect (meaning this test can be used) for the duration of the COVID-19 declaration under Section 564(b)(1) of the Act, 21 U.S.C. section 360bbb-3(b)(1), unless the authorization is terminated or revoked.     Resp Syncytial Virus by PCR NEGATIVE NEGATIVE Final    Comment: (NOTE) Fact Sheet for Patients: BloggerCourse.com  Fact Sheet for Healthcare Providers: SeriousBroker.it  This test is not yet approved or cleared by the [place]  FDA and has been authorized for detection and/or diagnosis of SARS-CoV-2 by FDA under an Emergency  Use Authorization (EUA). This EUA will remain in effect (meaning this test can be used) for the duration of the COVID-19 declaration under Section 564(b)(1) of the Act, 21 U.S.C. section 360bbb-3(b)(1), unless the authorization is terminated or revoked.  Performed at [facility], [address], [place], [place] [address]   Blood Culture (routine x 2)     Status: Abnormal   Collection Time: 10/13/24  8:01 AM   Specimen: BLOOD  Result Value Ref Range Status   Specimen Description   Final    BLOOD Performed at [facility], [address], [place], [place] [address]    Special Requests   Final    BOTTLES DRAWN AEROBIC AND ANAEROBIC Blood Culture adequate volume Performed at [facility], [address], [place], [place] [address]    Culture  Setup Time   Final    GRAM NEGATIVE RODS IN BOTH AEROBIC AND ANAEROBIC BOTTLES CRITICAL RESULT CALLED TO, READ BACK BY AND VERIFIED WITH: [facility] CRYSTAL ROBERTSON 2034 10/13/24 PR Performed at [facility], [address] [place], [place] [address]    Culture (A)  Final    ESCHERICHIA COLI Confirmed Extended Spectrum Beta-Lactamase Producer (ESBL).  In bloodstream infections from ESBL organisms, carbapenems are preferred over piperacillin/tazobactam. They are shown to have a lower risk of mortality.    Report Status 10/15/2024 FINAL  Final   Organism ID, Bacteria ESCHERICHIA COLI  Final      Susceptibility   Escherichia coli - MIC*    AMPICILLIN >=32 RESISTANT Resistant     CEFAZOLIN (NON-URINE) >=32 RESISTANT Resistant     CEFEPIME  >=32 RESISTANT Resistant     ERTAPENEM <=0.12 SENSITIVE Sensitive     CEFTRIAXONE  >=64 RESISTANT Resistant     CIPROFLOXACIN >=4 RESISTANT Resistant     GENTAMICIN <=  1 SENSITIVE Sensitive     MEROPENEM  <=0.25 SENSITIVE Sensitive     TRIMETH/SULFA <=20 SENSITIVE Sensitive     AMPICILLIN/SULBACTAM >=32 RESISTANT Resistant     PIP/TAZO Value in  next row Sensitive      16 SENSITIVEThis is a modified FDA-approved test that has been validated and its performance characteristics determined by the reporting laboratory.  This laboratory is certified under the Clinical Laboratory Improvement Amendments CLIA as qualified to perform high complexity clinical laboratory testing.    * ESCHERICHIA COLI  Blood Culture (routine x 2)     Status: None (Preliminary result)   Collection Time: 10/13/24  8:01 AM   Specimen: BLOOD LEFT FOREARM  Result Value Ref Range Status   Specimen Description   Final    BLOOD LEFT FOREARM Performed at [facility], [address], [place], [place] [address]    Special Requests   Final    BOTTLES DRAWN AEROBIC AND ANAEROBIC Blood Culture adequate volume Performed at [facility], [address], [place], [place] [address]    Culture   Final    NO GROWTH 3 DAYS Performed at [facility], [address], [place], [place] [address]    Report Status PENDING  Incomplete  Blood Culture ID Panel (Reflexed)     Status: Abnormal   Collection Time: 10/13/24  8:01 AM  Result Value Ref Range Status   Enterococcus faecalis NOT DETECTED NOT DETECTED Final   Enterococcus Faecium NOT DETECTED NOT DETECTED Final   Listeria monocytogenes NOT DETECTED NOT DETECTED Final   Staphylococcus species NOT DETECTED NOT DETECTED Final   Staphylococcus aureus (BCID) NOT DETECTED NOT DETECTED Final   Staphylococcus epidermidis NOT DETECTED NOT DETECTED Final   Staphylococcus lugdunensis NOT DETECTED NOT DETECTED Final   Streptococcus species NOT DETECTED NOT DETECTED Final   Streptococcus agalactiae NOT DETECTED NOT DETECTED Final   Streptococcus pneumoniae NOT DETECTED NOT DETECTED Final   Streptococcus pyogenes NOT DETECTED NOT DETECTED Final   A.calcoaceticus-baumannii NOT DETECTED NOT DETECTED Final   Bacteroides fragilis NOT DETECTED NOT DETECTED Final   Enterobacterales DETECTED (A) NOT DETECTED  Final    Comment: Enterobacterales represent a large order of gram negative bacteria, not a single organism. CRITICAL RESULT CALLED TO, READ BACK BY AND VERIFIED WITH: [facility] CRYSTAL ROBERTSON 2034 10/13/24 PR    Enterobacter cloacae complex NOT DETECTED NOT DETECTED Final   Escherichia coli DETECTED (A) NOT DETECTED Final    Comment: CRITICAL RESULT CALLED TO, READ BACK BY AND VERIFIED WITH: [facility] CRYSTAL ROBERTSON 2034 10/13/24 PR    Klebsiella aerogenes NOT DETECTED NOT DETECTED Final   Klebsiella oxytoca NOT DETECTED NOT DETECTED Final   Klebsiella pneumoniae NOT DETECTED NOT DETECTED Final   Proteus species NOT DETECTED NOT DETECTED Final   Salmonella species NOT DETECTED NOT DETECTED Final   Serratia marcescens NOT DETECTED NOT DETECTED Final   Haemophilus influenzae NOT DETECTED NOT DETECTED Final   Neisseria meningitidis NOT DETECTED NOT DETECTED Final   Pseudomonas aeruginosa NOT DETECTED NOT DETECTED Final   Stenotrophomonas maltophilia NOT DETECTED NOT DETECTED Final   Candida albicans NOT DETECTED NOT DETECTED Final   Candida auris NOT DETECTED NOT DETECTED Final   Candida glabrata NOT DETECTED NOT DETECTED Final   Candida krusei NOT DETECTED NOT DETECTED Final   Candida parapsilosis NOT DETECTED NOT DETECTED Final   Candida tropicalis NOT DETECTED NOT DETECTED Final   Cryptococcus neoformans/gattii NOT DETECTED NOT DETECTED Final   CTX-M  ESBL DETECTED (A) NOT DETECTED Final    Comment: CRITICAL RESULT CALLED TO, READ BACK BY AND VERIFIED WITH: PHARMD CRYSTAL ROBERTSON 2034 10/13/24 PR (NOTE) Extended spectrum beta-lactamase detected. Recommend a carbapenem as initial therapy.      Carbapenem resistance IMP NOT DETECTED NOT DETECTED Final   Carbapenem resistance KPC NOT DETECTED NOT DETECTED Final   Carbapenem resistance NDM NOT DETECTED NOT DETECTED Final   Carbapenem resist OXA 48 LIKE NOT DETECTED NOT DETECTED Final   Carbapenem resistance VIM NOT DETECTED NOT  DETECTED Final    Comment: Performed at [facility], [address], [place], [place] [address]  Remove and replace urinary cath (placed > 5 days) then obtain urine culture from new indwelling urinary catheter.     Status: Abnormal   Collection Time: 10/13/24 10:12 AM   Specimen: Urine, Catheterized  Result Value Ref Range Status   Specimen Description URINE, CATHETERIZED  Final   Special Requests NONE  Final   Culture (A)  Final    60,000 COLONIES/mL ESCHERICHIA COLI Confirmed Extended Spectrum Beta-Lactamase Producer (ESBL).  In bloodstream infections from ESBL organisms, carbapenems are preferred over piperacillin/tazobactam. They are shown to have a lower risk of mortality.    Report Status 10/15/2024 FINAL  Final   Organism ID, Bacteria ESCHERICHIA COLI (A)  Final      Susceptibility   Escherichia coli - MIC*    AMPICILLIN >=32 RESISTANT Resistant     CEFAZOLIN (URINE) Value in next row Resistant      >=32 RESISTANTThis is a modified FDA-approved test that has been validated and its performance characteristics determined by the reporting laboratory.  This laboratory is certified under the Clinical Laboratory Improvement Amendments CLIA as qualified to perform high complexity clinical laboratory testing.    CEFEPIME  Value in next row Resistant      >=32 RESISTANTThis is a modified FDA-approved test that has been validated and its performance characteristics determined by the reporting laboratory.  This laboratory is certified under the Clinical Laboratory Improvement Amendments CLIA as qualified to perform high complexity clinical laboratory testing.    ERTAPENEM Value in next row Sensitive      >=32 RESISTANTThis is a modified FDA-approved test that has been validated and its performance characteristics determined by the reporting laboratory.  This laboratory is certified under the Clinical Laboratory Improvement Amendments CLIA as qualified to perform high complexity clinical  laboratory testing.    CEFTRIAXONE  Value in next row Resistant      >=32 RESISTANTThis is a modified FDA-approved test that has been validated and its performance characteristics determined by the reporting laboratory.  This laboratory is certified under the Clinical Laboratory Improvement Amendments CLIA as qualified to perform high complexity clinical laboratory testing.    CIPROFLOXACIN Value in next row Resistant      >=32 RESISTANTThis is a modified FDA-approved test that has been validated and its performance characteristics determined by the reporting laboratory.  This laboratory is certified under the Clinical Laboratory Improvement Amendments CLIA as qualified to perform high complexity clinical laboratory testing.    GENTAMICIN Value in next row Sensitive      >=32 RESISTANTThis is a modified FDA-approved test that has been validated and its performance characteristics determined by the reporting laboratory.  This laboratory is certified under the Clinical Laboratory Improvement Amendments CLIA as qualified to perform high complexity clinical laboratory testing.    NITROFURANTOIN Value in next row Sensitive      >=32 RESISTANTThis is a modified FDA-approved  test that has been validated and its performance characteristics determined by the reporting laboratory.  This laboratory is certified under the Clinical Laboratory Improvement Amendments CLIA as qualified to perform high complexity clinical laboratory testing.    TRIMETH/SULFA Value in next row Sensitive      >=32 RESISTANTThis is a modified FDA-approved test that has been validated and its performance characteristics determined by the reporting laboratory.  This laboratory is certified under the Clinical Laboratory Improvement Amendments CLIA as qualified to perform high complexity clinical laboratory testing.    AMPICILLIN/SULBACTAM Value in next row Intermediate      >=32 RESISTANTThis is a modified FDA-approved test that has been  validated and its performance characteristics determined by the reporting laboratory.  This laboratory is certified under the Clinical Laboratory Improvement Amendments CLIA as qualified to perform high complexity clinical laboratory testing.    PIP/TAZO Value in next row Sensitive      <=4 SENSITIVEThis is a modified FDA-approved test that has been validated and its performance characteristics determined by the reporting laboratory.  This laboratory is certified under the Clinical Laboratory Improvement Amendments CLIA as qualified to perform high complexity clinical laboratory testing.    MEROPENEM  Value in next row Sensitive      <=4 SENSITIVEThis is a modified FDA-approved test that has been validated and its performance characteristics determined by the reporting laboratory.  This laboratory is certified under the Clinical Laboratory Improvement Amendments CLIA as qualified to perform high complexity clinical laboratory testing.    * 60,000 COLONIES/mL ESCHERICHIA COLI  MRSA Next Gen by PCR, Nasal     Status: Abnormal   Collection Time: 10/13/24 12:17 PM   Specimen: Nasal Mucosa; Nasal Swab  Result Value Ref Range Status   MRSA by PCR Next Gen DETECTED (A) NOT DETECTED Final    Comment: RESULT CALLED TO, READ BACK BY AND VERIFIED WITH:  PULEO, R 10/13/24 1419 AJ (NOTE) The GeneXpert MRSA Assay (FDA approved for NASAL specimens only), is one component of a comprehensive MRSA colonization surveillance program. It is not intended to diagnose MRSA infection nor to guide or monitor treatment for MRSA infections. Test performance is not FDA approved in patients less than 77 years old. Performed at [facility], [address], [place], [place] [address]     Radiology Studies: No results found.  Scheduled Meds:  amantadine   100 mg Per Tube Daily   ascorbic acid   500 mg Per Tube Daily   Chlorhexidine  Gluconate Cloth  6 each Topical QHS   enoxaparin  (LOVENOX ) injection   40 mg Subcutaneous Q24H   free water   300 mL Per Tube Q4H   gatifloxacin  1 drop Both Eyes QID   Gerhardt's butt cream   Topical BID   insulin  aspart  0-15 Units Subcutaneous Q4H   insulin  glargine-yfgn  20 Units Subcutaneous Daily   levETIRAcetam   500 mg Per Tube BID   levothyroxine   100 mcg Per Tube QAC breakfast   liothyronine   7.5 mcg Per Tube Q12H   midodrine   10 mg Per Tube TID WC   modafinil   100 mg Per Tube Daily   mupirocin ointment  1 Application Nasal BID   mouth rinse  15 mL Mouth Rinse 4 times per day   sertraline   25 mg Per Tube Daily   Continuous Infusions:  dextrose  75 mL/hr at 10/16/24 2100   [START ON 10/17/2024] feeding supplement (GLUCERNA 1.5 CAL)     feeding supplement (PIVOT 1.5 CAL) 60 mL/hr at  10/16/24 2100   meropenem  (MERREM ) IV Stopped (10/16/24 1546)   potassium PHOSPHATE  IVPB (in mmol) 85 mL/hr at 10/16/24 2100    LOS: 3 days   Alejandro Marker, DO [facility] Available via Epic secure chat 7am-7pm After these hours, please refer to coverage provider listed on [email or web address] 10/16/2024, 9:42 PM

## 2024-10-17 DIAGNOSIS — A4151 Sepsis due to Escherichia coli [E. coli]: Secondary | ICD-10-CM | POA: Diagnosis not present

## 2024-10-17 DIAGNOSIS — A419 Sepsis, unspecified organism: Secondary | ICD-10-CM | POA: Diagnosis not present

## 2024-10-17 DIAGNOSIS — E440 Moderate protein-calorie malnutrition: Secondary | ICD-10-CM | POA: Diagnosis not present

## 2024-10-17 DIAGNOSIS — R6521 Severe sepsis with septic shock: Secondary | ICD-10-CM | POA: Diagnosis not present

## 2024-10-17 LAB — COMPREHENSIVE METABOLIC PANEL WITH GFR
ALT: 16 U/L (ref 0–44)
AST: 22 U/L (ref 15–41)
Albumin: 2.5 g/dL — ABNORMAL LOW (ref 3.5–5.0)
Alkaline Phosphatase: 132 U/L — ABNORMAL HIGH (ref 38–126)
Anion gap: 9 (ref 5–15)
BUN: 24 mg/dL — ABNORMAL HIGH (ref 8–23)
CO2: 24 mmol/L (ref 22–32)
Calcium: 8.5 mg/dL — ABNORMAL LOW (ref 8.9–10.3)
Chloride: 108 mmol/L (ref 98–111)
Creatinine, Ser: 0.57 mg/dL — ABNORMAL LOW (ref 0.61–1.24)
GFR, Estimated: >60 mL/min (ref >60)
Glucose, Bld: 258 mg/dL — ABNORMAL HIGH (ref 70–99)
Potassium: 3.9 mmol/L (ref 3.5–5.1)
Sodium: 141 mmol/L (ref 135–145)
Total Bilirubin: 0.3 mg/dL (ref 0.0–1.2)
Total Protein: 6 g/dL — ABNORMAL LOW (ref 6.5–8.1)

## 2024-10-17 LAB — CBC WITH DIFFERENTIAL/PLATELET
Abs Immature Granulocytes: 0.02 K/uL (ref 0.00–0.07)
Basophils Absolute: 0 K/uL (ref 0.0–0.1)
Basophils Relative: 1 %
Eosinophils Absolute: 0.3 K/uL (ref 0.0–0.5)
Eosinophils Relative: 7 %
HCT: 31.5 % — ABNORMAL LOW (ref 39.0–52.0)
Hemoglobin: 9.2 g/dL — ABNORMAL LOW (ref 13.0–17.0)
Immature Granulocytes: 0 %
Lymphocytes Relative: 19 %
Lymphs Abs: 0.9 K/uL (ref 0.7–4.0)
MCH: 26.2 pg (ref 26.0–34.0)
MCHC: 29.2 g/dL — ABNORMAL LOW (ref 30.0–36.0)
MCV: 89.7 fL (ref 80.0–100.0)
Monocytes Absolute: 0.4 K/uL (ref 0.1–1.0)
Monocytes Relative: 8 %
Neutro Abs: 3.2 K/uL (ref 1.7–7.7)
Neutrophils Relative %: 65 %
Platelets: 296 K/uL (ref 150–400)
RBC: 3.51 MIL/uL — ABNORMAL LOW (ref 4.22–5.81)
RDW: 16.6 % — ABNORMAL HIGH (ref 11.5–15.5)
WBC: 4.9 K/uL (ref 4.0–10.5)
nRBC: 0 % (ref 0.0–0.2)

## 2024-10-17 LAB — GLUCOSE, CAPILLARY
Glucose-Capillary: 248 mg/dL — ABNORMAL HIGH (ref 70–99)
Glucose-Capillary: 222 mg/dL — ABNORMAL HIGH (ref 70–99)
Glucose-Capillary: 204 mg/dL — ABNORMAL HIGH (ref 70–99)
Glucose-Capillary: 145 mg/dL — ABNORMAL HIGH (ref 70–99)
Glucose-Capillary: 126 mg/dL — ABNORMAL HIGH (ref 70–99)

## 2024-10-17 LAB — PHOSPHORUS
Phosphorus: 2.8 mg/dL (ref 2.5–4.6)
Phosphorus: 2.4 mg/dL — ABNORMAL LOW (ref 2.5–4.6)

## 2024-10-17 LAB — MAGNESIUM: Magnesium: 2.1 mg/dL (ref 1.7–2.4)

## 2024-10-17 MED ORDER — ORAL CARE MOUTH RINSE: 15.0000 mL | OROMUCOSAL | Status: DC | PRN

## 2024-10-17 MED ORDER — MUPIROCIN 2 % EX OINT: 1.0000 | TOPICAL_OINTMENT | Freq: Two times a day (BID) | CUTANEOUS | 0 refills | Status: DC

## 2024-10-17 MED ORDER — SULFAMETHOXAZOLE-TRIMETHOPRIM 800-160 MG PO TABS: 1.0000 | ORAL_TABLET | Freq: Two times a day (BID) | ORAL | 0 refills | Status: AC

## 2024-10-17 MED ORDER — ACETAMINOPHEN 160 MG/5ML PO SOLN: 650.0000 mg | Freq: Four times a day (QID) | ORAL | Status: DC | PRN

## 2024-10-17 MED ORDER — CITRUCEL 500 MG PO TABS: 500.0000 mg | ORAL_TABLET | Freq: Three times a day (TID) | ORAL | 0 refills | Status: AC | PRN

## 2024-10-17 MED ORDER — MIDODRINE HCL 10 MG PO TABS: 10.0000 mg | ORAL_TABLET | Freq: Three times a day (TID) | ORAL | Status: AC

## 2024-10-17 MED ORDER — FUROSEMIDE 20 MG PO TABS: 20.0000 mg | ORAL_TABLET | Freq: Every day | ORAL | Status: AC

## 2024-10-17 MED ORDER — SULFAMETHOXAZOLE-TRIMETHOPRIM 800-160 MG PO TABS
1.0000 | ORAL_TABLET | Freq: Two times a day (BID) | ORAL | Status: DC
  Administered 2024-10-17: 1
  Filled 2024-10-17: qty 1

## 2024-10-17 MED ORDER — FREE WATER: 150.0000 mL | Status: DC

## 2024-10-17 MED ORDER — ORAL CARE MOUTH RINSE: 15.0000 mL | Freq: Four times a day (QID) | OROMUCOSAL | Status: DC

## 2024-10-17 MED ORDER — POLYVINYL ALCOHOL 1.4 % OP SOLN: 2.0000 [drp] | OPHTHALMIC | 0 refills | Status: DC | PRN

## 2024-10-17 MED ORDER — DOCUSATE SODIUM 100 MG PO CAPS: 100.0000 mg | ORAL_CAPSULE | Freq: Two times a day (BID) | ORAL | 0 refills | Status: DC | PRN

## 2024-10-17 MED ORDER — INSULIN GLARGINE-YFGN 100 UNIT/ML ~~LOC~~ SOLN
30.0000 [IU] | Freq: Every day | SUBCUTANEOUS | Status: DC
  Administered 2024-10-17: 30 [IU] via SUBCUTANEOUS
  Filled 2024-10-17: qty 0.3

## 2024-10-17 MED ORDER — GATIFLOXACIN 0.5 % OP SOLN: 1.0000 [drp] | Freq: Four times a day (QID) | OPHTHALMIC | 0 refills | Status: AC

## 2024-10-17 MED ORDER — GERHARDT'S BUTT CREAM: 1.0000 | TOPICAL_CREAM | Freq: Two times a day (BID) | CUTANEOUS | Status: DC

## 2024-10-17 NOTE — Plan of Care (Signed)
  Problem: Clinical Measurements: Goal: Respiratory complications will improve Outcome: Progressing Goal: Cardiovascular complication will be avoided Outcome: Progressing   Problem: Nutritional: Goal: Maintenance of adequate nutrition will improve Outcome: Progressing   Problem: Education: Goal: Knowledge of General Education information will improve Description: Including pain rating scale, medication(s)/side effects and non-pharmacologic comfort measures Outcome: Not Progressing

## 2024-10-17 NOTE — Plan of Care (Signed)

## 2024-10-17 NOTE — TOC Transition Note (Signed)
 Transition of Care [facility]) - Discharge Note   Patient Details  Name: Joshua Haley MRN: [id number] Date of Birth: [birth year]  [facility] CM/SW Contact:  Jon ONEIDA Anon, RN Phone Number: 10/17/2024, 4:09 PM   Clinical Narrative:    Pt is to be discharged back to [facility]. NCM faxed DC summary to [phone] @ 1407. DC packet placed at RN station with DNR inside. Floor RN given number to call report at [phone] to room 301. PTAR was called at [phone]. There are no further ICM needs at this time.      Final next level of care: Long Term Acute Care (LTAC) Barriers to Discharge: Barriers Resolved   Patient Goals and CMS Choice Patient states their goals for this hospitalization and ongoing recovery are:: To return to [facility] CMS Medicare.gov Compare Post Acute Care list provided to:: Other (Comment Required) (NA) Choice offered to / list presented to : NA [place] ownership interest in [facility].provided to:: Parent NA    Discharge Placement              Patient chooses bed at:  ([facility]) Patient to be transferred to facility by: PTAR      Discharge Plan and Services Additional resources added to the After Visit Summary for   In-house Referral: NA Discharge Planning Services: CM Consult Post Acute Care Choice: Long Term Acute Care (LTAC)          DME Arranged: N/A DME Agency: NA       HH Arranged: NA HH Agency: NA        Social Drivers of Health (SDOH) Interventions SDOH Screenings   Food Insecurity: Patient Unable To Answer (10/15/2024)  Housing: Patient Unable To Answer (10/15/2024)  Transportation Needs: Patient Unable To Answer (10/15/2024)  Utilities: Patient Unable To Answer (10/15/2024)  Depression (PHQ2-9): Low Risk  (02/22/2020)  Social Connections: Patient Unable To Answer (10/15/2024)  Tobacco Use: Medium Risk (10/15/2024)     Readmission Risk Interventions    10/16/2024    4:35 PM  Readmission Risk  Prevention Plan  Transportation Screening Complete  PCP or Specialist Appt within 5-7 Days Complete  Home Care Screening Complete  Medication Review (RN CM) Complete

## 2024-10-17 NOTE — Progress Notes (Signed)
 Found patient's PEG tube broken. Charge nurse and provider notified and at bedside to assess. PTAR informed of our findings. Image attached.

## 2024-10-17 NOTE — Discharge Summary (Signed)
 Physician Discharge Summary   Patient: Joshua Haley MRN: [id number] DOB: [birth year]  Admit date:     10/13/2024  Discharge date: 10/17/24  Discharge Physician: Alejandro Marker, DO   PCP: Fleeta Valeria Mayo, MD   Recommendations at discharge:   Follow-up with PCP within 1 to 2 weeks repeat CBC, CMP, mag, Phos within 1 week Have Foley catheter changed out every 3 to 4 weeks  Discharge Diagnoses: Principal Problem:   Sepsis (HCC) Active Problems:   Moderate protein-calorie malnutrition   Pressure injury of skin  Resolved Problems:   * No resolved hospital problems. *  Hospital Course: Joshua Haley is a 73 year old male with history of TBI with baseline mental status is awake but not alert or verbal, hypertension, DMII, CAD and chronic tracheostomy who presented to [facility] ER from [facility] with concern for sepsis due to urinary tract source. He was treated with ciprofloxacin starting on 10/13 and a urine culture from [facility] is growing ESBL E. Coli.    In ER, patient remained hypotensive despite fluid bolus resuscitation. He was started on peripheral levophed . Blood cultures were drawn, foley exchanged and urine culture sent and started on meropenem  antibiotic.    Unable to obtain history from patient as he is non-verbal. PCCM called for admission for septic shock  **Interim History  He had to be placed on short-term pressors for his ESBL E. coli UTI and bacteremia and steadily improved.Transferred to the [facility] service on 10/16/2024.  Weaned off of peripheral Levophed  and is being continued on antibiotics with meropenem  and being changed to Bactrim for a total of 10 days.  He is improved and back to his baseline and medically stable to be discharged back to LTAC.  Assessment and Plan:  Septic Shock due to ESBL E. Coli UTI and Bacteremia: Chronic foley exchanged by PCCM. Follow up blood and urine culture results. UTI present on admission based on culture from [facility] 10/16. Off of Pressors  now and BP stable. Changing IV Meropenem  to po Bactrim to complete 10 Days. C/w Home Midodrine  10 mg per Tubbe TID -WBC Trend Improved :  Recent Labs  Lab 10/13/24 0705 10/14/24 0255 10/16/24 0906 10/17/24 0332  WBC 13.7* 11.0* 3.9* 4.9  -CTM for S/Sx of Infection. C/w Acetaminophen  650 mg per Tube q6hprn for Fever -Exchange Foley every 3-4 weeks and consider Suprapubic Catheter    Hypophosphatemia: Phos Level improved and went from 2.0 -> 2.8. Replete w/ IV K Phos  30 mmol yesterday. 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 (Active)     Wound 10/13/24 1336 Pressure Injury Hip Anterior;Left;Proximal Stage 4 - Full thickness tissue loss with exposed bone, tendon or muscle. (Active)     Wound care  Every shift       Comments: Left Hip: Cleanse with Vashe #[id number], nor rinse, pat dry the peri-wound skin. Pack and cover the wound bed with Aquacel #[id number], top with foam dressing. Change daily or PRN.   10/13/24 1150      10/13/24 1147   Wound care  Every shift       Comments: Sacrum and buttocks: Cleanse with Vashe #[id number], not rinse. Apply Aquacel [id number] into the wound bed on sacrum, cover the entire area with sacrum  foam dressing, including the DTI buttocks. Change daily or PRN soiling. If the foam is not saturated or soiling, it can remain on for up to 3 days, Ok to lift and reapply the Aquacel.   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 Continue red cap. SpO2: 97 % O2 Flow Rate (L/min): 2 L/min FiO2 (%): 28 %. CTM Respiratory Status carefully    ? Conjunctivitis: C/w Artificial Tears; Order Gatifloxacin 1 Drop Both Eyes 4x Daily  TBI: Chronic. Continue Modafinil  100 mg per Tube Daily and Sertraline  25 mg  per Tube Daily, and C/w Amantadine  100 mg per Tube Daily; Cw Levetiracetam  500 mg per Tube BID; C/w Supportive Care   Hypothyroidism: C/w Levothyroxine  100 mcg per Tube and Liothyronine  7.5 mcg per Tube q12h. Check TSH in the outpatient setting   Hypernatremia: Na+ Trend Improved:  Recent Labs  Lab 10/13/24 0705 10/13/24 1500 10/14/24 0255 10/15/24 0752 10/16/24 0906 10/17/24 0332  NA 150* 147* 149* 149* 150* 141  -Was placed on D5W @ 75 mL/hr x 1 Day and now will stop. Changing 300 mL FWF to 150 mL q4h   DMII: C/w Moderate Novolog  SSI q4h and Semglee  20 units sq Daily. CTM CBG's per Protocol. HbA1c is 7.7. CBG Trend ranging from 204-248 on the last 4 checks   Nutrition: Tube Feeds per RD and now being transitioned back to Glucerna 1.5 @ 60 mL/hr and once tolerating this TF Regiment will be placed on 1 packet of Juven BID; Was getting 300 mL FWF per Tube q4h but reduce to 150 mL q4h  Hypoalbuminemia: Patient's Albumin Lvl 2.9 -> 2.6 -> 2.5. CTM and Trend and Repeat CMP in the AM  Non-severe (Moderate) Malnutrition in the Context of Chronic illness: Nutrition Status: Nutrition Problem: Moderate Malnutrition Etiology: chronic illness Signs/Symptoms: mild fat depletion, mild muscle depletion Interventions: Refer to RD note for recommendations, Tube feeding Nutrition Documentation    Flowsheet Row ED to Hosp-Admission (Current) from 10/13/2024 in [facility]-ICU/STEPDOWN  Nutrition Problem Moderate Malnutrition  Etiology chronic illness  Nutrition Goal Patient will meet greater than or equal to 90% of their needs  Interventions Refer to RD note for recommendations, Tube feeding  ,  Active Pressure Injury/Wound(s)     None          Consultants: PCCM/Pulmonary  Procedures performed: As delineated as above   Disposition: LTAC  Diet recommendation:  NPO But TF per [facility] w/ Glucerna 1.5 @ 60 mL/hr  DISCHARGE MEDICATION: Allergies as of 10/17/2024        Reactions   Morphine And Codeine Other (See Comments)   Not specified on MAR    Penicillins Other (See Comments)   Has tolerated Cefepime  and Ceftriaxone    Statins Nausea And Vomiting        Medication List     PAUSE taking these medications    metoprolol  tartrate 25 MG tablet Wait to take this until your doctor or other care provider tells you to start again. Commonly known as: LOPRESSOR  Place 12.5 mg into feeding tube 2 (two) times daily.       STOP taking these medications    ciprofloxacin 500 MG tablet Commonly known as: CIPRO       TAKE these medications    acetaminophen  160 MG/5ML solution Commonly known as: TYLENOL  Place 20.3 mLs (650 mg total) into feeding tube every 6 (six) hours as needed for fever.   amantadine  50 MG/5ML solution  Commonly known as: SYMMETREL  Place 100 mg into feeding tube daily.   artificial tears ophthalmic solution Place 2 drops into both eyes as needed for dry eyes.   ascorbic acid  500 MG tablet Commonly known as: VITAMIN C  Place 500 mg into feeding tube daily. Give 500mg  per tube daily   cetirizine 10 MG tablet Commonly known as: ZYRTEC Take 10 mg by mouth daily.   Citrucel 500 MG Tabs Generic drug: Methylcellulose (Laxative) Place 1 tablet (500 mg total) into feeding tube 3 (three) times daily as needed (Constipation). What changed:  when to take this reasons to take this   docusate sodium  100 MG capsule Commonly known as: COLACE Take 1 capsule (100 mg total) by mouth 2 (two) times daily as needed for mild constipation.   enoxaparin  40 MG/0.4ML injection Commonly known as: LOVENOX  Inject 40 mg into the skin daily.   feeding supplement (GLUCERNA 1.5 CAL) Liqd Place 60 mL/hr into feeding tube See admin instructions. Give 63mL/hr per tube by shift   Arginaid Pack Place 1 packet into feeding tube 2 (two) times daily. Arginaid 4.5gram-156mg /9.2 gram oral powder packet   ferrous sulfate  300 (60 Fe) MG/5ML syrup Place  300 mg into feeding tube 2 (two) times daily.   free water  Soln Place 150 mLs into feeding tube every 4 (four) hours. What changed: how much to take   furosemide  20 MG tablet Commonly known as: LASIX  Place 1 tablet (20 mg total) into feeding tube daily. What changed: when to take this   gatifloxacin 0.5 % Soln Commonly known as: ZYMAXID Place 1 drop into both eyes 4 (four) times daily.   Gerhardt's butt cream Crea Apply 1 Application topically 2 (two) times daily.   insulin  aspart 100 UNIT/ML injection Commonly known as: novoLOG  insulin  aspart (novoLOG ) injection 0-15 Units 0-15 Units, Subcutaneous, Every 4 hours,  CBG < 70: Implement Hypoglycemia Standing Orders and refer to Hypoglycemia Standing Orders sidebar report CBG 70 - 120: 0 units CBG 121 - 150: 2 units CBG 151 - 200: 3 units CBG 201 - 250: 5 units CBG 251 - 300: 8 units CBG 301 - 350: 11 units CBG 351 - 400: 15 units CBG > 400: call MD What changed:  how much to take how to take this when to take this additional instructions   insulin  glargine 100 UNIT/ML injection Commonly known as: LANTUS  Inject 0.25 mLs (25 Units total) into the skin daily. What changed: how much to take   lansoprazole 30 MG capsule Commonly known as: PREVACID Give 1 capsule (30mg ) per G tube daily   leptospermum manuka honey Pste paste Apply 1 Application topically daily.   levETIRAcetam  500 MG tablet Commonly known as: KEPPRA  Place 1 tablet (500 mg total) into feeding tube 2 (two) times daily.   levothyroxine  100 MCG tablet Commonly known as: SYNTHROID  Place 100 mcg into feeding tube daily. Give 100mcg per tube daily   liothyronine  5 MCG tablet Commonly known as: CYTOMEL  Place 7.5 mcg into feeding tube every 12 (twelve) hours. Give one and one half tablet (7.5mg ) per tube every 12 hours   midodrine  10 MG tablet Commonly known as: PROAMATINE  Place 1 tablet (10 mg total) into feeding tube 3 (three) times daily with meals. What  changed: how to take this   modafinil  100 MG tablet Commonly known as: PROVIGIL  Place 1 tablet (100 mg total) into feeding tube daily.   mouth rinse Liqd solution 15 mLs by Mouth Rinse route as needed (oral care).  mouth rinse Liqd solution 15 mLs by Mouth Rinse route 4 (four) times daily.   multivitamin Liqd Place 15 mLs into feeding tube daily. What changed:  how much to take additional instructions   mupirocin ointment 2 % Commonly known as: BACTROBAN Place 1 Application into the nose 2 (two) times daily.   Nexletol 180 MG Tabs Generic drug: Bempedoic Acid Take 180 mg by mouth daily at 12 noon.   omega-3 acid ethyl esters 1 g capsule Commonly known as: LOVAZA  Place 1 g into feeding tube daily. Give 1 g per tube daily   ondansetron  4 MG disintegrating tablet Commonly known as: ZOFRAN -ODT 4 mg every 6 (six) hours as needed for nausea or vomiting. Per tube   polyethylene glycol 17 g packet Commonly known as: MIRALAX  / GLYCOLAX  Give 17g per tube daily as needed for constipation   Pro-Stat AWC Liqd Place 30 mLs into feeding tube daily.   sertraline  25 MG tablet Commonly known as: ZOLOFT  Place 25 mg into feeding tube daily. Give 25mg  per tube daily   sodium chloride  HYPERTONIC 3 % nebulizer solution Take 4 mLs by nebulization 2 (two) times daily.   sodium chloride  irrigation 0.9 % irrigation Irrigate with 60 mLs as directed every 8 (eight) hours. Irrigate indwelling foley with 60mL sterile saline every 8 hours.   sulfamethoxazole-trimethoprim 800-160 MG tablet Commonly known as: BACTRIM DS Place 1 tablet into feeding tube every 12 (twelve) hours for 5 days.               Discharge Care Instructions  (From admission, onward)           Start     Ordered   10/17/24 0000  Discharge wound care:       Comments: Wound care  Every shift      Comments: Left Hip: Cleanse with Vashe #[id number], nor rinse, pat dry the peri-wound skin. Pack and cover the wound  bed with Aquacel #[id number], top with foam dressing. Change daily or PRN.  10/13/24 1150     10/13/24 1147    Wound care  Every shift      Comments: Sacrum and buttocks: Cleanse with Vashe #[id number], not rinse. Apply Aquacel [id number] into the wound bed on sacrum, cover the entire area with sacrum foam dressing, including the DTI buttocks. Change daily or PRN soiling. If the foam is not saturated or soiling, it can remain on for up to 3 days, Ok to lift and reapply the Aquacel.  10/13/24 1150   10/17/24 1349           Discharge Exam: Filed Weights   10/15/24 0500 10/16/24 0335 10/17/24 0312  Weight: 82.9 kg 82.7 kg 82.4 kg   Vitals:   10/17/24 1000 10/17/24 1153  BP: (!) 115/58   Pulse: 79   Resp: 20   Temp:  98.4 F (36.9 C)  SpO2: 97%    Examination: Physical Exam:  Constitutional: Chronically ill-appearing elderly Caucasian male who is in no acute distress and is nonverbal from his TBI Respiratory: Diminished to auscultation bilaterally with some coarse breath sounds, no wheezing, rales, rhonchi or crackles. Normal respiratory effort and patient is not tachypenic. No accessory muscle use.  Has a tracheostomy in place but this is capped and he is currently wearing 2 L supplemental oxygen via nasal cannula Cardiovascular: RRR, no murmurs / rubs / gallops. S1 and S2 auscultated.  Mild lower extremity edema Abdomen: Soft, non-tender, non-distended.  PEG tube is in place. Bowel sounds positive.  GU: Deferred.  Foley catheter is in place Musculoskeletal: No clubbing / cyanosis of digits/nails. No joint deformity upper and lower extremities does have some atrophy in the setting of his TBI.  Skin: Did not turn to view his hip wound or his sacral wounds.  Neurologic: Does not follow commands and is not verbal Psychiatric: Appears calm  Condition at discharge: stable  The results of significant diagnostics from this hospitalization (including imaging, microbiology, ancillary and  laboratory) are listed below for reference.   Imaging Studies: CT CHEST ABDOMEN PELVIS W CONTRAST Result Date: 10/13/2024 CLINICAL DATA:  Sepsis.  Concern for rectal fistula. EXAM: CT CHEST, ABDOMEN, AND PELVIS WITH CONTRAST TECHNIQUE: Multidetector CT imaging of the chest, abdomen and pelvis was performed following the standard protocol during bolus administration of intravenous contrast. RADIATION DOSE REDUCTION: This exam was performed according to the departmental dose-optimization program which includes automated exposure control, adjustment of the mA and/or kV according to patient size and/or use of iterative reconstruction technique. CONTRAST:  OMNIPAQUE  IOHEXOL  300 MG/ML  SOLN COMPARISON:  CT stone study 12/12/2022 FINDINGS: CT CHEST FINDINGS Cardiovascular: The heart size is normal. No substantial pericardial effusion. Mild atherosclerotic calcification is noted in the wall of the thoracic aorta. Ascending thoracic aorta measures 4.0 cm diameter. Mediastinum/Nodes: No mediastinal lymphadenopathy. There is no hilar lymphadenopathy. The esophagus has normal imaging features. There is no axillary lymphadenopathy. Lungs/Pleura: Tracheostomy tube evident. Mucus/secretions noted in the trachea and right mainstem bronchus. Consolidative airspace disease in the right lower lobe is compatible with pneumonia. Aspiration not excluded. Subtle ground-glass opacity is seen in the lingula and posterior left lower lobe compatible with atelectasis or infection. No substantial pleural effusion. Musculoskeletal: No worrisome lytic or sclerotic osseous abnormality. CT ABDOMEN PELVIS FINDINGS Hepatobiliary: A tiny hypodensity in the liver parenchyma is too small to characterize but is statistically most likely benign. No followup imaging is recommended. Gallbladder is surgically absent. No intrahepatic or extrahepatic biliary dilation. Pancreas: No focal mass lesion. No dilatation of the main duct. No  intraparenchymal cyst. No peripancreatic edema. Spleen: No splenomegaly. No suspicious focal mass lesion. Adrenals/Urinary Tract: No adrenal nodule or mass. 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 Enhancement of the renal pelvis urothelium is likely due to irritation from the stone disease bilaterally. Mild fullness noted in both intrarenal collecting system without overt hydronephrosis. No ureteral stones. No hydroureter. Numerous calcified bladder stones of varying size evident. Bladder is decompressed by Foley catheter. Stomach/Bowel: Gastrostomy tube is noted in the stomach. Duodenum is normally positioned as is the ligament of Treitz. No small bowel wall thickening. No small bowel dilatation. The terminal ileum is normal. The appendix is normal. No gross colonic mass. No colonic wall thickening. Diverticuli are seen scattered along the entire length of the colon without CT findings of diverticulitis. Prominent rectal stool volume. Vascular/Lymphatic: There is moderate atherosclerotic calcification of the abdominal aorta without aneurysm. There is no gastrohepatic or hepatoduodenal ligament lymphadenopathy. No retroperitoneal or mesenteric lymphadenopathy. No pelvic sidewall lymphadenopathy. Reproductive: The prostate gland and seminal vesicles are unremarkable. Other: No intraperitoneal free fluid. Musculoskeletal: Nodular soft tissue density in the subcutaneous fat of the low anterior left abdominal wall probably represents injection granulomata. Tiny gas bubbles in this region on 118/2 are probably from an injection site. There is  body wall edema in the pelvis, left greater than right. Open wound posterior to the left hip is consistent with a decubitus ulcer containing gas and debris. Scarring is seen posterior to the  right hip. Bones are diffusely demineralized with degenerative changes in both hips IMPRESSION: 1. Consolidative airspace disease in the right lower lobe is compatible with pneumonia. Aspiration a concern. 2. Subtle ground-glass opacity in the lingula and posterior left lower lobe compatible with atelectasis or infection. 3. 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 Enhancement of the renal pelvis urothelium is likely due to irritation from the stone disease bilaterally although urinary tract infection cannot be excluded by imaging. Mild fullness noted in both intrarenal collecting system without overt hydronephrosis. No ureteral stones. 4. Numerous calcified bladder stones of varying size. 5. Prominent rectal stool volume. No findings to suggest perirectal abscess. No overt evidence for rectal fistula. 6. Open wound posterior to the left hip is consistent with a decubitus ulcer containing gas and debris. 7.  Aortic Atherosclerosis (ICD10-I70.0). Electronically Signed   By: Camellia Candle M.D.   On: 10/13/2024 11:13   DG Chest Port 1 View Result Date: 10/13/2024 EXAM: 1 VIEW(S) XRAY OF THE CHEST 10/13/2024 09:28:00 AM COMPARISON: Comparison 01/22/2023. CLINICAL HISTORY: Questionable sepsis - evaluate for abnormality. Per chart - Possible sepsis, fever, tachy, from [facility], pt is responsive to pain occasionally per EMS who got report from RN at facility. FINDINGS: LUNGS AND PLEURA: No focal pulmonary opacity. No pulmonary edema. No pleural effusion. No pneumothorax. 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 Sternotomy wires are noted. BONES AND SOFT TISSUES: No acute osseous abnormality. IMPRESSION: 1. No acute cardiopulmonary disease. Electronically signed by: Lynwood Seip MD 10/13/2024 09:45 AM EDT RP  Workstation: [id number]   Microbiology: Results for orders placed or performed during the hospital encounter of 10/13/24  Resp panel by RT-PCR (RSV, Flu A&B, Covid)     Status: None   Collection Time: 10/13/24  8:01 AM   Specimen: Nasal Swab  Result Value Ref Range Status   SARS Coronavirus 2 by RT PCR NEGATIVE NEGATIVE Final    Comment: (NOTE) SARS-CoV-2 target nucleic acids are NOT DETECTED.  The SARS-CoV-2 RNA is generally detectable in upper respiratory specimens during the acute phase of infection. The lowest concentration of SARS-CoV-2 viral copies this assay can detect is 138 copies/mL. A negative result does not preclude SARS-Cov-2 infection and should not be used as the sole basis for treatment or other patient management decisions. A negative result may occur with  improper specimen collection/handling, submission of specimen other than nasopharyngeal swab, presence of viral mutation(s) within the areas targeted by this assay, and inadequate number of viral copies(<138 copies/mL). A negative result must be combined with clinical observations, patient history, and epidemiological information. The expected result is Negative.  Fact Sheet for Patients:  BloggerCourse.com  Fact Sheet for Healthcare Providers:  SeriousBroker.it  This test is no t yet approved or cleared by the [place]  FDA and  has been authorized for detection and/or diagnosis of SARS-CoV-2 by FDA under an Emergency Use Authorization (EUA). This EUA will remain  in effect (meaning this test can be used) for the duration of the COVID-19 declaration under Section 564(b)(1) of the Act, 21 U.S.C.section 360bbb-3(b)(1), unless the authorization is terminated  or revoked sooner.       Influenza A by PCR NEGATIVE NEGATIVE Final   Influenza B by PCR NEGATIVE NEGATIVE Final    Comment: (NOTE) The Xpert Xpress SARS-CoV-2/FLU/RSV plus assay is intended as an  aid in the diagnosis of influenza from Nasopharyngeal swab specimens and should not be used as a sole basis for treatment. Nasal washings and aspirates are unacceptable for Xpert Xpress SARS-CoV-2/FLU/RSV testing.  Fact Sheet for Patients: BloggerCourse.com  Fact Sheet for Healthcare Providers: SeriousBroker.it  This test is not yet approved or cleared by the [place]  FDA and has been authorized for detection and/or diagnosis of SARS-CoV-2 by FDA under an Emergency Use Authorization (EUA). This EUA will remain in effect (meaning this test can be used) for the duration of the COVID-19 declaration under Section 564(b)(1) of the Act, 21 U.S.C. section 360bbb-3(b)(1), unless the authorization is terminated or revoked.     Resp Syncytial Virus by PCR NEGATIVE NEGATIVE Final    Comment: (NOTE) Fact Sheet for Patients: BloggerCourse.com  Fact Sheet for Healthcare Providers: SeriousBroker.it  This test is not yet approved or cleared by the [place]  FDA and has been authorized for detection and/or diagnosis of SARS-CoV-2 by FDA under an Emergency Use Authorization (EUA). This EUA will remain in effect (meaning this test can be used) for the duration of the COVID-19 declaration under Section 564(b)(1) of the Act, 21 U.S.C. section 360bbb-3(b)(1), unless the authorization is terminated or revoked.  Performed at [facility], [address], [place], [place] [address]   Blood Culture (routine x 2)     Status: Abnormal   Collection Time: 10/13/24  8:01 AM   Specimen: BLOOD  Result Value Ref Range Status   Specimen Description   Final    BLOOD Performed at [facility], [address], [place], [place] [address]    Special Requests   Final    BOTTLES DRAWN AEROBIC AND ANAEROBIC Blood Culture adequate volume Performed at [facility], [address], [place], [place] [address]    Culture  Setup Time   Final    GRAM NEGATIVE RODS IN BOTH AEROBIC AND ANAEROBIC BOTTLES CRITICAL RESULT CALLED TO, READ BACK BY AND VERIFIED WITH: [facility] CRYSTAL ROBERTSON 2034 10/13/24 PR Performed at [facility], [address] [place], [place] [address]    Culture (A)  Final    ESCHERICHIA COLI Confirmed Extended Spectrum Beta-Lactamase Producer (ESBL).  In bloodstream infections from ESBL organisms, carbapenems are preferred over piperacillin/tazobactam. They are shown to have a lower risk of mortality.    Report Status 10/15/2024 FINAL  Final   Organism ID, Bacteria ESCHERICHIA COLI  Final      Susceptibility   Escherichia coli - MIC*    AMPICILLIN >=32 RESISTANT Resistant     CEFAZOLIN (NON-URINE) >=32 RESISTANT Resistant     CEFEPIME  >=32 RESISTANT Resistant     ERTAPENEM <=0.12 SENSITIVE Sensitive     CEFTRIAXONE  >=64 RESISTANT Resistant     CIPROFLOXACIN >=4 RESISTANT Resistant     GENTAMICIN <=1 SENSITIVE Sensitive     MEROPENEM  <=0.25 SENSITIVE Sensitive     TRIMETH/SULFA <=20 SENSITIVE Sensitive     AMPICILLIN/SULBACTAM >=32 RESISTANT Resistant     PIP/TAZO Value in next row Sensitive      16 SENSITIVEThis is a modified FDA-approved test that has been validated and its performance characteristics determined by the reporting laboratory.  This laboratory is certified under the Clinical Laboratory Improvement  Amendments CLIA as qualified to perform high complexity clinical laboratory testing.    * ESCHERICHIA COLI  Blood Culture (routine x 2)     Status: None (Preliminary result)   Collection Time: 10/13/24  8:01 AM   Specimen: BLOOD LEFT FOREARM  Result Value Ref Range Status   Specimen Description   Final    BLOOD LEFT FOREARM Performed at [facility], [address], [place], [place] [address]    Special Requests   Final    BOTTLES DRAWN AEROBIC AND ANAEROBIC Blood Culture adequate  volume Performed at [facility], [address], [place], [place] [address]    Culture   Final    NO GROWTH 4 DAYS Performed at [facility], [address], [place], [place] [address]    Report Status PENDING  Incomplete  Blood Culture ID Panel (Reflexed)     Status: Abnormal   Collection Time: 10/13/24  8:01 AM  Result Value Ref Range Status   Enterococcus faecalis NOT DETECTED NOT DETECTED Final   Enterococcus Faecium NOT DETECTED NOT DETECTED Final   Listeria monocytogenes NOT DETECTED NOT DETECTED Final   Staphylococcus species NOT DETECTED NOT DETECTED Final   Staphylococcus aureus (BCID) NOT DETECTED NOT DETECTED Final   Staphylococcus epidermidis NOT DETECTED NOT DETECTED Final   Staphylococcus lugdunensis NOT DETECTED NOT DETECTED Final   Streptococcus species NOT DETECTED NOT DETECTED Final   Streptococcus agalactiae NOT DETECTED NOT DETECTED Final   Streptococcus pneumoniae NOT DETECTED NOT DETECTED Final   Streptococcus pyogenes NOT DETECTED NOT DETECTED Final   A.calcoaceticus-baumannii NOT DETECTED NOT DETECTED Final   Bacteroides fragilis NOT DETECTED NOT DETECTED Final   Enterobacterales DETECTED (A) NOT DETECTED Final    Comment: Enterobacterales represent a large order of gram negative bacteria, not a single organism. CRITICAL RESULT CALLED TO, READ BACK BY AND VERIFIED WITH: PHARMD CRYSTAL ROBERTSON 2034 10/13/24 PR    Enterobacter cloacae complex NOT DETECTED NOT DETECTED Final   Escherichia coli DETECTED (A) NOT DETECTED Final    Comment: CRITICAL RESULT CALLED TO, READ BACK BY AND VERIFIED WITH: PHARMD CRYSTAL ROBERTSON 2034 10/13/24 PR    Klebsiella aerogenes NOT DETECTED NOT DETECTED Final   Klebsiella oxytoca NOT DETECTED NOT DETECTED Final   Klebsiella pneumoniae NOT DETECTED NOT DETECTED Final   Proteus species NOT DETECTED NOT DETECTED Final   Salmonella species NOT DETECTED NOT DETECTED Final   Serratia marcescens NOT  DETECTED NOT DETECTED Final   Haemophilus influenzae NOT DETECTED NOT DETECTED Final   Neisseria meningitidis NOT DETECTED NOT DETECTED Final   Pseudomonas aeruginosa NOT DETECTED NOT DETECTED Final   Stenotrophomonas maltophilia NOT DETECTED NOT DETECTED Final   Candida albicans NOT DETECTED NOT DETECTED Final   Candida auris NOT DETECTED NOT DETECTED Final   Candida glabrata NOT DETECTED NOT DETECTED Final   Candida krusei NOT DETECTED NOT DETECTED Final   Candida parapsilosis NOT DETECTED NOT DETECTED Final   Candida tropicalis NOT DETECTED NOT DETECTED Final   Cryptococcus neoformans/gattii NOT DETECTED NOT DETECTED Final   CTX-M ESBL DETECTED (A) NOT DETECTED Final    Comment: CRITICAL RESULT CALLED TO, READ BACK BY AND VERIFIED WITH: PHARMD CRYSTAL ROBERTSON 2034 10/13/24 PR (NOTE) Extended spectrum beta-lactamase detected. Recommend a carbapenem as initial therapy.      Carbapenem resistance IMP NOT DETECTED NOT DETECTED Final   Carbapenem resistance KPC NOT DETECTED NOT DETECTED Final   Carbapenem resistance NDM NOT DETECTED NOT DETECTED Final  Carbapenem resist OXA 48 LIKE NOT DETECTED NOT DETECTED Final   Carbapenem resistance VIM NOT DETECTED NOT DETECTED Final    Comment: Performed at [facility], [address] [place], [place] [address]  Remove and replace urinary cath (placed > 5 days) then obtain urine culture from new indwelling urinary catheter.     Status: Abnormal   Collection Time: 10/13/24 10:12 AM   Specimen: Urine, Catheterized  Result Value Ref Range Status   Specimen Description URINE, CATHETERIZED  Final   Special Requests NONE  Final   Culture (A)  Final    60,000 COLONIES/mL ESCHERICHIA COLI Confirmed Extended Spectrum Beta-Lactamase Producer (ESBL).  In bloodstream infections from ESBL organisms, carbapenems are preferred over piperacillin/tazobactam. They are shown to have a lower risk of mortality.    Report Status 10/15/2024 FINAL  Final    Organism ID, Bacteria ESCHERICHIA COLI (A)  Final      Susceptibility   Escherichia coli - MIC*    AMPICILLIN >=32 RESISTANT Resistant     CEFAZOLIN (URINE) Value in next row Resistant      >=32 RESISTANTThis is a modified FDA-approved test that has been validated and its performance characteristics determined by the reporting laboratory.  This laboratory is certified under the Clinical Laboratory Improvement Amendments CLIA as qualified to perform high complexity clinical laboratory testing.    CEFEPIME  Value in next row Resistant      >=32 RESISTANTThis is a modified FDA-approved test that has been validated and its performance characteristics determined by the reporting laboratory.  This laboratory is certified under the Clinical Laboratory Improvement Amendments CLIA as qualified to perform high complexity clinical laboratory testing.    ERTAPENEM Value in next row Sensitive      >=32 RESISTANTThis is a modified FDA-approved test that has been validated and its performance characteristics determined by the reporting laboratory.  This laboratory is certified under the Clinical Laboratory Improvement Amendments CLIA as qualified to perform high complexity clinical laboratory testing.    CEFTRIAXONE  Value in next row Resistant      >=32 RESISTANTThis is a modified FDA-approved test that has been validated and its performance characteristics determined by the reporting laboratory.  This laboratory is certified under the Clinical Laboratory Improvement Amendments CLIA as qualified to perform high complexity clinical laboratory testing.    CIPROFLOXACIN Value in next row Resistant      >=32 RESISTANTThis is a modified FDA-approved test that has been validated and its performance characteristics determined by the reporting laboratory.  This laboratory is certified under the Clinical Laboratory Improvement Amendments CLIA as qualified to perform high complexity clinical laboratory testing.    GENTAMICIN  Value in next row Sensitive      >=32 RESISTANTThis is a modified FDA-approved test that has been validated and its performance characteristics determined by the reporting laboratory.  This laboratory is certified under the Clinical Laboratory Improvement Amendments CLIA as qualified to perform high complexity clinical laboratory testing.    NITROFURANTOIN Value in next row Sensitive      >=32 RESISTANTThis is a modified FDA-approved test that has been validated and its performance characteristics determined by the reporting laboratory.  This laboratory is certified under the Clinical Laboratory Improvement Amendments CLIA as qualified to perform high complexity clinical laboratory testing.    TRIMETH/SULFA Value in next row Sensitive      >=32 RESISTANTThis is a modified FDA-approved test that has been validated and its performance characteristics determined by the reporting laboratory.  This laboratory  is certified under the Clinical Laboratory Improvement Amendments CLIA as qualified to perform high complexity clinical laboratory testing.    AMPICILLIN/SULBACTAM Value in next row Intermediate      >=32 RESISTANTThis is a modified FDA-approved test that has been validated and its performance characteristics determined by the reporting laboratory.  This laboratory is certified under the Clinical Laboratory Improvement Amendments CLIA as qualified to perform high complexity clinical laboratory testing.    PIP/TAZO Value in next row Sensitive      <=4 SENSITIVEThis is a modified FDA-approved test that has been validated and its performance characteristics determined by the reporting laboratory.  This laboratory is certified under the Clinical Laboratory Improvement Amendments CLIA as qualified to perform high complexity clinical laboratory testing.    MEROPENEM  Value in next row Sensitive      <=4 SENSITIVEThis is a modified FDA-approved test that has been validated and its performance characteristics  determined by the reporting laboratory.  This laboratory is certified under the Clinical Laboratory Improvement Amendments CLIA as qualified to perform high complexity clinical laboratory testing.    * 60,000 COLONIES/mL ESCHERICHIA COLI  MRSA Next Gen by PCR, Nasal     Status: Abnormal   Collection Time: 10/13/24 12:17 PM   Specimen: Nasal Mucosa; Nasal Swab  Result Value Ref Range Status   MRSA by PCR Next Gen DETECTED (A) NOT DETECTED Final    Comment: RESULT CALLED TO, READ BACK BY AND VERIFIED WITH:  PULEO, R 10/13/24 1419 AJ (NOTE) The GeneXpert MRSA Assay (FDA approved for NASAL specimens only), is one component of a comprehensive MRSA colonization surveillance program. It is not intended to diagnose MRSA infection nor to guide or monitor treatment for MRSA infections. Test performance is not FDA approved in patients less than 34 years old. Performed at [facility], [address], [place], [place] [address]    Labs: CBC: Recent Labs  Lab 10/13/24 [phone] 10/14/24 0255 10/16/24 0906 10/17/24 0332  WBC 13.7* 11.0* 3.9* 4.9  NEUTROABS 12.7*  --  2.4 3.2  HGB 10.3* 8.9* 9.9* 9.2*  HCT 36.7* 31.6* 34.3* 31.5*  MCV 90.8 90.8 91.2 89.7  PLT 402* 365 299 296   Basic Metabolic Panel: Recent Labs  Lab 10/13/24 1500 10/13/24 1716 10/14/24 0255 10/15/24 0752 10/16/24 0232 10/16/24 0906 10/16/24 1706 10/17/24 0332  NA 147*  --  149* 149*  --  150*  --  141  K 4.5  --  3.5 4.1  --  3.5  --  3.9  CL 107  --  111 113*  --  115*  --  108  CO2 26  --  30 25  --  25  --  24  GLUCOSE 333*  --  337* 326*  --  200*  --  258*  BUN 62*  --  49* 36*  --  28*  --  24*  CREATININE 1.19  --  0.91 0.78  --  0.68  --  0.57*  CALCIUM 8.9  --  8.9 8.7*  --  9.2  --  8.5*  MG  --    < > 2.2 2.3 2.2 2.2  --  2.1  PHOS  --    < > 2.0* 2.0* 1.8* 1.8* 2.0* 2.8   < > = values in this interval not displayed.   Liver Function Tests: Recent Labs  Lab 10/13/24 0705  10/16/24 0906 10/17/24 0332  AST 21 30 22   ALT 14 15 16   ALKPHOS 182*  115 132*  BILITOT 0.5 0.2 0.3  PROT 7.4 7.1 6.0*  ALBUMIN 2.9* 2.6* 2.5*   CBG: Recent Labs  Lab 10/16/24 1948 10/16/24 2329 10/17/24 0316 10/17/24 0733 10/17/24 1145  GLUCAP 330* 252* 248* 222* 204*   Discharge time spent: greater than 30 minutes.  Signed: Alejandro Marker, DO [facility] 10/17/2024

## 2024-10-18 LAB — CULTURE, BLOOD (ROUTINE X 2)
Culture: NO GROWTH
Special Requests: ADEQUATE

## 2024-11-01 ENCOUNTER — Other Ambulatory Visit: Payer: Self-pay | Admitting: Internal Medicine

## 2024-12-03 ENCOUNTER — Emergency Department (HOSPITAL_COMMUNITY)

## 2024-12-03 ENCOUNTER — Inpatient Hospital Stay (HOSPITAL_COMMUNITY)
Admission: EM | Admit: 2024-12-03 | Discharge: 2024-12-08 | DRG: 871 | Disposition: A | Discharge status: Nursing Home (Includes ICF) | Attending: Internal Medicine | Admitting: Internal Medicine

## 2024-12-03 DIAGNOSIS — J9621 Acute and chronic respiratory failure with hypoxia: Secondary | ICD-10-CM | POA: Diagnosis present

## 2024-12-03 DIAGNOSIS — J189 Pneumonia, unspecified organism: Secondary | ICD-10-CM | POA: Diagnosis present

## 2024-12-03 DIAGNOSIS — L89154 Pressure ulcer of sacral region, stage 4: Secondary | ICD-10-CM

## 2024-12-03 DIAGNOSIS — L8990 Pressure ulcer of unspecified site, unspecified stage: Secondary | ICD-10-CM | POA: Diagnosis present

## 2024-12-03 DIAGNOSIS — N3000 Acute cystitis without hematuria: Secondary | ICD-10-CM

## 2024-12-03 DIAGNOSIS — A419 Sepsis, unspecified organism: Principal | ICD-10-CM | POA: Diagnosis present

## 2024-12-03 DIAGNOSIS — Z930 Tracheostomy status: Secondary | ICD-10-CM

## 2024-12-03 DIAGNOSIS — E119 Type 2 diabetes mellitus without complications: Secondary | ICD-10-CM

## 2024-12-03 DIAGNOSIS — I2510 Atherosclerotic heart disease of native coronary artery without angina pectoris: Secondary | ICD-10-CM | POA: Diagnosis present

## 2024-12-03 DIAGNOSIS — E870 Hyperosmolality and hypernatremia: Secondary | ICD-10-CM | POA: Diagnosis present

## 2024-12-03 DIAGNOSIS — E039 Hypothyroidism, unspecified: Secondary | ICD-10-CM | POA: Diagnosis present

## 2024-12-03 LAB — I-STAT CHEM 8, ED
BUN: 51 mg/dL — ABNORMAL HIGH (ref 8–23)
Calcium, Ion: 1.11 mmol/L — ABNORMAL LOW (ref 1.15–1.40)
Chloride: 111 mmol/L (ref 98–111)
Creatinine, Ser: 1.1 mg/dL (ref 0.61–1.24)
Glucose, Bld: 392 mg/dL — ABNORMAL HIGH (ref 70–99)
HCT: 35 % — ABNORMAL LOW (ref 39.0–52.0)
Hemoglobin: 11.9 g/dL — ABNORMAL LOW (ref 13.0–17.0)
Potassium: 4.1 mmol/L (ref 3.5–5.1)
Sodium: 153 mmol/L — ABNORMAL HIGH (ref 135–145)
TCO2: 30 mmol/L (ref 22–32)

## 2024-12-03 LAB — PROTIME-INR
INR: 1.1 (ref 0.8–1.2)
Prothrombin Time: 15.2 s (ref 11.4–15.2)

## 2024-12-03 LAB — URINALYSIS, W/ REFLEX TO CULTURE (INFECTION SUSPECTED)
Bilirubin Urine: NEGATIVE
Glucose, UA: NEGATIVE mg/dL
Ketones, ur: NEGATIVE mg/dL
Nitrite: NEGATIVE
Protein, ur: 100 mg/dL — AB
RBC / HPF: >50 RBC/hpf (ref 0–5)
Specific Gravity, Urine: 1.018 (ref 1.005–1.030)
WBC, UA: >50 WBC/hpf (ref 0–5)
pH: 6 (ref 5.0–8.0)

## 2024-12-03 LAB — CBC WITH DIFFERENTIAL/PLATELET
Abs Immature Granulocytes: 0.09 K/uL — ABNORMAL HIGH (ref 0.00–0.07)
Basophils Absolute: 0.1 K/uL (ref 0.0–0.1)
Basophils Relative: 1 %
Eosinophils Absolute: 0.1 K/uL (ref 0.0–0.5)
Eosinophils Relative: 0 %
HCT: 38.1 % — ABNORMAL LOW (ref 39.0–52.0)
Hemoglobin: 10.5 g/dL — ABNORMAL LOW (ref 13.0–17.0)
Immature Granulocytes: 1 %
Lymphocytes Relative: 7 %
Lymphs Abs: 1.1 K/uL (ref 0.7–4.0)
MCH: 26.1 pg (ref 26.0–34.0)
MCHC: 27.6 g/dL — ABNORMAL LOW (ref 30.0–36.0)
MCV: 94.5 fL (ref 80.0–100.0)
Monocytes Absolute: 0.7 K/uL (ref 0.1–1.0)
Monocytes Relative: 5 %
Neutro Abs: 12.4 K/uL — ABNORMAL HIGH (ref 1.7–7.7)
Neutrophils Relative %: 86 %
Platelets: 396 K/uL (ref 150–400)
RBC: 4.03 MIL/uL — ABNORMAL LOW (ref 4.22–5.81)
RDW: 19.4 % — ABNORMAL HIGH (ref 11.5–15.5)
WBC: 14.4 K/uL — ABNORMAL HIGH (ref 4.0–10.5)
nRBC: 0.1 % (ref 0.0–0.2)

## 2024-12-03 LAB — COMPREHENSIVE METABOLIC PANEL WITH GFR
ALT: 13 U/L (ref 0–44)
AST: 19 U/L (ref 15–41)
Albumin: 2.3 g/dL — ABNORMAL LOW (ref 3.5–5.0)
Alkaline Phosphatase: 105 U/L (ref 38–126)
Anion gap: 14 (ref 5–15)
BUN: 47 mg/dL — ABNORMAL HIGH (ref 8–23)
CO2: 28 mmol/L (ref 22–32)
Calcium: 9.3 mg/dL (ref 8.9–10.3)
Chloride: 107 mmol/L (ref 98–111)
Creatinine, Ser: 1 mg/dL (ref 0.61–1.24)
GFR, Estimated: >60 mL/min (ref >60)
Glucose, Bld: 375 mg/dL — ABNORMAL HIGH (ref 70–99)
Potassium: 4.1 mmol/L (ref 3.5–5.1)
Sodium: 149 mmol/L — ABNORMAL HIGH (ref 135–145)
Total Bilirubin: 0.6 mg/dL (ref 0.0–1.2)
Total Protein: 8.5 g/dL — ABNORMAL HIGH (ref 6.5–8.1)

## 2024-12-03 LAB — I-STAT CG4 LACTIC ACID, ED: Lactic Acid, Venous: 2.2 mmol/L (ref 0.5–1.9)

## 2024-12-03 LAB — CBG MONITORING, ED: Glucose-Capillary: 307 mg/dL — ABNORMAL HIGH (ref 70–99)

## 2024-12-03 MED ORDER — SODIUM CHLORIDE 0.9 % IV SOLN
2.0000 g | Freq: Once | INTRAVENOUS | Status: AC
  Administered 2024-12-03: 2 g via INTRAVENOUS
  Filled 2024-12-03: qty 12.5

## 2024-12-03 MED ORDER — ACETAMINOPHEN 650 MG RE SUPP
650.0000 mg | Freq: Once | RECTAL | Status: AC
  Administered 2024-12-03: 650 mg via RECTAL
  Filled 2024-12-03: qty 1

## 2024-12-03 MED ORDER — LACTATED RINGERS IV BOLUS (SEPSIS)
1000.0000 mL | Freq: Once | INTRAVENOUS | Status: AC
  Administered 2024-12-03: 1000 mL via INTRAVENOUS

## 2024-12-03 MED ORDER — METRONIDAZOLE 500 MG/100ML IV SOLN
500.0000 mg | Freq: Once | INTRAVENOUS | Status: AC
  Administered 2024-12-03: 500 mg via INTRAVENOUS
  Filled 2024-12-03: qty 100

## 2024-12-03 MED ORDER — VANCOMYCIN HCL 2000 MG/400ML IV SOLN
2000.0000 mg | Freq: Once | INTRAVENOUS | Status: AC
  Administered 2024-12-03: 2000 mg via INTRAVENOUS
  Filled 2024-12-03: qty 400

## 2024-12-03 MED ORDER — IOHEXOL 350 MG/ML SOLN
75.0000 mL | Freq: Once | INTRAVENOUS | Status: AC | PRN
  Administered 2024-12-03: 75 mL via INTRAVENOUS

## 2024-12-03 MED ORDER — LACTATED RINGERS IV SOLN: INTRAVENOUS | Status: DC

## 2024-12-03 MED ORDER — VANCOMYCIN HCL IN DEXTROSE 1-5 GM/200ML-% IV SOLN
1000.0000 mg | Freq: Once | INTRAVENOUS | Status: DC
  Filled 2024-12-03: qty 200

## 2024-12-03 MED ORDER — LACTATED RINGERS IV BOLUS (SEPSIS)
500.0000 mL | Freq: Once | INTRAVENOUS | Status: AC
  Administered 2024-12-03: 500 mL via INTRAVENOUS

## 2024-12-03 NOTE — ED Triage Notes (Signed)
 Pt BIB EMS from [facility]. Called out for possible sepsis, HR 110-130, RR 40, cbg 420,capnography 35-45. Per EMS hx of septic shock, and TBI.  Facility staff reports pt is at his baseline and admin 20units of lantus  2050. Pt had arrives with feeding tube and foley catheter. Wears 3L [place] baseline.  20g L forearm, 300ml LR admin by EMS

## 2024-12-03 NOTE — ED Provider Notes (Signed)
 [place] EMERGENCY DEPARTMENT AT [facility] Provider Note   CSN: [id number] Arrival date & time: 12/03/24  2201     Patient presents with: Code Sepsis   Joshua Haley is a 73 y.o. male.  {Add pertinent medical, surgical, social history, OB history to HPI:32947} 73 yo male presents with EMS from [facility] for sepsis, HR elevated, tachyphenic, reportedly at baseline. Fever 103.1 rectal on arrival, HR 120, tachypenic at 30, O2 at 100%.        Prior to Admission medications   Medication Sig Start Date End Date Taking? Authorizing Provider  acetaminophen  (TYLENOL ) 160 MG/5ML solution Place 20.3 mLs (650 mg total) into feeding tube every 6 (six) hours as needed for fever. 10/17/24   Sherrill Cable Latif, DO  amantadine  (SYMMETREL ) 50 MG/5ML solution Place 100 mg into feeding tube daily.    [provider]  Amino Acids-Protein Hydrolys (PRO-STAT AWC) LIQD Place 30 mLs into feeding tube daily.    [provider]  artificial tears ophthalmic solution Place 2 drops into both eyes as needed for dry eyes. 10/17/24   Sherrill Cable Latif, DO  ascorbic acid  (VITAMIN C ) 500 MG tablet Place 500 mg into feeding tube daily. Give 500mg  per tube daily    [provider]  Bempedoic Acid (NEXLETOL) 180 MG TABS Take 180 mg by mouth daily at 12 noon.    [provider]  cetirizine (ZYRTEC) 10 MG tablet Take 10 mg by mouth daily.    [provider]  docusate sodium  (COLACE) 100 MG capsule Take 1 capsule (100 mg total) by mouth 2 (two) times daily as needed for mild constipation. 10/17/24   Sherrill Cable Latif, DO  enoxaparin  (LOVENOX ) 40 MG/0.4ML injection Inject 40 mg into the skin daily.    [provider]  ferrous sulfate  300 (60 Fe) MG/5ML syrup Place 300 mg into feeding tube 2 (two) times daily. Patient not taking: Reported on 10/13/2024    [provider]  furosemide  (LASIX ) 20 MG tablet Place 1 tablet (20 mg total) into feeding  tube daily. 10/17/24   Sheikh, Cable Latif, DO  gatifloxacin  (ZYMAXID ) 0.5 % SOLN Place 1 drop into both eyes 4 (four) times daily. 10/17/24   Sherrill Cable Latif, DO  insulin  aspart (NOVOLOG ) 100 UNIT/ML injection insulin  aspart (novoLOG ) injection 0-15 Units 0-15 Units, Subcutaneous, Every 4 hours,  CBG < 70: Implement Hypoglycemia Standing Orders and refer to Hypoglycemia Standing Orders sidebar report CBG 70 - 120: 0 units CBG 121 - 150: 2 units CBG 151 - 200: 3 units CBG 201 - 250: 5 units CBG 251 - 300: 8 units CBG 301 - 350: 11 units CBG 351 - 400: 15 units CBG > 400: call MD Patient taking differently: Inject 0-21 Units into the skin in the morning, at noon, in the evening, and at bedtime. Sliding scale insulin : Blood sugar is <60 or >500, notify MD. Blood sugar is 121-150= 2 units  Blood sugar is 151-200= 3 units Blood sugar is 201-250= 5 units Blood sugar is 251-300= 8 units Blood sugar is 301-350= 11 units Blood sugar is 351-400= 15 units Blood sugar is 401-450= 18 units Blood sugar is 451-500= 21 units 09/25/22   Ghimire, Donalda HERO, MD  insulin  glargine (LANTUS ) 100 UNIT/ML injection Inject 0.25 mLs (25 Units total) into the skin daily. Patient taking differently: Inject 54 Units into the skin daily. 09/25/22   Ghimire, Donalda HERO, MD  lansoprazole (PREVACID) 30 MG capsule Give 1 capsule (30mg )  per G tube daily    [provider]  leptospermum manuka honey (MEDIHONEY) PSTE paste Apply 1 Application topically daily. Patient not taking: Reported on 10/13/2024 12/31/22   Lemon Raisin, MD  levETIRAcetam  (KEPPRA ) 500 MG tablet Place 1 tablet (500 mg total) into feeding tube 2 (two) times daily. 01/27/23   Nguyen, Quan, DO  levothyroxine  (SYNTHROID ) 100 MCG tablet Place 100 mcg into feeding tube daily. Give 100mcg per tube daily    [provider]  liothyronine  (CYTOMEL ) 5 MCG tablet Place 7.5 mcg into feeding tube every 12 (twelve) hours. Give one and one half tablet (7.5mg )  per tube every 12 hours    [provider]  Methylcellulose, Laxative, (CITRUCEL) 500 MG TABS Place 1 tablet (500 mg total) into feeding tube 3 (three) times daily as needed (Constipation). 10/17/24   Sheikh, Omair Latif, DO  metoprolol  tartrate (LOPRESSOR ) 25 MG tablet Place 12.5 mg into feeding tube 2 (two) times daily.    [provider]  midodrine  (PROAMATINE ) 10 MG tablet Place 1 tablet (10 mg total) into feeding tube 3 (three) times daily with meals. 10/17/24   Sherrill Cable Latif, DO  modafinil  (PROVIGIL ) 100 MG tablet GIVE 1 TAB VIA TUBE ONCE DAILY 11/03/24   Fleeta Finger, Selinda, MD  Mouthwashes (MOUTH RINSE) LIQD solution 15 mLs by Mouth Rinse route as needed (oral care). 10/17/24   Sheikh, Cable Latif, DO  Mouthwashes (MOUTH RINSE) LIQD solution 15 mLs by Mouth Rinse route 4 (four) times daily. 10/17/24   Sheikh, Omair Latif, DO  Multiple Vitamin (MULTIVITAMIN) LIQD Place 15 mLs into feeding tube daily. Patient taking differently: Place into feeding tube daily. Give 1 tablet 12/31/22   Lemon Raisin, MD  mupirocin  ointment (BACTROBAN ) 2 % Place 1 Application into the nose 2 (two) times daily. 10/17/24   Sheikh, Omair Latif, DO  Nutritional Supplements (ARGINAID) PACK Place 1 packet into feeding tube 2 (two) times daily. Arginaid 4.5gram-156mg /9.2 gram oral powder packet Patient not taking: Reported on 10/13/2024    [provider]  Nutritional Supplements (FEEDING SUPPLEMENT, GLUCERNA 1.5 CAL,) LIQD Place 60 mL/hr into feeding tube See admin instructions. Give 67mL/hr per tube by shift    [provider]  Nystatin (GERHARDT'S BUTT CREAM) CREA Apply 1 Application topically 2 (two) times daily. 10/17/24   Sheikh, Omair Latif, DO  omega-3 acid ethyl esters (LOVAZA ) 1 g capsule Place 1 g into feeding tube daily. Give 1 g per tube daily    [provider]  ondansetron  (ZOFRAN -ODT) 4 MG disintegrating tablet 4 mg every 6 (six) hours as needed for nausea or  vomiting. Per tube    [provider]  polyethylene glycol (MIRALAX  / GLYCOLAX ) 17 g packet Give 17g per tube daily as needed for constipation    [provider]  sertraline  (ZOLOFT ) 25 MG tablet Place 25 mg into feeding tube daily. Give 25mg  per tube daily    [provider]  sodium chloride  HYPERTONIC 3 % nebulizer solution Take 4 mLs by nebulization 2 (two) times daily. Patient not taking: Reported on 01/18/2023 12/30/22   Lemon Raisin, MD  sodium chloride  irrigation 0.9 % irrigation Irrigate with 60 mLs as directed every 8 (eight) hours. Irrigate indwelling foley with 60mL sterile saline every 8 hours.    [provider]  Water  For Irrigation, Sterile (FREE WATER ) SOLN Place 150 mLs into feeding tube every 4 (four) hours. 10/17/24   Sheikh, Omair Latif, DO    Allergies: Morphine and codeine, Penicillins,  and Statins    Review of Systems Level 5 caveat for nonverbal patient  Updated Vital Signs BP 135/81   Pulse (!) 120   Temp (!) 103.1 F (39.5 C) (Rectal)   Resp (!) 30   SpO2 100%   Physical Exam Vitals and nursing note reviewed.  Constitutional:      General: He is not in acute distress.    Appearance: He is well-developed. He is not diaphoretic.  HENT:     Head: Normocephalic and atraumatic.     Mouth/Throat:     Mouth: Mucous membranes are dry.  Cardiovascular:     Rate and Rhythm: Regular rhythm. Tachycardia present.  Pulmonary:     Breath sounds: Examination of the right-upper field reveals rhonchi. Examination of the left-upper field reveals rhonchi. Examination of the right-middle field reveals decreased breath sounds. Examination of the left-middle field reveals decreased breath sounds. Examination of the right-lower field reveals decreased breath sounds. Examination of the left-lower field reveals decreased breath sounds. Decreased breath sounds and rhonchi present.     Comments: Tracheostomy  Abdominal:     Palpations: Abdomen is  soft.     Tenderness: There is no abdominal tenderness.      Comments: Indwelling urethral catheter   Musculoskeletal:     Right lower leg: No edema.     Left lower leg: No edema.  Skin:    General: Skin is warm and dry.     Findings: No erythema or rash.  Neurological:     Mental Status: He is alert.  Psychiatric:        Behavior: Behavior normal.     (all labs ordered are listed, but only abnormal results are displayed) Labs Reviewed  CBG MONITORING, ED - Abnormal; Notable for the following components:      Result Value   Glucose-Capillary 307 (*)    All other components within normal limits  CULTURE, BLOOD (ROUTINE X 2)  CULTURE, BLOOD (ROUTINE X 2)  RESP PANEL BY RT-PCR (RSV, FLU A&B, COVID)  RVPGX2  COMPREHENSIVE METABOLIC PANEL WITH GFR  CBC WITH DIFFERENTIAL/PLATELET  PROTIME-INR  URINALYSIS, W/ REFLEX TO CULTURE (INFECTION SUSPECTED)  I-STAT CG4 LACTIC ACID, ED  I-STAT CHEM 8, ED    EKG: EKG Interpretation Date/Time:  Sunday December 03 2024 22:07:56 EST Ventricular Rate:  121 PR Interval:  143 QRS Duration:  86 QT Interval:  317 QTC Calculation: 450 R Axis:   58  Text Interpretation: Sinus tachycardia when compared to prior, similar appearance with less artifact No STEMI Confirmed by Ginger Barefoot ([id number]) on 12/03/2024 10:11:19 PM  Radiology: No results found.  {Document cardiac monitor, telemetry assessment procedure when appropriate:32947} Procedures   Medications Ordered in the ED  lactated ringers  infusion (has no administration in time range)  lactated ringers  bolus 1,000 mL (has no administration in time range)    And  lactated ringers  bolus 1,000 mL (has no administration in time range)    And  lactated ringers  bolus 500 mL (has no administration in time range)  ceFEPIme  (MAXIPIME ) 2 g in sodium chloride  0.9 % 100 mL IVPB (has no administration in time range)  metroNIDAZOLE  (FLAGYL ) IVPB 500 mg (has no administration in time range)   vancomycin  (VANCOCIN ) IVPB 1000 mg/200 mL premix (has no administration in time range)  acetaminophen  (TYLENOL ) suppository 650 mg (has no administration in time range)      {Click here for ABCD2, HEART and other calculators REFRESH Note before signing:1}  Medical Decision Making Amount and/or Complexity of Data Reviewed Labs: ordered. Radiology: ordered.  Risk OTC drugs. Prescription drug management. Decision regarding hospitalization.   This patient presents to the ED for concern of ***, this involves an extensive number of treatment options, and is a complaint that carries with it a high risk of complications and morbidity.  The differential diagnosis includes ***   Co morbidities / Chronic conditions that complicate the patient evaluation  ***   Additional history obtained:  Additional history obtained from EMR External records from outside source obtained and reviewed including ***   Lab Tests:  I Ordered, and personally interpreted labs.  The pertinent results include:  ***   Imaging Studies ordered:  I ordered imaging studies including CXR, CT C/A/P  I independently visualized and interpreted imaging which showed *** I agree with the radiologist interpretation   Cardiac Monitoring: / EKG:  The patient was maintained on a cardiac monitor.  I personally viewed and interpreted the cardiac monitored which showed an underlying rhythm of: ***   Problem List / ED Course / Critical interventions / Medication management  *** I ordered medication including ***   Reevaluation of the patient after these medicines showed that the patient *** I have reviewed the patients home medicines and have made adjustments as needed   Consultations Obtained:  I requested consultation with the ***,  and discussed lab and imaging findings as well as pertinent plan - they recommend: *** Call to pharmacy, [facility], facility documentation lists cefepime   as allergy. Hospital records show patient has had cefepime  multiple times without documented reaction. Plan is to monitor, can change to aztreonam if needed.    Social Determinants of Health:  Lives at facility    Test / Admission - Considered:  admit   {Document critical care time when appropriate  Document review of labs and clinical decision tools ie CHADS2VASC2, etc  Document your independent review of radiology images and any outside records  Document your discussion with family members, caretakers and with consultants  Document social determinants of health affecting pt's care  Document your decision making why or why not admission, treatments were needed:32947:::1}   Final diagnoses:  None    ED Discharge Orders     None

## 2024-12-03 NOTE — ED Notes (Signed)
 Patient transported to CT

## 2024-12-04 ENCOUNTER — Encounter (HOSPITAL_COMMUNITY): Payer: Self-pay | Admitting: Family Medicine

## 2024-12-04 DIAGNOSIS — L89220 Pressure ulcer of left hip, unstageable: Secondary | ICD-10-CM | POA: Diagnosis present

## 2024-12-04 DIAGNOSIS — J9621 Acute and chronic respiratory failure with hypoxia: Secondary | ICD-10-CM | POA: Diagnosis present

## 2024-12-04 DIAGNOSIS — E039 Hypothyroidism, unspecified: Secondary | ICD-10-CM

## 2024-12-04 DIAGNOSIS — L89154 Pressure ulcer of sacral region, stage 4: Secondary | ICD-10-CM | POA: Diagnosis present

## 2024-12-04 DIAGNOSIS — A4159 Other Gram-negative sepsis: Secondary | ICD-10-CM | POA: Diagnosis present

## 2024-12-04 DIAGNOSIS — I2510 Atherosclerotic heart disease of native coronary artery without angina pectoris: Secondary | ICD-10-CM | POA: Diagnosis present

## 2024-12-04 DIAGNOSIS — Z930 Tracheostomy status: Secondary | ICD-10-CM

## 2024-12-04 DIAGNOSIS — R6520 Severe sepsis without septic shock: Secondary | ICD-10-CM | POA: Diagnosis present

## 2024-12-04 DIAGNOSIS — J189 Pneumonia, unspecified organism: Secondary | ICD-10-CM | POA: Diagnosis present

## 2024-12-04 DIAGNOSIS — L8990 Pressure ulcer of unspecified site, unspecified stage: Secondary | ICD-10-CM

## 2024-12-04 DIAGNOSIS — E872 Acidosis, unspecified: Secondary | ICD-10-CM | POA: Diagnosis present

## 2024-12-04 DIAGNOSIS — Z1152 Encounter for screening for COVID-19: Secondary | ICD-10-CM | POA: Diagnosis not present

## 2024-12-04 DIAGNOSIS — Z794 Long term (current) use of insulin: Secondary | ICD-10-CM

## 2024-12-04 DIAGNOSIS — Z931 Gastrostomy status: Secondary | ICD-10-CM | POA: Diagnosis not present

## 2024-12-04 DIAGNOSIS — E870 Hyperosmolality and hypernatremia: Secondary | ICD-10-CM

## 2024-12-04 DIAGNOSIS — A4151 Sepsis due to Escherichia coli [E. coli]: Secondary | ICD-10-CM | POA: Diagnosis not present

## 2024-12-04 DIAGNOSIS — E119 Type 2 diabetes mellitus without complications: Secondary | ICD-10-CM | POA: Diagnosis not present

## 2024-12-04 DIAGNOSIS — E440 Moderate protein-calorie malnutrition: Secondary | ICD-10-CM | POA: Diagnosis present

## 2024-12-04 DIAGNOSIS — I10 Essential (primary) hypertension: Secondary | ICD-10-CM | POA: Diagnosis present

## 2024-12-04 DIAGNOSIS — J690 Pneumonitis due to inhalation of food and vomit: Secondary | ICD-10-CM

## 2024-12-04 DIAGNOSIS — Z7401 Bed confinement status: Secondary | ICD-10-CM | POA: Diagnosis not present

## 2024-12-04 DIAGNOSIS — Z79890 Hormone replacement therapy (postmenopausal): Secondary | ICD-10-CM | POA: Diagnosis not present

## 2024-12-04 DIAGNOSIS — E871 Hypo-osmolality and hyponatremia: Secondary | ICD-10-CM | POA: Diagnosis present

## 2024-12-04 DIAGNOSIS — D649 Anemia, unspecified: Secondary | ICD-10-CM | POA: Diagnosis present

## 2024-12-04 DIAGNOSIS — Z66 Do not resuscitate: Secondary | ICD-10-CM | POA: Diagnosis present

## 2024-12-04 DIAGNOSIS — E1165 Type 2 diabetes mellitus with hyperglycemia: Secondary | ICD-10-CM | POA: Diagnosis present

## 2024-12-04 DIAGNOSIS — A419 Sepsis, unspecified organism: Secondary | ICD-10-CM | POA: Diagnosis not present

## 2024-12-04 DIAGNOSIS — L89620 Pressure ulcer of left heel, unstageable: Secondary | ICD-10-CM | POA: Diagnosis present

## 2024-12-04 LAB — CBC
HCT: 30.9 % — ABNORMAL LOW (ref 39.0–52.0)
Hemoglobin: 8.9 g/dL — ABNORMAL LOW (ref 13.0–17.0)
MCH: 26.9 pg (ref 26.0–34.0)
MCHC: 28.8 g/dL — ABNORMAL LOW (ref 30.0–36.0)
MCV: 93.4 fL (ref 80.0–100.0)
Platelets: 341 K/uL (ref 150–400)
RBC: 3.31 MIL/uL — ABNORMAL LOW (ref 4.22–5.81)
RDW: 19.1 % — ABNORMAL HIGH (ref 11.5–15.5)
WBC: 10.7 K/uL — ABNORMAL HIGH (ref 4.0–10.5)
nRBC: 0.2 % (ref 0.0–0.2)

## 2024-12-04 LAB — CBG MONITORING, ED
Glucose-Capillary: 333 mg/dL — ABNORMAL HIGH (ref 70–99)
Glucose-Capillary: 249 mg/dL — ABNORMAL HIGH (ref 70–99)
Glucose-Capillary: 201 mg/dL — ABNORMAL HIGH (ref 70–99)
Glucose-Capillary: 205 mg/dL — ABNORMAL HIGH (ref 70–99)
Glucose-Capillary: 172 mg/dL — ABNORMAL HIGH (ref 70–99)

## 2024-12-04 LAB — RESP PANEL BY RT-PCR (RSV, FLU A&B, COVID)  RVPGX2
Influenza A by PCR: NEGATIVE
Influenza B by PCR: NEGATIVE
Resp Syncytial Virus by PCR: NEGATIVE
SARS Coronavirus 2 by RT PCR: NEGATIVE

## 2024-12-04 LAB — BASIC METABOLIC PANEL WITH GFR
Anion gap: 11 (ref 5–15)
BUN: 43 mg/dL — ABNORMAL HIGH (ref 8–23)
CO2: 32 mmol/L (ref 22–32)
Calcium: 8.9 mg/dL (ref 8.9–10.3)
Chloride: 106 mmol/L (ref 98–111)
Creatinine, Ser: 0.83 mg/dL (ref 0.61–1.24)
GFR, Estimated: >60 mL/min (ref >60)
Glucose, Bld: 362 mg/dL — ABNORMAL HIGH (ref 70–99)
Potassium: 4 mmol/L (ref 3.5–5.1)
Sodium: 149 mmol/L — ABNORMAL HIGH (ref 135–145)

## 2024-12-04 LAB — I-STAT CG4 LACTIC ACID, ED
Lactic Acid, Venous: 1.3 mmol/L (ref 0.5–1.9)
Lactic Acid, Venous: 3.3 mmol/L (ref 0.5–1.9)

## 2024-12-04 LAB — GLUCOSE, CAPILLARY: Glucose-Capillary: 170 mg/dL — ABNORMAL HIGH (ref 70–99)

## 2024-12-04 MED ORDER — INSULIN GLARGINE 100 UNIT/ML ~~LOC~~ SOLN
15.0000 [IU] | Freq: Every day | SUBCUTANEOUS | Status: DC
  Administered 2024-12-04 – 2024-12-07 (×4): 15 [IU] via SUBCUTANEOUS
  Filled 2024-12-04 (×5): qty 0.15

## 2024-12-04 MED ORDER — INSULIN GLARGINE-YFGN 100 UNIT/ML ~~LOC~~ SOPN
25.0000 [IU] | PEN_INJECTOR | Freq: Every day | SUBCUTANEOUS | Status: DC
  Filled 2024-12-04: qty 3

## 2024-12-04 MED ORDER — POLYETHYLENE GLYCOL 3350 17 G PO PACK
17.0000 g | PACK | Freq: Two times a day (BID) | ORAL | Status: DC
  Administered 2024-12-04 – 2024-12-08 (×3): 17 g
  Filled 2024-12-04 (×3): qty 1

## 2024-12-04 MED ORDER — ONDANSETRON HCL 4 MG/2ML IJ SOLN: 4.0000 mg | Freq: Four times a day (QID) | INTRAMUSCULAR | Status: DC | PRN

## 2024-12-04 MED ORDER — INSULIN ASPART 100 UNIT/ML IJ SOLN
0.0000 [IU] | INTRAMUSCULAR | Status: DC
  Administered 2024-12-04: 1 [IU] via SUBCUTANEOUS
  Administered 2024-12-04: 4 [IU] via SUBCUTANEOUS
  Administered 2024-12-04 – 2024-12-05 (×4): 2 [IU] via SUBCUTANEOUS
  Administered 2024-12-05: 3 [IU] via SUBCUTANEOUS
  Administered 2024-12-05: 1 [IU] via SUBCUTANEOUS
  Administered 2024-12-05 – 2024-12-06 (×2): 3 [IU] via SUBCUTANEOUS
  Administered 2024-12-06: 2 [IU] via SUBCUTANEOUS
  Administered 2024-12-06: 4 [IU] via SUBCUTANEOUS
  Administered 2024-12-06 (×2): 3 [IU] via SUBCUTANEOUS
  Administered 2024-12-06 – 2024-12-08 (×6): 1 [IU] via SUBCUTANEOUS
  Filled 2024-12-04: qty 3
  Filled 2024-12-04: qty 4
  Filled 2024-12-04: qty 3
  Filled 2024-12-04: qty 4
  Filled 2024-12-04: qty 2
  Filled 2024-12-04: qty 1
  Filled 2024-12-04 (×2): qty 3
  Filled 2024-12-04: qty 4
  Filled 2024-12-04 (×2): qty 1
  Filled 2024-12-04: qty 2
  Filled 2024-12-04: qty 4
  Filled 2024-12-04: qty 2
  Filled 2024-12-04: qty 4
  Filled 2024-12-04: qty 1
  Filled 2024-12-04: qty 2
  Filled 2024-12-04: qty 1
  Filled 2024-12-04: qty 2
  Filled 2024-12-04: qty 1

## 2024-12-04 MED ORDER — INSULIN GLARGINE 100 UNIT/ML ~~LOC~~ SOLN: 25.0000 [IU] | Freq: Every day | SUBCUTANEOUS | Status: DC

## 2024-12-04 MED ORDER — ONDANSETRON HCL 4 MG PO TABS: 4.0000 mg | ORAL_TABLET | Freq: Four times a day (QID) | ORAL | Status: DC | PRN

## 2024-12-04 MED ORDER — LEVOTHYROXINE SODIUM 100 MCG PO TABS
100.0000 ug | ORAL_TABLET | Freq: Every day | ORAL | Status: DC
  Administered 2024-12-04 – 2024-12-08 (×5): 100 ug
  Filled 2024-12-04 (×5): qty 1

## 2024-12-04 MED ORDER — LEVETIRACETAM 500 MG PO TABS
500.0000 mg | ORAL_TABLET | Freq: Two times a day (BID) | ORAL | Status: DC
  Administered 2024-12-04 – 2024-12-08 (×10): 500 mg
  Filled 2024-12-04 (×10): qty 1

## 2024-12-04 MED ORDER — SODIUM CHLORIDE 0.9 % IV SOLN
1.0000 g | Freq: Three times a day (TID) | INTRAVENOUS | Status: DC
  Administered 2024-12-04 – 2024-12-08 (×14): 1 g via INTRAVENOUS
  Filled 2024-12-04 (×15): qty 20

## 2024-12-04 MED ORDER — LIOTHYRONINE SODIUM 5 MCG PO TABS
7.5000 ug | ORAL_TABLET | Freq: Two times a day (BID) | ORAL | Status: DC
  Administered 2024-12-04 – 2024-12-08 (×10): 7.5 ug
  Filled 2024-12-04 (×11): qty 2

## 2024-12-04 MED ORDER — MIDODRINE HCL 5 MG PO TABS
10.0000 mg | ORAL_TABLET | Freq: Three times a day (TID) | ORAL | Status: DC
  Administered 2024-12-04 – 2024-12-08 (×14): 10 mg
  Filled 2024-12-04 (×14): qty 2

## 2024-12-04 MED ORDER — SERTRALINE HCL 25 MG PO TABS
25.0000 mg | ORAL_TABLET | Freq: Every day | ORAL | Status: DC
  Administered 2024-12-04 – 2024-12-08 (×5): 25 mg
  Filled 2024-12-04 (×5): qty 1

## 2024-12-04 MED ORDER — ENOXAPARIN SODIUM 40 MG/0.4ML IJ SOSY
40.0000 mg | PREFILLED_SYRINGE | INTRAMUSCULAR | Status: DC
  Administered 2024-12-04 – 2024-12-08 (×5): 40 mg via SUBCUTANEOUS
  Filled 2024-12-04 (×5): qty 0.4

## 2024-12-04 MED ORDER — CHLORHEXIDINE GLUCONATE CLOTH 2 % EX PADS
6.0000 | MEDICATED_PAD | Freq: Every day | CUTANEOUS | Status: DC
  Administered 2024-12-04 – 2024-12-08 (×6): 6 via TOPICAL

## 2024-12-04 MED ORDER — SODIUM CHLORIDE 0.45 % IV SOLN: INTRAVENOUS | Status: AC

## 2024-12-04 MED ORDER — SODIUM CHLORIDE 0.9% FLUSH
3.0000 mL | Freq: Two times a day (BID) | INTRAVENOUS | Status: DC
  Administered 2024-12-04 – 2024-12-07 (×8): 3 mL via INTRAVENOUS

## 2024-12-04 MED ORDER — OMEPRAZOLE 2 MG/ML ORAL SUSPENSION
40.0000 mg | Freq: Every day | ORAL | Status: DC
  Administered 2024-12-04 – 2024-12-08 (×5): 40 mg
  Filled 2024-12-04 (×5): qty 20

## 2024-12-04 MED ORDER — MODAFINIL 100 MG PO TABS
100.0000 mg | ORAL_TABLET | Freq: Every day | ORAL | Status: DC
  Administered 2024-12-05 – 2024-12-08 (×4): 100 mg
  Filled 2024-12-04 (×4): qty 1

## 2024-12-04 MED ORDER — ACETAMINOPHEN 325 MG PO TABS
650.0000 mg | ORAL_TABLET | Freq: Four times a day (QID) | ORAL | Status: DC | PRN
  Administered 2024-12-04 – 2024-12-06 (×2): 650 mg
  Filled 2024-12-04 (×2): qty 2

## 2024-12-04 MED ORDER — AMANTADINE HCL 50 MG/5ML PO SOLN
100.0000 mg | Freq: Every day | ORAL | Status: DC
  Administered 2024-12-04 – 2024-12-08 (×5): 100 mg
  Filled 2024-12-04 (×5): qty 10

## 2024-12-04 MED ORDER — GLUCERNA 1.5 CAL PO LIQD
1000.0000 mL | ORAL | Status: DC
  Administered 2024-12-04 – 2024-12-08 (×4): 1000 mL
  Filled 2024-12-04 (×8): qty 1000

## 2024-12-04 MED ORDER — METRONIDAZOLE 500 MG/100ML IV SOLN: 500.0000 mg | Freq: Two times a day (BID) | INTRAVENOUS | Status: DC

## 2024-12-04 MED ORDER — VANCOMYCIN HCL 1500 MG/300ML IV SOLN: 1500.0000 mg | INTRAVENOUS | Status: DC

## 2024-12-04 MED ORDER — VANCOMYCIN HCL IN DEXTROSE 1-5 GM/200ML-% IV SOLN
1000.0000 mg | Freq: Two times a day (BID) | INTRAVENOUS | Status: DC
  Administered 2024-12-04 – 2024-12-06 (×5): 1000 mg via INTRAVENOUS
  Filled 2024-12-04 (×5): qty 200

## 2024-12-04 MED ORDER — GATIFLOXACIN 0.5 % OP SOLN
1.0000 [drp] | Freq: Four times a day (QID) | OPHTHALMIC | Status: DC
  Administered 2024-12-04 – 2024-12-08 (×14): 1 [drp] via OPHTHALMIC
  Filled 2024-12-04: qty 2.5

## 2024-12-04 MED ORDER — GLUCERNA 1.5 CAL PO LIQD: 60.0000 mL/h | ORAL | Status: DC

## 2024-12-04 MED ORDER — MINERAL OIL RE ENEM
1.0000 | ENEMA | Freq: Once | RECTAL | Status: AC
  Administered 2024-12-04: 1 via RECTAL
  Filled 2024-12-04: qty 1

## 2024-12-04 MED ORDER — MODAFINIL 100 MG PO TABS
100.0000 mg | ORAL_TABLET | Freq: Once | ORAL | Status: AC
  Administered 2024-12-04: 100 mg via ORAL
  Filled 2024-12-04: qty 1

## 2024-12-04 NOTE — ED Notes (Signed)
 Contacted ccmd to be put on the monitor.

## 2024-12-04 NOTE — ED Notes (Signed)
 [name] Huneke (Wife) is requesting an update on patients status [phone]

## 2024-12-04 NOTE — ED Notes (Signed)
 [name] Knotts is requesting an update (wife) [phone]

## 2024-12-04 NOTE — ED Notes (Signed)
 [facility], the patient's wife is requesting an update. She can be reached at [phone].

## 2024-12-04 NOTE — Progress Notes (Signed)
   Brief Progress Note   _____________________________________________________________________________________________________________  Patient Name: Joshua Haley Patient DOB: 1951/11/26 Date: @TODAY @      Data: Reviewed labs, notes, and vital signs.    Action: No action at this time.    Response:    _____________________________________________________________________________________________________________  [facility] RN Expeditor Loney Domingo S Brooklynne Pereida Please contact us  directly via secure chat (search for [facility]) or by calling us  at [phone] [facility]).

## 2024-12-04 NOTE — ED Notes (Signed)
 PT cleaned up, brief changed. Mepalex replaced. Area cleaned. PT moved from ER stretcher to an inpatient bed. Heels floated.  Breathing is even and unlabored.  Pt on 2L nasal cannula at this time.

## 2024-12-04 NOTE — ED Notes (Signed)
Pillow placed under patients left side

## 2024-12-04 NOTE — Progress Notes (Signed)
 PHARMACY NOTE:  ANTIMICROBIAL RENAL DOSAGE ADJUSTMENT  Current antimicrobial regimen includes a mismatch between antimicrobial dosage and estimated renal function.  As per policy approved by the Pharmacy & Therapeutics and Medical Executive Committees, the antimicrobial dosage will be adjusted accordingly.  Current antimicrobial dosage:  vancomycin  1500mg  q24h (eAUC 325)  Indication: sepsis  Renal Function:  Estimated Creatinine Clearance: 88.3 mL/min (by C-G formula based on SCr of 0.83 mg/dL). []      On intermittent HD, scheduled: []      On CRRT    Antimicrobial dosage has been changed to:  vancomycin  1000mg  q12h (eAUC 433, Scr 0.8)  Additional comments:   Thank you for allowing pharmacy to be a part of this patient's care.  Leonor GORMAN Bash, [facility] 12/04/2024 9:44 AM

## 2024-12-04 NOTE — Progress Notes (Signed)
 SLP Cancellation Note  Patient Details Name: Joshua Haley MRN: [id number] DOB: 1951/08/21   Cancelled treatment:       Reason Eval/Treat Not Completed: Fatigue/lethargy limiting ability to participate. Pt not alert enough to attempt swallow eval today. Will f/u for potential to evaluate on subsequent date.   SLP did speak with pt's wife, [name], to obtain further history and PLOF. She reports that pt's initial TBI was 12/03/2021, and that initially he was eating and communicating after this accident. He had a regression ~6 months later, and it was at that time that he stopped speaking and eating. SLP had been stopped at [facility] because he had not been making progress. PMV had been attempted, but he would pull it off, either resulting in lost valves or sometimes being found in his mouth. At his baseline, [name] says he will vocalize but not verbalize, tracks, waves, and inconsistently uses familiar objects. He has been sick so much recently that he has been increasingly lethargic and sleeping most of the time though.   [name] understandably has safety concerns about using the PMV, particularly without supervision. If he were to become alert enough to be near his baseline, she is interested in seeing if he would participate at least in a swallowing evaluation. Will follow acutely at least to see if he reaches this point during this admission, or if it would be better suited at next level of care.     Leita SAILOR., M.A. CCC-SLP Acute Rehabilitation Services Office: [phone]  Secure chat preferred  12/04/2024, 12:53 PM

## 2024-12-04 NOTE — Progress Notes (Signed)
 Pharmacy Antibiotic Note  Joshua Haley is a 73 y.o. male admitted on 12/03/2024 with Fever.  Pharmacy has been consulted for merrem /vancomycin  dosing for sepsis concerns.  -10/17 BCID: ESBL E. Coli >> treated with merrem  to Bactrim  (source Urine) -blood cultures collected -Tmax 103.1, WBC 14, sCr 1.1 (bl~0.6), MRSA PCR 10/17 + -CXR/CT: pneumonia   Plan: -Merrem  1g IV every 8 hours -Vancomycin  2g IV x1 -Vancomycin  1500mg  IV every 24 hours (AUC 423, Vd 0.72, IBW, sCr 1.1) -Monitor renal function -Follow up signs of clinical improvement, LOT, de-escalation of antibiotics   Height: 6' (182.9 cm) Weight: 82.4 kg (181 lb 10.5 oz) (wt from 10/17/2024) IBW/kg (Calculated) : 77.6  Temp (24hrs), Avg:101.1 F (38.4 C), Min:99 F (37.2 C), Max:103.1 F (39.5 C)  Recent Labs  Lab 12/03/24 2215 12/03/24 2226 12/03/24 2231 12/04/24 0044  WBC 14.4*  --   --   --   CREATININE 1.00 1.10  --   --   LATICACIDVEN  --   --  2.2* 3.3*    Estimated Creatinine Clearance: 66.6 mL/min (by C-G formula based on SCr of 1.1 mg/dL).    Allergies  Allergen Reactions   Morphine And Codeine Other (See Comments)    Not specified on MAR    Penicillins Other (See Comments)    Has tolerated Cefepime  and Ceftriaxone    Cefepime      Unknown reaction per [facility]   Statins Nausea And Vomiting    Antimicrobials this admission: Cefepime  x1 Merrem  12/8 >>  Vancomycin  12/7 >>   Microbiology results: 12/7 BCx:  12/7 UCx: 12/8 MRSA PCR: ordered  Thank you for allowing pharmacy to be a part of this patient's care.  Lynwood Poplar, PharmD, BCPS Clinical Pharmacist 12/04/2024 2:09 AM

## 2024-12-04 NOTE — H&P (Addendum)
 History and Physical    Joshua Haley FMW:[id number] DOB: 07/30/1951 DOA: 12/03/2024  PCP: Fleeta Valeria Mayo, MD   Patient coming from: [facility]   Chief Complaint: sepsis   HPI: Joshua Haley is a 73 y.o. male with medical history significant for TBI with aphasia and hemiparesis, tracheostomy and G-tube dependence, CAD, type 2 diabetes mellitus, chronic hypotension, history of ESBL infections, Pseudomonas in sputum, and chronic 1 L/min supplemental oxygen requirement who presents from [facility] with concerns for sepsis.  Patient was found to be febrile with tachycardia, tachypnea, and hyperglycemia at his facility.  EMS was called and the patient was treated with insulin  and 300 mL of LR prior to arrival in the ED.  Per the patient's wife, he had been vomiting at [facility] prior to this, is nonverbal at baseline, very weak on his right side, and contracted on the left.  ED Course: Upon arrival to the ED, patient is found to be febrile to 39.5 C and saturating mid 90s on 5 L/min supplemental oxygen with tachypnea, tachycardia, and SBP in the low 100s.  Labs are most notable for glucose 375, sodium 149, albumin 2.3, WBC 14,400, and lactic acid 3.3.  CT demonstrates right lower lobe pneumonia, large stool burden, and unchanged pressure ulcers without acute osteomyelitis.  Blood and urine cultures were collected in the ED and the patient was given 2.5 L LR, acetaminophen , vancomycin , cefepime , and Flagyl .  Review of Systems:  Unable to complete ROS secondary to the patient's clinical condition.  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    CAD (coronary artery disease)    Diabetes mellitus without complication (HCC)    Hypertension    Hypothyroidism    Kidney stone    TBI (traumatic brain injury) (HCC)    Tracheostomy in place [facility])     Past Surgical History:  Procedure Laterality Date   CHOLECYSTECTOMY     CRANIECTOMY     JEJUNOSTOMY FEEDING TUBE      LEFT HEART CATH AND CORONARY ANGIOGRAPHY N/A 01/22/2020   Procedure: LEFT HEART CATH AND CORONARY ANGIOGRAPHY and possible PCI and stent;  Surgeon: Florencio Cara BIRCH, MD;  Location: [facility] INVASIVE CV LAB;  Service: Cardiovascular;  Laterality: N/A;   tracheostomy      Social History:   reports that he has quit smoking. He has never used smokeless tobacco. He reports that he does not currently use alcohol . He reports that he does not currently use drugs.  Allergies  Allergen Reactions   Morphine And Codeine Other (See Comments)    Not specified on MAR    Penicillins Other (See Comments)    Has tolerated Cefepime  and Ceftriaxone    Cefepime      Unknown reaction per [facility]   Statins Nausea And Vomiting    History reviewed. No pertinent family history.   Prior to Admission medications   Medication Sig Start Date End Date Taking? Authorizing Provider  acetaminophen  (TYLENOL ) 160 MG/5ML solution Place 20.3 mLs (650 mg total) into feeding tube every 6 (six) hours as needed for fever. 10/17/24  Yes Sheikh, Omair Latif, DO  amantadine  (SYMMETREL ) 50 MG/5ML solution Place 100 mg into feeding tube daily.   Yes [provider]  artificial tears ophthalmic solution Place 2 drops into both eyes as needed for dry eyes. 10/17/24  Yes Sheikh, Omair Latif, DO  ascorbic acid  (VITAMIN C ) 500 MG tablet Place 500 mg into feeding tube daily. Give 500mg  per tube daily  Yes [provider]  Bempedoic Acid (NEXLETOL) 180 MG TABS Take 180 mg by mouth in the morning.   Yes [provider]  cetirizine (ZYRTEC) 10 MG tablet Place 10 mg into feeding tube daily.   Yes [provider]  Dextrose , Diabetic Use, (DEX4 FAST ACTING GLUCOSE PO) Take 15 g by mouth as needed (for hypoglycemia).   Yes [provider]  docusate sodium  (COLACE) 100 MG capsule Take 1 capsule (100 mg total) by mouth 2 (two) times daily as needed for mild constipation. Patient taking differently: 100  mg 2 (two) times daily as needed for mild constipation. 100mg  per tube 10/17/24  Yes Sheikh, Omair Latif, DO  enoxaparin  (LOVENOX ) 40 MG/0.4ML injection Inject 40 mg into the skin daily.   Yes [provider]  ferrous sulfate  300 (60 Fe) MG/5ML syrup Place 300 mg into feeding tube 2 (two) times daily.   Yes [provider]  furosemide  (LASIX ) 20 MG tablet Place 1 tablet (20 mg total) into feeding tube daily. 10/17/24  Yes Sheikh, Omair Latif, DO  gatifloxacin  (ZYMAXID ) 0.5 % SOLN Place 1 drop into both eyes 4 (four) times daily. 10/17/24  Yes Sheikh, Omair Latif, DO  Glucagon (GVOKE HYPOPEN 1-PACK) 1 MG/0.2ML SOAJ Inject 1 mg into the skin as needed (for hypoglycemia).   Yes [provider]  glucagon Emergency 1 MG SOLR Inject 1 mg into the muscle as needed (for hypoglycemia).   Yes [provider]  insulin  aspart (FIASP  FLEXTOUCH) 100 UNIT/ML FlexTouch Pen Inject 0-15 Units into the skin every 6 (six) hours. Sliding scale insulin : 70-120= 0 units, 121-150= 2 units, 151-200= 3 units, 201-250= 5 units, 251-300= 8 units, 301-350= 11 units, 351-400= 15 units   Yes [provider]  insulin  glargine-yfgn (SEMGLEE ) 100 UNIT/ML Pen Inject 25 Units into the skin at bedtime.   Yes [provider]  lansoprazole (PREVACID SOLUTAB) 30 MG disintegrating tablet Place 30 mg into feeding tube daily at 12 noon.   Yes [provider]  levETIRAcetam  (KEPPRA ) 500 MG tablet Place 1 tablet (500 mg total) into feeding tube 2 (two) times daily. 01/27/23  Yes Nguyen, Quan, DO  levothyroxine  (SYNTHROID ) 100 MCG tablet Place 100 mcg into feeding tube daily. Give 100mcg per tube daily   Yes [provider]  liothyronine  (CYTOMEL ) 5 MCG tablet Place 7.5 mcg into feeding tube every 12 (twelve) hours. Give one and one half tablet (7.5mg ) per tube every 12 hours   Yes [provider]  Methylcellulose, Laxative, (CITRUCEL) 500 MG TABS Place 1 tablet (500 mg  total) into feeding tube 3 (three) times daily as needed (Constipation). 10/17/24  Yes Sheikh, Omair Latif, DO  midodrine  (PROAMATINE ) 10 MG tablet Place 1 tablet (10 mg total) into feeding tube 3 (three) times daily with meals. 10/17/24  Yes Sheikh, Omair Latif, DO  modafinil  (PROVIGIL ) 100 MG tablet GIVE 1 TAB VIA TUBE ONCE DAILY 11/03/24  Yes Fleeta Valeria Mayo, MD  Nutritional Supplements (FEEDING SUPPLEMENT, GLUCERNA 1.5 CAL,) LIQD Place 60 mL/hr into feeding tube See admin instructions. Give 32mL/hr per tube by shift   Yes [provider]  omega-3 acid ethyl esters (LOVAZA ) 1 g capsule Place 1 g into feeding tube daily. Give 1 g per tube daily   Yes [provider]  ondansetron  (ZOFRAN -ODT) 4 MG disintegrating tablet 4 mg every 6 (six) hours as needed for nausea or vomiting. Per tube   Yes [provider]  polyethylene glycol (MIRALAX  / GLYCOLAX ) 17  g packet Give 17g per tube daily as needed for constipation   Yes [provider]  sertraline  (ZOLOFT ) 25 MG tablet Place 25 mg into feeding tube daily. Give 25mg  per tube daily   Yes [provider]  sodium chloride  irrigation 0.9 % irrigation Irrigate with 60 mLs as directed every 8 (eight) hours. Irrigate indwelling foley with 60mL sterile saline every 8 hours.   Yes [provider]  Amino Acids-Protein Hydrolys (PRO-STAT AWC) LIQD Place 30 mLs into feeding tube daily. Patient not taking: Reported on 12/03/2024    [provider]  leptospermum manuka honey (MEDIHONEY) PSTE paste Apply 1 Application topically daily. Patient not taking: Reported on 10/13/2024 12/31/22   Lemon Raisin, MD  Mouthwashes (MOUTH RINSE) LIQD solution 15 mLs by Mouth Rinse route 4 (four) times daily. Patient not taking: Reported on 12/03/2024 10/17/24   Sherrill Cable Latif, DO  mupirocin  ointment (BACTROBAN ) 2 % Place 1 Application into the nose 2 (two) times daily. Patient not taking: Reported on 12/03/2024 10/17/24    Sheikh, Omair Latif, DO  Nystatin (GERHARDT'S BUTT CREAM) CREA Apply 1 Application topically 2 (two) times daily. Patient not taking: Reported on 12/03/2024 10/17/24   Sherrill Cable Latif, DO  Water  For Irrigation, Sterile (FREE WATER ) SOLN Place 150 mLs into feeding tube every 4 (four) hours. Patient not taking: Reported on 12/03/2024 10/17/24   Sherrill Cable Donovan, DO    Physical Exam: Vitals:   12/04/24 0045 12/04/24 0100 12/04/24 0115 12/04/24 0130  BP: (!) 109/59 (!) 115/59 (!) 110/58 112/60  Pulse: 99 100 98 (!) 101  Resp: (!) 21 (!) 24 (!) 22 20  Temp:      TempSrc:      SpO2: 98% 97% 95% 94%  Weight:      Height:         Constitutional: Not in acute distress, calm  Eyes: PERTLA, lids and conjunctivae normal ENMT: Mucous membranes are moist. Posterior pharynx clear of any exudate or lesions.   Neck: supple, no masses  Respiratory: coarse rhonchi on right. No wheezing.  Cardiovascular: Rate ~120 and regular. Trace lower extremity edema.  Abdomen: No tenderness, soft. Bowel sounds active.  Musculoskeletal: no clubbing / cyanosis. No joint deformity upper and lower extremities.   Skin: no significant rashes, lesions, ulcers. Warm, dry, well-perfused. Neurologic: No gross facial asymmetry. Aphasic. Contracted on left. Non-verbal.     Labs and Imaging on Admission: I have personally reviewed following labs and imaging studies  CBC: Recent Labs  Lab 12/03/24 2215 12/03/24 2226  WBC 14.4*  --   NEUTROABS 12.4*  --   HGB 10.5* 11.9*  HCT 38.1* 35.0*  MCV 94.5  --   PLT 396  --    Basic Metabolic Panel: Recent Labs  Lab 12/03/24 2215 12/03/24 2226  NA 149* 153*  K 4.1 4.1  CL 107 111  CO2 28  --   GLUCOSE 375* 392*  BUN 47* 51*  CREATININE 1.00 1.10  CALCIUM 9.3  --    GFR: Estimated Creatinine Clearance: 66.6 mL/min (by C-G formula based on SCr of 1.1 mg/dL). Liver Function Tests: Recent Labs  Lab 12/03/24 2215  AST 19  ALT 13  ALKPHOS 105  BILITOT  0.6  PROT 8.5*  ALBUMIN 2.3*   No results for input(s): LIPASE, AMYLASE in the last 168 hours. No results for input(s): AMMONIA in the last 168 hours. Coagulation Profile: Recent Labs  Lab 12/03/24 2215  INR 1.1   Cardiac Enzymes:  No results for input(s): CKTOTAL, CKMB, CKMBINDEX, TROPONINI in the last 168 hours. BNP (last 3 results) No results for input(s): PROBNP in the last 8760 hours. HbA1C: No results for input(s): HGBA1C in the last 72 hours. CBG: Recent Labs  Lab 12/03/24 2207  GLUCAP 307*   Lipid Profile: No results for input(s): CHOL, HDL, LDLCALC, TRIG, CHOLHDL, LDLDIRECT in the last 72 hours. Thyroid Function Tests: No results for input(s): TSH, T4TOTAL, FREET4, T3FREE, THYROIDAB in the last 72 hours. Anemia Panel: No results for input(s): VITAMINB12, FOLATE, FERRITIN, TIBC, IRON , RETICCTPCT in the last 72 hours. Urine analysis:    Component Value Date/Time   COLORURINE AMBER (A) 12/03/2024 2215   APPEARANCEUR CLOUDY (A) 12/03/2024 2215   LABSPEC 1.018 12/03/2024 2215   PHURINE 6.0 12/03/2024 2215   GLUCOSEU NEGATIVE 12/03/2024 2215   HGBUR SMALL (A) 12/03/2024 2215   BILIRUBINUR NEGATIVE 12/03/2024 2215   KETONESUR NEGATIVE 12/03/2024 2215   PROTEINUR 100 (A) 12/03/2024 2215   NITRITE NEGATIVE 12/03/2024 2215   LEUKOCYTESUR MODERATE (A) 12/03/2024 2215   Sepsis Labs: @LABRCNTIP (procalcitonin:4,lacticidven:4) ) Recent Results (from the past 240 hours)  Resp panel by RT-PCR (RSV, Flu A&B, Covid) Anterior Nasal Swab     Status: None   Collection Time: 12/03/24 10:19 PM   Specimen: Anterior Nasal Swab  Result Value Ref Range Status   SARS Coronavirus 2 by RT PCR NEGATIVE NEGATIVE Final   Influenza A by PCR NEGATIVE NEGATIVE Final   Influenza B by PCR NEGATIVE NEGATIVE Final    Comment: (NOTE) The Xpert Xpress SARS-CoV-2/FLU/RSV plus assay is intended as an aid in the diagnosis of influenza from  Nasopharyngeal swab specimens and should not be used as a sole basis for treatment. Nasal washings and aspirates are unacceptable for Xpert Xpress SARS-CoV-2/FLU/RSV testing.  Fact Sheet for Patients: bloggercourse.com  Fact Sheet for Healthcare Providers: seriousbroker.it  This test is not yet approved or cleared by the [place]  FDA and has been authorized for detection and/or diagnosis of SARS-CoV-2 by FDA under an Emergency Use Authorization (EUA). This EUA will remain in effect (meaning this test can be used) for the duration of the COVID-19 declaration under Section 564(b)(1) of the Act, 21 U.S.C. section 360bbb-3(b)(1), unless the authorization is terminated or revoked.     Resp Syncytial Virus by PCR NEGATIVE NEGATIVE Final    Comment: (NOTE) Fact Sheet for Patients: bloggercourse.com  Fact Sheet for Healthcare Providers: seriousbroker.it  This test is not yet approved or cleared by the [place]  FDA and has been authorized for detection and/or diagnosis of SARS-CoV-2 by FDA under an Emergency Use Authorization (EUA). This EUA will remain in effect (meaning this test can be used) for the duration of the COVID-19 declaration under Section 564(b)(1) of the Act, 21 U.S.C. section 360bbb-3(b)(1), unless the authorization is terminated or revoked.  Performed at [facility], [address] [place], [place] [address]      Radiological Exams on Admission: CT CHEST ABDOMEN PELVIS W CONTRAST Result Date: 12/03/2024 EXAM: CT CHEST, ABDOMEN AND PELVIS WITH CONTRAST 12/03/2024 11:12:53 PM TECHNIQUE: CT of the chest, abdomen and pelvis was performed with the administration of intravenous contrast. Multiplanar reformatted images are provided for review. Automated exposure control, iterative reconstruction, and/or weight based adjustment of the mA/kV was utilized to  reduce the radiation dose to as low as reasonably achievable. COMPARISON: 10/13/2024 CLINICAL HISTORY: Sepsis FINDINGS: CHEST: MEDIASTINUM AND LYMPH NODES: Tracheostomy in the mid trachea, unchanged. 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 Heart and pericardium are otherwise unremarkable. The central airways are clear. No mediastinal, hilar or axillary lymphadenopathy. LUNGS AND PLEURA: Consolidation in the right lower lobe compatible with pneumonia. Dependent atelectasis in the left lower lobe. No pleural effusion or pneumothorax. ABDOMEN AND PELVIS: LIVER: The liver is unremarkable. GALLBLADDER AND BILE DUCTS: Prior cholecystectomy. No biliary ductal dilatation. SPLEEN: No acute abnormality. PANCREAS: No acute abnormality. ADRENAL GLANDS: No acute abnormality. KIDNEYS, URETERS AND BLADDER: Multiple bilateral renal stones including large bilateral renal pelvic stones are unchanged. No ureteral stones or hydronephrosis. No perinephric or periureteral stranding. Foley catheter in the bladder, which is decompressed. Numerous bladder stones are unchanged. GI AND BOWEL: Gastrostomy tube in the stomach. Large stool burden in the rectum, question fecal impaction. Colonic diverticulosis. Normal appendix. There is no bowel obstruction. REPRODUCTIVE ORGANS: No acute abnormality. PERITONEUM AND RETROPERITONEUM: No ascites. No free air. VASCULATURE: Aorta is normal in caliber. Aortoiliac atherosclerosis. ABDOMINAL AND PELVIS LYMPH NODES: No lymphadenopathy. BONES AND SOFT TISSUES: Decubitus ulcer noted in the posterior left hip region with soft tissue gas, unchanged since prior study. Probable sacral decubitus ulcer also noted and unchanged. No evidence of acute osteomyelitis. No acute osseous abnormality. IMPRESSION: 1. Right lower lobe consolidation compatible with pneumonia. 2. Large stool burden in the rectum raising concern for fecal impaction. 3. Pressure ulcer in the posterior left hip  region with soft tissue gas, unchanged. Probable sacral pressure ulcer, unchanged. No evidence of acute osteomyelitis. 4. Multiple bilateral renal calculi including large pelvic stones, unchanged. No hydronephrosis. Numerous bladder stones, stable. 5. Colonic diverticulosis. No active diverticulitis. 6. Aortic atherosclerosis. Electronically signed by: Franky Crease MD 12/03/2024 11:23 PM EST RP Workstation: [id number]   DG Chest Port 1 View Result Date: 12/03/2024 EXAM: 1 VIEW(S) XRAY OF THE CHEST 12/03/2024 10:38:01 PM COMPARISON: 10/13/2024 cxr and ct CLINICAL HISTORY: Questionable sepsis - evaluate for abnormality FINDINGS: LINES, TUBES AND DEVICES: Tracheostomy tube in place with tip 6.5 cm above the carina. LUNGS AND PLEURA: Low lung volumes with bronchovascular crowding. Bibasilar, right greater than left, patchy opacities. No pleural effusion. No pneumothorax. HEART AND MEDIASTINUM: No acute abnormality of the cardiac and mediastinal silhouettes. BONES AND SOFT TISSUES: Nondisplaced median sternotomy wires. Right upper quadrant surgical clips noted. IMPRESSION: 1. Bibasilar, right greater than left, patchy airspace opacities, which may reflect pneumonia or atelectasis.Follow-up PA and lateral chest X-ray is recommended in 3-4 weeks following therapy to ensure resolution and exclude underlying malignancy. Electronically signed by: Morgane Naveau MD 12/03/2024 10:47 PM EST RP Workstation: [id number]    EKG: Independently reviewed. Sinus tachycardia, rate 121.   Assessment/Plan   1. Sepsis suspected secondary to aspiration pneumonia, possible UTI  - Blood and urine cultures were collected in the ED and he was given 30 cc/kg LR bolus, vancomycin , cefepime , and Flagyl   - He has hx of ESBL infections and Pseudomonas in sputum  - Check MRSA pcr, treat with vancomycin  and meropenem  for now, follow cultures and clinical course    2. Tracheostomy status; acute on chronic hypoxic respiratory failure  -  Typically on 1 Lpm supplemental O2 per his wife, currently requiring 5 Lpm in setting of suspected aspiration pneumonia  - Continue tracheostomy care, supplemental O2 as-needed    3. Hypernatremia  - He has been fluid-resuscitated with LR in ED, will start 0.45% NaCl infusion and repeat chem panel in the am    4. Hx of TBI  - Continue supportive care, tube care, modafinil , sertraline   5. Pressure wounds  - Present on arrival, no acute osteomyelitis on CT in ED - Continue wound care    6. Type II DM  - A1c was 7.7% in October 2025  - Check CBGs, use basal and correctional insulin    7. Hypothyroidism  - Continue Synthroid  and Cytomel      DVT prophylaxis: Lovenox   Code Status: DNR/DNI  Level of Care: Level of care: Progressive Family Communication: Wife updated by phone  Disposition Plan:  Patient is from: [facility]  Anticipated d/c is to: [facility]  Anticipated d/c date is: 12/07/24  Patient currently: Pending cultures, clinical improvement  Consults called: None  Admission status: Inpatient     Evalene GORMAN Sprinkles, MD [facility]  12/04/2024, 1:43 AM

## 2024-12-04 NOTE — TOC CM/SW Note (Addendum)
 TOC consult received for d/c planning needs. Per notes, patient came from [facility] (unclear if [facility] or SNF). Message sent via Epic to confirm. Follow-up to be completed with patient as appropriate.   Merilee Batty, MSN, RN Case Management [phone]   1640: Per [facility] rep, patient is from their SNF and his bed is currently being held for his return.

## 2024-12-05 ENCOUNTER — Other Ambulatory Visit: Payer: Self-pay

## 2024-12-05 ENCOUNTER — Other Ambulatory Visit: Payer: Self-pay | Admitting: Internal Medicine

## 2024-12-05 DIAGNOSIS — R6520 Severe sepsis without septic shock: Secondary | ICD-10-CM

## 2024-12-05 DIAGNOSIS — J9621 Acute and chronic respiratory failure with hypoxia: Secondary | ICD-10-CM

## 2024-12-05 DIAGNOSIS — I2510 Atherosclerotic heart disease of native coronary artery without angina pectoris: Secondary | ICD-10-CM

## 2024-12-05 DIAGNOSIS — A4151 Sepsis due to Escherichia coli [E. coli]: Secondary | ICD-10-CM

## 2024-12-05 LAB — BASIC METABOLIC PANEL WITH GFR
Anion gap: 13 (ref 5–15)
Anion gap: 10 (ref 5–15)
BUN: 32 mg/dL — ABNORMAL HIGH (ref 8–23)
BUN: 26 mg/dL — ABNORMAL HIGH (ref 8–23)
CO2: 24 mmol/L (ref 22–32)
CO2: 26 mmol/L (ref 22–32)
Calcium: 8.9 mg/dL (ref 8.9–10.3)
Calcium: 8.3 mg/dL — ABNORMAL LOW (ref 8.9–10.3)
Chloride: 111 mmol/L (ref 98–111)
Chloride: 108 mmol/L (ref 98–111)
Creatinine, Ser: 0.86 mg/dL (ref 0.61–1.24)
Creatinine, Ser: 0.7 mg/dL (ref 0.61–1.24)
GFR, Estimated: >60 mL/min (ref >60)
GFR, Estimated: >60 mL/min (ref >60)
Glucose, Bld: 173 mg/dL — ABNORMAL HIGH (ref 70–99)
Glucose, Bld: 389 mg/dL — ABNORMAL HIGH (ref 70–99)
Potassium: 4 mmol/L (ref 3.5–5.1)
Potassium: 3.8 mmol/L (ref 3.5–5.1)
Sodium: 148 mmol/L — ABNORMAL HIGH (ref 135–145)
Sodium: 144 mmol/L (ref 135–145)

## 2024-12-05 LAB — CBC
HCT: 36.8 % — ABNORMAL LOW (ref 39.0–52.0)
Hemoglobin: 10.7 g/dL — ABNORMAL LOW (ref 13.0–17.0)
MCH: 27.2 pg (ref 26.0–34.0)
MCHC: 29.1 g/dL — ABNORMAL LOW (ref 30.0–36.0)
MCV: 93.4 fL (ref 80.0–100.0)
Platelets: 314 K/uL (ref 150–400)
RBC: 3.94 MIL/uL — ABNORMAL LOW (ref 4.22–5.81)
RDW: 18.6 % — ABNORMAL HIGH (ref 11.5–15.5)
WBC: 7.2 K/uL (ref 4.0–10.5)
nRBC: 0 % (ref 0.0–0.2)

## 2024-12-05 LAB — GLUCOSE, CAPILLARY
Glucose-Capillary: 153 mg/dL — ABNORMAL HIGH (ref 70–99)
Glucose-Capillary: 156 mg/dL — ABNORMAL HIGH (ref 70–99)
Glucose-Capillary: 209 mg/dL — ABNORMAL HIGH (ref 70–99)
Glucose-Capillary: 280 mg/dL — ABNORMAL HIGH (ref 70–99)
Glucose-Capillary: 284 mg/dL — ABNORMAL HIGH (ref 70–99)
Glucose-Capillary: 333 mg/dL — ABNORMAL HIGH (ref 70–99)

## 2024-12-05 LAB — MRSA NEXT GEN BY PCR, NASAL: MRSA by PCR Next Gen: DETECTED — AB

## 2024-12-05 MED ORDER — FREE WATER
150.0000 mL | Freq: Three times a day (TID) | Status: DC
  Administered 2024-12-05 – 2024-12-08 (×9): 150 mL

## 2024-12-05 MED ORDER — CHLORHEXIDINE GLUCONATE CLOTH 2 % EX PADS: 6.0000 | MEDICATED_PAD | Freq: Every day | CUTANEOUS | Status: DC

## 2024-12-05 MED ORDER — DEXTROSE 5 % IV SOLN: INTRAVENOUS | Status: DC

## 2024-12-05 MED ORDER — MUPIROCIN 2 % EX OINT
1.0000 | TOPICAL_OINTMENT | Freq: Two times a day (BID) | CUTANEOUS | Status: DC
  Administered 2024-12-05 – 2024-12-08 (×7): 1 via NASAL
  Filled 2024-12-05 (×2): qty 22

## 2024-12-05 MED ORDER — THIAMINE MONONITRATE 100 MG PO TABS
100.0000 mg | ORAL_TABLET | Freq: Every day | ORAL | Status: DC
  Administered 2024-12-06 – 2024-12-08 (×3): 100 mg
  Filled 2024-12-05 (×3): qty 1

## 2024-12-05 MED ORDER — DAKINS (1/4 STRENGTH) 0.125 % EX SOLN
Freq: Every day | CUTANEOUS | Status: DC
  Filled 2024-12-05 (×3): qty 473

## 2024-12-05 MED ORDER — GERHARDT'S BUTT CREAM
TOPICAL_CREAM | Freq: Three times a day (TID) | CUTANEOUS | Status: DC
  Filled 2024-12-05: qty 60

## 2024-12-05 NOTE — Progress Notes (Signed)
 Patient is lethargic and not answering any questions

## 2024-12-05 NOTE — Progress Notes (Signed)
 Initial Nutrition Assessment  DOCUMENTATION CODES:   Non-severe (moderate) malnutrition in context of chronic illness  INTERVENTION:  - Continue tube feeding via PEG tube: Glucerna 1.5 at 60 ml/h (1440 ml per day) Add FWF 150ml q8h (450 ml per day) Provides 2160 kcal, 119 gm protein, 1092 ml free water  daily (1543 ml total per day FWF+TF)   - Monitor magnesium , potassium, and phosphorus daily for at least 3 days, MD to replete as needed.  -Add thiamine  100mg  daily per tube x5 days  - Monitor weight trends; collect new weight to assess trend since last admission  -Once tolerance assessed, consider addition of vitamin C  supplement, Juven BID to support wound healing   NUTRITION DIAGNOSIS:  Moderate Malnutrition related to chronic illness as evidenced by mild fat depletion, moderate muscle depletion.  GOAL:  Patient will meet greater than or equal to 90% of their needs  MONITOR:  TF tolerance, Labs, Skin, Weight trends  REASON FOR ASSESSMENT:   Consult Enteral/tube feeding initiation and management  ASSESSMENT:   Pt with PMH significant for: TBI w/ aphasia and hemiparesis, trach and PEG dependent, CAD, T2DM, hypotension, ESBL infections. He is a resident at [facility] and is admitted for sepsis 2/2 aspiration PNA and possible UTI.  Urine cultures growing Klebsiella pneumonia, E. Coli. MRSA PCR positive. No notable events overnight.  Patient is nonverbal at baseline. No family/visitors at bedside at time of visit. Chart review completed to obtain nutrition-related history. Noted with recent emesis at [facility] and to be febrile and tachycardic on admission. CT of chest showed RLL PNA along with large stool burden. Admitted on 5L O2 and now down to 2L curently.   Per the home meds list, patient was getting Glucerna 1.5 @ 14mL/hr. No free water  flushes noted. Given inability to confirm whether he was receiving tube feeds as ordered, will monitor for refeeding and add thiamine .    Admit /Current Weight: 82.4 kg - appears pulled forward from last encounter in October of this year  He is unable to state UBW. Per chart review, last weight is from January 2024 when patient was weighed at 174lbs. He was weighed this admission in October at 178lbs. Current weight on admission appears pulled forward from admission in October. Will order new weight collection to assess trend since discharge one and a half months ago. As there is no weight history over the past year, unable to determine any significant changes in weight in recent past. No significant edema on exam.   Drains/Lines: LUQ: PEG  Tracheostomy: Shiley, 6mm uncuffed Foley catheter UOP: 800 ml x24 hours  Noted with persistent hypernatremia for which attending started dextrose . Discussed and recommended free water  flushes. MD amicable to this. Will monitor trend and adjust free water  as indicated.   Meds: SS Novolog , Lantus , levothyroxine , Miralax , IV ABX  Per home med list, he was also getting 500mg  vitamin C  and an MVI, omega-3 acid ethyl esters 1g daily, colace 100mg  BID, Lasix  20mg  daily, and ferrous sulfate  300mg  BID. Not currently ordered here. For constipation, PRN order for Citrucel 500mg  TID via tube.   Home insulin  regimen noted, per home med list, as: insulin  aspart 0-15 units q6h, Semglee  25 units QHS,   Labs:  Na+ 148 (H) K+ 4.0 (wdl) CBGs 173-362 x24 hours A1c 7.7 (09/2024)  NUTRITION - FOCUSED PHYSICAL EXAM: Muscle and fat depletions appear stable from last admission. Some muscle depletions, predominantly those in lower extremities, can be attributed to disuse and not under nutrition. Therefore, cannot  use as a prognostic indicator for malnutrition.   Flowsheet Row Most Recent Value  Orbital Region Mild depletion  Upper Arm Region No depletion  Thoracic and Lumbar Region Mild depletion  Buccal Region No depletion  Temple Region Moderate depletion  Clavicle Bone Region Mild depletion  Clavicle  and Acromion Bone Region Mild depletion  Scapular Bone Region Moderate depletion  Dorsal Hand No depletion  Patellar Region Severe depletion  Anterior Thigh Region Severe depletion  Posterior Calf Region Severe depletion  Edema (RD Assessment) None  Hair Reviewed  Eyes Unable to assess  [sleeping,  does not follow commands]  Mouth Reviewed  Skin Reviewed  Nails Reviewed     Diet Order:   Diet Order             Diet NPO time specified  Diet effective now              EDUCATION NEEDS:   No education needs have been identified at this time  Skin:  Skin Assessment: Skin Integrity Issues: Skin Integrity Issues:: Stage IV, Stage III, DTI DTI: L heel Stage III: ischial tuberosity Stage IV: coccyx  Per WOC note (12/09): Wound type: L hip unstageable presssure injury L heel unstageable pressure injury Sacral stage 4 pressure injury L ischium healing pressure injury- unable to determine initial stage from chart review R trochanter stage 3 pressure injury Full thickness wound to R frontal LE Full thickness wound to top of head- inconsistent with pressure location Pressure Injury POA: Yes Measurement: see nursing flow sheets Wound bed: L hip: 100 % covered with yellow loose slough L heel: 100 % covered with dry black eschar Sacrum: 100 % red, moist L ischium: 100 % red, dry R trochanter: 20% red dry, 80% pink dry, darkened discoloration in the periwound area RLE: 100 % covered with dry scab/eschar Top of head: 100 % dry scab/eschar Drainage (amount, consistency, odor) see nursing flow sheets Periwound: intact for all wounds  except R trochanter (see noted periwound changes above)  Last BM:  12/08 - type 6/7 x 3 (small)  Height:  Ht Readings from Last 1 Encounters:  12/03/24 6' (1.829 m)   Weight:  Wt Readings from Last 1 Encounters:  12/03/24 82.4 kg    Ideal Body Weight:  80.9 kg  BMI:  Body mass index is 24.64 kg/m.  Estimated Nutritional Needs:   Kcal:   2100-2300 kcals  Protein:  115-130g  Fluid:  >2L/day  Blair Deaner MS, RD, LDN Registered Dietitian Clinical Nutrition RD Inpatient Contact Info in Amion

## 2024-12-05 NOTE — Plan of Care (Signed)
  Problem: Fluid Volume: Goal: Hemodynamic stability will improve Outcome: Progressing   Problem: Clinical Measurements: Goal: Diagnostic test results will improve Outcome: Progressing Goal: Signs and symptoms of infection will decrease Outcome: Progressing   Problem: Respiratory: Goal: Ability to maintain adequate ventilation will improve Outcome: Progressing   Problem: Education: Goal: Ability to describe self-care measures that may prevent or decrease complications (Diabetes Survival Skills Education) will improve Outcome: Progressing Goal: Individualized Educational Video(s) Outcome: Progressing   Problem: Coping: Goal: Ability to adjust to condition or change in health will improve Outcome: Progressing   Problem: Fluid Volume: Goal: Ability to maintain a balanced intake and output will improve Outcome: Progressing   Problem: Health Behavior/Discharge Planning: Goal: Ability to identify and utilize available resources and services will improve Outcome: Progressing Goal: Ability to manage health-related needs will improve Outcome: Progressing   Problem: Metabolic: Goal: Ability to maintain appropriate glucose levels will improve Outcome: Progressing   Problem: Nutritional: Goal: Maintenance of adequate nutrition will improve Outcome: Progressing Goal: Progress toward achieving an optimal weight will improve Outcome: Progressing   Problem: Skin Integrity: Goal: Risk for impaired skin integrity will decrease Outcome: Progressing   Problem: Tissue Perfusion: Goal: Adequacy of tissue perfusion will improve Outcome: Progressing   Problem: Education: Goal: Knowledge of General Education information will improve Description: Including pain rating scale, medication(s)/side effects and non-pharmacologic comfort measures Outcome: Progressing   Problem: Health Behavior/Discharge Planning: Goal: Ability to manage health-related needs will improve Outcome:  Progressing   Problem: Clinical Measurements: Goal: Ability to maintain clinical measurements within normal limits will improve Outcome: Progressing Goal: Will remain free from infection Outcome: Progressing Goal: Diagnostic test results will improve Outcome: Progressing Goal: Respiratory complications will improve Outcome: Progressing Goal: Cardiovascular complication will be avoided Outcome: Progressing   Problem: Nutrition: Goal: Adequate nutrition will be maintained Outcome: Progressing   Problem: Coping: Goal: Level of anxiety will decrease Outcome: Progressing   Problem: Elimination: Goal: Will not experience complications related to bowel motility Outcome: Progressing Goal: Will not experience complications related to urinary retention Outcome: Progressing   Problem: Pain Managment: Goal: General experience of comfort will improve and/or be controlled Outcome: Progressing   Problem: Safety: Goal: Ability to remain free from injury will improve Outcome: Progressing   Problem: Skin Integrity: Goal: Risk for impaired skin integrity will decrease Outcome: Progressing

## 2024-12-05 NOTE — TOC Initial Note (Signed)
 Transition of Care [facility]) - Initial/Assessment Note    Patient Details  Name: Joshua Haley MRN: [id number] Date of Birth: [birth year]  Transition of Care [facility]) CM/SW Contact:    Lauraine FORBES Saa, LCSWA Phone Number: 12/05/2024, 9:26 AM  Clinical Narrative:                  9:26 AM Per chart review, patient is from [facility]. [facility] confirmed patient is LTC at [facility]. Patient has a PCP and insurance. Patient does not have HH or DME history. Patient's preferred pharmacy's are [facility] and [facility]. CSW to submit FL2 to SNF upon PT/OT recommendations. CSW will continue to follow.  Expected Discharge Plan: Long Term Nursing Home Barriers to Discharge: Continued Medical Work up   Patient Goals and CMS Choice            Expected Discharge Plan and Services In-house Referral: Clinical Social Work   Post Acute Care Choice: Skilled Nursing Facility Living arrangements for the past 2 months: Skilled Nursing Facility                                      Prior Living Arrangements/Services Living arrangements for the past 2 months: Skilled Nursing Facility Lives with:: Facility Resident Patient language and need for interpreter reviewed:: Yes        Need for Family Participation in Patient Care: Yes (Comment) Care giver support system in place?: Yes (comment) Current home services: DME Criminal Activity/Legal Involvement Pertinent to Current Situation/Hospitalization: No - Comment as needed  Activities of Daily Living   ADL Screening (condition at time of admission) Independently performs ADLs?: No Does the patient have a NEW difficulty with bathing/dressing/toileting/self-feeding that is expected to last >3 days?: No Does the patient have a NEW difficulty with getting in/out of bed, walking, or climbing stairs that is expected to last >3 days?: No Does the patient have a NEW difficulty with communication that is expected to last >3 days?: No Is the  patient deaf or have difficulty hearing?: No (UTA) Does the patient have difficulty seeing, even when wearing glasses/contacts?: No (UTA) Does the patient have difficulty concentrating, remembering, or making decisions?: No (UTA)  Permission Sought/Granted Permission sought to share information with : Family Supports, Oceanographer granted to share information with : No (Contact information on chart)  Share Information with NAME: [name]  Permission granted to share info w AGENCY: [facility] LTC  Permission granted to share info w Relationship: Spouse  Permission granted to share info w Contact Information: [phone]  Emotional Assessment   Attitude/Demeanor/Rapport: Unable to Assess, Intubated (Following Commands or Not Following Commands) Affect (typically observed): Unable to Assess   Alcohol  / Substance Use: Not Applicable Psych Involvement: No (comment)  Admission diagnosis:  Hypernatremia [E87.0] Acute cystitis without hematuria [N30.00] Sacral decubitus ulcer, 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] Sepsis (HCC) [A41.9] Pneumonia due to infectious organism, unspecified laterality, unspecified part of lung [J18.9] Sepsis without acute organ dysfunction, due to unspecified organism [facility]) [A41.9] Patient Active Problem List   Diagnosis Date Noted   Hypernatremia 12/04/2024   Pressure injury of skin 10/14/2024   Sepsis (HCC) 10/13/2024   Acute respiratory failure with hypoxia (HCC) 01/24/2023   Sepsis due to undetermined organism (HCC) 12/28/2022   Hospital-acquired pneumonia 12/28/2022   Moderate protein-calorie malnutrition 09/12/2022   Urethral bleeding 09/11/2022   Hyperglycemia 09/11/2022  Acute encephalopathy 09/11/2022   Sacral decubitus ulcer 09/11/2022   CAD (coronary artery disease) 09/11/2022   Septic shock (HCC) 09/10/2022   Gross hematuria    Aspiration into airway    Chronic respiratory failure with hypoxia (HCC)    Acute on  chronic respiratory failure with hypoxia (HCC) 06/21/2022   Seizure disorder (HCC) 06/21/2022   Pneumonia 06/21/2022   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 [place] [address] Phone: [phone] Fax: [phone]  [facility] [place], [place] - [address] [place] [address] Phone: [phone] Fax: [phone]     Social Drivers of Health (SDOH) Social History: SDOH Screenings   Food Insecurity: Patient Unable To Answer (10/15/2024)  Housing: Patient Unable To Answer (10/15/2024)  Transportation Needs: Patient Unable To Answer (10/15/2024)  Utilities: Patient Unable To Answer (10/15/2024)  Depression (PHQ2-9): Low Risk  (02/22/2020)  Social Connections: Patient Unable To Answer (10/15/2024)  Tobacco Use: Medium Risk (12/04/2024)   SDOH Interventions:     Readmission Risk Interventions    10/16/2024    4:35 PM  Readmission Risk Prevention Plan  Transportation Screening Complete  PCP or Specialist Appt within 5-7 Days Complete  Home Care Screening Complete  Medication Review (RN CM) Complete

## 2024-12-05 NOTE — Consult Note (Signed)
 WOC Nurse Consult Note:  WOC consult performed remotely utilizing imaging and chart review Reason for Consult: multiple pressure injuries Wound type: L hip unstageable presssure injury L heel unstageable pressure injury Sacral stage 4 pressure injury L ischium healing pressure injury- unable to determine initial stage from chart review R trochanter stage 3 pressure injury Full thickness wound to R frontal LE Full thickness wound to top of head- inconsistent with pressure location Pressure Injury POA: Yes Measurement: see nursing flow sheets Wound bed: L hip: 100 % covered with yellow loose slough L heel: 100 % covered with dry black eschar Sacrum: 100 % red, moist L ischium: 100 % red, dry R trochanter: 20% red dry, 80% pink dry, darkened discoloration in the periwound area RLE: 100 % covered with dry scab/eschar Top of head: 100 % dry scab/eschar Drainage (amount, consistency, odor) see nursing flow sheets Periwound: intact for all wounds  except R trochanter (see noted periwound changes above) Dressing procedure/placement/frequency:  L hip: Cleanse with NS, pat dry.  Pack with Dakins soaked  kerlix gauze using a cotton tipped applicator to advance into wound bed.  Cover with dry gauze and tape.  Change daily for 7 days. L heel: paint with betadine and leave open to air. Sacral: Cleanse with NS, pat dry.  Pack wound with Saline soaked gauze and cover with silicone foam dressing.  Change daily. L ischium/ R trochanter/ RLE/ top of head: Cleanse with NS, pat dry.  Apply Xeroform to wound bed and cover with silicone foam dressing.  Change daily.                 WOC Nurse team will follow with you and see patient within 10 days for wound assessments.  Please notify WOC nurses of any acute changes in the wounds or any new areas of concern  Thank you,  Doyal Polite, MSN, RN, [facility] WOC Team [phone] (Available Mon-Fri 0700-1500)

## 2024-12-05 NOTE — Progress Notes (Signed)
 Consultation Progress Note   Patient: Joshua Haley FMW:[id number] DOB: 03/31/1951 DOA: 12/03/2024 DOS: the patient was seen and examined on 12/05/2024 Primary service: DibiaLandon BRAVO, MD  Brief hospital course: 74 y.o. male with medical history significant for TBI with aphasia and hemiparesis, tracheostomy and G-tube dependence, CAD, type 2 diabetes mellitus, chronic hypotension, history of ESBL infections, Pseudomonas in sputum, and chronic 1 L/min supplemental oxygen requirement who presents from [facility] with concerns for sepsis.  Patient is nonverbal at baseline, per chart review patient was febrile with tachycardia, tachypnea and hyperglycemic reported to be vomiting at the long-term facility.  CT of the chest showed right lower lobe pneumonia, large stool burden and unchanged pressure ulcers without acute osteomyelitis. Received IV hydration, and started on broad-spectrum antibiotics including vancomycin , cefepime  and Flagyl .  Assessment and Plan: 1. Sepsis suspected secondary to aspiration pneumonia, possible UTI  - Patient presented with sepsis and elevated lactate,  started on IV antibiotics and IV fluids.  Lactate normalized post 30 cc/kg. Previous history of Pseudomonas in sputum and ESBL infections Urine culture growing Klebsiella pneumonia, E. Coli Cultures show no growth in 1 day MRSA PCR positive Continue vancomycin , Merrem  Leukocytosis resolved afebrile Monitor clinical course.   2. Tracheostomy status; acute on chronic hypoxic respiratory failure  - Baseline patient uses 1 L/min of supplemental oxygen, upon admission he required 5 L currently this has been weaned down to 2 L. - Continue tracheostomy care, supplemental O2 as-needed     3. Hypernatremia  - Worsening sodium levels with 0.45% NaCl infusion, will start patient on D5W and monitor sodium every 12 hours  4. Hx of TBI  - Continue supportive care, tube care, modafinil , sertraline      5. Pressure wounds  Patient  with multiple unstageable pressure injury. L hip unstageable presssure injury L heel unstageable pressure injury, Sacral stage 4 pressure injury L ischium healing pressure injury, R trochanter stage 3 pressure injury Full thickness wound to R frontal LE. Full thickness wound to top of head-  - Present on arrival, no acute osteomyelitis on CT  - Continue wound care     6. Type II DM  - A1c was 7.7% in October 2025  - Check CBGs, use basal and correctional insulin     7. Hypothyroidism  - Continue Synthroid  and Cytomel           TRH will continue to follow the patient.  Subjective: Patient seen at bedside this morning, he opened his eyes with mild to sternal rub. Baseline patient is nonverbal, spoke with RN who states that there was no acute overnight events.  Physical Exam: Vitals:   12/05/24 0343 12/05/24 0347 12/05/24 0800 12/05/24 1004  BP: 133/61  110/62   Pulse: 91 91 88   Resp: 14 14 20  (!) 22  Temp: 99.1 F (37.3 C)  97.8 F (36.6 C)   TempSrc: Oral  Oral   SpO2: 96% 97% 95%   Weight:      Height:       General   Lethargic Eyes: PERRL, lids and conjunctivae normal ENMT: Mucous membranes are moist.   Neck: normal, supple, no masses, no thyromegaly Respiratory: clear to auscultation bilaterally, no wheezing, no crackles. Normal respiratory effort. No accessory muscle use.  Cardiovascular: Regular rate and rhythm, no murmurs / rubs / gallops Abdomen: Soft, nontender nondistended. Musculoskeletal: no clubbing / cyanosis. No joint deformity upper and lower extremities.  Skin: no rashes, lesions, ulcers. No induration Neurologic: Left sided contracture, right-sided weakness.  Nonverbal.  Opens eyes to pain.   Data Reviewed:  CBC    Component Value Date/Time   WBC 7.2 12/05/2024 0646   RBC 3.94 (L) 12/05/2024 0646   HGB 10.7 (L) 12/05/2024 0646   HCT 36.8 (L) 12/05/2024 0646   PLT 314 12/05/2024 0646   MCV 93.4 12/05/2024 0646   MCH 27.2 12/05/2024 0646    MCHC 29.1 (L) 12/05/2024 0646   RDW 18.6 (H) 12/05/2024 0646   LYMPHSABS 1.1 12/03/2024 2215   MONOABS 0.7 12/03/2024 2215   EOSABS 0.1 12/03/2024 2215   BASOSABS 0.1 12/03/2024 2215   CMP     Component Value Date/Time   NA 148 (H) 12/05/2024 0244   K 4.0 12/05/2024 0244   CL 111 12/05/2024 0244   CO2 24 12/05/2024 0244   GLUCOSE 173 (H) 12/05/2024 0244   BUN 32 (H) 12/05/2024 0244   CREATININE 0.86 12/05/2024 0244   CALCIUM 8.9 12/05/2024 0244   PROT 8.5 (H) 12/03/2024 2215   ALBUMIN 2.3 (L) 12/03/2024 2215   AST 19 12/03/2024 2215   ALT 13 12/03/2024 2215   ALKPHOS 105 12/03/2024 2215   BILITOT 0.6 12/03/2024 2215   GFRNONAA >60 12/05/2024 0244     Family Communication: Spoke with wife Mrs. [name] Sena,[phone] updated her on patients clinical progress, answered all her questions.  Time spent: 35 minutes.  Author: Landon FORBES Baller, MD 12/05/2024 10:42 AM  For on call review [email or web address].

## 2024-12-05 NOTE — Evaluation (Addendum)
 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    CAD (coronary artery disease)    Diabetes mellitus without complication (HCC)    Hypertension    Hypothyroidism    Kidney stone    TBI (traumatic brain injury) (HCC)    Tracheostomy in place [facility])    Past Surgical History:  Past Surgical History:  Procedure Laterality Date   CHOLECYSTECTOMY     CRANIECTOMY     JEJUNOSTOMY FEEDING TUBE     LEFT HEART CATH AND CORONARY ANGIOGRAPHY N/A 01/22/2020   Procedure: LEFT HEART CATH AND CORONARY ANGIOGRAPHY and possible PCI and stent;  Surgeon: Florencio Cara BIRCH, MD;  Location: [facility] INVASIVE CV LAB;  Service: Cardiovascular;  Laterality: N/A;   tracheostomy     HPI:  Joshua Haley is a 73 yo male presenting from [facility] 12/7 with concerns for sepsis after exhibiting fever, tachycardia, tachypnea, and hyperglycemia at facility. CT Chest showed RLL PNA, large stool burden, and unchanged pressure ulcers without acute osteomyelitis. PMH includes: TBI with aphasia and hemiparesis, trach (capped), g-tube, CAD, DMII, chronic hypotension, chronic 1L O2. Per wife, pt was eating and drinking after initial TBI 12/03/21 but stopped both after a regression ~6 months later. SLP services stopped at [facility] due to lack of progress.  PMV had been attempted, but he would pull it off, either resulting in lost valves or sometimes being found in his mouth. At his baseline, Joshua Haley says he will vocalize but not verbalize, tracks, waves, and inconsistently uses familiar objects. He has been sick so much recently that he has been increasingly lethargic and sleeping most of the time though.    Assessment / Plan /  Recommendation  Clinical Impression  Pt does not appear to be appropriate for any POs at this time with impact from mentation, having moments that appeared to be near his baseline but not for sustained periods. Recommend that he remain NPO with use of G-tube. Will f/u briefly to see if there are any changes if mentation continues to clear. PMV eval deferred as pt's trach is capped.  Pt shows some signs of baseline mentation as described by his wife, Joshua Haley, on previous date. This includes opening his eyes to stimulation, partially tracking SLP, and vocalizing unintelligibly. He tends to drift back to sleep as soon as stimulation stops though, which may still be a little more lethargic than his baseline. SLP provided oral care to remove thick, dried secretions from his lips/oral cavity. Between breathing through his mouth and receicing supplemental O2, his oral cavity is very dry. SLP also offered ice chips and water  siphoned via straw, with pt demonstrating some evidence of awareness by moving his lips toward the stimulus, although this occurs inconsistently. He makes no active attempts to get boluses.   SLP Visit Diagnosis: Dysphagia, unspecified (R13.10)      Swallow Evaluation Recommendations Recommendations: NPO;Alternative means of nutrition - G Tube Medication Administration: Via alternative means Oral care recommendations: Oral care QID (4x/day)   Assistance Recommended at Discharge    Functional Status Assessment    Frequency and Duration min 1 x/week  1 week       Prognosis Prognosis for improved oropharyngeal function: Guarded Barriers to  Reach Goals: Time post onset;Severity of deficits;Cognitive deficits      Swallow Study   General HPI: Joshua Haley is a 73 yo male presenting from [facility] 12/7 with concerns for sepsis after exhibiting fever, tachycardia, tachypnea, and hyperglycemia at facility. CT Chest showed RLL PNA, large stool burden, and unchanged pressure ulcers without acute  osteomyelitis. PMH includes: TBI with aphasia and hemiparesis, trach (capped), g-tube, CAD, DMII, chronic hypotension, chronic 1L O2. Per wife, pt was eating and drinking after initial TBI 12/03/21 but stopped both after a regression ~6 months later. SLP services stopped at [facility] due to lack of progress.  PMV had been attempted, but he would pull it off, either resulting in lost valves or sometimes being found in his mouth. At his baseline, Joshua Haley says he will vocalize but not verbalize, tracks, waves, and inconsistently uses familiar objects. He has been sick so much recently that he has been increasingly lethargic and sleeping most of the time though. Type of Study: Bedside Swallow Evaluation Previous Swallow Assessment: none available for review, but NPO with G-tube per wife (for the last ~1.5 years) Diet Prior to this Study: NPO;G-tube Temperature Spikes Noted: No Respiratory Status: Nasal cannula;Other (comment) (trach capped) History of Recent Intubation: No Behavior/Cognition: Lethargic/Drowsy;Doesn't follow directions Oral Cavity Assessment: Dry;Dried secretions Oral Care Completed by SLP: Yes Self-Feeding Abilities: Total assist Patient Positioning: Upright in bed Baseline Vocal Quality: Low vocal intensity Volitional Cough: Cognitively unable to elicit Volitional Swallow: Unable to elicit    Oral/Motor/Sensory Function Overall Oral Motor/Sensory Function: Other (comment) (not following commands for assessment)   Ice Chips Ice chips: Impaired Presentation: Spoon Oral Phase Impairments: Poor awareness of bolus   Thin Liquid Thin Liquid: Impaired Presentation: Straw Oral Phase Impairments: Poor awareness of bolus    Nectar Thick Nectar Thick Liquid: Not tested   Honey Thick Honey Thick Liquid: Not tested   Puree Puree: Not tested   Solid     Solid: Not tested      Leita SAILOR., M.A. CCC-SLP Acute Rehabilitation Services Office: [phone]  Secure chat  preferred  12/05/2024,11:34 AM

## 2024-12-06 DIAGNOSIS — E440 Moderate protein-calorie malnutrition: Secondary | ICD-10-CM | POA: Insufficient documentation

## 2024-12-06 LAB — BASIC METABOLIC PANEL WITH GFR
Anion gap: 7 (ref 5–15)
Anion gap: 9 (ref 5–15)
BUN: 26 mg/dL — ABNORMAL HIGH (ref 8–23)
BUN: 25 mg/dL — ABNORMAL HIGH (ref 8–23)
CO2: 30 mmol/L (ref 22–32)
CO2: 29 mmol/L (ref 22–32)
Calcium: 8.4 mg/dL — ABNORMAL LOW (ref 8.9–10.3)
Calcium: 8.3 mg/dL — ABNORMAL LOW (ref 8.9–10.3)
Chloride: 106 mmol/L (ref 98–111)
Chloride: 103 mmol/L (ref 98–111)
Creatinine, Ser: 0.55 mg/dL — ABNORMAL LOW (ref 0.61–1.24)
Creatinine, Ser: 0.46 mg/dL — ABNORMAL LOW (ref 0.61–1.24)
GFR, Estimated: >60 mL/min (ref >60)
GFR, Estimated: >60 mL/min (ref >60)
Glucose, Bld: 302 mg/dL — ABNORMAL HIGH (ref 70–99)
Glucose, Bld: 226 mg/dL — ABNORMAL HIGH (ref 70–99)
Potassium: 3.7 mmol/L (ref 3.5–5.1)
Potassium: 4.1 mmol/L (ref 3.5–5.1)
Sodium: 143 mmol/L (ref 135–145)
Sodium: 141 mmol/L (ref 135–145)

## 2024-12-06 LAB — URINE CULTURE: Culture: >=100000 — AB

## 2024-12-06 LAB — CBC
HCT: 29.5 % — ABNORMAL LOW (ref 39.0–52.0)
Hemoglobin: 8.8 g/dL — ABNORMAL LOW (ref 13.0–17.0)
MCH: 27.3 pg (ref 26.0–34.0)
MCHC: 29.8 g/dL — ABNORMAL LOW (ref 30.0–36.0)
MCV: 91.6 fL (ref 80.0–100.0)
Platelets: 326 K/uL (ref 150–400)
RBC: 3.22 MIL/uL — ABNORMAL LOW (ref 4.22–5.81)
RDW: 18.2 % — ABNORMAL HIGH (ref 11.5–15.5)
WBC: 5.6 K/uL (ref 4.0–10.5)
nRBC: 0 % (ref 0.0–0.2)

## 2024-12-06 LAB — GLUCOSE, CAPILLARY
Glucose-Capillary: 282 mg/dL — ABNORMAL HIGH (ref 70–99)
Glucose-Capillary: 283 mg/dL — ABNORMAL HIGH (ref 70–99)
Glucose-Capillary: 272 mg/dL — ABNORMAL HIGH (ref 70–99)
Glucose-Capillary: 230 mg/dL — ABNORMAL HIGH (ref 70–99)
Glucose-Capillary: 192 mg/dL — ABNORMAL HIGH (ref 70–99)

## 2024-12-06 LAB — MAGNESIUM: Magnesium: 2.1 mg/dL (ref 1.7–2.4)

## 2024-12-06 LAB — PHOSPHORUS: Phosphorus: 1.8 mg/dL — ABNORMAL LOW (ref 2.5–4.6)

## 2024-12-06 LAB — HEMOGLOBIN AND HEMATOCRIT, BLOOD
HCT: 30.2 % — ABNORMAL LOW (ref 39.0–52.0)
Hemoglobin: 9.2 g/dL — ABNORMAL LOW (ref 13.0–17.0)

## 2024-12-06 MED ORDER — POTASSIUM & SODIUM PHOSPHATES 280-160-250 MG PO PACK
2.0000 | PACK | ORAL | Status: DC
  Filled 2024-12-06 (×3): qty 2

## 2024-12-06 MED ORDER — INSULIN ASPART 100 UNIT/ML IJ SOLN
3.0000 [IU] | INTRAMUSCULAR | Status: DC
  Administered 2024-12-06 – 2024-12-08 (×13): 3 [IU] via SUBCUTANEOUS
  Filled 2024-12-06 (×11): qty 3

## 2024-12-06 MED ORDER — POTASSIUM & SODIUM PHOSPHATES 280-160-250 MG PO PACK
2.0000 | PACK | ORAL | Status: AC
  Administered 2024-12-06 (×2): 2
  Filled 2024-12-06 (×2): qty 2

## 2024-12-06 NOTE — TOC Progression Note (Signed)
 Transition of Care [facility]) - Progression Note    Patient Details  Name: Joshua Haley MRN: [id number] Date of Birth: 1951/08/05  Transition of Care [facility]) CM/SW Contact  Lauraine FORBES Saa, LCSWA Phone Number: 12/06/2024, 1:25 PM  Clinical Narrative:     1:26 PM CSW introduced self and role to patient's spouse, Joshua Haley. Joshua Haley confirmed patient is from [facility] and is to return when medically ready. Joshua Haley inquired about workers comp access to [employer]. CSW suggested works comp merchandiser, retail records and provided Joshua Haley with medical records contact information. Joshua Haley expressed understanding of the information. Per progressions, patient is unable to work with PT/OT. CSW sent patient's FL2 to [facility]. CSW will continue to follow.  Expected Discharge Plan: Long Term Nursing Home Barriers to Discharge: Continued Medical Work up               Expected Discharge Plan and Services In-house Referral: Clinical Social Work   Post Acute Care Choice: Skilled Nursing Facility Living arrangements for the past 2 months: Skilled Nursing Facility                                       Social Drivers of Health (SDOH) Interventions SDOH Screenings   Food Insecurity: Patient Unable To Answer (10/15/2024)  Housing: Patient Unable To Answer (10/15/2024)  Transportation Needs: Patient Unable To Answer (10/15/2024)  Utilities: Patient Unable To Answer (10/15/2024)  Depression (PHQ2-9): Low Risk  (02/22/2020)  Social Connections: Patient Unable To Answer (10/15/2024)  Tobacco Use: Medium Risk (12/04/2024)    Readmission Risk Interventions    10/16/2024    4:35 PM  Readmission Risk Prevention Plan  Transportation Screening Complete  PCP or Specialist Appt within 5-7 Days Complete  Home Care Screening Complete  Medication Review (RN CM) Complete

## 2024-12-06 NOTE — Progress Notes (Signed)
 Patient has an order for daily weight, patient is in a specialty bed and the weight function is broken. Unable to obtain patients weight.

## 2024-12-06 NOTE — NC FL2 (Signed)
 [place]  MEDICAID FL2 LEVEL OF CARE FORM     IDENTIFICATION  Patient Name: Joshua Haley Birthdate: 02/28/1951 Sex: male Admission Date (Current Location): 12/03/2024  [facility] and [place] Number:  Producer, Television/film/video and Address:  [facility], [address] [place], [place] [address]      Provider Number: [id number]  Attending Physician Name and Address:  Dibia, Landon BRAVO, MD  Relative Name and Phone Number:  [facility] [place]; Spouse; [phone]    Current Level of Care: Hospital Recommended Level of Care: Skilled Nursing Facility Prior Approval Number:    Date Approved/Denied:   PASRR Number: [id number]  Discharge Plan: SNF    Current Diagnoses: Patient Active Problem List   Diagnosis Date Noted   Malnutrition of moderate degree 12/06/2024   Hypernatremia 12/04/2024   Pressure injury of skin 10/14/2024   Sepsis (HCC) 10/13/2024   Acute respiratory failure with hypoxia (HCC) 01/24/2023   Sepsis due to undetermined organism (HCC) 12/28/2022   Hospital-acquired pneumonia 12/28/2022   Moderate protein-calorie malnutrition 09/12/2022   Urethral bleeding 09/11/2022   Hyperglycemia 09/11/2022   Acute encephalopathy 09/11/2022   Sacral decubitus ulcer 09/11/2022   CAD (coronary artery disease) 09/11/2022   Septic shock (HCC) 09/10/2022   Gross hematuria    Aspiration into airway    Chronic respiratory failure with hypoxia (HCC)    Acute on chronic respiratory failure with hypoxia (HCC) 06/21/2022   Seizure disorder (HCC) 06/21/2022   Pneumonia 06/21/2022   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, sequela 06/21/2022   Tracheostomy status (HCC) 06/21/2022   T2DM (type 2 diabetes mellitus) (HCC) 01/20/2020   Nephrolithiasis 01/20/2020   Hypothyroidism 01/20/2020   GERD (gastroesophageal reflux disease) 01/20/2020   HTN (hypertension) 01/20/2020   Chest pain 01/20/2020    Orientation RESPIRATION BLADDER  Height & Weight        O2, Tracheostomy (Trach Collar; 1L nasal cannula; 24% FiO2) Indwelling catheter, Incontinent Weight: 181 lb 10.5 oz (82.4 kg) (wt from 10/17/2024) Height:  6' (182.9 cm)  BEHAVIORAL SYMPTOMS/MOOD NEUROLOGICAL BOWEL NUTRITION STATUS    Convulsions/Seizures (History of seizures) Incontinent Diet (Please see discharge summary. 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)                       Personal Care Assistance Level of Assistance              Functional Limitations Info  Speech     Speech Info: Impaired (Trach collar)    SPECIAL CARE FACTORS FREQUENCY  Speech therapy             Speech Therapy Frequency: 3x      Contractures Contractures Info: Not present    Additional Factors Info  Code Status, Isolation Precautions, Allergies, Insulin  Sliding Scale Code Status Info: DNR-LIMITED -Do Not Intubate/DNI Allergies Info: Morphine And Codeine, Penicillins, Cefepime , Statins   Insulin  Sliding Scale Info: Please see discharge summary Isolation Precautions Info: Contact Precautions (ESBL, MRSA)     Current Medications (12/06/2024):  This is the current hospital active medication list Current Facility-Administered Medications  Medication Dose Route Frequency Provider Last Rate Last Admin   acetaminophen  (TYLENOL ) tablet 650 mg  650 mg Per Tube Q6H PRN Opyd, Evalene  S, MD   650 mg at 12/06/24 0548   amantadine  (SYMMETREL ) 50 MG/5ML solution 100 mg  100 mg Per Tube Daily Opyd, Timothy S, MD   100 mg at 12/06/24 9060   Chlorhexidine  Gluconate Cloth 2 % PADS 6 each  6 each Topical Daily Opyd, Timothy S, MD   6 each at 12/06/24 9057   enoxaparin  (LOVENOX ) injection 40 mg  40 mg Subcutaneous Q24H Opyd, Timothy S, MD   40 mg at  12/06/24 0941   feeding supplement (GLUCERNA 1.5 CAL) liquid 1,000 mL  1,000 mL Per Tube Continuous Dibia, Landon BRAVO, MD 60 mL/hr at 12/05/24 1504 1,000 mL at 12/06/24 1253   free water  150 mL  150 mL Per Tube Q8H Dibia, Pauline E, MD   150 mL at 12/06/24 0600   gatifloxacin  (ZYMAXID ) 0.5 % ophthalmic drops 1 drop  1 drop Both Eyes QID Opyd, Timothy S, MD   1 drop at 12/06/24 1248   Gerhardt's butt cream   Topical TID Dibia, Pauline E, MD   Given at 12/06/24 9056   insulin  aspart (novoLOG ) injection 0-6 Units  0-6 Units Subcutaneous Q4H Opyd, Timothy S, MD   3 Units at 12/06/24 1241   insulin  aspart (novoLOG ) injection 3 Units  3 Units Subcutaneous Q4H Dibia, Pauline E, MD   3 Units at 12/06/24 1241   insulin  glargine (LANTUS ) injection 15 Units  15 Units Subcutaneous QHS Opyd, Timothy S, MD   15 Units at 12/05/24 2149   levETIRAcetam  (KEPPRA ) tablet 500 mg  500 mg Per Tube BID Opyd, Timothy S, MD   500 mg at 12/06/24 9066   levothyroxine  (SYNTHROID ) tablet 100 mcg  100 mcg Per Tube Daily Opyd, Timothy S, MD   100 mcg at 12/06/24 0548   liothyronine  (CYTOMEL ) tablet 7.5 mcg  7.5 mcg Per Tube Q12H Opyd, Timothy S, MD   7.5 mcg at 12/06/24 0934   meropenem  (MERREM ) 1 g in sodium chloride  0.9 % 100 mL IVPB  1 g Intravenous Q8H Cyler, Kappes, RPH 200 mL/hr at 12/06/24 1122 1 g at 12/06/24 1122   midodrine  (PROAMATINE ) tablet 10 mg  10 mg Per Tube TID WC Opyd, Timothy S, MD   10 mg at 12/06/24 1243   modafinil  (PROVIGIL ) tablet 100 mg  100 mg Per Tube Daily Opyd, Timothy S, MD   100 mg at 12/06/24 9065   mupirocin  ointment (BACTROBAN ) 2 % 1 Application  1 Application Nasal BID Dibia, Pauline E, MD   1 Application at 12/06/24 9057   omeprazole  (KONVOMEP ) 2 mg/mL oral suspension 40 mg  40 mg Per Tube Q1200 Opyd, Evalene RAMAN, MD   40 mg at 12/05/24 1258   ondansetron  (ZOFRAN ) tablet 4 mg  4 mg Oral Q6H PRN Opyd, Timothy S, MD       Or   ondansetron  (ZOFRAN ) injection 4 mg  4 mg Intravenous Q6H PRN Opyd,  Timothy S, MD       polyethylene glycol (MIRALAX  / GLYCOLAX ) packet 17 g  17 g Per Tube BID Opyd, Timothy S, MD   17 g at 12/05/24 0827   potassium & sodium phosphates  (PHOS-NAK) 280-160-250 MG packet 2 packet  2 packet Per Tube Q4H Dibia, Pauline E, MD       sertraline  (ZOLOFT ) tablet 25 mg  25 mg Per Tube Daily Opyd, Timothy S, MD   25 mg at 12/06/24 0933   sodium chloride  flush (NS) 0.9 % injection 3 mL  3 mL Intravenous Q12H  Opyd, Timothy S, MD   3 mL at 12/06/24 0943   sodium hypochlorite (DAKIN'S 1/4 STRENGTH) topical solution   Topical Daily Dibia, Pauline E, MD   Given at 12/05/24 1301   thiamine  (VITAMIN B1) tablet 100 mg  100 mg Per Tube Daily Dibia, Pauline E, MD   100 mg at 12/06/24 9066     Discharge Medications: Please see discharge summary for a list of discharge medications.  Relevant Imaging Results:  Relevant Lab Results:   Additional Information SSN: [id number]  Gracelin Weisberg E Trinitie Mcgirr, LCSWA

## 2024-12-06 NOTE — NC FL2 (Signed)
 [place]  MEDICAID FL2 LEVEL OF CARE FORM     IDENTIFICATION  Patient Name: Joshua Haley Birthdate: 1951/10/23 Sex: male Admission Date (Current Location): 12/03/2024  [facility] and [place] Number:  Producer, Television/film/video and Address:  [facility] [place], [address] [place], [place] [address]      Provider Number: [id number]  Attending Physician Name and Address:  Dibia, Landon BRAVO, MD  Relative Name and Phone Number:  [facility]; Spouse; [phone]    Current Level of Care: Hospital Recommended Level of Care: Skilled Nursing Facility Prior Approval Number:    Date Approved/Denied:   PASRR Number:    Discharge Plan: SNF    Current Diagnoses: Patient Active Problem List   Diagnosis Date Noted   Malnutrition of moderate degree 12/06/2024   Hypernatremia 12/04/2024   Pressure injury of skin 10/14/2024   Sepsis (HCC) 10/13/2024   Acute respiratory failure with hypoxia (HCC) 01/24/2023   Sepsis due to undetermined organism (HCC) 12/28/2022   Hospital-acquired pneumonia 12/28/2022   Moderate protein-calorie malnutrition 09/12/2022   Urethral bleeding 09/11/2022   Hyperglycemia 09/11/2022   Acute encephalopathy 09/11/2022   Sacral decubitus ulcer 09/11/2022   CAD (coronary artery disease) 09/11/2022   Septic shock (HCC) 09/10/2022   Gross hematuria    Aspiration into airway    Chronic respiratory failure with hypoxia (HCC)    Acute on chronic respiratory failure with hypoxia (HCC) 06/21/2022   Seizure disorder (HCC) 06/21/2022   Pneumonia 06/21/2022   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, sequela 06/21/2022   Tracheostomy status (HCC) 06/21/2022   T2DM (type 2 diabetes mellitus) (HCC) 01/20/2020   Nephrolithiasis 01/20/2020   Hypothyroidism 01/20/2020   GERD (gastroesophageal reflux disease) 01/20/2020   HTN (hypertension) 01/20/2020   Chest pain 01/20/2020    Orientation RESPIRATION BLADDER Height & Weight         O2, Tracheostomy (Trach Collar; 1L nasal cannula; 24% FiO2) Indwelling catheter, Incontinent Weight: 181 lb 10.5 oz (82.4 kg) (wt from 10/17/2024) Height:  6' (182.9 cm)  BEHAVIORAL SYMPTOMS/MOOD NEUROLOGICAL BOWEL NUTRITION STATUS    Convulsions/Seizures (History of seizures) Incontinent Diet (Please see discharge summary. 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)                       Personal Care Assistance Level of Assistance              Functional Limitations Info  Speech     Speech Info: Impaired (Trach collar)    SPECIAL CARE FACTORS FREQUENCY  Speech therapy             Speech Therapy Frequency: 3x      Contractures Contractures Info: Not present    Additional Factors Info  Code Status, Isolation Precautions, Allergies, Insulin  Sliding Scale Code Status Info: DNR-LIMITED -Do Not Intubate/DNI Allergies Info: Morphine And Codeine, Penicillins, Cefepime , Statins   Insulin  Sliding Scale Info: Please see discharge summary Isolation Precautions Info: Contact Precautions (ESBL, MRSA)     Current Medications (12/06/2024):  This is the current hospital active medication list Current Facility-Administered Medications  Medication Dose Route Frequency Provider Last Rate Last Admin   acetaminophen  (TYLENOL ) tablet 650 mg  650 mg Per Tube Q6H PRN Opyd,  Timothy S, MD   650 mg at 12/06/24 0548   amantadine  (SYMMETREL ) 50 MG/5ML solution 100 mg  100 mg Per Tube Daily Opyd, Timothy S, MD   100 mg at 12/06/24 9060   Chlorhexidine  Gluconate Cloth 2 % PADS 6 each  6 each Topical Daily Opyd, Timothy S, MD   6 each at 12/06/24 9057   enoxaparin  (LOVENOX ) injection 40 mg  40 mg Subcutaneous Q24H Opyd, Timothy S, MD   40 mg at 12/06/24 0941    feeding supplement (GLUCERNA 1.5 CAL) liquid 1,000 mL  1,000 mL Per Tube Continuous Dibia, Landon BRAVO, MD 60 mL/hr at 12/05/24 1504 1,000 mL at 12/06/24 1253   free water  150 mL  150 mL Per Tube Q8H Dibia, Pauline E, MD   150 mL at 12/06/24 0600   gatifloxacin  (ZYMAXID ) 0.5 % ophthalmic drops 1 drop  1 drop Both Eyes QID Opyd, Timothy S, MD   1 drop at 12/06/24 1248   Gerhardt's butt cream   Topical TID Dibia, Pauline E, MD   Given at 12/06/24 [phone]   insulin  aspart (novoLOG ) injection 0-6 Units  0-6 Units Subcutaneous Q4H Opyd, Timothy S, MD   3 Units at 12/06/24 1241   insulin  aspart (novoLOG ) injection 3 Units  3 Units Subcutaneous Q4H Dibia, Pauline E, MD   3 Units at 12/06/24 1241   insulin  glargine (LANTUS ) injection 15 Units  15 Units Subcutaneous QHS Opyd, Timothy S, MD   15 Units at 12/05/24 2149   levETIRAcetam  (KEPPRA ) tablet 500 mg  500 mg Per Tube BID Opyd, Timothy S, MD   500 mg at 12/06/24 9066   levothyroxine  (SYNTHROID ) tablet 100 mcg  100 mcg Per Tube Daily Opyd, Timothy S, MD   100 mcg at 12/06/24 0548   liothyronine  (CYTOMEL ) tablet 7.5 mcg  7.5 mcg Per Tube Q12H Opyd, Timothy S, MD   7.5 mcg at 12/06/24 0934   meropenem  (MERREM ) 1 g in sodium chloride  0.9 % 100 mL IVPB  1 g Intravenous Q8H Jaxsen, Bernhart, RPH 200 mL/hr at 12/06/24 1122 1 g at 12/06/24 1122   midodrine  (PROAMATINE ) tablet 10 mg  10 mg Per Tube TID WC Opyd, Evalene RAMAN, MD   10 mg at 12/06/24 1243   modafinil  (PROVIGIL ) tablet 100 mg  100 mg Per Tube Daily Opyd, Timothy S, MD   100 mg at 12/06/24 9065   mupirocin  ointment (BACTROBAN ) 2 % 1 Application  1 Application Nasal BID Dibia, Pauline E, MD   1 Application at 12/06/24 9057   omeprazole  (KONVOMEP ) 2 mg/mL oral suspension 40 mg  40 mg Per Tube Q1200 Opyd, Evalene RAMAN, MD   40 mg at 12/05/24 1258   ondansetron  (ZOFRAN ) tablet 4 mg  4 mg Oral Q6H PRN Opyd, Timothy S, MD       Or   ondansetron  (ZOFRAN ) injection 4 mg  4 mg Intravenous Q6H PRN Opyd, Timothy S, MD        polyethylene glycol (MIRALAX  / GLYCOLAX ) packet 17 g  17 g Per Tube BID Opyd, Timothy S, MD   17 g at 12/05/24 0827   potassium & sodium phosphates  (PHOS-NAK) 280-160-250 MG packet 2 packet  2 packet Per Tube Q4H Dibia, Pauline E, MD       sertraline  (ZOLOFT ) tablet 25 mg  25 mg Per Tube Daily Opyd, Timothy S, MD   25 mg at 12/06/24 0933   sodium chloride  flush (NS) 0.9 % injection 3 mL  3 mL Intravenous  Q12H Opyd, Timothy S, MD   3 mL at 12/06/24 0943   sodium hypochlorite (DAKIN'S 1/4 STRENGTH) topical solution   Topical Daily Dibia, Pauline E, MD   Given at 12/05/24 1301   thiamine  (VITAMIN B1) tablet 100 mg  100 mg Per Tube Daily Dibia, Pauline E, MD   100 mg at 12/06/24 9066     Discharge Medications: Please see discharge summary for a list of discharge medications.  Relevant Imaging Results:  Relevant Lab Results:   Additional Information SSN: [id number]  [place] E Murrell [place], LCSWA

## 2024-12-06 NOTE — Plan of Care (Signed)
°  Problem: Fluid Volume: Goal: Hemodynamic stability will improve Outcome: Progressing   Problem: Clinical Measurements: Goal: Diagnostic test results will improve Outcome: Progressing Goal: Signs and symptoms of infection will decrease Outcome: Progressing   Problem: Respiratory: Goal: Ability to maintain adequate ventilation will improve Outcome: Progressing   Problem: Education: Goal: Ability to describe self-care measures that may prevent or decrease complications (Diabetes Survival Skills Education) will improve Outcome: Not Applicable Goal: Individualized Educational Video(s) Outcome: Not Applicable   Problem: Coping: Goal: Ability to adjust to condition or change in health will improve Outcome: Not Applicable   Problem: Fluid Volume: Goal: Ability to maintain a balanced intake and output will improve Outcome: Progressing   Problem: Health Behavior/Discharge Planning: Goal: Ability to identify and utilize available resources and services will improve Outcome: Not Applicable Goal: Ability to manage health-related needs will improve Outcome: Not Applicable   Problem: Metabolic: Goal: Ability to maintain appropriate glucose levels will improve Outcome: Progressing   Problem: Nutritional: Goal: Maintenance of adequate nutrition will improve Outcome: Progressing Goal: Progress toward achieving an optimal weight will improve Outcome: Progressing   Problem: Skin Integrity: Goal: Risk for impaired skin integrity will decrease Outcome: Progressing   Problem: Tissue Perfusion: Goal: Adequacy of tissue perfusion will improve Outcome: Progressing   Problem: Education: Goal: Knowledge of General Education information will improve Description: Including pain rating scale, medication(s)/side effects and non-pharmacologic comfort measures Outcome: Progressing   Problem: Health Behavior/Discharge Planning: Goal: Ability to manage health-related needs will  improve Outcome: Not Applicable   Problem: Clinical Measurements: Goal: Ability to maintain clinical measurements within normal limits will improve Outcome: Progressing Goal: Will remain free from infection Outcome: Progressing Goal: Diagnostic test results will improve Outcome: Progressing Goal: Respiratory complications will improve Outcome: Progressing Goal: Cardiovascular complication will be avoided Outcome: Progressing   Problem: Activity: Goal: Risk for activity intolerance will decrease Outcome: Progressing   Problem: Nutrition: Goal: Adequate nutrition will be maintained Outcome: Progressing   Problem: Coping: Goal: Level of anxiety will decrease Outcome: Progressing   Problem: Elimination: Goal: Will not experience complications related to bowel motility Outcome: Progressing Goal: Will not experience complications related to urinary retention Outcome: Progressing   Problem: Pain Managment: Goal: General experience of comfort will improve and/or be controlled Outcome: Progressing   Problem: Safety: Goal: Ability to remain free from injury will improve Outcome: Progressing   Problem: Skin Integrity: Goal: Risk for impaired skin integrity will decrease Outcome: Progressing

## 2024-12-06 NOTE — Progress Notes (Addendum)
 Consultation Progress Note   Patient: Joshua Haley FMW:[id number] DOB: Apr 27, 1951 DOA: 12/03/2024 DOS: the patient was seen and examined on 12/06/2024 Primary service: DibiaLandon BRAVO, MD  Brief hospital course: 73 y.o. male with medical history significant for TBI with aphasia and hemiparesis, tracheostomy and G-tube dependence, CAD, type 2 diabetes mellitus, chronic hypotension, history of ESBL infections, Pseudomonas in sputum, and chronic 1 L/min supplemental oxygen requirement who presents from [facility] with concerns for sepsis.  Patient is nonverbal at baseline, per chart review patient was febrile with tachycardia, tachypnea and hyperglycemic reported to be vomiting at the long-term facility.  CT of the chest showed right lower lobe pneumonia, large stool burden and unchanged pressure ulcers without acute osteomyelitis. Received IV hydration, and started on broad-spectrum antibiotics including vancomycin , cefepime  and Flagyl .  Assessment and Plan: 1.Severe Sepsis suspected secondary to aspiration pneumonia, possible UTI  - Patient presented with sepsis and elevated lactate,  started on IV antibiotics and IV fluids.   Lactate normalized post 30 cc/kg. Previous history of Pseudomonas in sputum and ESBL infections Urine culture growing Klebsiella pneumonia, E. Coli Cultures show no growth in 1 day MRSA PCR positive Continue, Merrem  Leukocytosis resolved afebrile Monitor clinical course.   2. Tracheostomy status; acute on chronic hypoxic respiratory failure  - Baseline patient uses 1 L/min of supplemental oxygen, upon admission he required 5 L currently this has been weaned down to 2 L. - Continue tracheostomy care, supplemental O2 as-needed     3. Hypernatremia-improved  - Currently at 143, 4. Hx of TBI  - Continue supportive care, tube care, modafinil , sertraline      5. Pressure wounds  Patient with multiple unstageable pressure injury. L hip unstageable presssure injury L heel  unstageable pressure injury, Sacral stage 4 pressure injury L ischium healing pressure injury, R trochanter stage 3 pressure injury Full thickness wound to R frontal LE. Full thickness wound to top of head-  - Present on arrival, no acute osteomyelitis on CT  - Continue wound care     6. Type II DM  - A1c was 7.7% in October 2025  - Check CBGs, use basal and correctional insulin   Monitor BG closely   7. Hypothyroidism  - Continue Synthroid  and Cytomel     8. Normocytic Anemia Hb dropped from 10.7 to 8.8 Likely hemodiluution in setting of IV hydration Monitor Hb levels Transfuse if less than 7        TRH will continue to follow the patient.  Subjective: Patient seen at bedside this morning, he opened his eyes with mild to sternal rub. Baseline patient is nonverbal, spoke with RN who states that there was no acute overnight events.  Physical Exam: Vitals:   12/06/24 0000 12/06/24 0326 12/06/24 0400 12/06/24 0800  BP: (!) 113/58  (!) 113/56 (!) 113/59  Pulse: 81 79 81 76  Resp: 20 19 17 18   Temp: 98.3 F (36.8 C)  99 F (37.2 C) 98.2 F (36.8 C)  TempSrc: Oral  Axillary Oral  SpO2: 96% 96% 97% 97%  Weight:      Height:       General   Lethargic Eyes: PERRL, lids and conjunctivae normal ENMT: Mucous membranes are moist.   Neck: normal, supple, no masses, no thyromegaly Respiratory: clear to auscultation bilaterally, no wheezing, no crackles. Normal respiratory effort. No accessory muscle use.  Cardiovascular: Regular rate and rhythm, no murmurs / rubs / gallops Abdomen: Soft, nontender nondistended. Musculoskeletal: no clubbing / cyanosis. No joint deformity upper and  lower extremities.  Skin: no rashes, lesions, ulcers. No induration Neurologic: Left sided contracture, right-sided weakness.  Nonverbal.  Opens eyes to pain.   Data Reviewed:  CBC    Component Value Date/Time   WBC 5.6 12/06/2024 0436   RBC 3.22 (L) 12/06/2024 0436   HGB 8.8 (L) 12/06/2024 0436    HCT 29.5 (L) 12/06/2024 0436   PLT 326 12/06/2024 0436   MCV 91.6 12/06/2024 0436   MCH 27.3 12/06/2024 0436   MCHC 29.8 (L) 12/06/2024 0436   RDW 18.2 (H) 12/06/2024 0436   LYMPHSABS 1.1 12/03/2024 2215   MONOABS 0.7 12/03/2024 2215   EOSABS 0.1 12/03/2024 2215   BASOSABS 0.1 12/03/2024 2215   CMP     Component Value Date/Time   NA 143 12/06/2024 0645   K 3.7 12/06/2024 0645   CL 106 12/06/2024 0645   CO2 30 12/06/2024 0645   GLUCOSE 302 (H) 12/06/2024 0645   BUN 26 (H) 12/06/2024 0645   CREATININE 0.55 (L) 12/06/2024 0645   CALCIUM 8.4 (L) 12/06/2024 0645   PROT 8.5 (H) 12/03/2024 2215   ALBUMIN 2.3 (L) 12/03/2024 2215   AST 19 12/03/2024 2215   ALT 13 12/03/2024 2215   ALKPHOS 105 12/03/2024 2215   BILITOT 0.6 12/03/2024 2215   GFRNONAA >60 12/06/2024 0645     Family Communication: Spoke with wife Mrs. Hope Mccaskill,[phone] updated her on patients clinical progress, answered all her questions.  Time spent: 35 minutes.  Author: Landon FORBES Baller, MD 12/06/2024 10:25 AM  For on call review [email or web address].

## 2024-12-06 NOTE — Inpatient Diabetes Management (Incomplete)
 Inpatient Diabetes Program Recommendations  AACE/ADA: New Consensus Statement on Inpatient Glycemic Control (2015)  Target Ranges:  Prepandial:   less than 140 mg/dL      Peak postprandial:   less than 180 mg/dL (1-2 hours)      Critically ill patients:  140 - 180 mg/dL   Lab Results  Component Value Date   GLUCAP 283 (H) 12/06/2024   HGBA1C 7.7 (H) 10/13/2024    Review of Glycemic Control  Latest Reference Range & Units 12/05/24 16:30 12/05/24 19:27 12/05/24 23:48 12/06/24 04:02 12/06/24 08:22  Glucose-Capillary 70 - 99 mg/dL 715 (H) 719 (H) 666 (H) 282 (H) 283 (H)  (H): Data is abnormally high  Diabetes history: DM2 Outpatient Diabetes medications: Semglee  25 units every day, Fiasp  0-15 units Q6H Current orders for Inpatient glycemic control: Lantus  15 units every day, Novolog  0-6 units Q4H, Glucernia 1.5 @ 60ml/hr  Inpatient Diabetes Program Recommendations:    Please consider:  Novolog  0-9 units Q4H Novolog  3 units Q4H tube feeds coverage (hold if feeds are held or discontinued)  Thank you, Wyvonna Pinal, MSN, CDCES Diabetes Coordinator Inpatient Diabetes Program [phone] (team pager from 8a-5p)

## 2024-12-07 DIAGNOSIS — J189 Pneumonia, unspecified organism: Secondary | ICD-10-CM

## 2024-12-07 DIAGNOSIS — E440 Moderate protein-calorie malnutrition: Secondary | ICD-10-CM

## 2024-12-07 DIAGNOSIS — A419 Sepsis, unspecified organism: Secondary | ICD-10-CM

## 2024-12-07 LAB — BASIC METABOLIC PANEL WITH GFR
Anion gap: 9 (ref 5–15)
Anion gap: 9 (ref 5–15)
BUN: 22 mg/dL (ref 8–23)
BUN: 22 mg/dL (ref 8–23)
CO2: 26 mmol/L (ref 22–32)
CO2: 27 mmol/L (ref 22–32)
Calcium: 8.3 mg/dL — ABNORMAL LOW (ref 8.9–10.3)
Calcium: 8.5 mg/dL — ABNORMAL LOW (ref 8.9–10.3)
Chloride: 106 mmol/L (ref 98–111)
Chloride: 105 mmol/L (ref 98–111)
Creatinine, Ser: 0.53 mg/dL — ABNORMAL LOW (ref 0.61–1.24)
Creatinine, Ser: 0.47 mg/dL — ABNORMAL LOW (ref 0.61–1.24)
GFR, Estimated: >60 mL/min (ref >60)
GFR, Estimated: >60 mL/min (ref >60)
Glucose, Bld: 149 mg/dL — ABNORMAL HIGH (ref 70–99)
Glucose, Bld: 132 mg/dL — ABNORMAL HIGH (ref 70–99)
Potassium: 4.5 mmol/L (ref 3.5–5.1)
Potassium: 4.1 mmol/L (ref 3.5–5.1)
Sodium: 141 mmol/L (ref 135–145)
Sodium: 141 mmol/L (ref 135–145)

## 2024-12-07 LAB — GLUCOSE, CAPILLARY
Glucose-Capillary: 127 mg/dL — ABNORMAL HIGH (ref 70–99)
Glucose-Capillary: 144 mg/dL — ABNORMAL HIGH (ref 70–99)
Glucose-Capillary: 152 mg/dL — ABNORMAL HIGH (ref 70–99)
Glucose-Capillary: 157 mg/dL — ABNORMAL HIGH (ref 70–99)
Glucose-Capillary: 169 mg/dL — ABNORMAL HIGH (ref 70–99)
Glucose-Capillary: 172 mg/dL — ABNORMAL HIGH (ref 70–99)

## 2024-12-07 LAB — CBC
HCT: 31.4 % — ABNORMAL LOW (ref 39.0–52.0)
Hemoglobin: 9.4 g/dL — ABNORMAL LOW (ref 13.0–17.0)
MCH: 26.9 pg (ref 26.0–34.0)
MCHC: 29.9 g/dL — ABNORMAL LOW (ref 30.0–36.0)
MCV: 90 fL (ref 80.0–100.0)
Platelets: 372 K/uL (ref 150–400)
RBC: 3.49 MIL/uL — ABNORMAL LOW (ref 4.22–5.81)
RDW: 18 % — ABNORMAL HIGH (ref 11.5–15.5)
WBC: 6.6 K/uL (ref 4.0–10.5)
nRBC: 0 % (ref 0.0–0.2)

## 2024-12-07 LAB — PHOSPHORUS: Phosphorus: 3.2 mg/dL (ref 2.5–4.6)

## 2024-12-07 LAB — MAGNESIUM: Magnesium: 2.2 mg/dL (ref 1.7–2.4)

## 2024-12-07 NOTE — Plan of Care (Signed)
°  Problem: Clinical Measurements: Goal: Signs and symptoms of infection will decrease Outcome: Progressing   Problem: Respiratory: Goal: Ability to maintain adequate ventilation will improve Outcome: Progressing   Problem: Metabolic: Goal: Ability to maintain appropriate glucose levels will improve Outcome: Progressing

## 2024-12-07 NOTE — TOC Progression Note (Signed)
 Transition of Care [facility]) - Progression Note    Patient Details  Name: Joshua Haley MRN: [id number] Date of Birth: 06-Oct-1951  [facility] CM/SW Contact  Lauraine FORBES Saa, LCSWA Phone Number: 12/07/2024, 4:07 PM  Clinical Narrative:     4:07 PM Per chart review, patient is anticipated to discharge in 24-48 hours. CSW made [facility] SN[facility] aware. CSW will continue to follow.  Expected Discharge Plan: Long Term Nursing Home Barriers to Discharge: Continued Medical Work up               Expected Discharge Plan and Services In-house Referral: Clinical Social Work   Post Acute Care Choice: Skilled Nursing Facility Living arrangements for the past 2 months: Skilled Nursing Facility                                       Social Drivers of Health (SDOH) Interventions SDOH Screenings   Food Insecurity: Patient Unable To Answer (10/15/2024)  Housing: Patient Unable To Answer (10/15/2024)  Transportation Needs: Patient Unable To Answer (10/15/2024)  Utilities: Patient Unable To Answer (10/15/2024)  Social Connections: Patient Unable To Answer (10/15/2024)  Tobacco Use: Medium Risk (12/04/2024)    Readmission Risk Interventions    10/16/2024    4:35 PM  Readmission Risk Prevention Plan  Transportation Screening Complete  PCP or Specialist Appt within 5-7 Days Complete  Home Care Screening Complete  Medication Review (RN CM) Complete

## 2024-12-07 NOTE — Progress Notes (Signed)
 Nutrition Brief Note  Patient continues with tube feeds running at goal rate. Per RN, tolerating well with two BMs noted yesterday. No N/V or abdominal distention reported. Sodium trends improved/stabilizing. Continues on FWF q8h.  Refeeding labs stable. Did required two doses of PHOS-NAK on 12/10. Unable to assess weight trend due to patient on air mattress.    INTERVENTION:  - Continue tube feeding via PEG tube: Glucerna 1.5 at 60 ml/h (1440 ml per day) Add FWF 150ml q8h (450 ml per day) Provides 2160 kcal, 119 gm protein, 1092 ml free water  daily (1543 ml total per day FWF+TF)    -Continue to monitor electrolytes and replete as needed   -Continue thiamine  100mg  daily per tube    -Consider addition of vitamin C  supplement, Juven BID to support wound healing     NUTRITION DIAGNOSIS:  Moderate Malnutrition related to chronic illness as evidenced by mild fat depletion, moderate muscle depletion.   GOAL:  Patient will meet greater than or equal to 90% of their needs  Joshua Deaner MS, RD, LDN Registered Dietitian Clinical Nutrition RD Inpatient Contact Info in Amion

## 2024-12-07 NOTE — Plan of Care (Signed)
°  Problem: Fluid Volume: Goal: Hemodynamic stability will improve Outcome: Progressing   Problem: Clinical Measurements: Goal: Diagnostic test results will improve Outcome: Progressing Goal: Signs and symptoms of infection will decrease Outcome: Progressing   Problem: Respiratory: Goal: Ability to maintain adequate ventilation will improve Outcome: Progressing   Problem: Fluid Volume: Goal: Ability to maintain a balanced intake and output will improve Outcome: Progressing   Problem: Metabolic: Goal: Ability to maintain appropriate glucose levels will improve Outcome: Progressing   Problem: Nutritional: Goal: Maintenance of adequate nutrition will improve Outcome: Progressing Goal: Progress toward achieving an optimal weight will improve Outcome: Progressing   Problem: Skin Integrity: Goal: Risk for impaired skin integrity will decrease Outcome: Progressing   Problem: Tissue Perfusion: Goal: Adequacy of tissue perfusion will improve Outcome: Progressing   Problem: Education: Goal: Knowledge of General Education information will improve Description: Including pain rating scale, medication(s)/side effects and non-pharmacologic comfort measures Outcome: Progressing   Problem: Clinical Measurements: Goal: Ability to maintain clinical measurements within normal limits will improve Outcome: Progressing Goal: Will remain free from infection Outcome: Progressing Goal: Diagnostic test results will improve Outcome: Progressing Goal: Respiratory complications will improve Outcome: Progressing Goal: Cardiovascular complication will be avoided Outcome: Progressing   Problem: Activity: Goal: Risk for activity intolerance will decrease Outcome: Progressing   Problem: Nutrition: Goal: Adequate nutrition will be maintained Outcome: Progressing   Problem: Coping: Goal: Level of anxiety will decrease Outcome: Progressing   Problem: Elimination: Goal: Will not experience  complications related to bowel motility Outcome: Progressing Goal: Will not experience complications related to urinary retention Outcome: Progressing   Problem: Pain Managment: Goal: General experience of comfort will improve and/or be controlled Outcome: Progressing

## 2024-12-07 NOTE — Progress Notes (Signed)
 PROGRESS NOTE    Joshua Haley  FMW:[id number] DOB: 11-23-1951 DOA: 12/03/2024 PCP: Fleeta Valeria Mayo, MD   Brief Narrative:  73 y.o. male with medical history significant for TBI with aphasia and hemiparesis, tracheostomy and G-tube dependence, CAD, type 2 diabetes mellitus, chronic hypotension, history of ESBL infections, Pseudomonas in sputum, and chronic 1 L/min supplemental oxygen requirement who presents from [facility] with concerns for sepsis.   Patient is nonverbal at baseline, patient found to be febrile, tachycardic, tachypneic at facility, imaging concerning for right lower lobe pneumonia, likely aspiration as well as large stool burden and notable  pressure ulcers that do not appear to be acutely infected.  No osteomyelitis noted on imaging.    Assessment & Plan:   Principal Problem:   Sepsis (HCC) Active Problems:   T2DM (type 2 diabetes mellitus) (HCC)   Hypothyroidism   Acute on chronic respiratory failure with hypoxia (HCC)   Pneumonia   Tracheostomy status (HCC)   CAD (coronary artery disease)   Pressure injury of skin   Hypernatremia   Malnutrition of moderate degree   Severe Sepsis secondary to aspiration pneumonia Cannot rule out concurrent UTI  - Patient presented with sepsis and elevated lactic acid - Continue IV antibiotics -plan for 5-day course, stop date 12/13 - Prior Pseudomonas/ESBL positive cultures - Urine culture growing Klebsiella and E. Coli -both >100k - Continue meropenem  - Chronically on midodrine , no change in dose  Acute on chronic hypoxic respiratory failure  Tracheostomy intact - Baseline patient uses 1 L/min of supplemental oxygen, upon admission he required 5 L currently this has been weaned down to 2 L. - Continue tracheostomy care, supplemental O2 as-needed  -wean as tolerated   Hypernatremia-improved  - Likely hypovolemic hyponatremia, improving with initiation of tube feeds and IV fluids  Hx of TBI  - Continue supportive care,  tube care, modafinil , sertraline      Pressure wounds, numerous, POA Patient with multiple unstageable pressure injury. L hip unstageable presssure injury L heel unstageable pressure injury, Sacral stage 4 pressure injury L ischium healing pressure injury, R trochanter stage 3 pressure injury Full thickness wound to R frontal LE. Full thickness wound to top of head-  - Present on arrival, no acute osteomyelitis on CT  - Continue wound care     Type II DM, well-controlled - A1c was 7.7% in October 2025  - Check CBGs, use basal and correctional insulin ; continue hypoglycemic protocol   Hypothyroidism - Continue Synthroid  and Cytomel     Normocytic Anemia Hemodilutional, stabilizing around 9 -has baseline  DVT prophylaxis: enoxaparin  (LOVENOX ) injection 40 mg Start: 12/04/24 1000 Code Status:   Code Status: Limited: Do not attempt resuscitation (DNR) -DNR-LIMITED -Do Not Intubate/DNI  Family Communication: None presents  Status is: Inpatient  Dispo: The patient is from: [facility]              Anticipated d/c is to: Same              Anticipated d/c date is: 24 to 48 hours              Patient currently not medically stable for discharge  Consultants:  None  Procedures:  None  Antimicrobials:  Unasyn  Subjective: No acute issues or events reported overnight, review of systems markedly limited by patient's baseline  Objective: Vitals:   12/06/24 2000 12/06/24 2313 12/07/24 0050 12/07/24 0312  BP:  126/60  107/65  Pulse: 81 75 80 81  Resp: (!) 23 (!) 21 20  17  Temp:  98.2 F (36.8 C)  98.4 F (36.9 C)  TempSrc:  Axillary  Axillary  SpO2: 95% 96% 96% 94%  Weight:      Height:        Intake/Output Summary (Last 24 hours) at 12/07/2024 0735 Last data filed at 12/07/2024 0617 Gross per 24 hour  Intake 1781 ml  Output 2200 ml  Net -419 ml   Filed Weights   12/03/24 2211  Weight: 82.4 kg    Examination:  General: Resting comfortably in no acute distress,  responsive to tactile stimuli. HEENT:  Normocephalic -sclera without injection or icterus, tracheostomy noted Without airleak exudate erythema or bleeding Lungs: Coarse breath sounds diffusely, rhonchi noted right lower lobe, scant Heart:  Regular rate and rhythm.  Without murmurs, rubs, or gallops. Abdomen: Feeding tube clean dry intact without erythema bleeding.. Extremities: LUE contracture noted, diffuse weakness 2/5 globally when participating Skin: Multiple lesions noted: Wound 10/13/24 1200 Pressure Injury Coccyx Medial;Left;Right Stage 4 - Full thickness tissue loss with exposed bone, tendon or muscle. (Active)     Wound 10/13/24 1336 Pressure Injury Hip Anterior;Left;Proximal Stage 4 - Full thickness tissue loss with exposed bone, tendon or muscle. (Active)     Wound 12/04/24 2120 Pressure Injury Heel Left;Posterior Unstageable - Full thickness tissue loss in which the base of the injury is covered by slough (yellow, tan, gray, green or brown) and/or eschar (tan, brown or black) in the wound bed. (Active)     Wound 12/04/24 2120 Pressure Injury Ischial tuberosity Left Stage 3 -  Full thickness tissue loss. Subcutaneous fat may be visible but bone, tendon or muscle are NOT exposed. (Active)   Data Reviewed: I have personally reviewed following labs and imaging studies  CBC: Recent Labs  Lab 12/03/24 2215 12/03/24 2226 12/04/24 0241 12/05/24 0646 12/06/24 0436 12/06/24 1815 12/07/24 0215  WBC 14.4*  --  10.7* 7.2 5.6  --  6.6  NEUTROABS 12.4*  --   --   --   --   --   --   HGB 10.5*   < > 8.9* 10.7* 8.8* 9.2* 9.4*  HCT 38.1*   < > 30.9* 36.8* 29.5* 30.2* 31.4*  MCV 94.5  --  93.4 93.4 91.6  --  90.0  PLT 396  --  341 314 326  --  372   < > = values in this interval not displayed.   Basic Metabolic Panel: Recent Labs  Lab 12/04/24 0241 12/05/24 0244 12/05/24 2253 12/06/24 0436 12/06/24 0645 12/06/24 1815 12/07/24 0215  NA 149* 148* 144  --  143 141  --   K 4.0 4.0  3.8  --  3.7 4.1  --   CL 106 111 108  --  106 103  --   CO2 32 24 26  --  30 29  --   GLUCOSE 362* 173* 389*  --  302* 226*  --   BUN 43* 32* 26*  --  26* 25*  --   CREATININE 0.83 0.86 0.70  --  0.55* 0.46*  --   CALCIUM 8.9 8.9 8.3*  --  8.4* 8.3*  --   MG  --   --   --  2.1  --   --  2.2  PHOS  --   --   --  1.8*  --   --  3.2   GFR: Estimated Creatinine Clearance: 91.6 mL/min (A) (by C-G formula based on SCr of 0.46 mg/dL (L)). Liver  Function Tests: Recent Labs  Lab 12/03/24 2215  AST 19  ALT 13  ALKPHOS 105  BILITOT 0.6  PROT 8.5*  ALBUMIN 2.3*   No results for input(s): LIPASE, AMYLASE in the last 168 hours. No results for input(s): AMMONIA in the last 168 hours. Coagulation Profile: Recent Labs  Lab 12/03/24 2215  INR 1.1   Cardiac Enzymes: No results for input(s): CKTOTAL, CKMB, CKMBINDEX, TROPONINI in the last 168 hours. BNP (last 3 results) No results for input(s): PROBNP in the last 8760 hours. HbA1C: No results for input(s): HGBA1C in the last 72 hours. CBG: Recent Labs  Lab 12/06/24 1217 12/06/24 1631 12/06/24 1952 12/07/24 0000 12/07/24 0400  GLUCAP 272* 230* 192* 169* 144*   Lipid Profile: No results for input(s): CHOL, HDL, LDLCALC, TRIG, CHOLHDL, LDLDIRECT in the last 72 hours. Thyroid Function Tests: No results for input(s): TSH, T4TOTAL, FREET4, T3FREE, THYROIDAB in the last 72 hours. Anemia Panel: No results for input(s): VITAMINB12, FOLATE, FERRITIN, TIBC, IRON , RETICCTPCT in the last 72 hours. Sepsis Labs: Recent Labs  Lab 12/03/24 2231 12/04/24 0044 12/04/24 0254  LATICACIDVEN 2.2* 3.3* 1.3    Recent Results (from the past 240 hours)  Culture, blood (Routine x 2)     Status: None (Preliminary result)   Collection Time: 12/03/24 10:10 PM   Specimen: BLOOD RIGHT ARM  Result Value Ref Range Status   Specimen Description BLOOD RIGHT ARM  Final   Special Requests   Final     BOTTLES DRAWN AEROBIC AND ANAEROBIC Blood Culture results may not be optimal due to an inadequate volume of blood received in culture bottles   Culture   Final    NO GROWTH 2 DAYS Performed at [facility], [address] [place], [place] [address]    Report Status PENDING  Incomplete  Culture, blood (Routine x 2)     Status: None (Preliminary result)   Collection Time: 12/03/24 10:11 PM   Specimen: BLOOD LEFT FOREARM  Result Value Ref Range Status   Specimen Description BLOOD LEFT FOREARM  Final   Special Requests   Final    BOTTLES DRAWN AEROBIC AND ANAEROBIC Blood Culture adequate volume   Culture   Final    NO GROWTH 2 DAYS Performed at [facility], [address], [place], [place] [address]    Report Status PENDING  Incomplete  Urine Culture     Status: Abnormal   Collection Time: 12/03/24 10:15 PM   Specimen: Urine, Random  Result Value Ref Range Status   Specimen Description URINE, RANDOM  Final   Special Requests   Final    NONE Reflexed from [id number] Performed at [facility], [address] [place], [place] [address]    Culture (A)  Final    >=100,000 COLONIES/mL KLEBSIELLA PNEUMONIAE >=100,000 COLONIES/mL ESCHERICHIA COLI Confirmed Extended Spectrum Beta-Lactamase Producer (ESBL).  In bloodstream infections from ESBL organisms, carbapenems are preferred over piperacillin/tazobactam. They are shown to have a lower risk of mortality.    Report Status 12/06/2024 FINAL  Final   Organism ID, Bacteria KLEBSIELLA PNEUMONIAE (A)  Final   Organism ID, Bacteria ESCHERICHIA COLI (A)  Final      Susceptibility   Escherichia coli - MIC*    AMPICILLIN >=32 RESISTANT Resistant     CEFAZOLIN (URINE) Value in next row Resistant      >=32 RESISTANTThis is a modified FDA-approved test that has been validated and its performance characteristics determined by the reporting  laboratory.  This laboratory is certified under the Clinical Laboratory Improvement Amendments  CLIA as qualified to perform high complexity clinical laboratory testing.    CEFEPIME  Value in next row Resistant      >=32 RESISTANTThis is a modified FDA-approved test that has been validated and its performance characteristics determined by the reporting laboratory.  This laboratory is certified under the Clinical Laboratory Improvement Amendments CLIA as qualified to perform high complexity clinical laboratory testing.    ERTAPENEM Value in next row Sensitive      >=32 RESISTANTThis is a modified FDA-approved test that has been validated and its performance characteristics determined by the reporting laboratory.  This laboratory is certified under the Clinical Laboratory Improvement Amendments CLIA as qualified to perform high complexity clinical laboratory testing.    CEFTRIAXONE  Value in next row Resistant      >=32 RESISTANTThis is a modified FDA-approved test that has been validated and its performance characteristics determined by the reporting laboratory.  This laboratory is certified under the Clinical Laboratory Improvement Amendments CLIA as qualified to perform high complexity clinical laboratory testing.    CIPROFLOXACIN Value in next row Resistant      >=32 RESISTANTThis is a modified FDA-approved test that has been validated and its performance characteristics determined by the reporting laboratory.  This laboratory is certified under the Clinical Laboratory Improvement Amendments CLIA as qualified to perform high complexity clinical laboratory testing.    GENTAMICIN Value in next row Sensitive      >=32 RESISTANTThis is a modified FDA-approved test that has been validated and its performance characteristics determined by the reporting laboratory.  This laboratory is certified under the Clinical Laboratory Improvement Amendments CLIA as qualified to perform high complexity clinical laboratory testing.    NITROFURANTOIN Value in next row Sensitive      >=32 RESISTANTThis is a modified  FDA-approved test that has been validated and its performance characteristics determined by the reporting laboratory.  This laboratory is certified under the Clinical Laboratory Improvement Amendments CLIA as qualified to perform high complexity clinical laboratory testing.    TRIMETH /SULFA  Value in next row Sensitive      >=32 RESISTANTThis is a modified FDA-approved test that has been validated and its performance characteristics determined by the reporting laboratory.  This laboratory is certified under the Clinical Laboratory Improvement Amendments CLIA as qualified to perform high complexity clinical laboratory testing.    AMPICILLIN/SULBACTAM Value in next row Intermediate      >=32 RESISTANTThis is a modified FDA-approved test that has been validated and its performance characteristics determined by the reporting laboratory.  This laboratory is certified under the Clinical Laboratory Improvement Amendments CLIA as qualified to perform high complexity clinical laboratory testing.    PIP/TAZO Value in next row Sensitive      16 SENSITIVEThis is a modified FDA-approved test that has been validated and its performance characteristics determined by the reporting laboratory.  This laboratory is certified under the Clinical Laboratory Improvement Amendments CLIA as qualified to perform high complexity clinical laboratory testing.    MEROPENEM  Value in next row Sensitive      16 SENSITIVEThis is a modified FDA-approved test that has been validated and its performance characteristics determined by the reporting laboratory.  This laboratory is certified under the Clinical Laboratory Improvement Amendments CLIA as qualified to perform high complexity clinical laboratory testing.    * >=100,000 COLONIES/mL ESCHERICHIA COLI   Klebsiella pneumoniae - MIC*    AMPICILLIN Value in next row Resistant  16 SENSITIVEThis is a modified FDA-approved test that has been validated and its performance characteristics  determined by the reporting laboratory.  This laboratory is certified under the Clinical Laboratory Improvement Amendments CLIA as qualified to perform high complexity clinical laboratory testing.    CEFAZOLIN (URINE) Value in next row Resistant      >=32 RESISTANTThis is a modified FDA-approved test that has been validated and its performance characteristics determined by the reporting laboratory.  This laboratory is certified under the Clinical Laboratory Improvement Amendments CLIA as qualified to perform high complexity clinical laboratory testing.    CEFEPIME  Value in next row Resistant      >=32 RESISTANTThis is a modified FDA-approved test that has been validated and its performance characteristics determined by the reporting laboratory.  This laboratory is certified under the Clinical Laboratory Improvement Amendments CLIA as qualified to perform high complexity clinical laboratory testing.    ERTAPENEM Value in next row Sensitive      >=32 RESISTANTThis is a modified FDA-approved test that has been validated and its performance characteristics determined by the reporting laboratory.  This laboratory is certified under the Clinical Laboratory Improvement Amendments CLIA as qualified to perform high complexity clinical laboratory testing.    CEFTRIAXONE  Value in next row Resistant      >=32 RESISTANTThis is a modified FDA-approved test that has been validated and its performance characteristics determined by the reporting laboratory.  This laboratory is certified under the Clinical Laboratory Improvement Amendments CLIA as qualified to perform high complexity clinical laboratory testing.    CIPROFLOXACIN Value in next row Resistant      >=32 RESISTANTThis is a modified FDA-approved test that has been validated and its performance characteristics determined by the reporting laboratory.  This laboratory is certified under the Clinical Laboratory Improvement Amendments CLIA as qualified to perform high  complexity clinical laboratory testing.    GENTAMICIN Value in next row Resistant      >=32 RESISTANTThis is a modified FDA-approved test that has been validated and its performance characteristics determined by the reporting laboratory.  This laboratory is certified under the Clinical Laboratory Improvement Amendments CLIA as qualified to perform high complexity clinical laboratory testing.    NITROFURANTOIN Value in next row Intermediate      >=32 RESISTANTThis is a modified FDA-approved test that has been validated and its performance characteristics determined by the reporting laboratory.  This laboratory is certified under the Clinical Laboratory Improvement Amendments CLIA as qualified to perform high complexity clinical laboratory testing.    TRIMETH /SULFA  Value in next row Resistant      >=32 RESISTANTThis is a modified FDA-approved test that has been validated and its performance characteristics determined by the reporting laboratory.  This laboratory is certified under the Clinical Laboratory Improvement Amendments CLIA as qualified to perform high complexity clinical laboratory testing.    AMPICILLIN/SULBACTAM Value in next row Resistant      >=32 RESISTANTThis is a modified FDA-approved test that has been validated and its performance characteristics determined by the reporting laboratory.  This laboratory is certified under the Clinical Laboratory Improvement Amendments CLIA as qualified to perform high complexity clinical laboratory testing.    PIP/TAZO Value in next row Sensitive      16 SENSITIVEThis is a modified FDA-approved test that has been validated and its performance characteristics determined by the reporting laboratory.  This laboratory is certified under the Clinical Laboratory Improvement Amendments CLIA as qualified to perform high complexity clinical laboratory testing.    MEROPENEM  Value in  next row Sensitive      16 SENSITIVEThis is a modified FDA-approved test that has  been validated and its performance characteristics determined by the reporting laboratory.  This laboratory is certified under the Clinical Laboratory Improvement Amendments CLIA as qualified to perform high complexity clinical laboratory testing.    * >=100,000 COLONIES/mL KLEBSIELLA PNEUMONIAE  Resp panel by RT-PCR (RSV, Flu A&B, Covid) Anterior Nasal Swab     Status: None   Collection Time: 12/03/24 10:19 PM   Specimen: Anterior Nasal Swab  Result Value Ref Range Status   SARS Coronavirus 2 by RT PCR NEGATIVE NEGATIVE Final   Influenza A by PCR NEGATIVE NEGATIVE Final   Influenza B by PCR NEGATIVE NEGATIVE Final    Comment: (NOTE) The Xpert Xpress SARS-CoV-2/FLU/RSV plus assay is intended as an aid in the diagnosis of influenza from Nasopharyngeal swab specimens and should not be used as a sole basis for treatment. Nasal washings and aspirates are unacceptable for Xpert Xpress SARS-CoV-2/FLU/RSV testing.  Fact Sheet for Patients: bloggercourse.com  Fact Sheet for Healthcare Providers: seriousbroker.it  This test is not yet approved or cleared by the [place]  FDA and has been authorized for detection and/or diagnosis of SARS-CoV-2 by FDA under an Emergency Use Authorization (EUA). This EUA will remain in effect (meaning this test can be used) for the duration of the COVID-19 declaration under Section 564(b)(1) of the Act, 21 U.S.C. section 360bbb-3(b)(1), unless the authorization is terminated or revoked.     Resp Syncytial Virus by PCR NEGATIVE NEGATIVE Final    Comment: (NOTE) Fact Sheet for Patients: bloggercourse.com  Fact Sheet for Healthcare Providers: seriousbroker.it  This test is not yet approved or cleared by the [place]  FDA and has been authorized for detection and/or diagnosis of SARS-CoV-2 by FDA under an Emergency Use Authorization (EUA). This EUA will  remain in effect (meaning this test can be used) for the duration of the COVID-19 declaration under Section 564(b)(1) of the Act, 21 U.S.C. section 360bbb-3(b)(1), unless the authorization is terminated or revoked.  Performed at [facility], [address] [place], [place] [address]   MRSA Next Gen by PCR, Nasal     Status: Abnormal   Collection Time: 12/04/24  1:43 AM   Specimen: Nasal Mucosa; Nasal Swab  Result Value Ref Range Status   MRSA by PCR Next Gen DETECTED (A) NOT DETECTED Final    Comment: CRITICAL RESULT CALLED TO, READ BACK BY AND VERIFIED WITH:  APRIL COOPER  RN 12/05/2024 @ 0403 BY DD (NOTE) The GeneXpert MRSA Assay (FDA approved for NASAL specimens only), is one component of a comprehensive MRSA colonization surveillance program. It is not intended to diagnose MRSA infection nor to guide or monitor treatment for MRSA infections. Test performance is not FDA approved in patients less than 47 years old. Performed at [facility], [address], [place], [place] [address]          Radiology Studies: No results found.      Scheduled Meds:  amantadine   100 mg Per Tube Daily   Chlorhexidine  Gluconate Cloth  6 each Topical Daily   enoxaparin  (LOVENOX ) injection  40 mg Subcutaneous Q24H   free water   150 mL Per Tube Q8H   gatifloxacin   1 drop Both Eyes QID   Gerhardt's butt cream   Topical TID   insulin  aspart  0-6 Units Subcutaneous Q4H   insulin  aspart  3 Units Subcutaneous Q4H   insulin  glargine  15  Units Subcutaneous QHS   levETIRAcetam   500 mg Per Tube BID   levothyroxine   100 mcg Per Tube Daily   liothyronine   7.5 mcg Per Tube Q12H   midodrine   10 mg Per Tube TID WC   modafinil   100 mg Per Tube Daily   mupirocin  ointment  1 Application Nasal BID   omeprazole   40 mg Per Tube Q1200   polyethylene glycol  17 g Per Tube BID   sertraline   25 mg Per Tube Daily   sodium chloride  flush  3 mL Intravenous Q12H   sodium hypochlorite   Topical  Daily   thiamine   100 mg Per Tube Daily   Continuous Infusions:  feeding supplement (GLUCERNA 1.5 CAL) 60 mL/hr at 12/05/24 1504   meropenem  (MERREM ) IV 1 g (12/07/24 0500)     LOS: 3 days   Time spent:  Joshua JAYSON Montclair, DO [facility]  If 7PM-7AM, please contact night-coverage [email or web address]  12/07/2024, 7:35 AM

## 2024-12-08 ENCOUNTER — Other Ambulatory Visit (HOSPITAL_COMMUNITY): Payer: Self-pay

## 2024-12-08 LAB — GLUCOSE, CAPILLARY
Glucose-Capillary: 130 mg/dL — ABNORMAL HIGH (ref 70–99)
Glucose-Capillary: 132 mg/dL — ABNORMAL HIGH (ref 70–99)
Glucose-Capillary: 143 mg/dL — ABNORMAL HIGH (ref 70–99)
Glucose-Capillary: 152 mg/dL — ABNORMAL HIGH (ref 70–99)

## 2024-12-08 LAB — BASIC METABOLIC PANEL WITH GFR
Anion gap: 10 (ref 5–15)
BUN: 22 mg/dL (ref 8–23)
CO2: 24 mmol/L (ref 22–32)
Calcium: 8.8 mg/dL — ABNORMAL LOW (ref 8.9–10.3)
Chloride: 106 mmol/L (ref 98–111)
Creatinine, Ser: 0.48 mg/dL — ABNORMAL LOW (ref 0.61–1.24)
GFR, Estimated: >60 mL/min (ref >60)
Glucose, Bld: 134 mg/dL — ABNORMAL HIGH (ref 70–99)
Potassium: 4.4 mmol/L (ref 3.5–5.1)
Sodium: 140 mmol/L (ref 135–145)

## 2024-12-08 LAB — MAGNESIUM: Magnesium: 2.3 mg/dL (ref 1.7–2.4)

## 2024-12-08 LAB — PHOSPHORUS: Phosphorus: 3.1 mg/dL (ref 2.5–4.6)

## 2024-12-08 MED ORDER — INSULIN GLARGINE-YFGN 100 UNIT/ML ~~LOC~~ SOPN
15.0000 [IU] | PEN_INJECTOR | Freq: Every day | SUBCUTANEOUS | 0 refills | Status: AC
  Filled 2024-12-08: qty 4.5, 30d supply, fill #0

## 2024-12-08 MED ORDER — MEROPENEM IV (FOR PTA / DISCHARGE USE ONLY): 1.0000 g | Freq: Three times a day (TID) | INTRAVENOUS | 0 refills | Status: AC

## 2024-12-08 NOTE — Progress Notes (Signed)
Report given to Shawna.

## 2024-12-08 NOTE — TOC Transition Note (Signed)
 Transition of Care [facility]) - Discharge Note   Patient Details  Name: Joshua Haley MRN: [id number] Date of Birth: Aug 06, 1951  [facility] CM/SW Contact:  Lauraine FORBES Saa, LCSWA Phone Number: 12/08/2024, 2:11 PM   Clinical Narrative:     Patient will DC to: [facility] Anticipated DC date: 12/08/2024 Family notified: Sung Parodi; Spouse; [phone] Transport by: [facility]   Per MD patient ready for DC to [facility]. RN to call report prior to discharge [phone]). RN, patient's family, and facility notified of DC (per chart review, patient is not fully oriented). Discharge Summary and FL2 sent to facility. DC packet on chart. Ambulance transport requested for patient at 13:31.   CSW will sign off for now as social work intervention is no longer needed. Please consult us  again if new needs arise.    Final next level of care: Long Term Nursing Home Barriers to Discharge: Barriers Resolved   Patient Goals and CMS Choice            Discharge Placement              Patient chooses bed at: [facility] Patient to be transferred to facility by: [facility] Name of family member notified: Kervens Roper; Spouse; [phone] Patient and family notified of of transfer: 12/08/24  Discharge Plan and Services Additional resources added to the After Visit Summary for   In-house Referral: Clinical Social Work   Post Acute Care Choice: Skilled Nursing Facility                               Social Drivers of Health (SDOH) Interventions SDOH Screenings   Food Insecurity: Patient Unable To Answer (10/15/2024)  Housing: Patient Unable To Answer (10/15/2024)  Transportation Needs: Patient Unable To Answer (10/15/2024)  Utilities: Patient Unable To Answer (10/15/2024)  Social Connections: Patient Unable To Answer (10/15/2024)  Tobacco Use: Medium Risk (12/04/2024)     Readmission Risk Interventions    10/16/2024     4:35 PM  Readmission Risk Prevention Plan  Transportation Screening Complete  PCP or Specialist Appt within 5-7 Days Complete  Home Care Screening Complete  Medication Review (RN CM) Complete

## 2024-12-08 NOTE — Discharge Summary (Signed)
 Physician Discharge Summary  Joshua Haley FMW:[id number] DOB: November 20, 1951 DOA: 12/03/2024  PCP: Fleeta Valeria Mayo, MD  Admit date: 12/03/2024 Discharge date: 12/08/2024  Admitted From: LTAC Disposition: Same  Recommendations for Outpatient Follow-up:  Follow-up with PCP in 1 to 2 weeks, repeat labs as appropriate  Discharge Condition: Stable CODE STATUS: DNR Diet recommendation: Tube feeds as outlined below  Brief/Interim Summary: 73 y.o. male with medical history significant for TBI with aphasia and hemiparesis, tracheostomy and G-tube dependence, CAD, type 2 diabetes mellitus, chronic hypotension, history of ESBL infections, Pseudomonas in sputum, and chronic 1 L/min supplemental oxygen requirement who presents from [facility] with concerns for sepsis.    Patient is nonverbal at baseline, patient found to be febrile, tachycardic, tachypneic at facility, imaging concerning for right lower lobe pneumonia, likely aspiration as well as large stool burden and notable  pressure ulcers that do not appear to be acutely infected.  No osteomyelitis noted on imaging.   Patient admitted as above with severe sepsis secondary to aspiration pneumonia with questionable concurrent UTI.  Patient has prior history of multiple drug-resistant organisms including Pseudomonas and ESBL, infectious disease consulted with recommendations for 5 days of antibiotics, stop date 12/09/2024 on meropenem .  Patient's acute respiratory failure has resolved somewhat approaching baseline oxygen needs at 1 L nasal cannula.  Has hyponatremia, likely hypovolemic has improved with tube feeds IV fluids and free water  flushes.  He has chronic comorbid conditions including TBI now bedbound and nonverbal with numerous pressure ulcers, present on admission, as well as diabetes hypothyroidism and normocytic anemia are stable as outlined below.  At this time he is otherwise stable for discharge back to facility to continue care at [facility], he will  complete antibiotics tomorrow 12/13.  Discharge Diagnoses:  Principal Problem:   Sepsis (HCC) Active Problems:   T2DM (type 2 diabetes mellitus) (HCC)   Hypothyroidism   Acute on chronic respiratory failure with hypoxia (HCC)   Pneumonia   Tracheostomy status (HCC)   CAD (coronary artery disease)   Pressure injury of skin   Hypernatremia   Malnutrition of moderate degree  Severe Sepsis secondary to aspiration pneumonia Cannot rule out concurrent UTI  - Patient presented with sepsis and elevated lactic acid - Continue IV antibiotics -plan for 5-day course, stop date 12/13 - Prior Pseudomonas/ESBL positive cultures - Urine culture growing Klebsiella and E. Coli -both >100k - Continue meropenem  - Chronically on midodrine , no change in dose   Acute on chronic hypoxic respiratory failure  Tracheostomy intact - Baseline patient uses 1 L/min of supplemental oxygen, upon admission he required 5 L currently this has been weaned down to 2 L. - Continue tracheostomy care, supplemental O2 as-needed  -wean as tolerated   Hypernatremia-improved  - Likely hypovolemic hyponatremia, improving with initiation of tube feeds and IV fluids   Hx of TBI  - Continue supportive care, tube care, modafinil , sertraline      Pressure wounds, numerous, POA Patient with multiple unstageable pressure injury. L hip unstageable presssure injury L heel unstageable pressure injury, Sacral stage 4 pressure injury L ischium healing pressure injury, R trochanter stage 3 pressure injury Full thickness wound to R frontal LE. Full thickness wound to top of head-  - Present on arrival, no acute osteomyelitis on CT  - Continue wound care     Type II DM, well-controlled - A1c was 7.7% in October 2025  - Check CBGs, use basal and correctional insulin ; continue hypoglycemic protocol   Hypothyroidism - Continue Synthroid  and Cytomel   Normocytic Anemia Hemodilutional, stabilizing around 9 -has  baseline   Discharge Instructions  Discharge Instructions     Call MD for:  difficulty breathing, headache or visual disturbances   Complete by: As directed    Call MD for:  extreme fatigue   Complete by: As directed    Call MD for:  hives   Complete by: As directed    Call MD for:  persistant dizziness or light-headedness   Complete by: As directed    Call MD for:  persistant nausea and vomiting   Complete by: As directed    Call MD for:  redness, tenderness, or signs of infection (pain, swelling, redness, odor or green/yellow discharge around incision site)   Complete by: As directed    Call MD for:  severe uncontrolled pain   Complete by: As directed    Call MD for:  temperature >100.4   Complete by: As directed    Discharge wound care:   Complete by: As directed    Every shift: 1.        L hip: Cleanse with NS, pat dry.  Pack with Dakins soaked  kerlix gauze using a cotton tipped applicator to advance into wound bed.  Cover with dry gauze and tape.  Change daily for 7 days. 2.L heel: paint with betadine and leave open to air. 3.         Sacral: Cleanse with NS, pat dry.  Pack wound with Saline soaked gauze and cover with silicone foam dressing.  Change daily. 4.         L ischium/ R trochanter/ RLE/ top of head: Cleanse with NS, pat dry.  Apply Xeroform to wound bed and cover with silicone foam dressing.  Change daily.   Home infusion instructions   Complete by: As directed    Instructions: Flushing of vascular access device: 0.9% NaCl pre/post medication administration and prn patency; Heparin  100 u/ml, 5ml for implanted ports and Heparin  10u/ml, 5ml for all other central venous catheters.   Increase activity slowly   Complete by: As directed       Allergies as of 12/08/2024       Reactions   Morphine And Codeine Other (See Comments)   Not specified on MAR    Penicillins Other (See Comments)   Has tolerated Cefepime  and Ceftriaxone    Cefepime     Unknown reaction per  [facility]   Statins Nausea And Vomiting        Medication List     TAKE these medications    amantadine  50 MG/5ML solution Commonly known as: SYMMETREL  Place 100 mg into feeding tube daily. The timing of this medication is very important.   acetaminophen  160 MG/5ML solution Commonly known as: TYLENOL  Place 20.3 mLs (650 mg total) into feeding tube every 6 (six) hours as needed for fever.   artificial tears ophthalmic solution Place 2 drops into both eyes as needed for dry eyes.   ascorbic acid  500 MG tablet Commonly known as: VITAMIN C  Place 500 mg into feeding tube daily. Give 500mg  per tube daily   cetirizine 10 MG tablet Commonly known as: ZYRTEC Place 10 mg into feeding tube daily.   Citrucel 500 MG Tabs Generic drug: Methylcellulose (Laxative) Place 1 tablet (500 mg total) into feeding tube 3 (three) times daily as needed (Constipation).   DEX4 FAST ACTING GLUCOSE PO Take 15 g by mouth as needed (for hypoglycemia).   docusate sodium  100 MG capsule Commonly known as: COLACE Take 1  capsule (100 mg total) by mouth 2 (two) times daily as needed for mild constipation. What changed:  how to take this additional instructions   enoxaparin  40 MG/0.4ML injection Commonly known as: LOVENOX  Inject 40 mg into the skin daily.   feeding supplement (GLUCERNA 1.5 CAL) Liqd Place 60 mL/hr into feeding tube See admin instructions. Give 38mL/hr per tube by shift   ferrous sulfate  300 (60 Fe) MG/5ML syrup Place 300 mg into feeding tube 2 (two) times daily.   Fiasp  FlexTouch 100 UNIT/ML FlexTouch Pen Generic drug: insulin  aspart Inject 0-15 Units into the skin every 6 (six) hours. Sliding scale insulin : 70-120= 0 units, 121-150= 2 units, 151-200= 3 units, 201-250= 5 units, 251-300= 8 units, 301-350= 11 units, 351-400= 15 units   furosemide  20 MG tablet Commonly known as: LASIX  Place 1 tablet (20 mg total) into feeding tube daily.   gatifloxacin  0.5 % Soln Commonly known as:  ZYMAXID  Place 1 drop into both eyes 4 (four) times daily.   glucagon Emergency 1 MG Solr Inject 1 mg into the muscle as needed (for hypoglycemia).   Gvoke HypoPen 1-Pack 1 MG/0.2ML Soaj Generic drug: Glucagon Inject 1 mg into the skin as needed (for hypoglycemia).   insulin  glargine-yfgn 100 UNIT/ML Pen Commonly known as: SEMGLEE  Inject 15 Units into the skin at bedtime. What changed: how much to take   lansoprazole 30 MG disintegrating tablet Commonly known as: PREVACID SOLUTAB Place 30 mg into feeding tube daily at 12 noon.   levETIRAcetam  500 MG tablet Commonly known as: KEPPRA  Place 1 tablet (500 mg total) into feeding tube 2 (two) times daily.   levothyroxine  100 MCG tablet Commonly known as: SYNTHROID  Place 100 mcg into feeding tube daily. Give 100mcg per tube daily   liothyronine  5 MCG tablet Commonly known as: CYTOMEL  Place 7.5 mcg into feeding tube every 12 (twelve) hours. Give one and one half tablet (7.5mg ) per tube every 12 hours   meropenem  IVPB Commonly known as: MERREM  Inject 1 g into the vein every 8 (eight) hours for 5 doses. Indication:  aspiration pneumonia First Dose: No Last Day of Therapy:  12/09/2024 Method of administration: Mini-Bag Plus / Gravity Method of administration may be changed at the discretion of home infusion pharmacist based upon assessment of the patient and/or caregiver's ability to self-administer the medication ordered.   midodrine  10 MG tablet Commonly known as: PROAMATINE  Place 1 tablet (10 mg total) into feeding tube 3 (three) times daily with meals.   modafinil  100 MG tablet Commonly known as: PROVIGIL  GIVE 1 TAB VIA TUBE ONCE DAILY   mouth rinse Liqd solution 15 mLs by Mouth Rinse route 4 (four) times daily.   Nexletol 180 MG Tabs Generic drug: Bempedoic Acid Take 180 mg by mouth in the morning.   omega-3 acid ethyl esters 1 g capsule Commonly known as: LOVAZA  Place 1 g into feeding tube daily. Give 1 g per tube  daily   ondansetron  4 MG disintegrating tablet Commonly known as: ZOFRAN -ODT 4 mg every 6 (six) hours as needed for nausea or vomiting. Per tube   polyethylene glycol 17 g packet Commonly known as: MIRALAX  / GLYCOLAX  Give 17g per tube daily as needed for constipation   sertraline  25 MG tablet Commonly known as: ZOLOFT  Place 25 mg into feeding tube daily. Give 25mg  per tube daily   sodium chloride  irrigation 0.9 % irrigation Irrigate with 60 mLs as directed every 8 (eight) hours. Irrigate indwelling foley with 60mL sterile saline every 8 hours.  Home Infusion Instuctions  (From admission, onward)           Start     Ordered   12/08/24 0000  Home infusion instructions       Question:  Instructions  Answer:  Flushing of vascular access device: 0.9% NaCl pre/post medication administration and prn patency; Heparin  100 u/ml, 5ml for implanted ports and Heparin  10u/ml, 5ml for all other central venous catheters.   12/08/24 1231              Discharge Care Instructions  (From admission, onward)           Start     Ordered   12/08/24 0000  Discharge wound care:       Comments: Every shift: 1.        L hip: Cleanse with NS, pat dry.  Pack with Dakins soaked  kerlix gauze using a cotton tipped applicator to advance into wound bed.  Cover with dry gauze and tape.  Change daily for 7 days. 2.L heel: paint with betadine and leave open to air. 3.         Sacral: Cleanse with NS, pat dry.  Pack wound with Saline soaked gauze and cover with silicone foam dressing.  Change daily. 4.         L ischium/ R trochanter/ RLE/ top of head: Cleanse with NS, pat dry.  Apply Xeroform to wound bed and cover with silicone foam dressing.  Change daily.   12/08/24 1231            Contact information for after-discharge care     Destination     [facility] .   Service: Skilled Nursing Contact information: [address] [place]  [place] [address] [phone]                    Allergies[1]   Procedures/Studies: CT CHEST ABDOMEN PELVIS W CONTRAST Result Date: 12/03/2024 EXAM: CT CHEST, ABDOMEN AND PELVIS WITH CONTRAST 12/03/2024 11:12:53 PM TECHNIQUE: CT of the chest, abdomen and pelvis was performed with the administration of intravenous contrast. Multiplanar reformatted images are provided for review. Automated exposure control, iterative reconstruction, and/or weight based adjustment of the mA/kV was utilized to reduce the radiation dose to as low as reasonably achievable. COMPARISON: 10/13/2024 CLINICAL HISTORY: Sepsis FINDINGS: CHEST: MEDIASTINUM AND LYMPH NODES: Tracheostomy in the mid trachea, unchanged. 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 Heart and pericardium are otherwise unremarkable. The central airways are clear. No mediastinal, hilar or axillary lymphadenopathy. LUNGS AND PLEURA: Consolidation in the right lower lobe compatible with pneumonia. Dependent atelectasis in the left lower lobe. No pleural effusion or pneumothorax. ABDOMEN AND PELVIS: LIVER: The liver is unremarkable. GALLBLADDER AND BILE DUCTS: Prior cholecystectomy. No biliary ductal dilatation. SPLEEN: No acute abnormality. PANCREAS: No acute abnormality. ADRENAL GLANDS: No acute abnormality. KIDNEYS, URETERS AND BLADDER: Multiple bilateral renal stones including large bilateral renal pelvic stones are unchanged. No ureteral stones or hydronephrosis. No perinephric or periureteral stranding. Foley catheter in the bladder, which is decompressed. Numerous bladder stones are unchanged. GI AND BOWEL: Gastrostomy tube in the stomach. Large stool burden in the rectum, question fecal impaction. Colonic diverticulosis. Normal appendix. There is no bowel obstruction. REPRODUCTIVE ORGANS: No acute abnormality. PERITONEUM AND RETROPERITONEUM: No ascites. No free air. VASCULATURE: Aorta is normal in caliber. Aortoiliac  atherosclerosis. ABDOMINAL AND PELVIS LYMPH NODES: No lymphadenopathy. BONES AND SOFT TISSUES: Decubitus ulcer noted in the posterior left  hip region with soft tissue gas, unchanged since prior study. Probable sacral decubitus ulcer also noted and unchanged. No evidence of acute osteomyelitis. No acute osseous abnormality. IMPRESSION: 1. Right lower lobe consolidation compatible with pneumonia. 2. Large stool burden in the rectum raising concern for fecal impaction. 3. Pressure ulcer in the posterior left hip region with soft tissue gas, unchanged. Probable sacral pressure ulcer, unchanged. No evidence of acute osteomyelitis. 4. Multiple bilateral renal calculi including large pelvic stones, unchanged. No hydronephrosis. Numerous bladder stones, stable. 5. Colonic diverticulosis. No active diverticulitis. 6. Aortic atherosclerosis. Electronically signed by: Franky Crease MD 12/03/2024 11:23 PM EST RP Workstation: [id number]   DG Chest Port 1 View Result Date: 12/03/2024 EXAM: 1 VIEW(S) XRAY OF THE CHEST 12/03/2024 10:38:01 PM COMPARISON: 10/13/2024 cxr and ct CLINICAL HISTORY: Questionable sepsis - evaluate for abnormality FINDINGS: LINES, TUBES AND DEVICES: Tracheostomy tube in place with tip 6.5 cm above the carina. LUNGS AND PLEURA: Low lung volumes with bronchovascular crowding. Bibasilar, right greater than left, patchy opacities. No pleural effusion. No pneumothorax. HEART AND MEDIASTINUM: No acute abnormality of the cardiac and mediastinal silhouettes. BONES AND SOFT TISSUES: Nondisplaced median sternotomy wires. Right upper quadrant surgical clips noted. IMPRESSION: 1. Bibasilar, right greater than left, patchy airspace opacities, which may reflect pneumonia or atelectasis.Follow-up PA and lateral chest X-ray is recommended in 3-4 weeks following therapy to ensure resolution and exclude underlying malignancy. Electronically signed by: Morgane Naveau MD 12/03/2024 10:47 PM EST RP Workstation: [id number]      Subjective: No acute issues or events overnight, this morning patient had notable tube feed near insertion site of PEG tube, appears to have resolved with repositioning and protection from patient's arm   Discharge Exam: Vitals:   12/08/24 0734 12/08/24 1144  BP: 111/60 117/60  Pulse: 80 80  Resp: 17 17  Temp: 98.6 F (37 C) 98.1 F (36.7 C)  SpO2: 96% 92%   Vitals:   12/08/24 0238 12/08/24 0400 12/08/24 0734 12/08/24 1144  BP: 114/60 (!) 110/59 111/60 117/60  Pulse: 78 80 80 80  Resp: 20 14 17 17   Temp:  98.5 F (36.9 C) 98.6 F (37 C) 98.1 F (36.7 C)  TempSrc:  Oral Oral Axillary  SpO2: 97% 95% 96% 92%  Weight:      Height:        General: Resting comfortably in no acute distress, responsive to tactile stimuli. HEENT:  Normocephalic -sclera without injection or icterus, tracheostomy noted Without airleak exudate erythema or bleeding Lungs: Coarse breath sounds diffusely, rhonchi noted right lower lobe, scant Heart:  Regular rate and rhythm.  Without murmurs, rubs, or gallops. Abdomen: Feeding tube clean dry intact without erythema bleeding.. Extremities: LUE contracture noted, diffuse weakness 2/5 globally when participating Skin: Multiple lesions noted: Wound 10/13/24 1200 Pressure Injury Coccyx Medial;Left;Right Stage 4 - Full thickness tissue loss with exposed bone, tendon or muscle. (Active)     Wound 10/13/24 1336 Pressure Injury Hip Anterior;Left;Proximal Stage 4 - Full thickness tissue loss with exposed bone, tendon or muscle. (Active)     Wound 12/04/24 2120 Pressure Injury Heel Left;Posterior Unstageable - Full thickness tissue loss in which the base of the injury is covered by slough (yellow, tan, gray, green or brown) and/or eschar (tan, brown or black) in the wound bed. (Active)     Wound 12/04/24 2120 Pressure Injury Ischial tuberosity Left Stage 3 -  Full thickness tissue loss. Subcutaneous fat may be visible but bone, tendon or muscle are NOT  exposed. (Active)  The results of significant diagnostics from this hospitalization (including imaging, microbiology, ancillary and laboratory) are listed below for reference.     Microbiology: Recent Results (from the past 240 hours)  Culture, blood (Routine x 2)     Status: None (Preliminary result)   Collection Time: 12/03/24 10:10 PM   Specimen: BLOOD RIGHT ARM  Result Value Ref Range Status   Specimen Description BLOOD RIGHT ARM  Final   Special Requests   Final    BOTTLES DRAWN AEROBIC AND ANAEROBIC Blood Culture results may not be optimal due to an inadequate volume of blood received in culture bottles   Culture   Final    NO GROWTH 4 DAYS Performed at [facility], [address], [place], [place] [address]    Report Status PENDING  Incomplete  Culture, blood (Routine x 2)     Status: None (Preliminary result)   Collection Time: 12/03/24 10:11 PM   Specimen: BLOOD LEFT FOREARM  Result Value Ref Range Status   Specimen Description BLOOD LEFT FOREARM  Final   Special Requests   Final    BOTTLES DRAWN AEROBIC AND ANAEROBIC Blood Culture adequate volume   Culture   Final    NO GROWTH 4 DAYS Performed at [facility], [address], [place], [place] [address]    Report Status PENDING  Incomplete  Urine Culture     Status: Abnormal   Collection Time: 12/03/24 10:15 PM   Specimen: Urine, Random  Result Value Ref Range Status   Specimen Description URINE, RANDOM  Final   Special Requests   Final    NONE Reflexed from [id number] Performed at [facility], [address], [place], [place] [address]    Culture (A)  Final    >=100,000 COLONIES/mL KLEBSIELLA PNEUMONIAE >=100,000 COLONIES/mL ESCHERICHIA COLI Confirmed Extended Spectrum Beta-Lactamase Producer (ESBL).  In bloodstream infections from ESBL organisms, carbapenems are preferred over piperacillin/tazobactam. They are shown to have a lower risk of mortality.    Report Status 12/06/2024 FINAL   Final   Organism ID, Bacteria KLEBSIELLA PNEUMONIAE (A)  Final   Organism ID, Bacteria ESCHERICHIA COLI (A)  Final      Susceptibility   Escherichia coli - MIC*    AMPICILLIN >=32 RESISTANT Resistant     CEFAZOLIN (URINE) Value in next row Resistant      >=32 RESISTANTThis is a modified FDA-approved test that has been validated and its performance characteristics determined by the reporting laboratory.  This laboratory is certified under the Clinical Laboratory Improvement Amendments CLIA as qualified to perform high complexity clinical laboratory testing.    CEFEPIME  Value in next row Resistant      >=32 RESISTANTThis is a modified FDA-approved test that has been validated and its performance characteristics determined by the reporting laboratory.  This laboratory is certified under the Clinical Laboratory Improvement Amendments CLIA as qualified to perform high complexity clinical laboratory testing.    ERTAPENEM Value in next row Sensitive      >=32 RESISTANTThis is a modified FDA-approved test that has been validated and its performance characteristics determined by the reporting laboratory.  This laboratory is certified under the Clinical Laboratory Improvement Amendments CLIA as qualified to perform high complexity clinical laboratory testing.    CEFTRIAXONE  Value in next row Resistant      >=32 RESISTANTThis is a modified FDA-approved test that has been validated and its performance characteristics determined by the reporting laboratory.  This laboratory is certified under the  Clinical Laboratory Improvement Amendments CLIA as qualified to perform high complexity clinical laboratory testing.    CIPROFLOXACIN Value in next row Resistant      >=32 RESISTANTThis is a modified FDA-approved test that has been validated and its performance characteristics determined by the reporting laboratory.  This laboratory is certified under the Clinical Laboratory Improvement Amendments CLIA as qualified to  perform high complexity clinical laboratory testing.    GENTAMICIN Value in next row Sensitive      >=32 RESISTANTThis is a modified FDA-approved test that has been validated and its performance characteristics determined by the reporting laboratory.  This laboratory is certified under the Clinical Laboratory Improvement Amendments CLIA as qualified to perform high complexity clinical laboratory testing.    NITROFURANTOIN Value in next row Sensitive      >=32 RESISTANTThis is a modified FDA-approved test that has been validated and its performance characteristics determined by the reporting laboratory.  This laboratory is certified under the Clinical Laboratory Improvement Amendments CLIA as qualified to perform high complexity clinical laboratory testing.    TRIMETH /SULFA  Value in next row Sensitive      >=32 RESISTANTThis is a modified FDA-approved test that has been validated and its performance characteristics determined by the reporting laboratory.  This laboratory is certified under the Clinical Laboratory Improvement Amendments CLIA as qualified to perform high complexity clinical laboratory testing.    AMPICILLIN/SULBACTAM Value in next row Intermediate      >=32 RESISTANTThis is a modified FDA-approved test that has been validated and its performance characteristics determined by the reporting laboratory.  This laboratory is certified under the Clinical Laboratory Improvement Amendments CLIA as qualified to perform high complexity clinical laboratory testing.    PIP/TAZO Value in next row Sensitive      16 SENSITIVEThis is a modified FDA-approved test that has been validated and its performance characteristics determined by the reporting laboratory.  This laboratory is certified under the Clinical Laboratory Improvement Amendments CLIA as qualified to perform high complexity clinical laboratory testing.    MEROPENEM  Value in next row Sensitive      16 SENSITIVEThis is a modified FDA-approved  test that has been validated and its performance characteristics determined by the reporting laboratory.  This laboratory is certified under the Clinical Laboratory Improvement Amendments CLIA as qualified to perform high complexity clinical laboratory testing.    * >=100,000 COLONIES/mL ESCHERICHIA COLI   Klebsiella pneumoniae - MIC*    AMPICILLIN Value in next row Resistant      16 SENSITIVEThis is a modified FDA-approved test that has been validated and its performance characteristics determined by the reporting laboratory.  This laboratory is certified under the Clinical Laboratory Improvement Amendments CLIA as qualified to perform high complexity clinical laboratory testing.    CEFAZOLIN (URINE) Value in next row Resistant      >=32 RESISTANTThis is a modified FDA-approved test that has been validated and its performance characteristics determined by the reporting laboratory.  This laboratory is certified under the Clinical Laboratory Improvement Amendments CLIA as qualified to perform high complexity clinical laboratory testing.    CEFEPIME  Value in next row Resistant      >=32 RESISTANTThis is a modified FDA-approved test that has been validated and its performance characteristics determined by the reporting laboratory.  This laboratory is certified under the Clinical Laboratory Improvement Amendments CLIA as qualified to perform high complexity clinical laboratory testing.    ERTAPENEM Value in next row Sensitive      >=32 RESISTANTThis is a modified  FDA-approved test that has been validated and its performance characteristics determined by the reporting laboratory.  This laboratory is certified under the Clinical Laboratory Improvement Amendments CLIA as qualified to perform high complexity clinical laboratory testing.    CEFTRIAXONE  Value in next row Resistant      >=32 RESISTANTThis is a modified FDA-approved test that has been validated and its performance characteristics determined by the  reporting laboratory.  This laboratory is certified under the Clinical Laboratory Improvement Amendments CLIA as qualified to perform high complexity clinical laboratory testing.    CIPROFLOXACIN Value in next row Resistant      >=32 RESISTANTThis is a modified FDA-approved test that has been validated and its performance characteristics determined by the reporting laboratory.  This laboratory is certified under the Clinical Laboratory Improvement Amendments CLIA as qualified to perform high complexity clinical laboratory testing.    GENTAMICIN Value in next row Resistant      >=32 RESISTANTThis is a modified FDA-approved test that has been validated and its performance characteristics determined by the reporting laboratory.  This laboratory is certified under the Clinical Laboratory Improvement Amendments CLIA as qualified to perform high complexity clinical laboratory testing.    NITROFURANTOIN Value in next row Intermediate      >=32 RESISTANTThis is a modified FDA-approved test that has been validated and its performance characteristics determined by the reporting laboratory.  This laboratory is certified under the Clinical Laboratory Improvement Amendments CLIA as qualified to perform high complexity clinical laboratory testing.    TRIMETH /SULFA  Value in next row Resistant      >=32 RESISTANTThis is a modified FDA-approved test that has been validated and its performance characteristics determined by the reporting laboratory.  This laboratory is certified under the Clinical Laboratory Improvement Amendments CLIA as qualified to perform high complexity clinical laboratory testing.    AMPICILLIN/SULBACTAM Value in next row Resistant      >=32 RESISTANTThis is a modified FDA-approved test that has been validated and its performance characteristics determined by the reporting laboratory.  This laboratory is certified under the Clinical Laboratory Improvement Amendments CLIA as qualified to perform high  complexity clinical laboratory testing.    PIP/TAZO Value in next row Sensitive      16 SENSITIVEThis is a modified FDA-approved test that has been validated and its performance characteristics determined by the reporting laboratory.  This laboratory is certified under the Clinical Laboratory Improvement Amendments CLIA as qualified to perform high complexity clinical laboratory testing.    MEROPENEM  Value in next row Sensitive      16 SENSITIVEThis is a modified FDA-approved test that has been validated and its performance characteristics determined by the reporting laboratory.  This laboratory is certified under the Clinical Laboratory Improvement Amendments CLIA as qualified to perform high complexity clinical laboratory testing.    * >=100,000 COLONIES/mL KLEBSIELLA PNEUMONIAE  Resp panel by RT-PCR (RSV, Flu A&B, Covid) Anterior Nasal Swab     Status: None   Collection Time: 12/03/24 10:19 PM   Specimen: Anterior Nasal Swab  Result Value Ref Range Status   SARS Coronavirus 2 by RT PCR NEGATIVE NEGATIVE Final   Influenza A by PCR NEGATIVE NEGATIVE Final   Influenza B by PCR NEGATIVE NEGATIVE Final    Comment: (NOTE) The Xpert Xpress SARS-CoV-2/FLU/RSV plus assay is intended as an aid in the diagnosis of influenza from Nasopharyngeal swab specimens and should not be used as a sole basis for treatment. Nasal washings and aspirates are unacceptable for Xpert Xpress SARS-CoV-2/FLU/RSV testing.  Fact Sheet for Patients: bloggercourse.com  Fact Sheet for Healthcare Providers: seriousbroker.it  This test is not yet approved or cleared by the [place]  FDA and has been authorized for detection and/or diagnosis of SARS-CoV-2 by FDA under an Emergency Use Authorization (EUA). This EUA will remain in effect (meaning this test can be used) for the duration of the COVID-19 declaration under Section 564(b)(1) of the Act, 21 U.S.C. section  360bbb-3(b)(1), unless the authorization is terminated or revoked.     Resp Syncytial Virus by PCR NEGATIVE NEGATIVE Final    Comment: (NOTE) Fact Sheet for Patients: bloggercourse.com  Fact Sheet for Healthcare Providers: seriousbroker.it  This test is not yet approved or cleared by the [place]  FDA and has been authorized for detection and/or diagnosis of SARS-CoV-2 by FDA under an Emergency Use Authorization (EUA). This EUA will remain in effect (meaning this test can be used) for the duration of the COVID-19 declaration under Section 564(b)(1) of the Act, 21 U.S.C. section 360bbb-3(b)(1), unless the authorization is terminated or revoked.  Performed at [facility], [address], [place], [place] [address]   MRSA Next Gen by PCR, Nasal     Status: Abnormal   Collection Time: 12/04/24  1:43 AM   Specimen: Nasal Mucosa; Nasal Swab  Result Value Ref Range Status   MRSA by PCR Next Gen DETECTED (A) NOT DETECTED Final    Comment: CRITICAL RESULT CALLED TO, READ BACK BY AND VERIFIED WITH:  APRIL COOPER  RN 12/05/2024 @ 0403 BY DD (NOTE) The GeneXpert MRSA Assay (FDA approved for NASAL specimens only), is one component of a comprehensive MRSA colonization surveillance program. It is not intended to diagnose MRSA infection nor to guide or monitor treatment for MRSA infections. Test performance is not FDA approved in patients less than 68 years old. Performed at [facility], [address] [place], [place] [address]      Labs: BNP (last 3 results) No results for input(s): BNP in the last 8760 hours. Basic Metabolic Panel: Recent Labs  Lab 12/06/24 0436 12/06/24 0645 12/06/24 1815 12/07/24 0215 12/07/24 1033 12/07/24 2105 12/08/24 0254 12/08/24 0735  NA  --  143 141  --  141 141  --  140  K  --  3.7 4.1  --  4.5 4.1  --  4.4  CL  --  106 103  --  106 105  --  106  CO2  --  30 29  --  26 27  --   24  GLUCOSE  --  302* 226*  --  149* 132*  --  134*  BUN  --  26* 25*  --  22 22  --  22  CREATININE  --  0.55* 0.46*  --  0.53* 0.47*  --  0.48*  CALCIUM  --  8.4* 8.3*  --  8.3* 8.5*  --  8.8*  MG 2.1  --   --  2.2  --   --  2.3  --   PHOS 1.8*  --   --  3.2  --   --  3.1  --    Liver Function Tests: Recent Labs  Lab 12/03/24 2215  AST 19  ALT 13  ALKPHOS 105  BILITOT 0.6  PROT 8.5*  ALBUMIN 2.3*   No results for input(s): LIPASE, AMYLASE in the last 168 hours. No results for input(s): AMMONIA in the last 168 hours. CBC: Recent Labs  Lab 12/03/24 2215 12/03/24 2226  12/04/24 0241 12/05/24 0646 12/06/24 0436 12/06/24 1815 12/07/24 0215  WBC 14.4*  --  10.7* 7.2 5.6  --  6.6  NEUTROABS 12.4*  --   --   --   --   --   --   HGB 10.5*   < > 8.9* 10.7* 8.8* 9.2* 9.4*  HCT 38.1*   < > 30.9* 36.8* 29.5* 30.2* 31.4*  MCV 94.5  --  93.4 93.4 91.6  --  90.0  PLT 396  --  341 314 326  --  372   < > = values in this interval not displayed.   Cardiac Enzymes: No results for input(s): CKTOTAL, CKMB, CKMBINDEX, TROPONINI in the last 168 hours. BNP: Invalid input(s): POCBNP CBG: Recent Labs  Lab 12/07/24 2005 12/08/24 0011 12/08/24 0323 12/08/24 0732 12/08/24 1141  GLUCAP 127* 152* 132* 130* 143*   D-Dimer No results for input(s): DDIMER in the last 72 hours. Hgb A1c No results for input(s): HGBA1C in the last 72 hours. Lipid Profile No results for input(s): CHOL, HDL, LDLCALC, TRIG, CHOLHDL, LDLDIRECT in the last 72 hours. Thyroid function studies No results for input(s): TSH, T4TOTAL, T3FREE, THYROIDAB in the last 72 hours.  Invalid input(s): FREET3 Anemia work up No results for input(s): VITAMINB12, FOLATE, FERRITIN, TIBC, IRON , RETICCTPCT in the last 72 hours. Urinalysis    Component Value Date/Time   COLORURINE AMBER (A) 12/03/2024 2215   APPEARANCEUR CLOUDY (A) 12/03/2024 2215   LABSPEC 1.018 12/03/2024  2215   PHURINE 6.0 12/03/2024 2215   GLUCOSEU NEGATIVE 12/03/2024 2215   HGBUR SMALL (A) 12/03/2024 2215   BILIRUBINUR NEGATIVE 12/03/2024 2215   KETONESUR NEGATIVE 12/03/2024 2215   PROTEINUR 100 (A) 12/03/2024 2215   NITRITE NEGATIVE 12/03/2024 2215   LEUKOCYTESUR MODERATE (A) 12/03/2024 2215   Sepsis Labs Recent Labs  Lab 12/04/24 0241 12/05/24 0646 12/06/24 0436 12/07/24 0215  WBC 10.7* 7.2 5.6 6.6   Microbiology Recent Results (from the past 240 hours)  Culture, blood (Routine x 2)     Status: None (Preliminary result)   Collection Time: 12/03/24 10:10 PM   Specimen: BLOOD RIGHT ARM  Result Value Ref Range Status   Specimen Description BLOOD RIGHT ARM  Final   Special Requests   Final    BOTTLES DRAWN AEROBIC AND ANAEROBIC Blood Culture results may not be optimal due to an inadequate volume of blood received in culture bottles   Culture   Final    NO GROWTH 4 DAYS Performed at [facility], [address] [place], [place] [address]    Report Status PENDING  Incomplete  Culture, blood (Routine x 2)     Status: None (Preliminary result)   Collection Time: 12/03/24 10:11 PM   Specimen: BLOOD LEFT FOREARM  Result Value Ref Range Status   Specimen Description BLOOD LEFT FOREARM  Final   Special Requests   Final    BOTTLES DRAWN AEROBIC AND ANAEROBIC Blood Culture adequate volume   Culture   Final    NO GROWTH 4 DAYS Performed at [facility], [address], [place], [place] [address]    Report Status PENDING  Incomplete  Urine Culture     Status: Abnormal   Collection Time: 12/03/24 10:15 PM   Specimen: Urine, Random  Result Value Ref Range Status   Specimen Description URINE, RANDOM  Final   Special Requests   Final    NONE Reflexed from [id number] Performed at [facility], [address] [place], [place] [address]    Culture (A)  Final    >=100,000 COLONIES/mL KLEBSIELLA PNEUMONIAE >=100,000 COLONIES/mL ESCHERICHIA COLI Confirmed Extended  Spectrum Beta-Lactamase Producer (ESBL).  In bloodstream infections from ESBL organisms, carbapenems are preferred over piperacillin/tazobactam. They are shown to have a lower risk of mortality.    Report Status 12/06/2024 FINAL  Final   Organism ID, Bacteria KLEBSIELLA PNEUMONIAE (A)  Final   Organism ID, Bacteria ESCHERICHIA COLI (A)  Final      Susceptibility   Escherichia coli - MIC*    AMPICILLIN >=32 RESISTANT Resistant     CEFAZOLIN (URINE) Value in next row Resistant      >=32 RESISTANTThis is a modified FDA-approved test that has been validated and its performance characteristics determined by the reporting laboratory.  This laboratory is certified under the Clinical Laboratory Improvement Amendments CLIA as qualified to perform high complexity clinical laboratory testing.    CEFEPIME  Value in next row Resistant      >=32 RESISTANTThis is a modified FDA-approved test that has been validated and its performance characteristics determined by the reporting laboratory.  This laboratory is certified under the Clinical Laboratory Improvement Amendments CLIA as qualified to perform high complexity clinical laboratory testing.    ERTAPENEM Value in next row Sensitive      >=32 RESISTANTThis is a modified FDA-approved test that has been validated and its performance characteristics determined by the reporting laboratory.  This laboratory is certified under the Clinical Laboratory Improvement Amendments CLIA as qualified to perform high complexity clinical laboratory testing.    CEFTRIAXONE  Value in next row Resistant      >=32 RESISTANTThis is a modified FDA-approved test that has been validated and its performance characteristics determined by the reporting laboratory.  This laboratory is certified under the Clinical Laboratory Improvement Amendments CLIA as qualified to perform high complexity clinical laboratory testing.    CIPROFLOXACIN Value in next row Resistant      >=32 RESISTANTThis is a  modified FDA-approved test that has been validated and its performance characteristics determined by the reporting laboratory.  This laboratory is certified under the Clinical Laboratory Improvement Amendments CLIA as qualified to perform high complexity clinical laboratory testing.    GENTAMICIN Value in next row Sensitive      >=32 RESISTANTThis is a modified FDA-approved test that has been validated and its performance characteristics determined by the reporting laboratory.  This laboratory is certified under the Clinical Laboratory Improvement Amendments CLIA as qualified to perform high complexity clinical laboratory testing.    NITROFURANTOIN Value in next row Sensitive      >=32 RESISTANTThis is a modified FDA-approved test that has been validated and its performance characteristics determined by the reporting laboratory.  This laboratory is certified under the Clinical Laboratory Improvement Amendments CLIA as qualified to perform high complexity clinical laboratory testing.    TRIMETH /SULFA  Value in next row Sensitive      >=32 RESISTANTThis is a modified FDA-approved test that has been validated and its performance characteristics determined by the reporting laboratory.  This laboratory is certified under the Clinical Laboratory Improvement Amendments CLIA as qualified to perform high complexity clinical laboratory testing.    AMPICILLIN/SULBACTAM Value in next row Intermediate      >=32 RESISTANTThis is a modified FDA-approved test that has been validated and its performance characteristics determined by the reporting laboratory.  This laboratory is certified under the Clinical Laboratory Improvement Amendments CLIA as qualified to perform high complexity clinical laboratory testing.  PIP/TAZO Value in next row Sensitive      16 SENSITIVEThis is a modified FDA-approved test that has been validated and its performance characteristics determined by the reporting laboratory.  This laboratory is  certified under the Clinical Laboratory Improvement Amendments CLIA as qualified to perform high complexity clinical laboratory testing.    MEROPENEM  Value in next row Sensitive      16 SENSITIVEThis is a modified FDA-approved test that has been validated and its performance characteristics determined by the reporting laboratory.  This laboratory is certified under the Clinical Laboratory Improvement Amendments CLIA as qualified to perform high complexity clinical laboratory testing.    * >=100,000 COLONIES/mL ESCHERICHIA COLI   Klebsiella pneumoniae - MIC*    AMPICILLIN Value in next row Resistant      16 SENSITIVEThis is a modified FDA-approved test that has been validated and its performance characteristics determined by the reporting laboratory.  This laboratory is certified under the Clinical Laboratory Improvement Amendments CLIA as qualified to perform high complexity clinical laboratory testing.    CEFAZOLIN (URINE) Value in next row Resistant      >=32 RESISTANTThis is a modified FDA-approved test that has been validated and its performance characteristics determined by the reporting laboratory.  This laboratory is certified under the Clinical Laboratory Improvement Amendments CLIA as qualified to perform high complexity clinical laboratory testing.    CEFEPIME  Value in next row Resistant      >=32 RESISTANTThis is a modified FDA-approved test that has been validated and its performance characteristics determined by the reporting laboratory.  This laboratory is certified under the Clinical Laboratory Improvement Amendments CLIA as qualified to perform high complexity clinical laboratory testing.    ERTAPENEM Value in next row Sensitive      >=32 RESISTANTThis is a modified FDA-approved test that has been validated and its performance characteristics determined by the reporting laboratory.  This laboratory is certified under the Clinical Laboratory Improvement Amendments CLIA as qualified to  perform high complexity clinical laboratory testing.    CEFTRIAXONE  Value in next row Resistant      >=32 RESISTANTThis is a modified FDA-approved test that has been validated and its performance characteristics determined by the reporting laboratory.  This laboratory is certified under the Clinical Laboratory Improvement Amendments CLIA as qualified to perform high complexity clinical laboratory testing.    CIPROFLOXACIN Value in next row Resistant      >=32 RESISTANTThis is a modified FDA-approved test that has been validated and its performance characteristics determined by the reporting laboratory.  This laboratory is certified under the Clinical Laboratory Improvement Amendments CLIA as qualified to perform high complexity clinical laboratory testing.    GENTAMICIN Value in next row Resistant      >=32 RESISTANTThis is a modified FDA-approved test that has been validated and its performance characteristics determined by the reporting laboratory.  This laboratory is certified under the Clinical Laboratory Improvement Amendments CLIA as qualified to perform high complexity clinical laboratory testing.    NITROFURANTOIN Value in next row Intermediate      >=32 RESISTANTThis is a modified FDA-approved test that has been validated and its performance characteristics determined by the reporting laboratory.  This laboratory is certified under the Clinical Laboratory Improvement Amendments CLIA as qualified to perform high complexity clinical laboratory testing.    TRIMETH /SULFA  Value in next row Resistant      >=32 RESISTANTThis is a modified FDA-approved test that has been validated and its performance characteristics determined by the reporting laboratory.  This  laboratory is certified under the Clinical Laboratory Improvement Amendments CLIA as qualified to perform high complexity clinical laboratory testing.    AMPICILLIN/SULBACTAM Value in next row Resistant      >=32 RESISTANTThis is a modified  FDA-approved test that has been validated and its performance characteristics determined by the reporting laboratory.  This laboratory is certified under the Clinical Laboratory Improvement Amendments CLIA as qualified to perform high complexity clinical laboratory testing.    PIP/TAZO Value in next row Sensitive      16 SENSITIVEThis is a modified FDA-approved test that has been validated and its performance characteristics determined by the reporting laboratory.  This laboratory is certified under the Clinical Laboratory Improvement Amendments CLIA as qualified to perform high complexity clinical laboratory testing.    MEROPENEM  Value in next row Sensitive      16 SENSITIVEThis is a modified FDA-approved test that has been validated and its performance characteristics determined by the reporting laboratory.  This laboratory is certified under the Clinical Laboratory Improvement Amendments CLIA as qualified to perform high complexity clinical laboratory testing.    * >=100,000 COLONIES/mL KLEBSIELLA PNEUMONIAE  Resp panel by RT-PCR (RSV, Flu A&B, Covid) Anterior Nasal Swab     Status: None   Collection Time: 12/03/24 10:19 PM   Specimen: Anterior Nasal Swab  Result Value Ref Range Status   SARS Coronavirus 2 by RT PCR NEGATIVE NEGATIVE Final   Influenza A by PCR NEGATIVE NEGATIVE Final   Influenza B by PCR NEGATIVE NEGATIVE Final    Comment: (NOTE) The Xpert Xpress SARS-CoV-2/FLU/RSV plus assay is intended as an aid in the diagnosis of influenza from Nasopharyngeal swab specimens and should not be used as a sole basis for treatment. Nasal washings and aspirates are unacceptable for Xpert Xpress SARS-CoV-2/FLU/RSV testing.  Fact Sheet for Patients: bloggercourse.com  Fact Sheet for Healthcare Providers: seriousbroker.it  This test is not yet approved or cleared by the [place]  FDA and has been authorized for detection and/or diagnosis  of SARS-CoV-2 by FDA under an Emergency Use Authorization (EUA). This EUA will remain in effect (meaning this test can be used) for the duration of the COVID-19 declaration under Section 564(b)(1) of the Act, 21 U.S.C. section 360bbb-3(b)(1), unless the authorization is terminated or revoked.     Resp Syncytial Virus by PCR NEGATIVE NEGATIVE Final    Comment: (NOTE) Fact Sheet for Patients: bloggercourse.com  Fact Sheet for Healthcare Providers: seriousbroker.it  This test is not yet approved or cleared by the [place]  FDA and has been authorized for detection and/or diagnosis of SARS-CoV-2 by FDA under an Emergency Use Authorization (EUA). This EUA will remain in effect (meaning this test can be used) for the duration of the COVID-19 declaration under Section 564(b)(1) of the Act, 21 U.S.C. section 360bbb-3(b)(1), unless the authorization is terminated or revoked.  Performed at [facility], [address] [place], [place] [address]   MRSA Next Gen by PCR, Nasal     Status: Abnormal   Collection Time: 12/04/24  1:43 AM   Specimen: Nasal Mucosa; Nasal Swab  Result Value Ref Range Status   MRSA by PCR Next Gen DETECTED (A) NOT DETECTED Final    Comment: CRITICAL RESULT CALLED TO, READ BACK BY AND VERIFIED WITH:  APRIL COOPER  RN 12/05/2024 @ 0403 BY DD (NOTE) The GeneXpert MRSA Assay (FDA approved for NASAL specimens only), is one component of a comprehensive MRSA colonization surveillance program. It is not intended to diagnose MRSA infection nor  to guide or monitor treatment for MRSA infections. Test performance is not FDA approved in patients less than 55 years old. Performed at [facility], [address], [place], [place] [address]      Time coordinating discharge: Over 30 minutes  SIGNED:   Elsie JAYSON Montclair, DO [facility] 12/08/2024, 12:45 PM Pager   If 7PM-7AM, please contact  night-coverage [email or web address]     [1]  Allergies Allergen Reactions   Morphine And Codeine Other (See Comments)    Not specified on MAR    Penicillins Other (See Comments)    Has tolerated Cefepime  and Ceftriaxone    Cefepime      Unknown reaction per [facility]   Statins Nausea And Vomiting

## 2024-12-08 NOTE — Progress Notes (Signed)
 PHARMACY - PHYSICIAN COMMUNICATION CRITICAL VALUE ALERT - BLOOD CULTURE UPDATE  Joshua Haley is an 73 y.o. male who presented to [facility] on 12/03/2024 with a chief complaint of sepsis  Assessment:  29 YOM who presented with concern for sepsis - treated for asp PNA + UTI with notable improvement. Now with 1 of 4 bottles growing GPR - presumed to be contamination at this time.   Name of physician (or Provider) Contacted: Lue  Current antibiotics: Meropenem  for urinary coverage  Changes to prescribed antibiotics recommended:  Patient is on recommended antibiotics - No changes needed  Thank you for allowing pharmacy to be a part of this patients care.  Almarie Lunger, PharmD, BCPS, BCIDP Infectious Diseases Clinical Pharmacist 12/08/2024 12:58 PM   **Pharmacist phone directory can now be found on [email or web address] ([email or web address]).  Listed under [facility].

## 2024-12-08 NOTE — Plan of Care (Signed)
°  Problem: Fluid Volume: Goal: Hemodynamic stability will improve Outcome: Progressing   Problem: Clinical Measurements: Goal: Diagnostic test results will improve Outcome: Progressing   Problem: Respiratory: Goal: Ability to maintain adequate ventilation will improve Outcome: Progressing   Problem: Fluid Volume: Goal: Ability to maintain a balanced intake and output will improve Outcome: Progressing   Problem: Metabolic: Goal: Ability to maintain appropriate glucose levels will improve Outcome: Progressing   Problem: Nutritional: Goal: Maintenance of adequate nutrition will improve Outcome: Progressing   Problem: Tissue Perfusion: Goal: Adequacy of tissue perfusion will improve Outcome: Progressing   Problem: Clinical Measurements: Goal: Ability to maintain clinical measurements within normal limits will improve Outcome: Progressing   Problem: Education: Goal: Knowledge of General Education information will improve Description: Including pain rating scale, medication(s)/side effects and non-pharmacologic comfort measures Outcome: Not Progressing

## 2024-12-09 LAB — CULTURE, BLOOD (ROUTINE X 2): Culture: NO GROWTH

## 2024-12-10 LAB — CULTURE, BLOOD (ROUTINE X 2)
Culture  Setup Time: NO GROWTH
Special Requests: ADEQUATE

## 2024-12-24 ENCOUNTER — Other Ambulatory Visit: Payer: Self-pay

## 2024-12-24 ENCOUNTER — Encounter (HOSPITAL_COMMUNITY): Payer: Self-pay | Admitting: Emergency Medicine

## 2024-12-24 ENCOUNTER — Emergency Department (HOSPITAL_COMMUNITY): Payer: PRIVATE HEALTH INSURANCE

## 2024-12-24 ENCOUNTER — Inpatient Hospital Stay (HOSPITAL_COMMUNITY)
Admission: EM | Admit: 2024-12-24 | Discharge: 2025-01-02 | DRG: 871 | Payer: PRIVATE HEALTH INSURANCE | Source: Other Acute Inpatient Hospital | Attending: Internal Medicine | Admitting: Internal Medicine

## 2024-12-24 DIAGNOSIS — Z9049 Acquired absence of other specified parts of digestive tract: Secondary | ICD-10-CM

## 2024-12-24 DIAGNOSIS — J9601 Acute respiratory failure with hypoxia: Secondary | ICD-10-CM | POA: Diagnosis not present

## 2024-12-24 DIAGNOSIS — Z87820 Personal history of traumatic brain injury: Secondary | ICD-10-CM

## 2024-12-24 DIAGNOSIS — Z8619 Personal history of other infectious and parasitic diseases: Secondary | ICD-10-CM

## 2024-12-24 DIAGNOSIS — B961 Klebsiella pneumoniae [K. pneumoniae] as the cause of diseases classified elsewhere: Secondary | ICD-10-CM | POA: Diagnosis present

## 2024-12-24 DIAGNOSIS — L89323 Pressure ulcer of left buttock, stage 3: Secondary | ICD-10-CM | POA: Diagnosis present

## 2024-12-24 DIAGNOSIS — E870 Hyperosmolality and hypernatremia: Secondary | ICD-10-CM | POA: Diagnosis not present

## 2024-12-24 DIAGNOSIS — Z9911 Dependence on respirator [ventilator] status: Secondary | ICD-10-CM

## 2024-12-24 DIAGNOSIS — E119 Type 2 diabetes mellitus without complications: Secondary | ICD-10-CM | POA: Diagnosis not present

## 2024-12-24 DIAGNOSIS — A419 Sepsis, unspecified organism: Principal | ICD-10-CM | POA: Diagnosis present

## 2024-12-24 DIAGNOSIS — Z930 Tracheostomy status: Secondary | ICD-10-CM

## 2024-12-24 DIAGNOSIS — L89229 Pressure ulcer of left hip, unspecified stage: Secondary | ICD-10-CM | POA: Diagnosis present

## 2024-12-24 DIAGNOSIS — E039 Hypothyroidism, unspecified: Secondary | ICD-10-CM | POA: Diagnosis present

## 2024-12-24 DIAGNOSIS — R6521 Severe sepsis with septic shock: Secondary | ICD-10-CM | POA: Diagnosis present

## 2024-12-24 DIAGNOSIS — Z79899 Other long term (current) drug therapy: Secondary | ICD-10-CM

## 2024-12-24 DIAGNOSIS — A4159 Other Gram-negative sepsis: Principal | ICD-10-CM | POA: Diagnosis present

## 2024-12-24 DIAGNOSIS — Z885 Allergy status to narcotic agent status: Secondary | ICD-10-CM

## 2024-12-24 DIAGNOSIS — G40909 Epilepsy, unspecified, not intractable, without status epilepticus: Secondary | ICD-10-CM | POA: Diagnosis present

## 2024-12-24 DIAGNOSIS — G9340 Encephalopathy, unspecified: Secondary | ICD-10-CM | POA: Diagnosis not present

## 2024-12-24 DIAGNOSIS — L89896 Pressure-induced deep tissue damage of other site: Secondary | ICD-10-CM | POA: Diagnosis present

## 2024-12-24 DIAGNOSIS — L89216 Pressure-induced deep tissue damage of right hip: Secondary | ICD-10-CM | POA: Diagnosis present

## 2024-12-24 DIAGNOSIS — N179 Acute kidney failure, unspecified: Secondary | ICD-10-CM | POA: Diagnosis present

## 2024-12-24 DIAGNOSIS — G8194 Hemiplegia, unspecified affecting left nondominant side: Secondary | ICD-10-CM | POA: Diagnosis present

## 2024-12-24 DIAGNOSIS — Z888 Allergy status to other drugs, medicaments and biological substances status: Secondary | ICD-10-CM

## 2024-12-24 DIAGNOSIS — L89620 Pressure ulcer of left heel, unstageable: Secondary | ICD-10-CM | POA: Diagnosis present

## 2024-12-24 DIAGNOSIS — J9620 Acute and chronic respiratory failure, unspecified whether with hypoxia or hypercapnia: Secondary | ICD-10-CM | POA: Diagnosis not present

## 2024-12-24 DIAGNOSIS — Z79890 Hormone replacement therapy (postmenopausal): Secondary | ICD-10-CM | POA: Diagnosis not present

## 2024-12-24 DIAGNOSIS — K219 Gastro-esophageal reflux disease without esophagitis: Secondary | ICD-10-CM | POA: Diagnosis present

## 2024-12-24 DIAGNOSIS — E440 Moderate protein-calorie malnutrition: Secondary | ICD-10-CM | POA: Diagnosis present

## 2024-12-24 DIAGNOSIS — D638 Anemia in other chronic diseases classified elsewhere: Secondary | ICD-10-CM | POA: Diagnosis present

## 2024-12-24 DIAGNOSIS — L89890 Pressure ulcer of other site, unstageable: Secondary | ICD-10-CM | POA: Diagnosis present

## 2024-12-24 DIAGNOSIS — L89154 Pressure ulcer of sacral region, stage 4: Secondary | ICD-10-CM | POA: Diagnosis present

## 2024-12-24 DIAGNOSIS — Z87891 Personal history of nicotine dependence: Secondary | ICD-10-CM

## 2024-12-24 DIAGNOSIS — J9621 Acute and chronic respiratory failure with hypoxia: Secondary | ICD-10-CM | POA: Diagnosis present

## 2024-12-24 DIAGNOSIS — I2510 Atherosclerotic heart disease of native coronary artery without angina pectoris: Secondary | ICD-10-CM | POA: Diagnosis present

## 2024-12-24 DIAGNOSIS — Z794 Long term (current) use of insulin: Secondary | ICD-10-CM

## 2024-12-24 DIAGNOSIS — Z87442 Personal history of urinary calculi: Secondary | ICD-10-CM

## 2024-12-24 DIAGNOSIS — Z1624 Resistance to multiple antibiotics: Secondary | ICD-10-CM | POA: Diagnosis present

## 2024-12-24 DIAGNOSIS — I10 Essential (primary) hypertension: Secondary | ICD-10-CM | POA: Diagnosis present

## 2024-12-24 DIAGNOSIS — Z880 Allergy status to penicillin: Secondary | ICD-10-CM

## 2024-12-24 DIAGNOSIS — J189 Pneumonia, unspecified organism: Secondary | ICD-10-CM | POA: Diagnosis present

## 2024-12-24 DIAGNOSIS — Z931 Gastrostomy status: Secondary | ICD-10-CM

## 2024-12-24 DIAGNOSIS — B964 Proteus (mirabilis) (morganii) as the cause of diseases classified elsewhere: Secondary | ICD-10-CM | POA: Diagnosis present

## 2024-12-24 DIAGNOSIS — E1110 Type 2 diabetes mellitus with ketoacidosis without coma: Secondary | ICD-10-CM | POA: Diagnosis present

## 2024-12-24 DIAGNOSIS — B9562 Methicillin resistant Staphylococcus aureus infection as the cause of diseases classified elsewhere: Secondary | ICD-10-CM | POA: Diagnosis present

## 2024-12-24 DIAGNOSIS — R4701 Aphasia: Secondary | ICD-10-CM | POA: Diagnosis present

## 2024-12-24 DIAGNOSIS — B965 Pseudomonas (aeruginosa) (mallei) (pseudomallei) as the cause of diseases classified elsewhere: Secondary | ICD-10-CM | POA: Diagnosis present

## 2024-12-24 DIAGNOSIS — Y95 Nosocomial condition: Secondary | ICD-10-CM | POA: Diagnosis present

## 2024-12-24 DIAGNOSIS — I4711 Inappropriate sinus tachycardia, so stated: Secondary | ICD-10-CM | POA: Diagnosis not present

## 2024-12-24 DIAGNOSIS — Z87440 Personal history of urinary (tract) infections: Secondary | ICD-10-CM

## 2024-12-24 DIAGNOSIS — Z934 Other artificial openings of gastrointestinal tract status: Secondary | ICD-10-CM

## 2024-12-24 DIAGNOSIS — Z6826 Body mass index (BMI) 26.0-26.9, adult: Secondary | ICD-10-CM

## 2024-12-24 LAB — I-STAT CHEM 8, ED
BUN: 76 mg/dL — ABNORMAL HIGH (ref 8–23)
Calcium, Ion: 1.12 mmol/L — ABNORMAL LOW (ref 1.15–1.40)
Chloride: 112 mmol/L — ABNORMAL HIGH (ref 98–111)
Creatinine, Ser: 1.7 mg/dL — ABNORMAL HIGH (ref 0.61–1.24)
Glucose, Bld: >700 mg/dL (ref 70–99)
HCT: 40 % (ref 39.0–52.0)
Hemoglobin: 13.6 g/dL (ref 13.0–17.0)
Potassium: 4.2 mmol/L (ref 3.5–5.1)
Sodium: 154 mmol/L — ABNORMAL HIGH (ref 135–145)
TCO2: 21 mmol/L — ABNORMAL LOW (ref 22–32)

## 2024-12-24 LAB — I-STAT VENOUS BLOOD GAS, ED
Acid-base deficit: 5 mmol/L — ABNORMAL HIGH (ref 0.0–2.0)
Bicarbonate: 21.1 mmol/L (ref 20.0–28.0)
Calcium, Ion: 1.12 mmol/L — ABNORMAL LOW (ref 1.15–1.40)
HCT: 39 % (ref 39.0–52.0)
Hemoglobin: 13.3 g/dL (ref 13.0–17.0)
O2 Saturation: 80 %
Potassium: 4.1 mmol/L (ref 3.5–5.1)
Sodium: 154 mmol/L — ABNORMAL HIGH (ref 135–145)
TCO2: 22 mmol/L (ref 22–32)
pCO2, Ven: 44.1 mmHg (ref 44–60)
pH, Ven: 7.289 (ref 7.25–7.43)
pO2, Ven: 49 mmHg — ABNORMAL HIGH (ref 32–45)

## 2024-12-24 LAB — RESP PANEL BY RT-PCR (RSV, FLU A&B, COVID)  RVPGX2
Influenza A by PCR: NEGATIVE
Influenza B by PCR: NEGATIVE
Resp Syncytial Virus by PCR: NEGATIVE
SARS Coronavirus 2 by RT PCR: NEGATIVE

## 2024-12-24 LAB — I-STAT ARTERIAL BLOOD GAS, ED
Acid-base deficit: 7 mmol/L — ABNORMAL HIGH (ref 0.0–2.0)
Bicarbonate: 18.6 mmol/L — ABNORMAL LOW (ref 20.0–28.0)
Calcium, Ion: 1.22 mmol/L (ref 1.15–1.40)
HCT: 36 % — ABNORMAL LOW (ref 39.0–52.0)
Hemoglobin: 12.2 g/dL — ABNORMAL LOW (ref 13.0–17.0)
O2 Saturation: 99 %
Patient temperature: 106.9
Potassium: 4 mmol/L (ref 3.5–5.1)
Sodium: 153 mmol/L — ABNORMAL HIGH (ref 135–145)
TCO2: 20 mmol/L — ABNORMAL LOW (ref 22–32)
pCO2 arterial: 44.6 mmHg (ref 32–48)
pH, Arterial: 7.25 — ABNORMAL LOW (ref 7.35–7.45)
pO2, Arterial: 186 mmHg — ABNORMAL HIGH (ref 83–108)

## 2024-12-24 LAB — COMPREHENSIVE METABOLIC PANEL WITH GFR
ALT: 53 U/L — ABNORMAL HIGH (ref 0–44)
AST: 114 U/L — ABNORMAL HIGH (ref 15–41)
Albumin: 3.2 g/dL — ABNORMAL LOW (ref 3.5–5.0)
Alkaline Phosphatase: 140 U/L — ABNORMAL HIGH (ref 38–126)
Anion gap: 28 — ABNORMAL HIGH (ref 5–15)
BUN: 69 mg/dL — ABNORMAL HIGH (ref 8–23)
CO2: 19 mmol/L — ABNORMAL LOW (ref 22–32)
Calcium: 10.2 mg/dL (ref 8.9–10.3)
Chloride: 107 mmol/L (ref 98–111)
Creatinine, Ser: 1.8 mg/dL — ABNORMAL HIGH (ref 0.61–1.24)
GFR, Estimated: 39 mL/min — ABNORMAL LOW
Glucose, Bld: 763 mg/dL (ref 70–99)
Potassium: 4.4 mmol/L (ref 3.5–5.1)
Sodium: 154 mmol/L — ABNORMAL HIGH (ref 135–145)
Total Bilirubin: 0.7 mg/dL (ref 0.0–1.2)
Total Protein: 8.7 g/dL — ABNORMAL HIGH (ref 6.5–8.1)

## 2024-12-24 LAB — BASIC METABOLIC PANEL WITH GFR
Anion gap: 24 — ABNORMAL HIGH (ref 5–15)
BUN: 68 mg/dL — ABNORMAL HIGH (ref 8–23)
CO2: 18 mmol/L — ABNORMAL LOW (ref 22–32)
Calcium: 9.2 mg/dL (ref 8.9–10.3)
Chloride: 107 mmol/L (ref 98–111)
Creatinine, Ser: 1.73 mg/dL — ABNORMAL HIGH (ref 0.61–1.24)
GFR, Estimated: 41 mL/min — ABNORMAL LOW
Glucose, Bld: 686 mg/dL (ref 70–99)
Potassium: 3.7 mmol/L (ref 3.5–5.1)
Sodium: 149 mmol/L — ABNORMAL HIGH (ref 135–145)

## 2024-12-24 LAB — CBC WITH DIFFERENTIAL/PLATELET
Basophils Absolute: 0.1 K/uL (ref 0.0–0.1)
Basophils Relative: 1 %
Eosinophils Absolute: 0 K/uL (ref 0.0–0.5)
Eosinophils Relative: 0 %
HCT: 44 % (ref 39.0–52.0)
Hemoglobin: 11.9 g/dL — ABNORMAL LOW (ref 13.0–17.0)
Lymphocytes Relative: 8 %
Lymphs Abs: 0.9 K/uL (ref 0.7–4.0)
MCH: 27.5 pg (ref 26.0–34.0)
MCHC: 27 g/dL — ABNORMAL LOW (ref 30.0–36.0)
MCV: 101.6 fL — ABNORMAL HIGH (ref 80.0–100.0)
Monocytes Absolute: 0.7 K/uL (ref 0.1–1.0)
Monocytes Relative: 6 %
Neutro Abs: 9.9 K/uL — ABNORMAL HIGH (ref 1.7–7.7)
Neutrophils Relative %: 85 %
Platelets: 512 K/uL — ABNORMAL HIGH (ref 150–400)
RBC: 4.33 MIL/uL (ref 4.22–5.81)
RDW: 19 % — ABNORMAL HIGH (ref 11.5–15.5)
WBC: 11.7 K/uL — ABNORMAL HIGH (ref 4.0–10.5)
nRBC: 0.4 % — ABNORMAL HIGH (ref 0.0–0.2)

## 2024-12-24 LAB — PROTIME-INR
INR: 1.4 — ABNORMAL HIGH (ref 0.8–1.2)
Prothrombin Time: 18.1 s — ABNORMAL HIGH (ref 11.4–15.2)

## 2024-12-24 LAB — I-STAT CG4 LACTIC ACID, ED
Lactic Acid, Venous: >15 mmol/L (ref 0.5–1.9)
Lactic Acid, Venous: 11.9 mmol/L (ref 0.5–1.9)
Lactic Acid, Venous: 10.2 mmol/L (ref 0.5–1.9)

## 2024-12-24 LAB — CBG MONITORING, ED
Glucose-Capillary: >600 mg/dL (ref 70–99)
Glucose-Capillary: 598 mg/dL (ref 70–99)
Glucose-Capillary: 549 mg/dL (ref 70–99)
Glucose-Capillary: 579 mg/dL (ref 70–99)

## 2024-12-24 LAB — OSMOLALITY: Osmolality: 376 mosm/kg (ref 275–295)

## 2024-12-24 LAB — BETA-HYDROXYBUTYRIC ACID
Beta-Hydroxybutyric Acid: 1.14 mmol/L — ABNORMAL HIGH (ref 0.05–0.27)
Beta-Hydroxybutyric Acid: 0.81 mmol/L — ABNORMAL HIGH (ref 0.05–0.27)

## 2024-12-24 MED ORDER — DEXTROSE IN LACTATED RINGERS 5 % IV SOLN: INTRAVENOUS | Status: DC

## 2024-12-24 MED ORDER — LACTATED RINGERS IV BOLUS
1000.0000 mL | Freq: Once | INTRAVENOUS | Status: AC
  Administered 2024-12-24: 1000 mL via INTRAVENOUS

## 2024-12-24 MED ORDER — INSULIN REGULAR(HUMAN) IN NACL 100-0.9 UT/100ML-% IV SOLN
INTRAVENOUS | Status: DC
  Administered 2024-12-24: 12 [IU]/h via INTRAVENOUS
  Filled 2024-12-24: qty 100

## 2024-12-24 MED ORDER — LACTATED RINGERS IV SOLN: INTRAVENOUS | Status: DC

## 2024-12-24 MED ORDER — DOCUSATE SODIUM 100 MG PO CAPS: 100.0000 mg | ORAL_CAPSULE | Freq: Two times a day (BID) | ORAL | Status: DC | PRN

## 2024-12-24 MED ORDER — ONDANSETRON HCL 4 MG/2ML IJ SOLN
4.0000 mg | Freq: Once | INTRAMUSCULAR | Status: AC
  Administered 2024-12-24: 4 mg via INTRAVENOUS

## 2024-12-24 MED ORDER — METRONIDAZOLE 500 MG/100ML IV SOLN
500.0000 mg | Freq: Once | INTRAVENOUS | Status: AC
  Administered 2024-12-24: 500 mg via INTRAVENOUS
  Filled 2024-12-24: qty 100

## 2024-12-24 MED ORDER — ACETAMINOPHEN 650 MG RE SUPP
RECTAL | Status: AC
  Administered 2024-12-24: 650 mg via RECTAL
  Filled 2024-12-24: qty 1

## 2024-12-24 MED ORDER — HEPARIN SODIUM (PORCINE) 5000 UNIT/ML IJ SOLN
5000.0000 [IU] | Freq: Three times a day (TID) | INTRAMUSCULAR | Status: AC
  Administered 2024-12-25 – 2025-01-02 (×24): 5000 [IU] via SUBCUTANEOUS
  Filled 2024-12-24 (×4): qty 1

## 2024-12-24 MED ORDER — CHLORHEXIDINE GLUCONATE CLOTH 2 % EX PADS
6.0000 | MEDICATED_PAD | Freq: Every day | CUTANEOUS | Status: DC
  Administered 2024-12-25 – 2025-01-02 (×10): 6 via TOPICAL

## 2024-12-24 MED ORDER — LACTATED RINGERS IV BOLUS (SEPSIS)
1000.0000 mL | Freq: Once | INTRAVENOUS | Status: AC
  Administered 2024-12-24: 1000 mL via INTRAVENOUS

## 2024-12-24 MED ORDER — INSULIN REGULAR(HUMAN) IN NACL 100-0.9 UT/100ML-% IV SOLN
INTRAVENOUS | Status: DC
  Administered 2024-12-25: 32 [IU]/h via INTRAVENOUS
  Administered 2024-12-25: 16 [IU]/h via INTRAVENOUS
  Administered 2024-12-25: 22 [IU]/h via INTRAVENOUS

## 2024-12-24 MED ORDER — NOREPINEPHRINE 4 MG/250ML-% IV SOLN
0.0000 ug/min | INTRAVENOUS | Status: DC
  Administered 2024-12-25 (×5): 40 ug/min via INTRAVENOUS
  Administered 2024-12-25: 39 ug/min via INTRAVENOUS
  Filled 2024-12-24: qty 250

## 2024-12-24 MED ORDER — VASOPRESSIN 20 UNITS/100 ML INFUSION FOR SHOCK
0.0000 [IU]/min | INTRAVENOUS | Status: DC
  Administered 2024-12-24 – 2024-12-26 (×3): 0.03 [IU]/min via INTRAVENOUS

## 2024-12-24 MED ORDER — VANCOMYCIN HCL 1750 MG/350ML IV SOLN
1750.0000 mg | Freq: Once | INTRAVENOUS | Status: AC
  Administered 2024-12-24: 1750 mg via INTRAVENOUS
  Filled 2024-12-24: qty 350

## 2024-12-24 MED ORDER — SODIUM CHLORIDE 0.9 % IV SOLN
2.0000 g | Freq: Once | INTRAVENOUS | Status: DC
  Filled 2024-12-24: qty 10

## 2024-12-24 MED ORDER — NOREPINEPHRINE 4 MG/250ML-% IV SOLN
INTRAVENOUS | Status: AC
  Administered 2024-12-24: 10 ug/min via INTRAVENOUS
  Filled 2024-12-24: qty 250

## 2024-12-24 MED ORDER — VANCOMYCIN HCL IN DEXTROSE 1-5 GM/200ML-% IV SOLN: 1000.0000 mg | Freq: Once | INTRAVENOUS | Status: DC

## 2024-12-24 MED ORDER — ACETAMINOPHEN 650 MG RE SUPP: 650.0000 mg | Freq: Once | RECTAL | Status: AC

## 2024-12-24 MED ORDER — LACTATED RINGERS IV BOLUS
20.0000 mL/kg | Freq: Once | INTRAVENOUS | Status: AC
  Administered 2024-12-24: 1640 mL via INTRAVENOUS

## 2024-12-24 MED ORDER — DEXTROSE 50 % IV SOLN: 0.0000 mL | INTRAVENOUS | Status: DC | PRN

## 2024-12-24 MED ORDER — POTASSIUM CHLORIDE 10 MEQ/100ML IV SOLN
10.0000 meq | INTRAVENOUS | Status: AC
  Administered 2024-12-24 (×2): 10 meq via INTRAVENOUS
  Filled 2024-12-24 (×2): qty 100

## 2024-12-24 MED ORDER — POLYETHYLENE GLYCOL 3350 17 G PO PACK: 17.0000 g | PACK | Freq: Every day | ORAL | Status: DC | PRN

## 2024-12-24 MED ORDER — SODIUM CHLORIDE 0.9 % IV SOLN
1.0000 g | Freq: Once | INTRAVENOUS | Status: AC
  Administered 2024-12-24: 1 g via INTRAVENOUS
  Filled 2024-12-24: qty 20

## 2024-12-24 NOTE — ED Triage Notes (Addendum)
 Pt bib [facility] from [facility] for hypotension and hyperglycemia. Pt is trach/vented, chronic foley.  Lactic 6, abg as follows ph 7.35 / co2 56.3   Last bp 60/20s /  hr 150s / temp 103

## 2024-12-24 NOTE — H&P (Signed)
 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  He was admitted 12/8 with Sepsis as well and d/c on 12/12.  He was treated for severe sepsis, Pneumonia and UTI and he was treated with Meropenem . He now presents again with fevers, hypotension requiring 35 mcg Levophed  and Vasopressin .  Patient found perspiring and mottled on peripheral Levophed  and Vasopressin .  Also BG >700.  NA 154, Cr 1.70, LA 11.9, WBC 11.7, HgB 11.9, Plt 512, BHA 1.14. 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 rate 20, height 6' (1.829 m), weight 82 kg, SpO2 96%.       No intake or output data in the 24 hours ending 12/24/24 2207 Filed Weights   12/24/24 1944  Weight: 82 kg    Examination: General: chronic trach and vent in distress, sweaty HENT: pupils reactive, no icterus Lungs: course, no rales Cardiovascular: reg s1s2 no murmurs Abdomen: soft peg pos, no guarding, bs pos Extremities: no edema but mottled LE b/l, cold feet and cyanotic toes Neuro: unresponsive, trach/vent sedated   Resolved problem list   Assessment and Plan  Septic shock Currently on  multiple pressors, wean as possible Broad spectrum antibiotics Aggressive IV fluids Pan culture Acute hypoxic respiratory failure Vent management Wean as tolerated Trach dependent Trach care SAT/SBT when medically appropriate Pneumonia-HCAP Antibiotics to cover Pseudomonas Tracheal aspirates for cultures DKA Insulin  drip BG q 1 hr BMP and BHB Lactic acidosis Trend levels AKI Should improve with aggressive IV fluids and treatment of Sepsis Avoid nephrotoxic agents Hypothyroid Check TSH  Labs   CBC: Recent Labs  Lab 12/24/24 2030 12/24/24 2037 12/24/24 2038 12/24/24 2116  WBC 11.7*  --   --   --   NEUTROABS 9.9*  --   --   --   HGB 11.9* 13.6 13.3 12.2*  HCT 44.0 40.0 39.0 36.0*  MCV 101.6*  --   --   --   PLT 512*  --   --   --     Basic Metabolic Panel: Recent Labs  Lab 12/24/24 2023 12/24/24 2037 12/24/24 2038 12/24/24 2116  NA 154* 154* 154* 153*  K 4.4 4.2 4.1 4.0  CL 107 112*  --   --   CO2 19*  --   --   --   GLUCOSE 763* >700*  --   --   BUN 69* 76*  --   --   CREATININE 1.80* 1.70*  --   --   CALCIUM 10.2  --   --   --    GFR: Estimated Creatinine Clearance: 43.1 mL/min (A) (by C-G formula based on SCr of 1.7 mg/dL (H)). Recent Labs  Lab 12/24/24 2021  12/24/24 2030 12/24/24 2038  WBC  --  11.7*  --   LATICACIDVEN >15.0*  --  11.9*    Liver Function Tests: Recent Labs  Lab 12/24/24 2023  AST 114*  ALT 53*  ALKPHOS 140*  BILITOT 0.7  PROT 8.7*  ALBUMIN 3.2*   No results for input(s): LIPASE, AMYLASE in the last 168 hours. No results for input(s): AMMONIA in the last 168 hours.  ABG    Component Value Date/Time   PHART 7.250 (L) 12/24/2024 2116   PCO2ART 44.6 12/24/2024 2116   PO2ART 186 (H) 12/24/2024 2116   HCO3 18.6 (L) 12/24/2024 2116   TCO2 20 (L) 12/24/2024 2116   ACIDBASEDEF 7.0 (H) 12/24/2024 2116   O2SAT 99 12/24/2024 2116     Coagulation Profile: Recent Labs  Lab 12/24/24 2023  INR 1.4*    Cardiac  Enzymes: No results for input(s): CKTOTAL, CKMB, CKMBINDEX, TROPONINI in the last 168 hours.  HbA1C: Hgb A1c MFr Bld  Date/Time Value Ref Range Status  10/13/2024 02:58 PM 7.7 (H) 4.8 - 5.6 % Final    Comment:    (NOTE) Diagnosis of Diabetes The following HbA1c ranges recommended by the American Diabetes Association (ADA) may be used as an aid in the diagnosis of diabetes mellitus.  Hemoglobin             Suggested A1C NGSP%              Diagnosis  <5.7                   Non Diabetic  5.7-6.4                Pre-Diabetic  >6.4                   Diabetic  <7.0                   Glycemic control for                       adults with diabetes.    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   Surgical History:   Past Surgical History:  Procedure Laterality Date   CHOLECYSTECTOMY     CRANIECTOMY     JEJUNOSTOMY FEEDING TUBE     LEFT HEART CATH AND CORONARY ANGIOGRAPHY N/A 01/22/2020   Procedure: LEFT HEART CATH AND CORONARY ANGIOGRAPHY and possible PCI and stent;  Surgeon: Florencio Cara BIRCH, MD;  Location: [facility] INVASIVE CV LAB;  Service: Cardiovascular;  Laterality: N/A;   tracheostomy       Social History:   reports that he has quit smoking. He has never used smokeless tobacco. He reports that he does not currently use alcohol . He reports that he does  not currently use drugs.   Family History:  His  family history is not on file.   Allergies Allergies[1]   Home Medications  Prior to Admission medications  Medication Sig Start Date End Date Taking? Authorizing Provider  acetaminophen  (TYLENOL ) 160 MG/5ML solution Place 20.3 mLs (650 mg total) into feeding tube every 6 (six) hours as needed for fever. 10/17/24   Sherrill Cable Latif, DO  amantadine  (SYMMETREL ) 50 MG/5ML solution Place 100 mg into feeding tube daily.    [provider]  artificial tears ophthalmic solution Place 2 drops into both eyes as needed for dry eyes. 10/17/24   Sherrill Cable Latif, DO  ascorbic acid  (VITAMIN C ) 500 MG tablet Place 500 mg into feeding tube daily. Give 500mg  per tube daily    [provider]  Bempedoic Acid (NEXLETOL) 180 MG TABS Take 180 mg by mouth in the morning.    [provider]  cetirizine (ZYRTEC) 10 MG tablet Place 10 mg into feeding tube daily.    [provider]  Dextrose , Diabetic Use, (DEX4 FAST ACTING GLUCOSE PO) Take 15 g by mouth as needed (for hypoglycemia).    [provider]  docusate sodium  (COLACE) 100 MG capsule Take 1 capsule (100 mg total) by mouth 2 (two) times daily as needed for mild constipation. Patient taking differently: 100 mg 2 (two) times daily as needed for mild constipation. 100mg  per tube 10/17/24   Sheikh, Cable Latif, DO  enoxaparin  (LOVENOX ) 40 MG/0.4ML injection Inject 40 mg into the skin daily.    [provider]  ferrous sulfate  300 (60 Fe) MG/5ML syrup Place 300 mg into feeding tube 2 (two) times daily.    [provider]  furosemide  (LASIX ) 20 MG tablet Place 1 tablet (20 mg total) into feeding tube daily. 10/17/24   Sheikh, Cable Latif, DO  gatifloxacin  (ZYMAXID ) 0.5 % SOLN Place 1 drop into both eyes 4 (four) times daily. 10/17/24   Sheikh, Omair Latif, DO  Glucagon (GVOKE HYPOPEN 1-PACK) 1 MG/0.2ML SOAJ Inject 1 mg into the skin as needed (for hypoglycemia).    [provider]  glucagon  Emergency 1 MG SOLR Inject 1 mg into the muscle as needed (for hypoglycemia).    [provider]  insulin  aspart (FIASP  FLEXTOUCH) 100 UNIT/ML FlexTouch Pen Inject 0-15 Units into the skin every 6 (six) hours. Sliding scale insulin : 70-120= 0 units, 121-150= 2 units, 151-200= 3 units, 201-250= 5 units, 251-300= 8 units, 301-350= 11 units, 351-400= 15 units    [provider]  insulin  glargine-yfgn (SEMGLEE ) 100 UNIT/ML Pen Inject 15 Units into the skin at bedtime. 12/08/24 01/07/25  Lue Elsie BROCKS, MD  lansoprazole (PREVACID SOLUTAB) 30 MG disintegrating tablet Place 30 mg into feeding tube daily at 12 noon.    [provider]  levETIRAcetam  (KEPPRA ) 500 MG tablet Place 1 tablet (500 mg total) into feeding tube 2 (two) times daily. 01/27/23   Nguyen, Quan, DO  levothyroxine  (SYNTHROID ) 100 MCG tablet Place 100 mcg into feeding tube daily. Give 100mcg per tube daily    [provider]  liothyronine  (CYTOMEL ) 5 MCG tablet Place 7.5 mcg into feeding tube every 12 (twelve) hours. Give one and one half tablet (7.5mg ) per tube every 12 hours    [provider]  Methylcellulose, Laxative, (CITRUCEL) 500 MG TABS Place 1 tablet (500 mg total) into feeding tube 3 (three) times daily as needed (Constipation). 10/17/24   Sherrill Cable Latif, DO  midodrine  (PROAMATINE ) 10 MG tablet  Place 1 tablet (10 mg total) into feeding tube 3 (three) times daily with meals. 10/17/24   Sherrill Cable Latif, DO  modafinil  (PROVIGIL ) 100 MG tablet GIVE 1 TAB VIA TUBE ONCE DAILY 11/03/24   Fleeta Finger, Selinda, MD  Mouthwashes (MOUTH RINSE) LIQD solution 15 mLs by Mouth Rinse route 4 (four) times daily. Patient not taking: Reported on 12/03/2024 10/17/24   Sherrill Cable Latif, DO  Nutritional Supplements (FEEDING SUPPLEMENT, GLUCERNA 1.5 CAL,) LIQD Place 60 mL/hr into feeding tube See admin instructions. Give 13mL/hr per tube by shift    [provider]  omega-3 acid ethyl esters  (LOVAZA ) 1 g capsule Place 1 g into feeding tube daily. Give 1 g per tube daily    [provider]  ondansetron  (ZOFRAN -ODT) 4 MG disintegrating tablet 4 mg every 6 (six) hours as needed for nausea or vomiting. Per tube    [provider]  polyethylene glycol (MIRALAX  / GLYCOLAX ) 17 g packet Give 17g per tube daily as needed for constipation    [provider]  sertraline  (ZOLOFT ) 25 MG tablet Place 25 mg into feeding tube daily. Give 25mg  per tube daily    [provider]  sodium chloride  irrigation 0.9 % irrigation Irrigate with 60 mLs as directed every 8 (eight) hours. Irrigate indwelling foley with 60mL sterile saline every 8 hours.    [provider]     Critical care time: 17   The patient is critically ill with multiple organ system failure and requires high complexity decision making for assessment and support, frequent evaluation and titration of therapies, advanced monitoring, review of radiographic studies and interpretation of complex data.   Critical Care Time devoted to patient care services, exclusive of separately billable procedures, described in this note is 38 minutes.   Orlin Fairly, MD [facility] See Amion for pager  If no response to pager , please call [phone] until 7pm After 7:00 pm call Elink  [phone] 12/24/2024, 10:07 PM            [1]  Allergies Allergen Reactions   Morphine And Codeine Other (See Comments)    Not specified on MAR    Penicillins Other (See Comments)    Has tolerated Cefepime  and Ceftriaxone    Cefepime      Unknown reaction per [facility]   Statins Nausea And Vomiting   "

## 2024-12-24 NOTE — ED Notes (Signed)
 Date and time results received: 12/24/2024 2129 (use smartphrase .now to insert current time)  Test: glucose Critical Value: 763  Name of Provider Notified: m belfi md  Orders Received? Or Actions Taken?: n./a

## 2024-12-24 NOTE — Sepsis Progress Note (Signed)
 Elink following for sepsis protocol.

## 2024-12-24 NOTE — Procedures (Signed)
 Arterial Catheter Insertion Procedure Note  BRYLEY KOVACEVIC  [id number]  1951-08-11  Date:12/24/2024  Time:10:05 PM    Provider Performing: Leita JONELLE Tim Corriher    Procedure: Insertion of Arterial Line (63379) with US  guidance (23062)   Indication(s) Blood pressure monitoring and/or need for frequent ABGs  Consent Unable to obtain consent due to emergent nature of procedure.  Anesthesia None   Time Out Verified patient identification, verified procedure, site/side was marked, verified correct patient position, special equipment/implants available, medications/allergies/relevant history reviewed, required imaging and test results available.   Sterile Technique Maximal sterile technique including full sterile barrier drape, hand hygiene, sterile gown, sterile gloves, mask, hair covering, sterile ultrasound probe cover (if used).   Procedure Description Area of catheter insertion was cleaned with chlorhexidine  and draped in sterile fashion. With real-time ultrasound guidance an arterial catheter was placed into the right radial artery.  Appropriate arterial tracings confirmed on monitor.     Complications/Tolerance None; patient tolerated the procedure well.   EBL Minimal   Specimen(s) None   Leita JONELLE Brylan Seubert, PA-C

## 2024-12-24 NOTE — Procedures (Signed)
 Arterial Catheter Insertion Procedure Note  Joshua Haley  [id number]  [birth year]  Date:12/24/2024  Time:10:26 PM    Provider Performing: Orlin Fairly    Procedure: Insertion of Arterial Line (63379) with US  guidance (23062)   Indication(s) Blood pressure monitoring and/or need for frequent ABGs  Consent Unable to obtain consent due to emergent nature of procedure.  Anesthesia 1% local Lidocaine   Time Out Verified patient identification, verified procedure, site/side was marked, verified correct patient position, special equipment/implants available, medications/allergies/relevant history reviewed, required imaging and test results available.   Sterile Technique Maximal sterile technique including full sterile barrier drape, hand hygiene, sterile gown, sterile gloves, mask, hair covering, sterile ultrasound probe cover (if used).   Procedure Description Area of catheter insertion was cleaned with chlorhexidine  and draped in sterile fashion. With real-time ultrasound guidance an arterial catheter was placed into the right femoral artery.  Appropriate arterial tracings confirmed on monitor.     Complications/Tolerance None; patient tolerated the procedure well.   EBL Minimal   Specimen(s) None

## 2024-12-24 NOTE — ED Provider Notes (Signed)
 " [facility] [place] [facility] Provider Note   CSN: [id number] Arrival date & time: 12/24/24  1940     Patient presents with: Code Sepsis   Joshua Haley is a 73 y.o. male.   HPI Patient is a 73 year old male with a history of TBI with aphasia and hemiparesis, tracheostomy, G-tube, diabetes, prior ESBL infections, Pseudomonas.  He presents from [facility] nursing facility with possible sepsis.  He was noted to have a fever of 102 there as well as hyperglycemia with a glucose of 500.  No other history is obtainable.  [facility] to transfer the patient did say that they were told that the fluids patient was getting was LR but when they arrived to our facility, and they noticed it was D5 normal saline.  He got about a 500 cc bolus of this.  He was recently admitted to the hospital in December for sepsis related to aspiration pneumonia and possibly UTI.  He was on 5 days of meropenem .  [facility] had noted the patient to be hypotensive with blood pressures in the 60s systolic and heart rates in the 150s.  He was tachycardic here with a rectal temp of 106.9 but his BP was normal.    Prior to Admission medications  Medication Sig Start Date End Date Taking? Authorizing Provider  acetaminophen  (TYLENOL ) 160 MG/5ML solution Place 20.3 mLs (650 mg total) into feeding tube every 6 (six) hours as needed for fever. 10/17/24   Sherrill Cable Latif, DO  amantadine  (SYMMETREL ) 50 MG/5ML solution Place 100 mg into feeding tube daily.    [provider]  artificial tears ophthalmic solution Place 2 drops into both eyes as needed for dry eyes. 10/17/24   Sherrill Cable Latif, DO  ascorbic acid  (VITAMIN C ) 500 MG tablet Place 500 mg into feeding tube daily. Give 500mg  per tube daily    [provider]  Bempedoic Acid (NEXLETOL) 180 MG TABS Take 180 mg by mouth in the morning.    [provider]  cetirizine (ZYRTEC) 10 MG tablet Place 10 mg into feeding tube daily.     [provider]  Dextrose , Diabetic Use, (DEX4 FAST ACTING GLUCOSE PO) Take 15 g by mouth as needed (for hypoglycemia).    [provider]  docusate sodium  (COLACE) 100 MG capsule Take 1 capsule (100 mg total) by mouth 2 (two) times daily as needed for mild constipation. Patient taking differently: 100 mg 2 (two) times daily as needed for mild constipation. 100mg  per tube 10/17/24   Sheikh, Cable Latif, DO  enoxaparin  (LOVENOX ) 40 MG/0.4ML injection Inject 40 mg into the skin daily.    [provider]  ferrous sulfate  300 (60 Fe) MG/5ML syrup Place 300 mg into feeding tube 2 (two) times daily.    [provider]  furosemide  (LASIX ) 20 MG tablet Place 1 tablet (20 mg total) into feeding tube daily. 10/17/24   Sheikh, Cable Latif, DO  gatifloxacin  (ZYMAXID ) 0.5 % SOLN Place 1 drop into both eyes 4 (four) times daily. 10/17/24   Sheikh, Omair Latif, DO  Glucagon (GVOKE HYPOPEN 1-PACK) 1 MG/0.2ML SOAJ Inject 1 mg into the skin as needed (for hypoglycemia).    [provider]  glucagon Emergency 1 MG SOLR Inject 1 mg into the muscle as needed (for hypoglycemia).    [provider]  insulin  aspart (FIASP  FLEXTOUCH) 100 UNIT/ML FlexTouch Pen Inject 0-15 Units into the skin every 6 (six) hours. Sliding scale insulin : 70-120= 0 units, 121-150=  2 units, 151-200= 3 units, 201-250= 5 units, 251-300= 8 units, 301-350= 11 units, 351-400= 15 units    [provider]  insulin  glargine-yfgn (SEMGLEE ) 100 UNIT/ML Pen Inject 15 Units into the skin at bedtime. 12/08/24 01/07/25  Lue Elsie BROCKS, MD  lansoprazole (PREVACID SOLUTAB) 30 MG disintegrating tablet Place 30 mg into feeding tube daily at 12 noon.    [provider]  levETIRAcetam  (KEPPRA ) 500 MG tablet Place 1 tablet (500 mg total) into feeding tube 2 (two) times daily. 01/27/23   Nguyen, Quan, DO  levothyroxine  (SYNTHROID ) 100 MCG tablet Place 100 mcg into feeding tube daily. Give 100mcg  per tube daily    [provider]  liothyronine  (CYTOMEL ) 5 MCG tablet Place 7.5 mcg into feeding tube every 12 (twelve) hours. Give one and one half tablet (7.5mg ) per tube every 12 hours    [provider]  Methylcellulose, Laxative, (CITRUCEL) 500 MG TABS Place 1 tablet (500 mg total) into feeding tube 3 (three) times daily as needed (Constipation). 10/17/24   Sherrill Cable Latif, DO  midodrine  (PROAMATINE ) 10 MG tablet Place 1 tablet (10 mg total) into feeding tube 3 (three) times daily with meals. 10/17/24   Sherrill Cable Latif, DO  modafinil  (PROVIGIL ) 100 MG tablet GIVE 1 TAB VIA TUBE ONCE DAILY 11/03/24   Fleeta Finger, Selinda, MD  Mouthwashes (MOUTH RINSE) LIQD solution 15 mLs by Mouth Rinse route 4 (four) times daily. Patient not taking: Reported on 12/03/2024 10/17/24   Sherrill Cable Latif, DO  Nutritional Supplements (FEEDING SUPPLEMENT, GLUCERNA 1.5 CAL,) LIQD Place 60 mL/hr into feeding tube See admin instructions. Give 27mL/hr per tube by shift    [provider]  omega-3 acid ethyl esters (LOVAZA ) 1 g capsule Place 1 g into feeding tube daily. Give 1 g per tube daily    [provider]  ondansetron  (ZOFRAN -ODT) 4 MG disintegrating tablet 4 mg every 6 (six) hours as needed for nausea or vomiting. Per tube    [provider]  polyethylene glycol (MIRALAX  / GLYCOLAX ) 17 g packet Give 17g per tube daily as needed for constipation    [provider]  sertraline  (ZOLOFT ) 25 MG tablet Place 25 mg into feeding tube daily. Give 25mg  per tube daily    [provider]  sodium chloride  irrigation 0.9 % irrigation Irrigate with 60 mLs as directed every 8 (eight) hours. Irrigate indwelling foley with 60mL sterile saline every 8 hours.    [provider]    Allergies: Morphine and codeine, Penicillins, Cefepime , and Statins    Review of Systems  Unable to perform ROS: Patient unresponsive    Updated Vital Signs BP 137/77   Pulse  (!) 147   Temp (!) 106.9 F (41.6 C) (Rectal)   Resp (!) 38   Ht 6' (1.829 m)   Wt 82 kg   BMI 24.52 kg/m   Physical Exam Constitutional:      Appearance: He is well-developed. He is ill-appearing.  HENT:     Head: Normocephalic and atraumatic.  Eyes:     Pupils: Pupils are equal, round, and reactive to light.  Neck:     Comments: Tracheostomy tube in place Cardiovascular:     Rate and Rhythm: Regular rhythm. Tachycardia present.     Heart sounds: Normal heart sounds.  Pulmonary:     Effort: Pulmonary effort is normal. No respiratory distress.     Breath sounds: Normal breath sounds. No wheezing or rales.  Chest:  Chest wall: No tenderness.  Abdominal:     General: Bowel sounds are normal.     Palpations: Abdomen is soft.     Tenderness: There is no abdominal tenderness. There is no guarding or rebound.     Comments: Loose but not watery brown stool in his diaper  Genitourinary:    Comments: Foley catheter in place Musculoskeletal:        General: Normal range of motion.     Cervical back: Normal range of motion and neck supple.     Comments: Decubitus wounds to his left ankle and his buttocks but no redness or obvious signs of infection  Lymphadenopathy:     Cervical: No cervical adenopathy.  Skin:    General: Skin is warm and dry.     Findings: No rash.     Comments: Mottling of the skin  Neurological:     Mental Status: He is alert and oriented to person, place, and time.     (all labs ordered are listed, but only abnormal results are displayed) Labs Reviewed  RESP PANEL BY RT-PCR (RSV, FLU A&B, COVID)  [id number]  CULTURE, BLOOD (ROUTINE X 2)  CULTURE, BLOOD (ROUTINE X 2)  COMPREHENSIVE METABOLIC PANEL WITH GFR  CBC WITH DIFFERENTIAL/PLATELET  PROTIME-INR  URINALYSIS, W/ REFLEX TO CULTURE (INFECTION SUSPECTED)  I-STAT CG4 LACTIC ACID, ED  I-STAT CHEM 8, ED    EKG: None  Radiology: No results found.   SABRAUltrasound ED Peripheral IV  (Provider)  Date/Time: 12/24/2024 9:27 PM  Performed by: Lenor Hollering, MD Authorized by: Lenor Hollering, MD   Procedure details:    Indications: poor IV access     Skin Prep: chlorhexidine  gluconate     Location:  Right AC   Angiocath:  20 G   Bedside Ultrasound Guided: Yes     Images: not archived     Patient tolerated procedure without complications: Yes     Dressing applied: Yes      Medications Ordered in the ED  lactated ringers  infusion (has no administration in time range)  lactated ringers  bolus 1,000 mL (has no administration in time range)  aztreonam  (AZACTAM ) 2 g in sodium chloride  0.9 % 100 mL IVPB (has no administration in time range)  metroNIDAZOLE  (FLAGYL ) IVPB 500 mg (has no administration in time range)  vancomycin  (VANCOREADY) IVPB 1750 mg/350 mL (has no administration in time range)  acetaminophen  (TYLENOL ) suppository 650 mg (650 mg Rectal Given 12/24/24 1958)                                    Medical Decision Making Amount and/or Complexity of Data Reviewed Labs: ordered. Radiology: ordered.  Risk OTC drugs. Prescription drug management.   This patient presents to the ED for concern of fever, hypertension, this involves an extensive number of treatment options, and is a complaint that carries with it a high risk of complications and morbidity.  I considered the following differential and admission for this acute, potentially life threatening condition.  The differential diagnosis includes sepsis, dehydration, pneumonia, viral infection, UTI, osteomyelitis related to decubitus wounds, intra-abdominal infection  MDM:    Patient is a 73 year old vent dependent male from [facility] long-[facility] facility who presents with febrile illness.  He initially was reading a normal blood pressure but on recheck was noted to be hypotensive with systolic blood pressures in the 60s and 70s.  Was started on Levophed .  His blood pressure dropped on the Levophed  and  vasopressin  was added.  He was treated for sepsis with IV fluids and IV antibiotics.  Given his prior infections, pharmacy recommends meropenem  which was started.  He is noted to be hyperglycemic as well with acidosis and evidence of DKA.  I will start him on insulin  via Endo tool.  Chest x-ray shows evidence of pneumonia.  CT scan of the chest abdomen pelvis has been ordered but he is currently not quite stabilized enough to go for CT.  Discussed with Dr. Maree with critical care who will admit the patient.  CRITICAL CARE Performed by: Andrea Ness Total critical care time: 80 minutes Critical care time was exclusive of separately billable procedures and treating other patients. Critical care was necessary to treat or prevent imminent or life-threatening deterioration. Critical care was time spent personally by me on the following activities: development of treatment plan with patient and/or surrogate as well as nursing, discussions with consultants, evaluation of patient's response to treatment, examination of patient, obtaining history from patient or surrogate, ordering and performing treatments and interventions, ordering and review of laboratory studies, ordering and review of radiographic studies, pulse oximetry and re-evaluation of patient's condition.   (Labs, imaging, consults)  Labs: I Ordered, and personally interpreted labs.  The pertinent results include: Elevated WBC count, elevated lactic acid greater than 15, mild elevation in his creatinine, hyperglycemia with acidosis and an anion gap of 28.  ABG shows normal oxygenation  Imaging Studies ordered: I ordered imaging studies including chest x-ray, CT chest, abdomen pelvis I independently visualized and interpreted imaging. I agree with the radiologist interpretation  Additional history obtained from chart.  External records from outside source obtained and reviewed including prior notes  Cardiac Monitoring: The patient was  maintained on a cardiac monitor.  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   metroNIDAZOLE  (FLAGYL ) IVPB 500 mg    Antibiotic Indication::   Other Indication (list below)    Other Indication::   Unknown Source.   DISCONTD: vancomycin  (VANCOCIN ) IVPB 1000 mg/200 mL premix    Indication::   Other Indication (list below)    Other Indication::   Unknown Source.   acetaminophen  (TYLENOL ) 650 MG suppository    [place], Caitlin : cabinet override   acetaminophen  (TYLENOL ) suppository 650 mg   vancomycin  (VANCOREADY) IVPB 1750 mg/350 mL    Indication::  Other Indication (list below)    Other Indication::   Unknown Source.   meropenem  (MERREM ) 1 g in sodium chloride  0.9 % 100 mL IVPB    Antibiotic Indication::   Sepsis    Other Indication::   Hx ESBL   norepinephrine  (LEVOPHED ) 4mg  in (0.016 mg/mL) premix infusion   norepinephrine  (LEVOPHED ) 4-5 MG/250ML-% infusion SOLN     [place], Caitlin : cabinet override   vasopressin  (PITRESSIN) 20 Units in 100 mL (0.2 unit/mL) infusion-*FOR SHOCK*   lactated ringers  bolus 1,000 mL   insulin  regular, human (MYXREDLIN ) 100 units/ 100 mL infusion    EndoTool Goal Range::   140-180    Type of Diabetes:   Type 2    Mode of Therapy:   ENDOX2 for HHS    Start Method:   EndoTool to calculate   lactated ringers  infusion   dextrose  5 % in lactated ringers  infusion   dextrose  50 % solution 0-50 mL   potassium chloride  10 mEq in 100 mL IVPB    I have reviewed the patients home medicines and have made adjustments as needed  Problem List / ED Course: Problem List Items Addressed This Visit       Other   Septic shock (HCC) - Primary   Relevant Medications   metroNIDAZOLE  (FLAGYL ) IVPB 500 mg   vancomycin  (VANCOREADY) IVPB 1750 mg/350 mL   Other Visit Diagnoses       Diabetic ketoacidosis without coma associated with type 2 diabetes mellitus (HCC)       Relevant Medications   insulin  regular, human (MYXREDLIN ) 100 units/ 100 mL infusion     Healthcare-associated pneumonia       Relevant Medications   metroNIDAZOLE  (FLAGYL ) IVPB 500 mg   vancomycin  (VANCOREADY) IVPB 1750 mg/350 mL   meropenem  (MERREM ) 1 g in sodium chloride  0.9 % 100 mL IVPB (Completed)                Final diagnoses:  None    ED Discharge Orders     None          Lenor Hollering, MD 12/24/24 2131  "

## 2024-12-24 NOTE — Procedures (Signed)
 Central Venous Catheter Insertion Procedure Note  LYTLE MALBURG  [id number]  Dec 11, 1951  Date:12/24/2024  Time:10:25 PM   Provider Performing:Franny Selvage Maree   Procedure: Insertion of Non-tunneled Central Venous Catheter(36556) with US  guidance (23062)   Indication(s) Medication administration  Consent Unable to obtain consent due to emergent nature of procedure.  Anesthesia Topical only with 1% lidocaine   Timeout Verified patient identification, verified procedure, site/side was marked, verified correct patient position, special equipment/implants available, medications/allergies/relevant history reviewed, required imaging and test results available.  Sterile Technique Maximal sterile technique including full sterile barrier drape, hand hygiene, sterile gown, sterile gloves, mask, hair covering, sterile ultrasound probe cover (if used).  Procedure Description Area of catheter insertion was cleaned with chlorhexidine  and draped in sterile fashion.  With real-time ultrasound guidance a central venous catheter was placed into the right femoral vein. Nonpulsatile blood flow and easy flushing noted in all ports.  The catheter was sutured in place and sterile dressing applied.  Complications/Tolerance None; patient tolerated the procedure well. Chest X-ray is ordered to verify placement for internal jugular or subclavian cannulation.   Chest x-ray is not ordered for femoral cannulation.  EBL Minimal  Specimen(s) None

## 2024-12-25 ENCOUNTER — Inpatient Hospital Stay (HOSPITAL_COMMUNITY): Payer: PRIVATE HEALTH INSURANCE

## 2024-12-25 LAB — BASIC METABOLIC PANEL WITH GFR
Anion gap: 23 — ABNORMAL HIGH (ref 5–15)
Anion gap: 20 — ABNORMAL HIGH (ref 5–15)
Anion gap: 18 — ABNORMAL HIGH (ref 5–15)
Anion gap: 14 (ref 5–15)
BUN: 64 mg/dL — ABNORMAL HIGH (ref 8–23)
BUN: 63 mg/dL — ABNORMAL HIGH (ref 8–23)
BUN: 60 mg/dL — ABNORMAL HIGH (ref 8–23)
BUN: 58 mg/dL — ABNORMAL HIGH (ref 8–23)
CO2: 18 mmol/L — ABNORMAL LOW (ref 22–32)
CO2: 20 mmol/L — ABNORMAL LOW (ref 22–32)
CO2: 20 mmol/L — ABNORMAL LOW (ref 22–32)
CO2: 22 mmol/L (ref 22–32)
Calcium: 9 mg/dL (ref 8.9–10.3)
Calcium: 9 mg/dL (ref 8.9–10.3)
Calcium: 9 mg/dL (ref 8.9–10.3)
Calcium: 9 mg/dL (ref 8.9–10.3)
Chloride: 107 mmol/L (ref 98–111)
Chloride: 106 mmol/L (ref 98–111)
Chloride: 105 mmol/L (ref 98–111)
Chloride: 107 mmol/L (ref 98–111)
Creatinine, Ser: 1.53 mg/dL — ABNORMAL HIGH (ref 0.61–1.24)
Creatinine, Ser: 1.46 mg/dL — ABNORMAL HIGH (ref 0.61–1.24)
Creatinine, Ser: 1.3 mg/dL — ABNORMAL HIGH (ref 0.61–1.24)
Creatinine, Ser: 1.21 mg/dL (ref 0.61–1.24)
GFR, Estimated: 48 mL/min — ABNORMAL LOW
GFR, Estimated: 51 mL/min — ABNORMAL LOW
GFR, Estimated: 58 mL/min — ABNORMAL LOW
GFR, Estimated: >60 mL/min
Glucose, Bld: 530 mg/dL (ref 70–99)
Glucose, Bld: 425 mg/dL — ABNORMAL HIGH (ref 70–99)
Glucose, Bld: 255 mg/dL — ABNORMAL HIGH (ref 70–99)
Glucose, Bld: 214 mg/dL — ABNORMAL HIGH (ref 70–99)
Potassium: 3.5 mmol/L (ref 3.5–5.1)
Potassium: 3.3 mmol/L — ABNORMAL LOW (ref 3.5–5.1)
Potassium: 2.9 mmol/L — ABNORMAL LOW (ref 3.5–5.1)
Potassium: 3.8 mmol/L (ref 3.5–5.1)
Sodium: 147 mmol/L — ABNORMAL HIGH (ref 135–145)
Sodium: 146 mmol/L — ABNORMAL HIGH (ref 135–145)
Sodium: 143 mmol/L (ref 135–145)
Sodium: 143 mmol/L (ref 135–145)

## 2024-12-25 LAB — BETA-HYDROXYBUTYRIC ACID
Beta-Hydroxybutyric Acid: 0.39 mmol/L — ABNORMAL HIGH (ref 0.05–0.27)
Beta-Hydroxybutyric Acid: 0.28 mmol/L — ABNORMAL HIGH (ref 0.05–0.27)
Beta-Hydroxybutyric Acid: 0.25 mmol/L (ref 0.05–0.27)
Beta-Hydroxybutyric Acid: 0.19 mmol/L (ref 0.05–0.27)

## 2024-12-25 LAB — MRSA NEXT GEN BY PCR, NASAL: MRSA by PCR Next Gen: DETECTED — AB

## 2024-12-25 LAB — CBG MONITORING, ED
Glucose-Capillary: 298 mg/dL — ABNORMAL HIGH (ref 70–99)
Glucose-Capillary: 346 mg/dL — ABNORMAL HIGH (ref 70–99)
Glucose-Capillary: 402 mg/dL — ABNORMAL HIGH (ref 70–99)
Glucose-Capillary: 409 mg/dL — ABNORMAL HIGH (ref 70–99)
Glucose-Capillary: 413 mg/dL — ABNORMAL HIGH (ref 70–99)
Glucose-Capillary: 448 mg/dL — ABNORMAL HIGH (ref 70–99)
Glucose-Capillary: 450 mg/dL — ABNORMAL HIGH (ref 70–99)
Glucose-Capillary: 503 mg/dL (ref 70–99)
Glucose-Capillary: 515 mg/dL (ref 70–99)
Glucose-Capillary: 530 mg/dL (ref 70–99)

## 2024-12-25 LAB — CBC
HCT: 34.2 % — ABNORMAL LOW (ref 39.0–52.0)
HCT: 34 % — ABNORMAL LOW (ref 39.0–52.0)
Hemoglobin: 9.5 g/dL — ABNORMAL LOW (ref 13.0–17.0)
Hemoglobin: 9.4 g/dL — ABNORMAL LOW (ref 13.0–17.0)
MCH: 27.5 pg (ref 26.0–34.0)
MCH: 26.8 pg (ref 26.0–34.0)
MCHC: 27.8 g/dL — ABNORMAL LOW (ref 30.0–36.0)
MCHC: 27.6 g/dL — ABNORMAL LOW (ref 30.0–36.0)
MCV: 99.1 fL (ref 80.0–100.0)
MCV: 96.9 fL (ref 80.0–100.0)
Platelets: 325 K/uL (ref 150–400)
Platelets: 310 K/uL (ref 150–400)
RBC: 3.45 MIL/uL — ABNORMAL LOW (ref 4.22–5.81)
RBC: 3.51 MIL/uL — ABNORMAL LOW (ref 4.22–5.81)
RDW: 18.1 % — ABNORMAL HIGH (ref 11.5–15.5)
RDW: 18.1 % — ABNORMAL HIGH (ref 11.5–15.5)
WBC: 11.8 K/uL — ABNORMAL HIGH (ref 4.0–10.5)
WBC: 13.9 K/uL — ABNORMAL HIGH (ref 4.0–10.5)
nRBC: 0.4 % — ABNORMAL HIGH (ref 0.0–0.2)
nRBC: 0.2 % (ref 0.0–0.2)

## 2024-12-25 LAB — GLUCOSE, CAPILLARY
Glucose-Capillary: 296 mg/dL — ABNORMAL HIGH (ref 70–99)
Glucose-Capillary: 231 mg/dL — ABNORMAL HIGH (ref 70–99)
Glucose-Capillary: 215 mg/dL — ABNORMAL HIGH (ref 70–99)
Glucose-Capillary: 218 mg/dL — ABNORMAL HIGH (ref 70–99)
Glucose-Capillary: 193 mg/dL — ABNORMAL HIGH (ref 70–99)
Glucose-Capillary: 172 mg/dL — ABNORMAL HIGH (ref 70–99)
Glucose-Capillary: 147 mg/dL — ABNORMAL HIGH (ref 70–99)
Glucose-Capillary: 134 mg/dL — ABNORMAL HIGH (ref 70–99)
Glucose-Capillary: 124 mg/dL — ABNORMAL HIGH (ref 70–99)
Glucose-Capillary: 126 mg/dL — ABNORMAL HIGH (ref 70–99)
Glucose-Capillary: 117 mg/dL — ABNORMAL HIGH (ref 70–99)
Glucose-Capillary: 138 mg/dL — ABNORMAL HIGH (ref 70–99)
Glucose-Capillary: 163 mg/dL — ABNORMAL HIGH (ref 70–99)

## 2024-12-25 LAB — MAGNESIUM: Magnesium: 1.8 mg/dL (ref 1.7–2.4)

## 2024-12-25 LAB — LACTIC ACID, PLASMA
Lactic Acid, Venous: >9 mmol/L (ref 0.5–1.9)
Lactic Acid, Venous: 8.5 mmol/L (ref 0.5–1.9)

## 2024-12-25 LAB — PHOSPHORUS: Phosphorus: 2.2 mg/dL — ABNORMAL LOW (ref 2.5–4.6)

## 2024-12-25 LAB — TSH: TSH: 1.9 u[IU]/mL (ref 0.350–4.500)

## 2024-12-25 MED ORDER — MUPIROCIN 2 % EX OINT
1.0000 | TOPICAL_OINTMENT | Freq: Two times a day (BID) | CUTANEOUS | Status: AC
  Administered 2024-12-25 – 2024-12-29 (×10): 1 via NASAL
  Filled 2024-12-25: qty 22

## 2024-12-25 MED ORDER — NOREPINEPHRINE 16 MG/250ML-% IV SOLN
0.0000 ug/min | INTRAVENOUS | Status: DC
  Administered 2024-12-25: 15 ug/min via INTRAVENOUS
  Administered 2024-12-25: 40 ug/min via INTRAVENOUS
  Filled 2024-12-25 (×2): qty 250

## 2024-12-25 MED ORDER — ORAL CARE MOUTH RINSE
15.0000 mL | OROMUCOSAL | Status: DC
  Administered 2024-12-25 – 2025-01-02 (×93): 15 mL via OROMUCOSAL

## 2024-12-25 MED ORDER — ACETAMINOPHEN 325 MG PO TABS
650.0000 mg | ORAL_TABLET | Freq: Four times a day (QID) | ORAL | Status: DC | PRN
  Administered 2024-12-26 – 2024-12-27 (×3): 650 mg
  Filled 2024-12-25 (×4): qty 2

## 2024-12-25 MED ORDER — VANCOMYCIN HCL 1250 MG/250ML IV SOLN: 1250.0000 mg | INTRAVENOUS | Status: DC

## 2024-12-25 MED ORDER — GLUCERNA 1.5 CAL PO LIQD
1000.0000 mL | ORAL | Status: DC
  Administered 2024-12-25 – 2024-12-26 (×2): 1000 mL
  Filled 2024-12-25 (×2): qty 1000

## 2024-12-25 MED ORDER — POTASSIUM CHLORIDE 10 MEQ/100ML IV SOLN
10.0000 meq | INTRAVENOUS | Status: AC
  Administered 2024-12-25 (×4): 10 meq via INTRAVENOUS
  Filled 2024-12-25 (×4): qty 100

## 2024-12-25 MED ORDER — MAGNESIUM SULFATE 2 GM/50ML IV SOLN
2.0000 g | Freq: Once | INTRAVENOUS | Status: AC
  Administered 2024-12-25: 2 g via INTRAVENOUS
  Filled 2024-12-25: qty 50

## 2024-12-25 MED ORDER — VANCOMYCIN HCL 1500 MG/300ML IV SOLN
1500.0000 mg | INTRAVENOUS | Status: DC
  Administered 2024-12-25: 1500 mg via INTRAVENOUS
  Filled 2024-12-25: qty 300

## 2024-12-25 MED ORDER — DAKINS (1/4 STRENGTH) 0.125 % EX SOLN
Freq: Every day | CUTANEOUS | Status: AC
  Administered 2024-12-28: 1
  Filled 2024-12-25: qty 473

## 2024-12-25 MED ORDER — POTASSIUM CHLORIDE 20 MEQ PO PACK
40.0000 meq | PACK | Freq: Once | ORAL | Status: AC
  Administered 2024-12-25: 40 meq
  Filled 2024-12-25: qty 2

## 2024-12-25 MED ORDER — MIDODRINE HCL 5 MG PO TABS
10.0000 mg | ORAL_TABLET | Freq: Three times a day (TID) | ORAL | Status: DC
  Administered 2024-12-25 – 2024-12-28 (×10): 10 mg
  Filled 2024-12-25 (×10): qty 2

## 2024-12-25 MED ORDER — VITAL HP 1.0 CAL PO LIQD: 1000.0000 mL | ORAL | Status: DC

## 2024-12-25 MED ORDER — JUVEN PO PACK
1.0000 | PACK | Freq: Two times a day (BID) | ORAL | Status: DC
  Administered 2024-12-25 – 2025-01-02 (×15): 1
  Filled 2024-12-25 (×15): qty 1

## 2024-12-25 MED ORDER — INSULIN GLARGINE 100 UNIT/ML ~~LOC~~ SOLN
8.0000 [IU] | SUBCUTANEOUS | Status: DC
  Administered 2024-12-25: 8 [IU] via SUBCUTANEOUS
  Filled 2024-12-25 (×2): qty 0.08

## 2024-12-25 MED ORDER — INSULIN GLARGINE-YFGN 100 UNIT/ML ~~LOC~~ SOLN: 8.0000 [IU] | SUBCUTANEOUS | Status: DC

## 2024-12-25 MED ORDER — ORAL CARE MOUTH RINSE: 15.0000 mL | OROMUCOSAL | Status: DC | PRN

## 2024-12-25 MED ORDER — SODIUM CHLORIDE 0.9 % IV SOLN
1.0000 g | Freq: Three times a day (TID) | INTRAVENOUS | Status: AC
  Administered 2024-12-25 – 2025-01-01 (×21): 1 g via INTRAVENOUS
  Filled 2024-12-25 (×21): qty 20

## 2024-12-25 MED ORDER — ADULT MULTIVITAMIN W/MINERALS CH
1.0000 | ORAL_TABLET | Freq: Every day | ORAL | Status: DC
  Administered 2024-12-25 – 2025-01-02 (×9): 1
  Filled 2024-12-25 (×9): qty 1

## 2024-12-25 MED ORDER — INSULIN ASPART 100 UNIT/ML IJ SOLN
0.0000 [IU] | INTRAMUSCULAR | Status: AC
  Administered 2024-12-25 – 2024-12-26 (×2): 4 [IU] via SUBCUTANEOUS
  Administered 2024-12-26 (×3): 7 [IU] via SUBCUTANEOUS
  Administered 2024-12-26 (×2): 4 [IU] via SUBCUTANEOUS
  Administered 2024-12-27: 3 [IU] via SUBCUTANEOUS
  Administered 2024-12-27: 7 [IU] via SUBCUTANEOUS
  Administered 2024-12-27: 3 [IU] via SUBCUTANEOUS
  Administered 2024-12-27 (×3): 7 [IU] via SUBCUTANEOUS
  Administered 2024-12-28 (×3): 3 [IU] via SUBCUTANEOUS
  Administered 2024-12-28: 4 [IU] via SUBCUTANEOUS
  Administered 2024-12-29 (×5): 3 [IU] via SUBCUTANEOUS
  Administered 2024-12-30: 4 [IU] via SUBCUTANEOUS
  Administered 2024-12-30 – 2025-01-02 (×11): 3 [IU] via SUBCUTANEOUS
  Filled 2024-12-25 (×2): qty 3
  Filled 2024-12-25: qty 7
  Filled 2024-12-25: qty 3
  Filled 2024-12-25: qty 7
  Filled 2024-12-25 (×5): qty 3
  Filled 2024-12-25: qty 7
  Filled 2024-12-25 (×4): qty 3
  Filled 2024-12-25: qty 4
  Filled 2024-12-25: qty 7
  Filled 2024-12-25: qty 4
  Filled 2024-12-25 (×4): qty 3
  Filled 2024-12-25: qty 7
  Filled 2024-12-25 (×4): qty 3
  Filled 2024-12-25 (×2): qty 4
  Filled 2024-12-25: qty 3
  Filled 2024-12-25 (×2): qty 7
  Filled 2024-12-25 (×2): qty 4
  Filled 2024-12-25: qty 3

## 2024-12-25 MED ORDER — LEVOTHYROXINE SODIUM 100 MCG PO TABS
100.0000 ug | ORAL_TABLET | Freq: Every day | ORAL | Status: DC
  Administered 2024-12-25 – 2025-01-02 (×9): 100 ug
  Filled 2024-12-25 (×9): qty 1

## 2024-12-25 MED ORDER — SODIUM CHLORIDE 0.9 % IV SOLN
1.0000 g | Freq: Two times a day (BID) | INTRAVENOUS | Status: DC
  Administered 2024-12-25: 1 g via INTRAVENOUS
  Filled 2024-12-25: qty 20

## 2024-12-25 NOTE — Consult Note (Signed)
 WOC Nurse Consult Note: Reason for Consult: Multiple tunneling wounds, see images in chart please  Wound type: L trochanter; Unstageable Pressure Injury Left ischium: Stage 3 Pressure Injury L heel; Unstageable Pressure injury Sacral; Stage 4 pressure injury R trochanter: Deep Tissue Pressure Injury Right ischium: Deep Tissue Pressure Injury Pressure Injury POA: Yes Measurement: see nursing flow sheets Wound bed: L trochanter: 100 % covered with yellow loose slough L heel: 100 % non-viable tissue Sacrum: 100 % red, moist L ischium: 100 % r Right trochanter: dark purple tissue; evidence of previous pressure injury Right ischium; dark purple; non blanchable tissue  Drainage (amount, consistency, odor) see nursing flow sheets Periwound; intact Dressing procedure/placement/frequency:   L trochanter;  cleanse with Carolynn Soila # [phone]), pat dry.  Pack with Dakins soaked  kerlix gauze using a cotton tipped applicator to advance into wound bed.  Cover with dry gauze and tape. L heel: offload at all times; cover with single layer of xeroform, top with foam. Change every other day. Sacral: Cleanse with NS, pat dry.  Pack wound with Saline soaked gauze and cover with silicone foam dressing.  Change daily. Right ischium; top with single layer of xeroform and foam; change every other day; lift to inspect for acute changes q shift Right trochanter; cleanse with saline, apply single layer of xeroform gauze and top with foam. Change every other day, lift to inspect for acute changes daily      LALM in place while in the ICU for moisture management and pressure redistribution. Needs air mattress ordered if transfers from the ICU Consult RD for wound supplementation; multiple POA PIs Prevalon boots bilaterally to offload heels at all times.    Re consult if needed, will not follow at this time. Thanks  Jamesen [employer], RN,CWOCN, CNS, [employer] [phone]

## 2024-12-25 NOTE — Progress Notes (Signed)
 Pharmacy Antibiotic Note  Joshua Haley is a 73 y.o. male admitted from [facility] on 12/24/2024 with fevers/hypotension/sepsis.  Pharmacy has been consulted for Vancomycin  and meropenem  dosing.  Scr dramatically improved with fluids - now 1.21. Dosing to change as follows.   Plan: Vancomycin  1500 mg q24h for AUC 456  Meropenem  1g IV 8h Will continue to follow   Height: 6' (182.9 cm) Weight: 83.6 kg (184 lb 4.9 oz) IBW/kg (Calculated) : 77.6  Temp (24hrs), Avg:102.5 F (39.2 C), Min:99.1 F (37.3 C), Max:106.9 F (41.6 C)  Recent Labs  Lab 12/24/24 2021 12/24/24 2023 12/24/24 2030 12/24/24 2037 12/24/24 2038 12/24/24 2306 12/24/24 2312 12/25/24 0200 12/25/24 0206 12/25/24 0457 12/25/24 0500 12/25/24 0902 12/25/24 1228  WBC  --   --  11.7*  --   --   --   --   --  11.8* 13.9*  --   --   --   CREATININE  --    < >  --    < >  --  1.73*  --   --  1.53* 1.46*  --  1.30* 1.21  LATICACIDVEN >15.0*  --   --   --  11.9*  --  10.2* >9.0*  --   --  8.5*  --   --    < > = values in this interval not displayed.    Estimated Creatinine Clearance: 60.6 mL/min (by C-G formula based on SCr of 1.21 mg/dL).    Allergies[1]  [name] 12/25/2024 2:49 PM      [1]  Allergies Allergen Reactions   Morphine And Codeine Other (See Comments)    Not specified on MAR    Penicillins Other (See Comments)    Has tolerated Cefepime  and Ceftriaxone    Cefepime  Other (See Comments)    Unknown reaction per [facility]   Statins Nausea And Vomiting

## 2024-12-25 NOTE — TOC Initial Note (Signed)
 Transition of Care [facility]) - Initial/Assessment Note    Patient Details  Name: Joshua Haley MRN: [id number] Date of Birth: 11/06/1951  Transition of Care [facility]) CM/SW Contact:    Lauraine FORBES Saa, LCSWA Phone Number: 12/25/2024, 10:05 AM  Clinical Narrative:                  10:05 AM Per chart review, patient is from [facility]. SNF confirmed patient is LTC at [facility]. Patient has a PCP and insurance. Patient's preferred pharmacy's are [facility], [facility], and [facility]. CSW will continue to follow.  Expected Discharge Plan: Long Term Nursing Home Barriers to Discharge: Continued Medical Work up   Patient Goals and CMS Choice            Expected Discharge Plan and Services In-house Referral: Clinical Social Work   Post Acute Care Choice: Nursing Home Living arrangements for the past 2 months: Skilled Nursing Facility                                      Prior Living Arrangements/Services Living arrangements for the past 2 months: Skilled Nursing Facility Lives with:: Facility Resident Patient language and need for interpreter reviewed:: Yes        Need for Family Participation in Patient Care: Yes (Comment) Care giver support system in place?: Yes (comment) Current home services: DME Criminal Activity/Legal Involvement Pertinent to Current Situation/Hospitalization: No - Comment as needed  Activities of Daily Living      Permission Sought/Granted Permission sought to share information with : Facility Medical Sales Representative, Family Supports Permission granted to share information with : No (Contact information on chart)  Share Information with NAME: [name]  Permission granted to share info w AGENCY: [facility]  Permission granted to share info w Relationship: Spouse  Permission granted to share info w Contact Information: [phone]  Emotional Assessment         Alcohol  / Substance Use: Not  Applicable Psych Involvement: No (comment)  Admission diagnosis:  DKA (diabetic ketoacidosis) (HCC) [E11.10] Healthcare-associated pneumonia [J18.9] Septic shock (HCC) [A41.9, R65.21] Diabetic ketoacidosis without coma associated with type 2 diabetes mellitus (HCC) [E11.10] Patient Active Problem List   Diagnosis Date Noted   DKA (diabetic ketoacidosis) (HCC) 12/24/2024   Malnutrition of moderate degree 12/06/2024   Hypernatremia 12/04/2024   Pressure injury of skin 10/14/2024   Sepsis (HCC) 10/13/2024   Acute respiratory failure with hypoxia (HCC) 01/24/2023   Sepsis due to undetermined organism (HCC) 12/28/2022   Hospital-acquired pneumonia 12/28/2022   Moderate protein-calorie malnutrition 09/12/2022   Urethral bleeding 09/11/2022   Hyperglycemia 09/11/2022   Acute encephalopathy 09/11/2022   Sacral decubitus ulcer 09/11/2022   CAD (coronary artery disease) 09/11/2022   Septic shock (HCC) 09/10/2022   Gross hematuria    Aspiration into airway    Chronic respiratory failure with hypoxia (HCC)    Acute on chronic respiratory failure with hypoxia (HCC) 06/21/2022   Seizure disorder (HCC) 06/21/2022   Pneumonia 06/21/2022   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 [place] [address] Phone: [phone] Fax: [phone]  [facility] [address] [place] [address] Phone: [phone] Fax:  [phone]     Social Drivers of Health (SDOH) Social History: SDOH Screenings   Food Insecurity: Patient Unable To Answer (10/15/2024)  Housing: Patient Unable To Answer (10/15/2024)  Transportation Needs: Patient Unable To Answer (10/15/2024)  Utilities: Patient Unable To Answer (10/15/2024)  Social Connections: Patient Unable To Answer (10/15/2024)  Tobacco Use: Medium Risk (12/24/2024)   SDOH Interventions:     Readmission Risk Interventions    10/16/2024    4:35 PM  Readmission Risk Prevention Plan  Transportation Screening Complete  PCP or Specialist Appt within 5-7 Days Complete  Home Care Screening Complete  Medication Review (RN CM) Complete

## 2024-12-25 NOTE — Progress Notes (Signed)
 Initial Nutrition Assessment  DOCUMENTATION CODES:  Non-severe (moderate) malnutrition in context of chronic illness  INTERVENTION:  Initiate tube feeding via g-tube: Glucerna 1.5 at 65 ml/h (1560 ml per day) Start at 25 and once able to advance, increase to 35 and continue to increase by 10mL q6h to goal Provides 2340 kcal, 129 gm protein, 1184 ml free water  daily Juven BID, each packet provides 95 calories, 2.5 grams of protein (collagen), and micronutrients to support wound healing  NUTRITION DIAGNOSIS:  Moderate Malnutrition related to chronic illness as evidenced by moderate fat depletion, moderate muscle depletion.  GOAL:  Patient will meet greater than or equal to 90% of their needs  MONITOR:  TF tolerance, Skin, Vent status, Labs  REASON FOR ASSESSMENT:  Consult Enteral/tube feeding initiation and management, Wound healing  ASSESSMENT:  Pt with hx of TBI (trach and g-tube dependent), CAD, DM type 2, HTN, and hypothyroidism presented to ED from [facility]. Workup in ED consistent with sepsis. Pt with extensive wounds presented on admission.   Patient is currently intubated on ventilator support. Pt not responsive on exam, keeps his eyes closed. RN providing care at the time of assessment.   Discussed in rounds, pt with extensive wounds present on exam. Mottled and still on pressor support at this time but rates slowly coming down. On an insulin  drip still, but MD ok with starting trickle feeds today. Discussed plan with RN.    Pt appears to be on Glucerna 1.5 at 14mL/h at [facility]  MV: 16.7 L/min Temp (24hrs), Avg:102.5 F (39.2 C), Min:99.1 F (37.3 C), Max:106.9 F (41.6 C) MAP (Art Line): 66-65mmHg  Admit weight: 82 kg   Current weight: 83.6 kg    Intake/Output Summary (Last 24 hours) at 12/25/2024 1245 Last data filed at 12/25/2024 1200 Gross per 24 hour  Intake 4586.41 ml  Output 150 ml  Net 4436.41 ml  Net IO Since Admission: 4,436.41 mL [12/25/24  1245]  Drains/Lines: CVC Triple Lumen, right femoral PEG-J LUQ UOP out x 24 hours Tracheostomy 6mm shiley  Nutritionally Relevant Medications: Continuous Infusions:  dextrose  5% lactated ringers  125 mL/hr at 12/25/24 1100   insulin  18 Units/hr (12/25/24 1120)   meropenem  (MERREM ) IV     norepinephrine  (LEVOPHED ) Adult infusion     potassium chloride  10 mEq (12/25/24 1121)   [START ON 12/27/2024] vancomycin      vasopressin  0.03 Units/min (12/25/24 1100)   PRN Meds: docusate sodium , polyethylene glycol  Labs Reviewed: Potassium 2.9 BUN 60, creatinine 1.3 Phosphorus 2.2 CBG ranges from 215->600 mg/dL over the last 24 hours HgbA1c 7.7% (10/13/24)  NUTRITION - FOCUSED PHYSICAL EXAM: Flowsheet Row Most Recent Value  Orbital Region Moderate depletion  Upper Arm Region No depletion  Thoracic and Lumbar Region No depletion  Buccal Region Mild depletion  Temple Region Severe depletion  Clavicle Bone Region Mild depletion  Clavicle and Acromion Bone Region Moderate depletion  Scapular Bone Region Moderate depletion  Dorsal Hand Moderate depletion  Patellar Region Severe depletion  Anterior Thigh Region Severe depletion  Posterior Calf Region Severe depletion  Edema (RD Assessment) None  Hair Reviewed  Eyes Reviewed  Mouth Reviewed  Skin Reviewed  Nails Reviewed    Diet Order:   Diet Order             Diet NPO time specified  Diet effective now                  EDUCATION NEEDS:  Not appropriate for education at this  time  Skin:  Skin Assessment: Skin Integrity Issues: Per WOC note 12/29: Wound type: L trochanter; Unstageable Pressure Injury Left ischium: Stage 3 Pressure Injury L heel; Unstageable Pressure injury Sacral; Stage 4 pressure injury R trochanter: Deep Tissue Pressure Injury Right ischium: Deep Tissue Pressure Injury  Last BM:  12/29 - type 7  Height:  Ht Readings from Last 1 Encounters:  12/25/24 6' (1.829 m)    Weight:  Wt  Readings from Last 1 Encounters:  12/25/24 83.6 kg    Ideal Body Weight:  80.9 kg  BMI:  Body mass index is 25 kg/m.  Estimated Nutritional Needs:  Kcal:  2200-2500 kcal/d Protein:  120-135g/d Fluid:  >2.2L/d    Joshua Haley, RD, LDN, CNSC Registered Dietitian II Please reach out via secure chat

## 2024-12-25 NOTE — ED Notes (Signed)
 Date and time results received: 12/25/2024 0259 (use smartphrase .now to insert current time)  Test: lactic acid Critical Value: > 9.0  Name of Provider Notified: n. Maree, md  Orders Received? Or Actions Taken?: n/a

## 2024-12-25 NOTE — Progress Notes (Signed)
 "  NAME:  Joshua Haley, MRN:  [id number], DOB:  November 23, 1951, LOS: 1 ADMISSION DATE:  12/24/2024, CONSULTATION DATE:  12/24/2024 REFERRING MD:  Andrea Ness, MD , CHIEF COMPLAINT:  Sepsis  History of Present Illness:  73 y/o male with PMH for TBI, Aphasia, Trach dependent on 1 liter O2, Hypothyroidism, multiple decubiti, Anemia,  s/p G tube, CAD, DMT2, chronic hypotension, ESBL infections, Pseudomonas infection who presents with concerns for Sepsis.  He was admitted 12/8 with Sepsis as well and d/c on 12/12.  He was treated for severe sepsis, Pneumonia and UTI and he was treated with Meropenem . He now presents again with fevers, hypotension requiring 35 mcg Levophed  and Vasopressin .  Patient found perspiring and mottled on peripheral Levophed  and Vasopressin .  Also BG >700.  NA 154, Cr 1.70, LA 11.9, WBC 11.7, HgB 11.9, Plt 512, BHA 1.14.  Pertinent  Medical History  BI, Aphasia, Trach dependent on 1 liter O2, Hypothyroidism, multiple decubiti, Anemia,  s/p G tube, CAD, DMT2, chronic hypotension, ESBL infections, Pseudomonas infection   Significant Hospital Events: Including procedures, antibiotic start and stop dates in addition to other pertinent events   12/28: admit to ICU  Interim History / Subjective:  Unresponsive  Objective    Blood pressure 106/60, pulse (!) 111, temperature (!) 102.9 F (39.4 C), temperature source Oral, resp. rate (!) 30, height 6' (1.829 m), weight 82 kg, SpO2 97%.    Vent Mode: PCV FiO2 (%):  [50 %-80 %] 60 % Set Rate:  [20 bmp] 20 bmp PEEP:  [8 cmH20] 8 cmH20 Pressure Support:  [20 cmH20] 20 cmH20 Plateau Pressure:  [24 cmH20-26 cmH20] 24 cmH20  No intake or output data in the 24 hours ending 12/25/24 0810 Filed Weights   12/24/24 1944  Weight: 82 kg    Examination: General: Chronically ill-appearing HENT: Pupils equal and reactive Lungs: Coarse breath sounds bilaterally Cardiovascular: S1-S2 appreciated Abdomen: Soft, bowel sounds  appreciated Extremities: No edema, mottled lower extremities Neuro: Unresponsive  I reviewed last 24 h vitals and pain scores, last 48 h intake and output, last 24 h labs and trends, and last 24 h imaging results.   Resolved problem list   Assessment and Plan   Septic shock - Continue vasopressin  and Levophed  - Continue current antibiotic therapy - Follow-up on cultures  Multiple wounds - Consult wound  Acute on chronic respiratory failure - Continue vent support - Continue mechanical ventilation -Target TVol 6-8cc/kgIBW -Target Plateau Pressure < 30cm H20 -Target driving pressure less than 15 cm of water  -Target PaO2 55-65: titrate PEEP/FiO2 per protocol -Ventilator associated pneumonia prevention protocol  Healthcare associated pneumonia - Continue antibiotics - Follow tracheal cultures  Uncontrolled diabetes -Plan will be to try and transition of insulin  today -Was not significantly acidemic with lowest bicarb of 18 -Anion gap is elevated at 28 -There was concern for DKA  Lactic acidosis - Continue to trend - Fluid resuscitate  Acute kidney injury - Continue to trend - Avoid nephrotoxic medications - Maintain renal perfusion - Avoid nephrotoxic medications  Hypothyroidism  The patient is critically ill with multiple organ systems failure and requires high complexity decision making for assessment and support, frequent evaluation and titration of therapies, application of advanced monitoring technologies and extensive interpretation of multiple databases. Critical Care Time devoted to patient care services described in this note independent of APP/resident time (if applicable)  is 30 minutes.   Jennet Epley MD [facility] Personal pager: See Amion If unanswered, please page  CCM On-call: #[phone]    "

## 2024-12-25 NOTE — Inpatient Diabetes Management (Addendum)
 Inpatient Diabetes Program Recommendations  AACE/ADA: New Consensus Statement on Inpatient Glycemic Control (2015)  Target Ranges:  Prepandial:   less than 140 mg/dL      Peak postprandial:   less than 180 mg/dL (1-2 hours)      Critically ill patients:  140 - 180 mg/dL   Lab Results  Component Value Date   GLUCAP 215 (H) 12/25/2024   HGBA1C 7.7 (H) 10/13/2024    Review of Glycemic Control  Latest Reference Range & Units 12/25/24 06:48 12/25/24 08:01 12/25/24 09:06 12/25/24 10:04  Glucose-Capillary 70 - 99 mg/dL 701 (H) 703 (H) 768 (H) 215 (H)   Diabetes history: DM 2 Outpatient Diabetes medications:  Fiasp  0-15 units q 6 hours Semglee  25 units q HS Current orders for Inpatient glycemic control:  IV insulin - Glucerna 1.5 cal-60 ml/hr Inpatient Diabetes Program Recommendations:    Note IV insulin  drip rates extremely high overnight.  Recommend continuing insulin  drip until drip rates are less than 8 units/hr (consider using ICU glycemic control order set Phase 3 for transition).   Thanks,  Randall Bullocks, RN, BC-ADM Inpatient Diabetes Coordinator Pager [phone]  (8a-5p)

## 2024-12-25 NOTE — Progress Notes (Signed)
 PHARMACY NOTE:  ANTIMICROBIAL RENAL DOSAGE ADJUSTMENT  Current antimicrobial regimen includes a mismatch between antimicrobial dosage and estimated renal function.  As per policy approved by the Pharmacy & Therapeutics and Medical Executive Committees, the antimicrobial dosage will be adjusted accordingly.  Current antimicrobial dosage:  Meropenem  1 g q12h   Indication: ESBL Infection   Renal Function:  Estimated Creatinine Clearance: 56.4 mL/min (A) (by C-G formula based on SCr of 1.3 mg/dL (H)). []      On intermittent HD, scheduled: []      On CRRT    Antimicrobial dosage has been changed to:  Meropenem  1 g q8h   Additional comments: creatinine improving    Thank you for allowing pharmacy to be a part of this patient's care.  [name], [facility] 12/25/2024 11:19 AM

## 2024-12-25 NOTE — Progress Notes (Signed)
 RT assisted with patient transport on vent from ED to CT and 2M12 without complications.

## 2024-12-25 NOTE — Progress Notes (Signed)
 Pharmacy Antibiotic Note  Joshua Haley is a 73 y.o. male admitted from [facility] on 12/24/2024 with fevers/hypotension/sepsis.  Pharmacy has been consulted for Vancomycin  and meropenem  dosing.  Plan: Vancomycin  1250 mg IV q48h Meropenem  1g IV 12h F/U renal fxn  Height: 6' (182.9 cm) Weight: 82 kg (180 lb 12.4 oz) IBW/kg (Calculated) : 77.6  Temp (24hrs), Avg:105 F (40.6 C), Min:103.1 F (39.5 C), Max:106.9 F (41.6 C)  Recent Labs  Lab 12/24/24 2021 12/24/24 2023 12/24/24 2030 12/24/24 2037 12/24/24 2038 12/24/24 2306 12/24/24 2312  WBC  --   --  11.7*  --   --   --   --   CREATININE  --  1.80*  --  1.70*  --  1.73*  --   LATICACIDVEN >15.0*  --   --   --  11.9*  --  10.2*    Estimated Creatinine Clearance: 42.4 mL/min (A) (by C-G formula based on SCr of 1.73 mg/dL (H)).    Allergies[1]  [name] 12/25/2024 12:31 AM     [1]  Allergies Allergen Reactions   Morphine And Codeine Other (See Comments)    Not specified on MAR    Penicillins Other (See Comments)    Has tolerated Cefepime  and Ceftriaxone    Cefepime      Unknown reaction per [facility]   Statins Nausea And Vomiting

## 2024-12-26 ENCOUNTER — Encounter (HOSPITAL_COMMUNITY): Payer: Self-pay

## 2024-12-26 LAB — BLOOD CULTURE ID PANEL (REFLEXED) - BCID2

## 2024-12-26 LAB — BASIC METABOLIC PANEL WITH GFR
Anion gap: 13 (ref 5–15)
BUN: 55 mg/dL — ABNORMAL HIGH (ref 8–23)
CO2: 22 mmol/L (ref 22–32)
Calcium: 9 mg/dL (ref 8.9–10.3)
Chloride: 110 mmol/L (ref 98–111)
Creatinine, Ser: 0.95 mg/dL (ref 0.61–1.24)
GFR, Estimated: >60 mL/min
Glucose, Bld: 223 mg/dL — ABNORMAL HIGH (ref 70–99)
Potassium: 3.9 mmol/L (ref 3.5–5.1)
Sodium: 144 mmol/L (ref 135–145)

## 2024-12-26 LAB — GLUCOSE, CAPILLARY
Glucose-Capillary: 200 mg/dL — ABNORMAL HIGH (ref 70–99)
Glucose-Capillary: 229 mg/dL — ABNORMAL HIGH (ref 70–99)
Glucose-Capillary: 223 mg/dL — ABNORMAL HIGH (ref 70–99)
Glucose-Capillary: 236 mg/dL — ABNORMAL HIGH (ref 70–99)
Glucose-Capillary: 187 mg/dL — ABNORMAL HIGH (ref 70–99)
Glucose-Capillary: 169 mg/dL — ABNORMAL HIGH (ref 70–99)

## 2024-12-26 LAB — PHOSPHORUS: Phosphorus: 2 mg/dL — ABNORMAL LOW (ref 2.5–4.6)

## 2024-12-26 LAB — LACTIC ACID, PLASMA: Lactic Acid, Venous: 2.2 mmol/L (ref 0.5–1.9)

## 2024-12-26 MED ORDER — INSULIN GLARGINE 100 UNIT/ML ~~LOC~~ SOLN
25.0000 [IU] | SUBCUTANEOUS | Status: DC
  Administered 2024-12-26: 25 [IU] via SUBCUTANEOUS
  Filled 2024-12-26 (×2): qty 0.25

## 2024-12-26 MED ORDER — SODIUM PHOSPHATES 45 MMOLE/15ML IV SOLN
15.0000 mmol | Freq: Once | INTRAVENOUS | Status: AC
  Administered 2024-12-26: 15 mmol via INTRAVENOUS
  Filled 2024-12-26: qty 5

## 2024-12-26 MED ORDER — DOCUSATE SODIUM 50 MG/5ML PO LIQD
100.0000 mg | Freq: Two times a day (BID) | ORAL | Status: DC | PRN
  Administered 2024-12-26: 100 mg

## 2024-12-26 MED ORDER — LEVETIRACETAM 500 MG PO TABS
500.0000 mg | ORAL_TABLET | Freq: Two times a day (BID) | ORAL | Status: DC
  Administered 2024-12-26 – 2025-01-02 (×15): 500 mg
  Filled 2024-12-26 (×16): qty 1

## 2024-12-26 MED ORDER — VANCOMYCIN HCL IN DEXTROSE 1-5 GM/200ML-% IV SOLN
1000.0000 mg | Freq: Two times a day (BID) | INTRAVENOUS | Status: DC
  Administered 2024-12-26 – 2024-12-29 (×7): 1000 mg via INTRAVENOUS
  Filled 2024-12-26 (×7): qty 200

## 2024-12-26 MED ORDER — POLYETHYLENE GLYCOL 3350 17 G PO PACK
17.0000 g | PACK | Freq: Every day | ORAL | Status: DC | PRN
  Administered 2024-12-26: 17 g

## 2024-12-26 NOTE — Progress Notes (Signed)
 PHARMACY - PHYSICIAN COMMUNICATION CRITICAL VALUE ALERT - BLOOD CULTURE IDENTIFICATION (BCID)  Joshua Haley is an 73 y.o. male who presented to [facility] on 12/24/2024   Name of physician (or Provider) Contacted: Dr. Jude  Current antibiotics: Vancomycin , Merrem   Changes to prescribed antibiotics recommended:  No changes  Results for orders placed or performed during the hospital encounter of 12/24/24  Blood Culture ID Panel (Reflexed) (Collected: 12/24/2024  7:55 PM)  Result Value Ref Range   Enterococcus faecalis NOT DETECTED NOT DETECTED   Enterococcus Faecium NOT DETECTED NOT DETECTED   Listeria monocytogenes NOT DETECTED NOT DETECTED   Staphylococcus species NOT DETECTED NOT DETECTED   Staphylococcus aureus (BCID) NOT DETECTED NOT DETECTED   Staphylococcus epidermidis NOT DETECTED NOT DETECTED   Staphylococcus lugdunensis NOT DETECTED NOT DETECTED   Streptococcus species NOT DETECTED NOT DETECTED   Streptococcus agalactiae NOT DETECTED NOT DETECTED   Streptococcus pneumoniae NOT DETECTED NOT DETECTED   Streptococcus pyogenes NOT DETECTED NOT DETECTED   A.calcoaceticus-baumannii NOT DETECTED NOT DETECTED   Bacteroides fragilis NOT DETECTED NOT DETECTED   Enterobacterales DETECTED (A) NOT DETECTED   Enterobacter cloacae complex NOT DETECTED NOT DETECTED   Escherichia coli NOT DETECTED NOT DETECTED   Klebsiella aerogenes NOT DETECTED NOT DETECTED   Klebsiella oxytoca NOT DETECTED NOT DETECTED   Klebsiella pneumoniae NOT DETECTED NOT DETECTED   Proteus species DETECTED (A) NOT DETECTED   Salmonella species NOT DETECTED NOT DETECTED   Serratia marcescens NOT DETECTED NOT DETECTED   Haemophilus influenzae NOT DETECTED NOT DETECTED   Neisseria meningitidis NOT DETECTED NOT DETECTED   Pseudomonas aeruginosa NOT DETECTED NOT DETECTED   Stenotrophomonas maltophilia NOT DETECTED NOT DETECTED   Candida albicans NOT DETECTED NOT DETECTED   Candida auris NOT DETECTED NOT DETECTED    Candida glabrata NOT DETECTED NOT DETECTED   Candida krusei NOT DETECTED NOT DETECTED   Candida parapsilosis NOT DETECTED NOT DETECTED   Candida tropicalis NOT DETECTED NOT DETECTED   Cryptococcus neoformans/gattii NOT DETECTED NOT DETECTED   CTX-M ESBL NOT DETECTED NOT DETECTED   Carbapenem resistance IMP NOT DETECTED NOT DETECTED   Carbapenem resistance KPC NOT DETECTED NOT DETECTED   Carbapenem resistance NDM NOT DETECTED NOT DETECTED   Carbapenem resist OXA 48 LIKE NOT DETECTED NOT DETECTED   Carbapenem resistance VIM NOT DETECTED NOT DETECTED    Augustin, Bun 12/26/2024  5:54 AM
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
## 2024-12-26 NOTE — Inpatient Diabetes Management (Signed)
 Inpatient Diabetes Program Recommendations  AACE/ADA: New Consensus Statement on Inpatient Glycemic Control (2015)  Target Ranges:  Prepandial:   less than 140 mg/dL      Peak postprandial:   less than 180 mg/dL (1-2 hours)      Critically ill patients:  140 - 180 mg/dL   Lab Results  Component Value Date   GLUCAP 229 (H) 12/26/2024   HGBA1C 7.7 (H) 10/13/2024    Review of Glycemic Control  Latest Reference Range & Units 12/25/24 20:55 12/25/24 23:07 12/26/24 03:56 12/26/24 07:36  Glucose-Capillary 70 - 99 mg/dL 861 (H) 836 (H) 799 (H) 229 (H)  (H): Data is abnormally high  Diabetes history: DM2 Outpatient Diabetes medications:  Semglee  25 units QD Fiasp  0-15 units Q6H Current orders for Inpatient glycemic control:  Lantus  8 units every day Novolog  0-20 units Q4H Glucerna @ 25 ml/hr with a goal of 65/hr  Inpatient Diabetes Program Recommendations:    Transitioned off of IV insulin  last evening to SQ.  Might consider adding Novolog  tube feed coverage Q4H as rate increases with a goal of 65 ml/hr.    Thank you, Wyvonna Pinal, MSN, CDCES Diabetes Coordinator Inpatient Diabetes Program [phone] (team pager from 8a-5p)

## 2024-12-26 NOTE — [place] FL2 (Signed)
 " [place]  MEDICAID FL2 LEVEL OF CARE FORM     IDENTIFICATION  Patient Name: Joshua Haley Birthdate: 09-06-1951 Sex: male Admission Date (Current Location): 12/24/2024  [facility] and [place] Number:  Producer, Television/film/video and Address:  [facility] [place], [address] [place], [place] [address]      Provider Number: [id number]  Attending Physician Name and Address:  Neda Jennet LABOR, MD  Relative Name and Phone Number:  [facility]; Spouse; [phone]    Current Level of Care: Hospital Recommended Level of Care: Skilled Nursing Facility Prior Approval Number:    Date Approved/Denied:   PASRR Number: [id number]  Discharge Plan: SNF    Current Diagnoses: Patient Active Problem List   Diagnosis Date Noted   DKA (diabetic ketoacidosis) (HCC) 12/24/2024   Malnutrition of moderate degree 12/06/2024   Hypernatremia 12/04/2024   Pressure injury of skin 10/14/2024   Sepsis (HCC) 10/13/2024   Acute respiratory failure with hypoxia (HCC) 01/24/2023   Sepsis due to undetermined organism (HCC) 12/28/2022   Hospital-acquired pneumonia 12/28/2022   Moderate protein-calorie malnutrition 09/12/2022   Urethral bleeding 09/11/2022   Hyperglycemia 09/11/2022   Acute encephalopathy 09/11/2022   Sacral decubitus ulcer 09/11/2022   CAD (coronary artery disease) 09/11/2022   Septic shock (HCC) 09/10/2022   Gross hematuria    Aspiration into airway    Chronic respiratory failure with hypoxia (HCC)    Acute on chronic respiratory failure with hypoxia (HCC) 06/21/2022   Seizure disorder (HCC) 06/21/2022   Pneumonia 06/21/2022   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, sequela 06/21/2022   Tracheostomy status (HCC) 06/21/2022   T2DM (type 2 diabetes mellitus) (HCC) 01/20/2020   Nephrolithiasis 01/20/2020   Hypothyroidism 01/20/2020   GERD (gastroesophageal reflux disease) 01/20/2020   HTN (hypertension) 01/20/2020   Chest pain  01/20/2020    Orientation RESPIRATION BLADDER Height & Weight        Tracheostomy, O2, Vent (Trach Vent 1L oxygen FiO2 40%) Incontinent, Indwelling catheter Weight: 184 lb 4.9 oz (83.6 kg) Height:  6' (182.9 cm)  BEHAVIORAL SYMPTOMS/MOOD NEUROLOGICAL BOWEL NUTRITION STATUS    Convulsions/Seizures (History of seizures) Incontinent (Fecal Management System 45mL) Diet (Please see discharge summary. 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)    SPECIAL CARE FACTORS FREQUENCY  Speech therapy             Speech Therapy Frequency: TBD but likely 3x per week      Contractures Contractures Info: Not present    Additional Factors Info  Code Status, Allergies, Insulin  Sliding Scale, Isolation Precautions Code Status Info: DNR-LIMITED -Do Not Intubate/DNI Allergies Info: Morphine And Codeine, Penicillins, Cefepime , Statins   Insulin  Sliding Scale Info: Please see discharge summary Isolation Precautions Info: Contact Precautions (ESBL, MRSA)     Current Medications (12/26/2024):  This is the current hospital active medication list Current Facility-Administered Medications  Medication Dose Route Frequency Provider Last Rate Last Admin   acetaminophen  (TYLENOL ) tablet 650 mg  650  mg Per Tube Q6H PRN Neda, Adewale A, MD   650 mg at 12/26/24 0407   Chlorhexidine  Gluconate Cloth 2 % PADS 6 each  6 each Topical Daily Gleason, Leita SAUNDERS, PA-C   6 each at 12/25/24 2200   dextrose  50 % solution 0-50 mL  0-50 mL Intravenous PRN Lenor Hollering, MD       docusate (COLACE) 50 MG/5ML liquid 100 mg  100 mg Per Tube BID PRN Neda, Adewale A, MD   100 mg at 12/26/24 0407   feeding  supplement (GLUCERNA 1.5 CAL) liquid 1,000 mL  1,000 mL Per Tube Continuous Olalere, Adewale A, MD 25 mL/hr at 12/26/24 0800 Infusion Verify at 12/26/24 0800   heparin  injection 5,000 Units  5,000 Units Subcutaneous Q8H Gleason, Laura R, PA-C   5,000 Units at 12/26/24 9381   insulin  aspart (novoLOG ) injection 0-20 Units  0-20 Units Subcutaneous Q4H Olalere, Adewale A, MD   7 Units at 12/26/24 0746   insulin  glargine (LANTUS ) injection 25 Units  25 Units Subcutaneous Q24H Olalere, Adewale A, MD       insulin  regular, human (MYXREDLIN ) 100 units/ 100 mL infusion   Intravenous Continuous Gleason, Leita SAUNDERS, PA-C   Stopped at 12/25/24 2153   levETIRAcetam  (KEPPRA ) tablet 500 mg  500 mg Per Tube BID Olalere, Adewale A, MD   500 mg at 12/26/24 1141   levothyroxine  (SYNTHROID ) tablet 100 mcg  100 mcg Per Tube Q0600 Olalere, Adewale A, MD   100 mcg at 12/26/24 0618   meropenem  (MERREM ) 1 g in sodium chloride  0.9 % 100 mL IVPB  1 g Intravenous Q8H Olalere, Adewale A, MD   Stopped at 12/26/24 9351   midodrine  (PROAMATINE ) tablet 10 mg  10 mg Per Tube TID WC Olalere, Adewale A, MD   10 mg at 12/26/24 1141   multivitamin with minerals tablet 1 tablet  1 tablet Per Tube Daily Olalere, Adewale A, MD   1 tablet at 12/26/24 1027   mupirocin  ointment (BACTROBAN ) 2 % 1 Application  1 Application Nasal BID Olalere, Adewale A, MD   1 Application at 12/26/24 1027   norepinephrine  (LEVOPHED ) 16 mg in 250mL (0.064 mg/mL) premix infusion  0-40 mcg/min Intravenous Continuous Olalere, Adewale A, MD 5.63 mL/hr at 12/26/24 0800 6 mcg/min at 12/26/24 0800   nutrition supplement (JUVEN) (JUVEN) powder packet 1 packet  1 packet Per Tube BID BM Olalere, Adewale A, MD   1 packet at 12/26/24 1027   Oral care mouth rinse  15 mL Mouth Rinse Q2H Olalere, Adewale A, MD   15 mL at 12/26/24 1200   Oral care mouth rinse  15 mL Mouth Rinse PRN Olalere, Adewale A, MD       polyethylene glycol (MIRALAX  / GLYCOLAX ) packet 17 g  17 g Per Tube Daily  PRN Olalere, Adewale A, MD   17 g at 12/26/24 0408   sodium hypochlorite (DAKIN'S 1/4 STRENGTH) topical solution   Irrigation Q0200 Olalere, Adewale A, MD   Given at 12/26/24 0134   sodium phosphate  15 mmol in sodium chloride  0.9 % 250 mL infusion  15 mmol Intravenous Once Olalere, Adewale A, MD 43 mL/hr at 12/26/24 1031 15 mmol at 12/26/24 1031   vancomycin  (VANCOREADY) IVPB 1500 mg/300 mL  1,500 mg Intravenous Q24H Olalere, Adewale A, MD   Stopped at 12/26/24 0035   vasopressin  (PITRESSIN) 20 Units in 100 mL (0.2 unit/mL) infusion-*FOR SHOCK*  0-0.03 Units/min Intravenous Continuous Lenor Hollering, MD 9 mL/hr at 12/26/24 0800  0.03 Units/min at 12/26/24 0800     Discharge Medications: Please see discharge summary for a list of discharge medications.  Relevant Imaging Results:  Relevant Lab Results:   Additional Information SSN: [id number]  Taeler Winning E Zalika Tieszen, LCSWA     "

## 2024-12-26 NOTE — Progress Notes (Signed)
 Pharmacy Electrolyte Replacement  Recent Labs:  Recent Labs    12/25/24 0457 12/25/24 0902 12/26/24 0358  K 3.3*   < > 3.9  MG 1.8  --   --   PHOS 2.2*  --  2.0*  CREATININE 1.46*   < > 0.95   < > = values in this interval not displayed.    Low Critical Values (K </= 2.5, Phos </= 1, Mg </= 1) Present: None  MD Contacted: n/a   Plan: 15 mmol Na Phos IV per protocol   [name], PharmD, BCPS, BCCCP Clinical Pharmacist

## 2024-12-26 NOTE — Progress Notes (Signed)
 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  He was admitted 12/8 with Sepsis as well and d/c on 12/12.  He was treated for severe sepsis, Pneumonia and UTI and he was treated with Meropenem . He now presents again with fevers, hypotension requiring 35 mcg Levophed  and Vasopressin .  Patient found perspiring and mottled on peripheral Levophed  and Vasopressin .  Also BG >700.  NA 154, Cr 1.70, LA 11.9, WBC 11.7, HgB 11.9, Plt 512, BHA 1.14.  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 rate (!) 26, height 6' (1.829 m), weight 83.6 kg, SpO2 100%.    Vent Mode: PCV FiO2 (%):  [40 %] 40 % Set Rate:  [20 bmp] 20 bmp PEEP:  [8 cmH20] 8 cmH20 Plateau Pressure:  [21 cmH20-23 cmH20] 21 cmH20   Intake/Output Summary (Last 24 hours) at 12/26/2024 1144 Last data filed at 12/26/2024 0800 Gross per 24 hour  Intake 2542.76 ml  Output 1320 ml  Net 1222.76 ml   Filed Weights   12/24/24 1944 12/25/24 0800  Weight: 82 kg 83.6 kg     Examination: General: Chronically ill-appearing HENT: Pupils equal and reactive Lungs: Coarse breath sounds bilaterally Cardiovascular: S1-S2 appreciated Abdomen: Soft, bowel sounds appreciated Extremities: No edema, mottled lower extremities Neuro: Unresponsive  Lab data reviewed - Blood cultures positive for Proteus   Resolved problem list   Assessment and Plan   Septic shock - Continue vasopressin  and Levophed  - Continue antibiotic therapy - Continue to follow cultures and sensitivity  Multiple wounds - Continue wound management  Acute on chronic respiratory failure - Continue vent support - Continue mechanical ventilation -Target TVol 6-8cc/kgIBW -Target Plateau Pressure < 30cm H20 -Target driving pressure less than 15 cm of water  -Target PaO2 55-65: titrate PEEP/FiO2 per protocol -Ventilator associated pneumonia prevention protocol  Healthcare associated pneumonia - Continue antibiotics - Will continue to follow tracheal cultures  Uncontrolled diabetes - Was transitioned to subcu insulin  - On Lantus  25 units - SSI coverage  Lactic acidosis - Trended We will  Acute kidney injury - Avoid nephrotoxic medication - Maintain renal perfusion - Will continue to trend  Hypothyroidism - Continue Synthroid   The patient is critically ill with multiple organ systems failure and requires high complexity decision making for assessment and support, frequent evaluation and titration of therapies, application of advanced monitoring technologies and extensive interpretation of multiple databases. Critical Care Time devoted to patient care services described in this note independent of APP/resident time (if applicable)  is 35 minutes.   Jennet Epley MD [facility] Personal pager: See Amion If unanswered, please page  CCM On-call: #[phone] "

## 2024-12-26 NOTE — Progress Notes (Signed)
 Pharmacy Antibiotic Note  Joshua Haley is a 73 y.o. male admitted from [facility] on 12/24/2024 with fevers/hypotension/sepsis.  Pharmacy has been consulted for Vancomycin  and meropenem  dosing.  Scr dramatically improved with fluids - now 0.95. Dosing to change as follows.   Plan: Vancomycin  1000 mg q12h for AUC ~480  Meropenem  1g IV 8h Will continue to follow   Height: 6' (182.9 cm) Weight: 83.6 kg (184 lb 4.9 oz) IBW/kg (Calculated) : 77.6  Temp (24hrs), Avg:99.4 F (37.4 C), Min:97.8 F (36.6 C), Max:100.8 F (38.2 C)  Recent Labs  Lab 12/24/24 2030 12/24/24 2037 12/24/24 2038 12/24/24 2306 12/24/24 2312 12/25/24 0200 12/25/24 0206 12/25/24 0457 12/25/24 0500 12/25/24 0902 12/25/24 1228 12/26/24 0358  WBC 11.7*  --   --   --   --   --  11.8* 13.9*  --   --   --   --   CREATININE  --    < >  --    < >  --   --  1.53* 1.46*  --  1.30* 1.21 0.95  LATICACIDVEN  --   --  11.9*  --  10.2* >9.0*  --   --  8.5*  --   --  2.2*   < > = values in this interval not displayed.    Estimated Creatinine Clearance: 77.1 mL/min (by C-G formula based on SCr of 0.95 mg/dL).    Allergies[1]  Joshua Haley 12/26/2024 11:57 AM       [1]  Allergies Allergen Reactions   Morphine And Codeine Other (See Comments)    Not specified on MAR    Penicillins Other (See Comments)    Has tolerated Cefepime  and Ceftriaxone    Cefepime  Other (See Comments)    Unknown reaction per [facility]   Statins Nausea And Vomiting

## 2024-12-26 NOTE — TOC Progression Note (Signed)
 Transition of Care [facility]) - Progression Note    Patient Details  Name: Joshua Haley MRN: [id number] Date of Birth: [birth year]  [facility] CM/SW Contact  Lauraine FORBES Saa, LCSWA Phone Number: 12/26/2024, 11:44 AM  Clinical Narrative:     11:44 AM Per progressions, patient is unable to participate with PT/OT. SLP consult was placed as patient worked with them earlier this month and had recommendations. CSW sent patient's FL2 to [facility]. CSW will continue to follow.  Expected Discharge Plan: Long Term Nursing Home Barriers to Discharge: Continued Medical Work up               Expected Discharge Plan and Services In-house Referral: Clinical Social Work   Post Acute Care Choice: Nursing Home Living arrangements for the past 2 months: Skilled Nursing Facility                                       Social Drivers of Health (SDOH) Interventions SDOH Screenings   Food Insecurity: Patient Unable To Answer (10/15/2024)  Housing: Patient Unable To Answer (10/15/2024)  Transportation Needs: Patient Unable To Answer (10/15/2024)  Utilities: Patient Unable To Answer (10/15/2024)  Social Connections: Patient Unable To Answer (10/15/2024)  Tobacco Use: Medium Risk (12/24/2024)    Readmission Risk Interventions    10/16/2024    4:35 PM  Readmission Risk Prevention Plan  Transportation Screening Complete  PCP or Specialist Appt within 5-7 Days Complete  Home Care Screening Complete  Medication Review (RN CM) Complete

## 2024-12-27 DIAGNOSIS — Z87820 Personal history of traumatic brain injury: Secondary | ICD-10-CM

## 2024-12-27 DIAGNOSIS — J9621 Acute and chronic respiratory failure with hypoxia: Secondary | ICD-10-CM

## 2024-12-27 LAB — CBC
HCT: 26.9 % — ABNORMAL LOW (ref 39.0–52.0)
Hemoglobin: 8.2 g/dL — ABNORMAL LOW (ref 13.0–17.0)
MCH: 27.6 pg (ref 26.0–34.0)
MCHC: 30.5 g/dL (ref 30.0–36.0)
MCV: 90.6 fL (ref 80.0–100.0)
Platelets: 223 K/uL (ref 150–400)
RBC: 2.97 MIL/uL — ABNORMAL LOW (ref 4.22–5.81)
RDW: 18.6 % — ABNORMAL HIGH (ref 11.5–15.5)
WBC: 7.8 K/uL (ref 4.0–10.5)
nRBC: 0 % (ref 0.0–0.2)

## 2024-12-27 LAB — BASIC METABOLIC PANEL WITH GFR
Anion gap: 11 (ref 5–15)
BUN: 53 mg/dL — ABNORMAL HIGH (ref 8–23)
CO2: 24 mmol/L (ref 22–32)
Calcium: 9.4 mg/dL (ref 8.9–10.3)
Chloride: 113 mmol/L — ABNORMAL HIGH (ref 98–111)
Creatinine, Ser: 0.73 mg/dL (ref 0.61–1.24)
GFR, Estimated: >60 mL/min
Glucose, Bld: 232 mg/dL — ABNORMAL HIGH (ref 70–99)
Potassium: 3.2 mmol/L — ABNORMAL LOW (ref 3.5–5.1)
Sodium: 147 mmol/L — ABNORMAL HIGH (ref 135–145)

## 2024-12-27 LAB — GLUCOSE, CAPILLARY
Glucose-Capillary: 134 mg/dL — ABNORMAL HIGH (ref 70–99)
Glucose-Capillary: 150 mg/dL — ABNORMAL HIGH (ref 70–99)
Glucose-Capillary: 204 mg/dL — ABNORMAL HIGH (ref 70–99)
Glucose-Capillary: 204 mg/dL — ABNORMAL HIGH (ref 70–99)
Glucose-Capillary: 205 mg/dL — ABNORMAL HIGH (ref 70–99)
Glucose-Capillary: 221 mg/dL — ABNORMAL HIGH (ref 70–99)

## 2024-12-27 LAB — PHOSPHORUS: Phosphorus: 1.7 mg/dL — ABNORMAL LOW (ref 2.5–4.6)

## 2024-12-27 LAB — MAGNESIUM: Magnesium: 2.1 mg/dL (ref 1.7–2.4)

## 2024-12-27 MED ORDER — LIOTHYRONINE SODIUM 5 MCG PO TABS
7.5000 ug | ORAL_TABLET | Freq: Two times a day (BID) | ORAL | Status: DC
  Administered 2024-12-27 – 2025-01-02 (×13): 7.5 ug
  Filled 2024-12-27 (×14): qty 2

## 2024-12-27 MED ORDER — AMANTADINE HCL 50 MG/5ML PO SOLN
100.0000 mg | Freq: Every day | ORAL | Status: DC
  Administered 2024-12-27 – 2025-01-02 (×7): 100 mg
  Filled 2024-12-27 (×7): qty 10

## 2024-12-27 MED ORDER — GLUCERNA 1.5 CAL PO LIQD
1000.0000 mL | ORAL | Status: DC
  Administered 2024-12-28 – 2025-01-01 (×4): 1000 mL
  Filled 2024-12-27 (×12): qty 1000

## 2024-12-27 MED ORDER — FREE WATER
100.0000 mL | Status: DC
  Administered 2024-12-27 – 2024-12-28 (×6): 100 mL

## 2024-12-27 MED ORDER — INSULIN ASPART 100 UNIT/ML IJ SOLN
3.0000 [IU] | INTRAMUSCULAR | Status: DC
  Administered 2024-12-27 – 2025-01-02 (×34): 3 [IU] via SUBCUTANEOUS
  Filled 2024-12-27 (×33): qty 3

## 2024-12-27 MED ORDER — SODIUM PHOSPHATES 45 MMOLE/15ML IV SOLN
30.0000 mmol | Freq: Once | INTRAVENOUS | Status: AC
  Administered 2024-12-27: 30 mmol via INTRAVENOUS
  Filled 2024-12-27: qty 10

## 2024-12-27 MED ORDER — POTASSIUM CHLORIDE 20 MEQ PO PACK
40.0000 meq | PACK | ORAL | Status: AC
  Administered 2024-12-27 (×2): 40 meq
  Filled 2024-12-27 (×2): qty 2

## 2024-12-27 MED ORDER — SERTRALINE HCL 50 MG PO TABS
25.0000 mg | ORAL_TABLET | Freq: Every day | ORAL | Status: DC
  Administered 2024-12-27 – 2025-01-02 (×7): 25 mg
  Filled 2024-12-27 (×7): qty 1

## 2024-12-27 MED ORDER — INSULIN GLARGINE 100 UNIT/ML ~~LOC~~ SOLN
30.0000 [IU] | SUBCUTANEOUS | Status: DC
  Administered 2024-12-27: 30 [IU] via SUBCUTANEOUS
  Filled 2024-12-27 (×2): qty 0.3

## 2024-12-27 MED ORDER — MODAFINIL 100 MG PO TABS
100.0000 mg | ORAL_TABLET | Freq: Every day | ORAL | Status: DC
  Administered 2024-12-27 – 2025-01-02 (×7): 100 mg
  Filled 2024-12-27 (×7): qty 1

## 2024-12-27 NOTE — Progress Notes (Addendum)
 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  He was admitted 12/8 with Sepsis as well and d/c on 12/12.  He was treated for severe sepsis, Pneumonia and UTI and he was treated with Meropenem . He now presents again with fevers, hypotension requiring 35 mcg Levophed  and Vasopressin .  Patient found perspiring and mottled on peripheral Levophed  and Vasopressin .  Also BG >700.  NA 154, Cr 1.70, LA 11.9, WBC 11.7, HgB 11.9, Plt 512, BHA 1.14.  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 Remains minimally responsive.   Objective    Blood pressure 106/60, pulse (!) 101, temperature 99.2 F (37.3 C), temperature source Axillary, resp. rate 11, height 6' (1.829 m), weight 83.6 kg, SpO2 100%.    Vent Mode: PCV FiO2 (%):  [40 %] 40 % Set Rate:  [20 bmp] 20 bmp PEEP:  [8 cmH20] 8 cmH20 Plateau Pressure:  [21 cmH20-25 cmH20] 25 cmH20   Intake/Output Summary (Last 24 hours) at 12/27/2024 0920 Last data filed at 12/27/2024 0800 Gross per 24 hour  Intake 1973.32 ml  Output 1660 ml  Net 313.32 ml   Filed Weights   12/24/24 1944 12/25/24 0800  Weight: 82 kg 83.6 kg     Examination: General: acute on chronically ill appearing male  HENT: [place]/AT, sclera anicteric, trach in place Lungs: breathing comfortably on mechanical ventilation, CTAB Cardiovascular: sinus tachycardia, normal S1 and S2, no m/r/g  Abdomen: soft, non-tender, non-distended, PEG tube in place  Extremities: warm, dry, no edema Neuro: opens eyes spontaneously, withdraws in all four extremities L>R   Labs/imaging reviewed   Resolved problem list  AKI, resolved   Assessment and Plan   Septic shock, improving: unclear etiology has multiple sources of infection including wounds, chronic foley and possible HCAP. Weaned off levophed  12/31. Blood cultures 12/28 with proteus in aerobic bottle only. MRSA nares positive. Recently had urine cultures with MDR Klebsiella and E. Coli and has a history of prior Pseudomonas/ESBL positive cultures.   Lactic acidosis, improved  - Continue vancomycin  and meropenem  with plan for likely 7-10 day course - Continue home midodrine     Multiple wounds - Wound care per wound care nurse   Acute on chronic respiratory failure, in the setting of above, s/p trach following TBI (at baseline on 1L) - Continue vent support, will transition to pressure support and transition to TCT as appropriate  - Target TVol 6-8cc/kgIBW - Target Plateau Pressure < 30cm H20 - Target driving pressure less than 15 cm of water  - Target PaO2 55-65: titrate PEEP/FiO2 per protocol - Ventilator associated pneumonia prevention protocol - Continue tracheostomy care   History of TBI; reportedly non-verbal at baseline with left sided hemiparesis: currently does not follow commands on  right side but does withdraw briskly, suspect superimposed metabolic encephalopathy versus baseline  - Continue home Keppra   - Resume home modafinil , amantadine  and sertraline    Healthcare associated pneumonia - Continue antibiotics per above  Hypernatremia - Add free water  flushes   T2DM with  hyperglycemia; A1c 7.7 on 10/13/2024 - Increase glargine 30 units daily - Add tube feed coverage with insulin  aspart 3 units q4h  - Continue SSI   Hypothyroidism; TSH 1.9 - Continue Synthroid  and resume home liothyronine    Signature The patient is critically ill with multiple organ system failure and requires high complexity decision making for assessment and support, frequent evaluation and titration of therapies, advanced monitoring, review of radiographic studies and interpretation of complex data.   Critical Care Time devoted to patient care services, exclusive of separately billable procedures, described in this note is 35 minutes.  Rexene LOISE Blush, PA-C [facility] 12/27/2024 9:20 AM  Please see [email or web address] for pager details.  From 7A-7P if no response, please call [phone] After hours, please call ELink [phone]  "

## 2024-12-27 NOTE — TOC Progression Note (Signed)
 Transition of Care [facility]) - Progression Note    Patient Details  Name: Joshua Haley MRN: [id number] Date of Birth: 10-Jul-1951  Transition of Care [facility]) CM/SW Contact  Lauraine FORBES Saa, LCSWA Phone Number: 12/27/2024, 1:29 PM  Clinical Narrative:     1:29 PM Medical team informed TOC that patient is off pressors and able to discharge to [facility] for vent weaning support. TOC will continue to follow.  Expected Discharge Plan: Long Term Nursing Home Barriers to Discharge: Continued Medical Work up               Expected Discharge Plan and Services In-house Referral: Clinical Social Work   Post Acute Care Choice: Nursing Home Living arrangements for the past 2 months: Skilled Nursing Facility                                       Social Drivers of Health (SDOH) Interventions SDOH Screenings   Food Insecurity: Patient Unable To Answer (10/15/2024)  Housing: Patient Unable To Answer (10/15/2024)  Transportation Needs: Patient Unable To Answer (10/15/2024)  Utilities: Patient Unable To Answer (10/15/2024)  Social Connections: Patient Unable To Answer (10/15/2024)  Tobacco Use: Medium Risk (12/24/2024)    Readmission Risk Interventions    10/16/2024    4:35 PM  Readmission Risk Prevention Plan  Transportation Screening Complete  PCP or Specialist Appt within 5-7 Days Complete  Home Care Screening Complete  Medication Review (RN CM) Complete

## 2024-12-27 NOTE — Progress Notes (Signed)
" °   12/27/24 0945  Adult Ventilator Settings  Vent Type Servo i  Humidity HME  Vent Mode (S)  PSV;CPAP (per MD)  FiO2 (%) 40 %  Pressure Support (S)  10 cmH20  PEEP (S)  8 cmH20   1216: placed back on PCV due to increased work of breathing after episode of vomiting.  "

## 2024-12-27 NOTE — Plan of Care (Signed)
   Problem: Coping: Goal: Ability to adjust to condition or change in health will improve Outcome: Progressing   Problem: Fluid Volume: Goal: Ability to maintain a balanced intake and output will improve Outcome: Progressing   Problem: Health Behavior/Discharge Planning: Goal: Ability to identify and utilize available resources and services will improve Outcome: Progressing

## 2024-12-27 NOTE — Progress Notes (Signed)
" °  Progress Note   Date: 12/27/2024  Patient Name: Joshua Haley        MRN#: [id number]  Clarification of diagnosis:  Moderate malnutrition         "

## 2024-12-27 NOTE — Progress Notes (Signed)
 OT Cancellation Note  Patient Details Name: Joshua Haley MRN: [id number] DOB: 08-17-1951   Cancelled Treatment:    Reason Eval/Treat Not Completed: Other (comment) (RN reporting pt remains unresponsive, will continue efforts as albe/as appropriate.)   Herberto Ledwell M. [place], OTR/L [facility] Acute Rehabilitation Services [phone] Secure Chat Preferred  Ketih Goodie 12/27/2024, 11:10 AM

## 2024-12-27 NOTE — Progress Notes (Signed)
 [facility] ADULT ICU REPLACEMENT PROTOCOL   The patient does apply for the [facility] Adult ICU Electrolyte Replacment Protocol based on the criteria listed below:   1.Exclusion criteria: TCTS, ECMO, Dialysis, and Myasthenia Gravis patients 2. Is GFR >/= 30 ml/min? Yes.    Patient's GFR today is >60 3. Is SCr </= 2? Yes.   Patient's SCr is 0.73 mg/dL 4. Did SCr increase >/= 0.5 in 24 hours? No. 5.Pt's weight >40kg  Yes.   6. Abnormal electrolyte(s): Potassium  7. Electrolytes replaced per protocol 8.  Call MD STAT for K+ </= 2.5, Phos </= 1, or Mag </= 1 Physician:  Dr. Paliwal  Danyel Tobey A Bryar Dahms 12/27/2024 5:00 AM

## 2024-12-27 NOTE — Progress Notes (Signed)
 Pt vomit tube feed around 12:00 pm. TF stopped, MD notified, per MD respond hold TF for 4 to 6 hrs.    1810: MD respond: restart TF at 30ml, don't advance for now

## 2024-12-28 LAB — GLUCOSE, CAPILLARY
Glucose-Capillary: 110 mg/dL — ABNORMAL HIGH (ref 70–99)
Glucose-Capillary: 123 mg/dL — ABNORMAL HIGH (ref 70–99)
Glucose-Capillary: 131 mg/dL — ABNORMAL HIGH (ref 70–99)
Glucose-Capillary: 165 mg/dL — ABNORMAL HIGH (ref 70–99)
Glucose-Capillary: 142 mg/dL — ABNORMAL HIGH (ref 70–99)
Glucose-Capillary: 115 mg/dL — ABNORMAL HIGH (ref 70–99)

## 2024-12-28 LAB — PHOSPHORUS: Phosphorus: 2.3 mg/dL — ABNORMAL LOW (ref 2.5–4.6)

## 2024-12-28 LAB — BASIC METABOLIC PANEL WITH GFR
Anion gap: 11 (ref 5–15)
BUN: 47 mg/dL — ABNORMAL HIGH (ref 8–23)
CO2: 25 mmol/L (ref 22–32)
Calcium: 9.6 mg/dL (ref 8.9–10.3)
Chloride: 118 mmol/L — ABNORMAL HIGH (ref 98–111)
Creatinine, Ser: 0.61 mg/dL (ref 0.61–1.24)
GFR, Estimated: >60 mL/min
Glucose, Bld: 132 mg/dL — ABNORMAL HIGH (ref 70–99)
Potassium: 3.9 mmol/L (ref 3.5–5.1)
Sodium: 153 mmol/L — ABNORMAL HIGH (ref 135–145)

## 2024-12-28 LAB — CBC
HCT: 27.2 % — ABNORMAL LOW (ref 39.0–52.0)
Hemoglobin: 8.2 g/dL — ABNORMAL LOW (ref 13.0–17.0)
MCH: 27.3 pg (ref 26.0–34.0)
MCHC: 30.1 g/dL (ref 30.0–36.0)
MCV: 90.7 fL (ref 80.0–100.0)
Platelets: 231 K/uL (ref 150–400)
RBC: 3 MIL/uL — ABNORMAL LOW (ref 4.22–5.81)
RDW: 18.6 % — ABNORMAL HIGH (ref 11.5–15.5)
WBC: 6.8 K/uL (ref 4.0–10.5)
nRBC: 0.3 % — ABNORMAL HIGH (ref 0.0–0.2)

## 2024-12-28 LAB — MAGNESIUM: Magnesium: 2.1 mg/dL (ref 1.7–2.4)

## 2024-12-28 MED ORDER — FREE WATER
200.0000 mL | Status: DC
  Administered 2024-12-28 – 2025-01-02 (×29): 200 mL

## 2024-12-28 MED ORDER — INSULIN GLARGINE 100 UNIT/ML ~~LOC~~ SOLN
15.0000 [IU] | Freq: Every day | SUBCUTANEOUS | Status: DC
  Administered 2024-12-28 – 2024-12-30 (×3): 15 [IU] via SUBCUTANEOUS
  Filled 2024-12-28 (×4): qty 0.15

## 2024-12-28 NOTE — Progress Notes (Signed)
 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  He was admitted 12/8 with Sepsis as well and d/c on 12/12.  He was treated for severe sepsis, Pneumonia and UTI and he was treated with Meropenem . He now presents again with fevers, hypotension requiring 35 mcg Levophed  and Vasopressin .  Patient found perspiring and mottled on peripheral Levophed  and Vasopressin .  Also BG >700.  NA 154, Cr 1.70, LA 11.9, WBC 11.7, HgB 11.9, Plt 512, BHA 1.14.  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 rate (!) 24, height 6' (1.829 m), weight 81.5 kg, SpO2 95%.    Vent Mode: PCV FiO2 (%):  [35 %-40 %] 35 % Set Rate:  [20 bmp] 20 bmp PEEP:  [8 cmH20] 8 cmH20 Plateau Pressure:  [21 cmH20-27 cmH20] 21 cmH20   Intake/Output Summary (Last 24 hours) at 12/28/2024 1719 Last data filed at 12/28/2024 1712 Gross per 24 hour  Intake 2080.07 ml  Output 2165 ml  Net -84.93 ml   Filed Weights   12/24/24 1944 12/25/24 0800 12/28/24 0500   Weight: 82 kg 83.6 kg 81.5 kg    Examination: General: Chronically ill-appearing HENT: Pupils equal and reactive Lungs: Coarse breath sounds bilaterally  Cardiovascular: S1-S2 appreciated Abdomen: Soft, bowel sounds appreciated Extremities: No edema, multiple lower extremities Neuro: Responsive, not able to follow commands at baseline  I reviewed last 24 h vitals and pain scores, last 48 h intake and output, last 24 h labs and trends, and last 24 h imaging results. Blood culture positive for Proteus   Resolved problem list   Assessment and Plan   Septic shock - Off vasopressors - On meropenem  and vancomycin   Multiple wounds - Continue wound management  Acute on chronic respiratory failure -Continue mechanical ventilation -Target TVol 6-8cc/kgIBW -Target Plateau Pressure < 30cm H20 -Target driving pressure less than 15 cm of water  -Target PaO2 55-65: titrate PEEP/FiO2 per protocol -Ventilator associated pneumonia prevention protocol  Healthcare associated pneumonia - Continue antibiotics-Will continue to follow tracheal cultures  Uncontrolled diabetes - Transition to subcu insulin  - Continue SSI  Acute kidney injury - Avoid nephrotoxic medication Mental malperfusion Continue to trend  Hypernatremia - Increase free water   Hypothyroidism will continue Synthroid   The patient is critically ill with multiple organ systems failure and requires high complexity decision making for assessment and support, frequent evaluation and titration of therapies, application of advanced monitoring technologies and extensive interpretation of multiple databases. Critical Care Time devoted to patient care services described in this note independent of APP/resident time (if applicable)  is 33 minutes.  Jennet Epley MD [facility] Personal pager: See Amion If unanswered, please page CCM On-call: #[phone]  "

## 2024-12-28 NOTE — Plan of Care (Signed)
 Tube feeding resumed at 30 last night. Will increase to goal per CCM.

## 2024-12-28 NOTE — Evaluation (Signed)
 Physical Therapy Brief Evaluation and Discharge Note Patient Details Name: Joshua Haley MRN: [id number] DOB: 06/29/1951 Today's Date: 12/28/2024   History of Present Illness  Pt is 74 yo male who presents on 12/24/24 with concerns for sepsis. 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 All education has been completed and the patient has no further questions. Pt from [facility], per chart tot A for all care. On eval pt has eyes open, tolerates change in bed position without distress but unable to participate in care and does not follow commands. Has ome mvmt of RUE but not noted active movement of other 3 extremities. Pt with multiple areas of skin breakdown, if plan is to remain inpt for several more days recommend prevalon boots for BLE's. Recommend hoyer lift to chair with nsg when pt more stable.   No follow-up Physical Therapy. PT is signing off. Thank you for this referral.        PT Assessment Patient does not need any further PT services  Assistance Needed at Discharge  Frequent or constant Supervision/Assistance    Equipment Recommendations None recommended by PT  Recommendations for Other Services       Precautions/Restrictions Precautions Precautions: Other (comment) (contact) Restrictions Weight Bearing Restrictions Per Provider Order: No        Mobility  Bed Mobility       General bed mobility comments: changed HOB position and pt tolerated but no change in engagement in session. Dependent for rolling and changing positions. Sacral decubitus present  Transfers                   General transfer comment: unable at baseline. Hoyer to chair if OOB    Ambulation/Gait           General Gait Details:  unable  Home Activity Instructions    Stairs            Modified Rankin (Stroke Patients Only)        Balance Overall balance assessment:  (N/A)                        Pertinent Vitals/Pain PT - Brief Vital Signs All Vital Signs Stable: No Exception to Vital Signs Stable: pt with low grade fever, 99, down from 101 deg yesterday Pain Assessment Pain Assessment: PAINAD Breathing: normal Negative Vocalization: none Facial Expression: smiling or inexpressive Body Language: relaxed Consolability: no need to console PAINAD Score: 0     Home Living Family/patient expects to be discharged to:: Other (Comment) (LTACH)             Additional Comments: pt from [facility]    Prior Function Level of Independence: Needs assistance Comments: tot A for all functions. Has multiple decubiti.    UE/LE Assessment   UE ROM/Strength/Tone/Coordination: Impaired UE ROM/Strength/Tone/Coordination Deficits: no active mvmt noted LUE. RUE very stiff but does move fingers, wrist, and elbow minimally in gravity eliminated position. Does this spontaneously, not on command  LE ROM/Strength/Tone/Coordination: Impaired LE ROM/Strength/Tone/Coordination Deficits: B ankles planta flexed with heel wounds. If plan is to remain inpt for several more days would benefit from prevalons. Tolerates passive ROM B hips, knees, and ankles, all joints very stiff. No active motion of either LE noted on eval  Communication   Communication Communication: Impaired Factors Affecting Communication: Trach/intubated;Other (comment) (no attempt at communication)     Cognition Overall Cognitive Status: History of cognitive impairments - at baseline Comments: pt with eyes open, seems to track minimally when therapist on pt's R side. No commands followed with the exception of squeezing R hand but does this inconsistently     General Comments General comments (skin integrity, edema, etc.):  pt improving in medical stability but no functional change in status    Exercises     Assessment/Plan    PT Problem List         PT Visit Diagnosis Muscle weakness (generalized) (M62.81)    No Skilled PT Patient at baseline level of functioning;Patient will have necessary level of assist by caregiver at discharge   Co-evaluation                AMPAC 6 Clicks Help needed turning from your back to your side while in a flat bed without using bedrails?: Total Help needed moving from lying on your back to sitting on the side of a flat bed without using bedrails?: Total Help needed moving to and from a bed to a chair (including a wheelchair)?: Total Help needed standing up from a chair using your arms (e.g., wheelchair or bedside chair)?: Total Help needed to walk in hospital room?: Total Help needed climbing 3-5 steps with a railing? : Total 6 Click Score: 6      End of Session Equipment Utilized During Treatment: Oxygen Activity Tolerance: Patient tolerated treatment well Patient left: in bed;with call bell/phone within reach;with nursing/sitter in room Nurse Communication: Need for lift equipment PT Visit Diagnosis: Muscle weakness (generalized) (M62.81)     Time: 9199-9189 PT Time Calculation (min) (ACUTE ONLY): 10 min  Charges:   PT Evaluation $PT Eval Low Complexity: 1 Low      Richerd Lipoma, PT  Acute Rehab Services Secure chat preferred Office [phone]   Richerd CROME Bleu Moisan  12/28/2024, 8:22 AM

## 2024-12-28 NOTE — Progress Notes (Signed)
 OT Cancellation Note  Patient Details Name: KUBA SHEPHERD MRN: [id number] DOB: 1951-06-22   Cancelled Treatment:    Reason Eval/Treat Not Completed: Other (comment) (Upon chart review and discussion with PT, pt is total care at baseline, from [facility]. No skilled OT services warranted at this time, will sign-off.)   Doyne Ellinger M. [place], OTR/L [facility] Acute Rehabilitation Services [phone] Secure Chat Preferred  Tarris Delbene 12/28/2024, 4:01 PM

## 2024-12-29 LAB — GLUCOSE, CAPILLARY
Glucose-Capillary: 140 mg/dL — ABNORMAL HIGH (ref 70–99)
Glucose-Capillary: 124 mg/dL — ABNORMAL HIGH (ref 70–99)
Glucose-Capillary: 141 mg/dL — ABNORMAL HIGH (ref 70–99)
Glucose-Capillary: 138 mg/dL — ABNORMAL HIGH (ref 70–99)
Glucose-Capillary: 122 mg/dL — ABNORMAL HIGH (ref 70–99)
Glucose-Capillary: 114 mg/dL — ABNORMAL HIGH (ref 70–99)

## 2024-12-29 LAB — BASIC METABOLIC PANEL WITH GFR
Anion gap: 8 (ref 5–15)
BUN: 47 mg/dL — ABNORMAL HIGH (ref 8–23)
CO2: 27 mmol/L (ref 22–32)
Calcium: 9.5 mg/dL (ref 8.9–10.3)
Chloride: 117 mmol/L — ABNORMAL HIGH (ref 98–111)
Creatinine, Ser: 0.63 mg/dL (ref 0.61–1.24)
GFR, Estimated: >60 mL/min
Glucose, Bld: 160 mg/dL — ABNORMAL HIGH (ref 70–99)
Potassium: 3.9 mmol/L (ref 3.5–5.1)
Sodium: 152 mmol/L — ABNORMAL HIGH (ref 135–145)

## 2024-12-29 LAB — MAGNESIUM: Magnesium: 2.1 mg/dL (ref 1.7–2.4)

## 2024-12-29 LAB — CBC
HCT: 29.3 % — ABNORMAL LOW (ref 39.0–52.0)
Hemoglobin: 8.6 g/dL — ABNORMAL LOW (ref 13.0–17.0)
MCH: 27.7 pg (ref 26.0–34.0)
MCHC: 29.4 g/dL — ABNORMAL LOW (ref 30.0–36.0)
MCV: 94.2 fL (ref 80.0–100.0)
Platelets: 269 K/uL (ref 150–400)
RBC: 3.11 MIL/uL — ABNORMAL LOW (ref 4.22–5.81)
RDW: 18.8 % — ABNORMAL HIGH (ref 11.5–15.5)
WBC: 5.9 K/uL (ref 4.0–10.5)
nRBC: 0.7 % — ABNORMAL HIGH (ref 0.0–0.2)

## 2024-12-29 MED ORDER — SODIUM CHLORIDE 0.9 % IV SOLN: 1.0000 g | Freq: Three times a day (TID) | INTRAVENOUS | Status: DC

## 2024-12-29 MED ORDER — LACTATED RINGERS IV BOLUS
500.0000 mL | Freq: Once | INTRAVENOUS | Status: AC
  Administered 2024-12-29: 500 mL via INTRAVENOUS

## 2024-12-29 MED ORDER — MIDODRINE HCL 5 MG PO TABS
15.0000 mg | ORAL_TABLET | Freq: Three times a day (TID) | ORAL | Status: DC
  Administered 2024-12-29 – 2025-01-02 (×12): 15 mg
  Filled 2024-12-29 (×14): qty 3

## 2024-12-29 MED ORDER — VANCOMYCIN HCL IN DEXTROSE 1-5 GM/200ML-% IV SOLN: 1000.0000 mg | Freq: Two times a day (BID) | INTRAVENOUS | Status: DC

## 2024-12-29 MED ORDER — POTASSIUM & SODIUM PHOSPHATES 280-160-250 MG PO PACK
1.0000 | PACK | Freq: Four times a day (QID) | ORAL | Status: AC
  Administered 2024-12-29 (×2): 1 via ORAL
  Filled 2024-12-29 (×2): qty 1

## 2024-12-29 NOTE — Progress Notes (Signed)
" ° °  Progress Note   Date: 12/29/2024  Patient Name: Joshua Haley        MRN#: [id number]   Clarification of the diagnosis of pressure ulcer(s):    Multiple pressure ulcers as documented  L trochanter; Unstageable Pressure Injury, Left ischium: Stage 3 Pressure Injury L heel; Unstageable Pressure injury, Sacral; Stage 4 pressure injury, R trochanter: Deep Tissue Pressure Injury, Right ischium: Deep Tissue Pressure Injury, all present on admission (at the time of the admission order)  "

## 2024-12-29 NOTE — TOC Progression Note (Signed)
 Transition of Care [facility]) - Progression Note    Patient Details  Name: Joshua Haley MRN: [id number] Date of Birth: 04/06/1951  Transition of [facility]) CM/SW Contact  Roxie KANDICE Stain, RN Phone Number: 12/29/2024, 2:35 PM  Clinical Narrative:     DJ with [facility] started auth for LTAC.  Expected Discharge Plan: Long Term Nursing Home Barriers to Discharge: Continued Medical Work up               Expected Discharge Plan and Services In-house Referral: Clinical Social Work   Post Acute Care Choice: Nursing Home Living arrangements for the past 2 months: Skilled Nursing Facility                                       Social Drivers of Health (SDOH) Interventions SDOH Screenings   Food Insecurity: Patient Unable To Answer (10/15/2024)  Housing: Patient Unable To Answer (10/15/2024)  Transportation Needs: Patient Unable To Answer (10/15/2024)  Utilities: Patient Unable To Answer (10/15/2024)  Social Connections: Patient Unable To Answer (10/15/2024)  Tobacco Use: Medium Risk (12/24/2024)    Readmission Risk Interventions    10/16/2024    4:35 PM  Readmission Risk Prevention Plan  Transportation Screening Complete  PCP or Specialist Appt within 5-7 Days Complete  Home Care Screening Complete  Medication Review (RN CM) Complete

## 2024-12-29 NOTE — Plan of Care (Signed)
" °  Problem: Metabolic: Goal: Ability to maintain appropriate glucose levels will improve Outcome: Progressing   Problem: Nutritional: Goal: Maintenance of adequate nutrition will improve Outcome: Progressing   Problem: Tissue Perfusion: Goal: Adequacy of tissue perfusion will improve Outcome: Progressing   Problem: Cardiac: Goal: Ability to maintain an adequate cardiac output will improve Outcome: Progressing   Problem: Fluid Volume: Goal: Ability to achieve a balanced intake and output will improve Outcome: Progressing   "

## 2024-12-29 NOTE — Discharge Summary (Signed)
 Physician Discharge Summary  Patient ID: Joshua Haley MRN: [id number] DOB/AGE: 73/10/1951 73 y.o.  Admit date: 12/24/2024 Discharge date: 12/29/2024    Discharge Diagnoses:  Septic shock Proteus bacteremia Diabetic ketoacidosis  Been discharged in stable state                                                  DISCHARGE PLAN BY DIAGNOSIS   Septic shock -resolved, weaned off pressors   Proteus bacteremia - On meropenem -day 5 of 7  DKA resolved - Continue insulin  regimen  Chronic respiratory failure - Continue trach collar with oxygen as needed  Multiple decubiti - Continue wound management  Hypothyroidism -Continue Synthroid   History of traumatic brain injury - At baseline  Type 2 diabetes - Continue regimen  Anemia of chronic disease   Discharge Plan:  Complete course of antibiotics for 7 days   DISCHARGE SUMMARY  74 year old gentleman was admitted with sepsis, septic shock was on multiple pressors was able to wean off multiple pressors was also on the ventilator and able to wean off the ventilator and now on trach collar-his baseline Was recently hospitalized for sepsis, during recent admission was positive for Proteus-has been on vancomycin  and meropenem  Meropenem  to be completed for 7 days Hemodynamics have been stable Lab details have been stable - Hypernatremia being corrected with free water   Appears to have approached baseline         SIGNIFICANT DIAGNOSTIC STUDIES   MICRO DATA  Blood cultures - 1228 positive for Proteus mirabilis  MRSA PCR positive  ANTIBIOTICS  Meropenem  day 5 of 7  Vancomycin  completed  CONSULTS None  TUBES / LINES Peripheral IV left hand  Gastrostomy tube in place  Ureteral catheter was changed 4 days ago  Multiple pressure injuries coccyx, left proximal hip, left heel, left ischial tuberosity   Discharge Exam: General: Elderly, frail Neuro: Opens eyes, not able to follow commands CV: S1-S2 PULM:  Clear breath sounds GI: Bowel sounds appreciated Extremities: No clubbing, no edema  Vitals:   12/29/24 1504 12/29/24 1532 12/29/24 1600 12/29/24 1614  BP:   (!) 93/53   Pulse: 77 77 77 77  Resp: (!) 21 (!) 29 (!) 28 (!) 28  Temp:    97.7 F (36.5 C)  TempSrc:    Axillary  SpO2: 97% 93% 95% 93%  Weight:      Height:         Discharge Labs  BMET Recent Labs  Lab 12/25/24 0457 12/25/24 0902 12/25/24 1228 12/26/24 0358 12/27/24 0327 12/28/24 0404 12/29/24 0303  NA 146*   < > 143 144 147* 153* 152*  K 3.3*   < > 3.8 3.9 3.2* 3.9 3.9  CL 106   < > 107 110 113* 118* 117*  CO2 20*   < > 22 22 24 25 27   GLUCOSE 425*   < > 214* 223* 232* 132* 160*  BUN 63*   < > 58* 55* 53* 47* 47*  CREATININE 1.46*   < > 1.21 0.95 0.73 0.61 0.63  CALCIUM 9.0   < > 9.0 9.0 9.4 9.6 9.5  MG 1.8  --   --   --  2.1 2.1 2.1  PHOS 2.2*  --   --  2.0* 1.7* 2.3*  --    < > = values in this interval not displayed.  CBC Recent Labs  Lab 12/27/24 1324 12/28/24 0404 12/29/24 0303  HGB 8.2* 8.2* 8.6*  HCT 26.9* 27.2* 29.3*  WBC 7.8 6.8 5.9  PLT 223 231 269    Anti-Coagulation Recent Labs  Lab 12/24/24 2023  INR 1.4*          Allergies as of 12/29/2024       Reactions   Morphine And Codeine Other (See Comments)   Not specified on MAR    Penicillins Other (See Comments)   Cefepime  Other (See Comments)   Unknown reaction per MAR   Statins Nausea And Vomiting        Medication List     TAKE these medications    amantadine  50 MG/5ML solution Commonly known as: SYMMETREL  Place 100 mg into feeding tube daily. The timing of this medication is very important.   acetaminophen  325 MG tablet Commonly known as: TYLENOL  Place 650 mg into feeding tube every 6 (six) hours as needed for mild pain (pain score 1-3) or fever.   artificial tears ophthalmic solution Place 2 drops into both eyes as needed for dry eyes. What changed: when to take this   ascorbic acid  500 MG  tablet Commonly known as: VITAMIN C  Place 500 mg into feeding tube daily. Give 500mg  per tube daily   cetirizine 10 MG chewable tablet Commonly known as: ZYRTEC Place 10 mg into feeding tube daily.   Citrucel 500 MG Tabs Generic drug: Methylcellulose (Laxative) Place 1 tablet (500 mg total) into feeding tube 3 (three) times daily as needed (Constipation).   DEX4 FAST ACTING GLUCOSE PO Take 15 g by mouth as needed (for hypoglycemia).   docusate sodium  100 MG capsule Commonly known as: COLACE Take 1 capsule (100 mg total) by mouth 2 (two) times daily as needed for mild constipation.   enoxaparin  40 MG/0.4ML injection Commonly known as: LOVENOX  Inject 40 mg into the skin daily.   feeding supplement (GLUCERNA 1.5 CAL) Liqd Place 60 mL/hr into feeding tube See admin instructions. Give 49mL/hr per tube by shift   ferrous sulfate  300 (60 Fe) MG/5ML syrup Place 300 mg into feeding tube 2 (two) times daily.   Fiasp  FlexTouch 100 UNIT/ML FlexTouch Pen Generic drug: insulin  aspart Inject 0-15 Units into the skin every 6 (six) hours. Sliding scale insulin :  70-120= 0 units, 1 21-150= 2 units,  151-200= 3 units,  201-250= 5 units,  251-300= 8 units,  301-350= 11 units,  351-400= 15 units   furosemide  20 MG tablet Commonly known as: LASIX  Place 1 tablet (20 mg total) into feeding tube daily.   gatifloxacin  0.5 % Soln Commonly known as: ZYMAXID  Place 1 drop into both eyes 4 (four) times daily.   glucagon Emergency 1 MG Solr Inject 1 mg into the muscle as needed (for hypoglycemia).   Gvoke HypoPen 1-Pack 1 MG/0.2ML Soaj Generic drug: Glucagon Inject 1 mg into the skin as needed (for hypoglycemia).   insulin  glargine-yfgn 100 UNIT/ML Pen Commonly known as: SEMGLEE  Inject 15 Units into the skin at bedtime. What changed: how much to take   lansoprazole 30 MG disintegrating tablet Commonly known as: PREVACID SOLUTAB Place 30 mg into feeding tube in the morning.    levETIRAcetam  500 MG tablet Commonly known as: KEPPRA  Place 1 tablet (500 mg total) into feeding tube 2 (two) times daily.   levothyroxine  100 MCG tablet Commonly known as: SYNTHROID  Place 100 mcg into feeding tube daily. Give 100mcg per tube daily   liothyronine  5 MCG tablet Commonly  known as: CYTOMEL  Place 7.5 mcg into feeding tube every 12 (twelve) hours. Give one and one half tablet (7.5mg ) per tube every 12 hours   meropenem  1 g in sodium chloride  0.9 % 100 mL Inject 1 g into the vein every 8 (eight) hours.   midodrine  10 MG tablet Commonly known as: PROAMATINE  Place 1 tablet (10 mg total) into feeding tube 3 (three) times daily with meals.   modafinil  100 MG tablet Commonly known as: PROVIGIL  GIVE 1 TAB VIA TUBE ONCE DAILY   Nexletol 180 MG Tabs Generic drug: Bempedoic Acid Place 180 mg into feeding tube in the morning.   omega-3 acid ethyl esters 1 g capsule Commonly known as: LOVAZA  Place 1 g into feeding tube daily. Give 1 g per tube daily   ondansetron  4 MG disintegrating tablet Commonly known as: ZOFRAN -ODT Take 4 mg by mouth every 6 (six) hours as needed for nausea or vomiting (PER TUBE). Per tube   polyethylene glycol 17 g packet Commonly known as: MIRALAX  / GLYCOLAX  Give 17g per tube daily as needed for constipation   sertraline  25 MG tablet Commonly known as: ZOLOFT  Place 25 mg into feeding tube daily. Give 25mg  per tube daily   sodium chloride  irrigation 0.9 % irrigation Irrigate with 60 mLs as directed every 8 (eight) hours. Irrigate indwelling foley with 60mL sterile saline every 8 hours.   vancomycin  1-5 GM/200ML-% Soln Commonly known as: VANCOCIN  Inject 200 mLs (1,000 mg total) into the vein every 12 (twelve) hours. Start taking on: December 30, 2024          Disposition: Discharge to LTAC  Discharged Condition: Joshua Haley has met maximum benefit of inpatient care and is medically stable and cleared for discharge.  Patient is pending  follow up as above.      Time spent on disposition:  32 Minutes.   Signed: Jennet Epley, MD [facility] [phone]

## 2024-12-29 NOTE — Progress Notes (Signed)
 eLink Physician-Brief Progress Note Patient Name: Joshua Haley DOB: 1951-03-12 MRN: [id number]   Date of Service  12/29/2024  HPI/Events of Note  Has been off of norepinephrine , transition to midodrine , notified of borderline blood pressures.  Euvolemic for the past 24 hours, +5 L for the stay  eICU Interventions  500 cc LR bolus Increased dose of midodrine , first dose now     Intervention Category Intermediate Interventions: Hypotension - evaluation and management  Tino Ronan 12/29/2024, 6:23 AM

## 2024-12-29 NOTE — TOC Progression Note (Signed)
 Transition of Care [facility]) - Progression Note    Patient Details  Name: MD SMOLA MRN: [id number] Date of Birth: Dec 24, 1951  [facility]) CM/SW Contact  Lauraine FORBES Saa, LCSWA Phone Number: 12/29/2024, 12:09 PM  Clinical Narrative:     12:09 PM Per progressions, patient is eligible and medically able to discharge to [facility]. RNCM was made aware. TOC will continue to follow.  Expected Discharge Plan: Long Term Nursing Home Barriers to Discharge: Continued Medical Work up               Expected Discharge Plan and Services In-house Referral: Clinical Social Work   Post Acute Care Choice: Nursing Home Living arrangements for the past 2 months: Skilled Nursing Facility                                       Social Drivers of Health (SDOH) Interventions SDOH Screenings   Food Insecurity: Patient Unable To Answer (10/15/2024)  Housing: Patient Unable To Answer (10/15/2024)  Transportation Needs: Patient Unable To Answer (10/15/2024)  Utilities: Patient Unable To Answer (10/15/2024)  Social Connections: Patient Unable To Answer (10/15/2024)  Tobacco Use: Medium Risk (12/24/2024)    Readmission Risk Interventions    10/16/2024    4:35 PM  Readmission Risk Prevention Plan  Transportation Screening Complete  PCP or Specialist Appt within 5-7 Days Complete  Home Care Screening Complete  Medication Review (RN CM) Complete

## 2024-12-29 NOTE — Progress Notes (Signed)
 Nutrition Follow-up  DOCUMENTATION CODES:  Non-severe (moderate) malnutrition in context of chronic illness  INTERVENTION:  Tube feeding via g-tube: Glucerna 1.5 at 65 ml/h (1560 ml per day) Start advance by 10 mL every 8 hours to goal rate. Flushes: 200 mL every 4 hours per MD Provides 2340 kcal, 129 gm protein, 1184 ml free water  daily Juven BID, each packet provides 95 calories, 2.5 grams of protein (collagen), and micronutrients to support wound healing Continue Multivitamin w/ minerals daily  NUTRITION DIAGNOSIS:  Moderate Malnutrition related to chronic illness as evidenced by moderate fat depletion, moderate muscle depletion. - Ongoing  GOAL:  Patient will meet greater than or equal to 90% of their needs - Progressing   MONITOR:  TF tolerance, Skin, Vent status, Labs  REASON FOR ASSESSMENT:  Consult Enteral/tube feeding initiation and management, Wound healing  ASSESSMENT:  Pt with hx of TBI (trach and g-tube dependent), CAD, DM type 2, HTN, and hypothyroidism presented to ED from [facility]. Workup in ED consistent with sepsis. Pt with extensive wounds presented on admission.   12/29 - Admitted  12/31 - TF held due to emesis 01/01 - transitioned to trach collar  Discussed patient with RN. Feeds were decreased to 30 ml/hr due to episodes of emesis. Plan to start advancing feed by 10 mL every 8 hours. Abdomen soft, flexiseal still in place with no significant output.   Per TOC note, patient is medically able to return to [facility].   Temp (24hrs), Avg:98.3 F (36.8 C), Min:97.4 F (36.3 C), Max:98.8 F (37.1 C) MAP (cuff): 69 mmHg  Admit weight: 82 kg   Current weight: 84.2 kg   Drains/Lines: CVC Triple Lumen, right femoral PEG-J LUQ UOP 1705 mL out x 24 hours Tracheostomy 6mm shiley  Nutrition Related Medications: NovoLog  0-20 units q4h + 3 units q4h, Lantus  15 units daily, MVI, Juven, Zoloft  Drips Merrem  Vancomycin  Labs: Sodium 152, Potassium 3.9, BUN  47, Magnesium  2.1  CBG: 115-135 mg/dL x 24 hrs   Diet Order:   Diet Order             Diet NPO time specified  Diet effective now                  EDUCATION NEEDS:  Not appropriate for education at this time  Skin:  Skin Assessment: Skin Integrity Issues: Per WOC note 12/29: Wound type: L trochanter; Unstageable Pressure Injury Left ischium: Stage 3 Pressure Injury L heel; Unstageable Pressure injury Sacral; Stage 4 pressure injury R trochanter: Deep Tissue Pressure Injury Right ischium: Deep Tissue Pressure Injury  Last BM:  1/2 - Type 7 (small)  Height:  Ht Readings from Last 1 Encounters:  12/25/24 6' (1.829 m)   Weight:  Wt Readings from Last 1 Encounters:  12/29/24 84.2 kg   Ideal Body Weight:  80.9 kg  BMI:  Body mass index is 25.18 kg/m.  Estimated Nutritional Needs:  Kcal:  2200-2500 kcal/d Protein:  120-135g/d Fluid:  >2.2L/d   Nestora Glatter RD, LDN Registered Dietitian I Please see AMION for contact information

## 2024-12-29 NOTE — Progress Notes (Signed)
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  He was admitted 12/8 with Sepsis as well and d/c on 12/12.  He was treated for severe sepsis, Pneumonia and UTI and he was treated with Meropenem . He now presents again with fevers, hypotension requiring 35 mcg Levophed  and Vasopressin .  Patient found perspiring and mottled on peripheral Levophed  and Vasopressin .  Also BG >700.  NA 154, Cr 1.70, LA 11.9, WBC 11.7, HgB 11.9, Plt 512, BHA 1.14.  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 rate (!) 28, height 6' (1.829 m), weight 84.2 kg, SpO2 97%.    FiO2 (%):  [35 %-45 %] 45 %   Intake/Output Summary (Last 24 hours) at 12/29/2024 1717 Last data filed at 12/29/2024 1620 Gross per 24 hour  Intake 3200.52 ml  Output 1490 ml  Net 1710.52 ml   Filed Weights   12/25/24 0800 12/28/24 0500 12/29/24 0405  Weight: 83.6 kg 81.5 kg 84.2 kg    Examination: General: Chronically ill-appearing ive HENT: Pupils equal and reactive Lungs: Fair air entry bilaterally, clear Cardiovascular: S1-S2  appreciated Abdomen: Soft, bowel sounds appreciated Extremities: No edema, no clubbing, multiple decubitus Neuro: Responsive, not able to follow commands  Lab data reviewed with positive culture for Proteus, hypernatremia  Resolved problem list   Assessment and Plan   Septic shock - Off vasopressors - Remain on meropenem  - Discontinue vancomycin   Multiple wounds - Continue wound management  Acute on chronic respiratory failure - Was able to transition to trach collar  Healthcare associated pneumonia Continue antibiotics - Tracheal cultures not showing any organisms at present  Uncontrolled diabetes - Transition to subcu insulin  - Continue SSI  Acute kidney injury - Avoid nephrotoxic medications -Continue to trend renal parameters  Hypernatremia - Continue free water   Hypothyroidism - Continue Synthroid   Baby stable enough to be discharged to [facility] today  The patient is critically ill with multiple organ systems failure and requires high complexity decision making for assessment and support, frequent evaluation and titration of therapies, application of advanced monitoring technologies and extensive interpretation of multiple databases. Critical Care Time devoted to patient care services described in this note independent of APP/resident time (if applicable)  is 30 minutes.   Jennet Epley MD [place] Personal pager: See Amion If unanswered, please page   "

## 2024-12-30 ENCOUNTER — Inpatient Hospital Stay (HOSPITAL_COMMUNITY): Payer: PRIVATE HEALTH INSURANCE

## 2024-12-30 DIAGNOSIS — E119 Type 2 diabetes mellitus without complications: Secondary | ICD-10-CM | POA: Diagnosis not present

## 2024-12-30 DIAGNOSIS — Z79890 Hormone replacement therapy (postmenopausal): Secondary | ICD-10-CM

## 2024-12-30 DIAGNOSIS — R6521 Severe sepsis with septic shock: Secondary | ICD-10-CM | POA: Diagnosis not present

## 2024-12-30 DIAGNOSIS — E870 Hyperosmolality and hypernatremia: Secondary | ICD-10-CM

## 2024-12-30 DIAGNOSIS — A419 Sepsis, unspecified organism: Secondary | ICD-10-CM | POA: Diagnosis not present

## 2024-12-30 DIAGNOSIS — N179 Acute kidney failure, unspecified: Secondary | ICD-10-CM | POA: Diagnosis not present

## 2024-12-30 DIAGNOSIS — E039 Hypothyroidism, unspecified: Secondary | ICD-10-CM | POA: Diagnosis not present

## 2024-12-30 DIAGNOSIS — J9620 Acute and chronic respiratory failure, unspecified whether with hypoxia or hypercapnia: Secondary | ICD-10-CM | POA: Diagnosis not present

## 2024-12-30 LAB — GLUCOSE, CAPILLARY
Glucose-Capillary: 104 mg/dL — ABNORMAL HIGH (ref 70–99)
Glucose-Capillary: 111 mg/dL — ABNORMAL HIGH (ref 70–99)
Glucose-Capillary: 119 mg/dL — ABNORMAL HIGH (ref 70–99)
Glucose-Capillary: 142 mg/dL — ABNORMAL HIGH (ref 70–99)
Glucose-Capillary: 143 mg/dL — ABNORMAL HIGH (ref 70–99)
Glucose-Capillary: 166 mg/dL — ABNORMAL HIGH (ref 70–99)

## 2024-12-30 LAB — MAGNESIUM: Magnesium: 2.2 mg/dL (ref 1.7–2.4)

## 2024-12-30 LAB — CULTURE, BLOOD (ROUTINE X 2): Culture: NO GROWTH

## 2024-12-30 LAB — BASIC METABOLIC PANEL WITH GFR
Anion gap: 11 (ref 5–15)
BUN: 50 mg/dL — ABNORMAL HIGH (ref 8–23)
CO2: 22 mmol/L (ref 22–32)
Calcium: 9.4 mg/dL (ref 8.9–10.3)
Chloride: 114 mmol/L — ABNORMAL HIGH (ref 98–111)
Creatinine, Ser: 0.63 mg/dL (ref 0.61–1.24)
GFR, Estimated: >60 mL/min
Glucose, Bld: 104 mg/dL — ABNORMAL HIGH (ref 70–99)
Potassium: 4 mmol/L (ref 3.5–5.1)
Sodium: 147 mmol/L — ABNORMAL HIGH (ref 135–145)

## 2024-12-30 LAB — CBC
HCT: 28.8 % — ABNORMAL LOW (ref 39.0–52.0)
Hemoglobin: 8.4 g/dL — ABNORMAL LOW (ref 13.0–17.0)
MCH: 27.8 pg (ref 26.0–34.0)
MCHC: 29.2 g/dL — ABNORMAL LOW (ref 30.0–36.0)
MCV: 95.4 fL (ref 80.0–100.0)
Platelets: 360 K/uL (ref 150–400)
RBC: 3.02 MIL/uL — ABNORMAL LOW (ref 4.22–5.81)
RDW: 18.8 % — ABNORMAL HIGH (ref 11.5–15.5)
WBC: 5.9 K/uL (ref 4.0–10.5)
nRBC: 0.5 % — ABNORMAL HIGH (ref 0.0–0.2)

## 2024-12-30 LAB — PHOSPHORUS: Phosphorus: 3.4 mg/dL (ref 2.5–4.6)

## 2024-12-30 MED ORDER — IPRATROPIUM-ALBUTEROL 0.5-2.5 (3) MG/3ML IN SOLN
3.0000 mL | Freq: Four times a day (QID) | RESPIRATORY_TRACT | Status: DC
  Administered 2024-12-31 – 2025-01-01 (×6): 3 mL via RESPIRATORY_TRACT
  Filled 2024-12-30 (×6): qty 3

## 2024-12-30 NOTE — Plan of Care (Signed)
 Pt does not FC. Copious thick tan tracheal secretions, suctioned q1-3h. One episode of desat to 85%, spo2 95% after suction.  Rass 0 to -1. UOP: . STOOL: , bag changed. Q2 turn.  Problem: Education: Goal: Ability to describe self-care measures that may prevent or decrease complications (Diabetes Survival Skills Education) will improve Outcome: Progressing Goal: Individualized Educational Video(s) Outcome: Progressing   Problem: Coping: Goal: Ability to adjust to condition or change in health will improve Outcome: Progressing   Problem: Fluid Volume: Goal: Ability to maintain a balanced intake and output will improve Outcome: Progressing   Problem: Health Behavior/Discharge Planning: Goal: Ability to identify and utilize available resources and services will improve Outcome: Progressing Goal: Ability to manage health-related needs will improve Outcome: Progressing   Problem: Metabolic: Goal: Ability to maintain appropriate glucose levels will improve Outcome: Progressing   Problem: Nutritional: Goal: Maintenance of adequate nutrition will improve Outcome: Progressing Goal: Progress toward achieving an optimal weight will improve Outcome: Progressing   Problem: Skin Integrity: Goal: Risk for impaired skin integrity will decrease Outcome: Progressing   Problem: Tissue Perfusion: Goal: Adequacy of tissue perfusion will improve Outcome: Progressing   Problem: Education: Goal: Ability to describe self-care measures that may prevent or decrease complications (Diabetes Survival Skills Education) will improve Outcome: Progressing Goal: Individualized Educational Video(s) Outcome: Progressing   Problem: Cardiac: Goal: Ability to maintain an adequate cardiac output will improve Outcome: Progressing   Problem: Health Behavior/Discharge Planning: Goal: Ability to identify and utilize available resources and services will improve Outcome: Progressing Goal: Ability to  manage health-related needs will improve Outcome: Progressing   Problem: Fluid Volume: Goal: Ability to achieve a balanced intake and output will improve Outcome: Progressing   Problem: Metabolic: Goal: Ability to maintain appropriate glucose levels will improve Outcome: Progressing   Problem: Nutritional: Goal: Maintenance of adequate nutrition will improve Outcome: Progressing Goal: Maintenance of adequate weight for body size and type will improve Outcome: Progressing   Problem: Respiratory: Goal: Will regain and/or maintain adequate ventilation Outcome: Progressing   Problem: Urinary Elimination: Goal: Ability to achieve and maintain adequate renal perfusion and functioning will improve Outcome: Progressing   Problem: Education: Goal: Knowledge of General Education information will improve Description: Including pain rating scale, medication(s)/side effects and non-pharmacologic comfort measures Outcome: Progressing   Problem: Health Behavior/Discharge Planning: Goal: Ability to manage health-related needs will improve Outcome: Progressing   Problem: Clinical Measurements: Goal: Ability to maintain clinical measurements within normal limits will improve Outcome: Progressing Goal: Will remain free from infection Outcome: Progressing Goal: Diagnostic test results will improve Outcome: Progressing Goal: Respiratory complications will improve Outcome: Progressing Goal: Cardiovascular complication will be avoided Outcome: Progressing   Problem: Activity: Goal: Risk for activity intolerance will decrease Outcome: Progressing   Problem: Nutrition: Goal: Adequate nutrition will be maintained Outcome: Progressing   Problem: Coping: Goal: Level of anxiety will decrease Outcome: Progressing   Problem: Elimination: Goal: Will not experience complications related to bowel motility Outcome: Progressing Goal: Will not experience complications related to urinary  retention Outcome: Progressing   Problem: Pain Managment: Goal: General experience of comfort will improve and/or be controlled Outcome: Progressing   Problem: Safety: Goal: Ability to remain free from injury will improve Outcome: Progressing   Problem: Skin Integrity: Goal: Risk for impaired skin integrity will decrease Outcome: Progressing

## 2024-12-30 NOTE — Progress Notes (Signed)
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  He was admitted 12/8 with Sepsis as well and d/c on 12/12.  He was treated for severe sepsis, Pneumonia and UTI and he was treated with Meropenem . He now presents again with fevers, hypotension requiring 35 mcg Levophed  and Vasopressin .  Patient found perspiring and mottled on peripheral Levophed  and Vasopressin .  Also BG >700.  NA 154, Cr 1.70, LA 11.9, WBC 11.7, HgB 11.9, Plt 512, BHA 1.14.  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 rate (!) 30, height 6' (1.829 m), weight 83.5 kg, SpO2 97%.    FiO2 (%):  [45 %-70 %] 70 %   Intake/Output Summary (Last 24 hours) at 12/30/2024 0853 Last data filed at 12/30/2024 0801 Gross per 24 hour  Intake 3269.83 ml  Output 1705 ml  Net 1564.83 ml   Filed Weights   12/28/24 0500 12/29/24 0405 12/30/24 0427  Weight: 81.5 kg 84.2 kg 83.5 kg    Examination: General: Chronically ill-appearing ive HENT: Pupils equal and reactive Lungs: Fair air entry bilaterally,  clear Cardiovascular: S1-S2 appreciated Abdomen: Soft, bowel sounds appreciated Extremities: No edema, no clubbing, multiple decubitus and some contractures Neuro: Responsive, not able to follow commands  Lab data reviewed with positive culture for Proteus, hypernatremia  Resolved problem list   Assessment and Plan   Septic shock, bacteremia - Off vasopressors - Remain on meropenem - blood cultures growing enterobacterales and proteus - repeat blood cultures today  Multiple wounds - Continue wound management  Acute on chronic respiratory failure - Was able to transition to trach collar, continue TC  ? Healthcare associated pneumonia Continue antibiotics - Tracheal cultures not showing any organisms at present  Uncontrolled diabetes - Transition to subcu insulin  - Continue SSI  Acute kidney injury - Avoid nephrotoxic medications -Continue to trend renal parameters  Hypernatremia - improving, Continue free water   Hypothyroidism - Continue Synthroid   Awaiting [facility] bed If unable will transfer to floor    Faithanne Verret MD [facility] EPIC chat 7am-7pm,  If unanswered, please page CCM On-call: #[phone]  For overnight concerns for ICU patients, call E link    "

## 2024-12-30 NOTE — TOC Progression Note (Addendum)
 Transition of Care [facility]) - Progression Note    Patient Details  Name: Joshua Haley MRN: [id number] Date of Birth: 1951-02-02  [facility] CM/SW Contact  Marval Gell, RN Phone Number: 12/30/2024, 8:42 AM  Clinical Narrative:     Reached out to DJ with [facility] to check on authorization and bed availability.   09:15 Spoke w DJ, he states that their plan is for DC to [facility] Monday.   Expected Discharge Plan: Long Term Nursing Home Barriers to Discharge: Continued Medical Work up               Expected Discharge Plan and Services In-house Referral: Clinical Social Work   Post Acute Care Choice: Nursing Home Living arrangements for the past 2 months: Skilled Nursing Facility Expected Discharge Date: 12/29/24                                     Social Drivers of Health (SDOH) Interventions SDOH Screenings   Food Insecurity: Patient Unable To Answer (10/15/2024)  Housing: Patient Unable To Answer (10/15/2024)  Transportation Needs: Patient Unable To Answer (10/15/2024)  Utilities: Patient Unable To Answer (10/15/2024)  Social Connections: Patient Unable To Answer (10/15/2024)  Tobacco Use: Medium Risk (12/24/2024)    Readmission Risk Interventions    10/16/2024    4:35 PM  Readmission Risk Prevention Plan  Transportation Screening Complete  PCP or Specialist Appt within 5-7 Days Complete  Home Care Screening Complete  Medication Review (RN CM) Complete

## 2024-12-30 NOTE — Plan of Care (Signed)
" °  Problem: Education: Goal: Ability to describe self-care measures that may prevent or decrease complications (Diabetes Survival Skills Education) will improve Outcome: Progressing Goal: Individualized Educational Video(s) Outcome: Progressing   Problem: Coping: Goal: Ability to adjust to condition or change in health will improve Outcome: Progressing   Problem: Fluid Volume: Goal: Ability to maintain a balanced intake and output will improve Outcome: Progressing   Problem: Metabolic: Goal: Ability to maintain appropriate glucose levels will improve Outcome: Progressing   Problem: Nutritional: Goal: Maintenance of adequate nutrition will improve Outcome: Progressing Goal: Progress toward achieving an optimal weight will improve Outcome: Progressing   Problem: Skin Integrity: Goal: Risk for impaired skin integrity will decrease Outcome: Progressing   Problem: Tissue Perfusion: Goal: Adequacy of tissue perfusion will improve Outcome: Progressing   Problem: Cardiac: Goal: Ability to maintain an adequate cardiac output will improve Outcome: Progressing   Problem: Metabolic: Goal: Ability to maintain appropriate glucose levels will improve Outcome: Progressing   Problem: Respiratory: Goal: Will regain and/or maintain adequate ventilation Outcome: Progressing   Problem: Urinary Elimination: Goal: Ability to achieve and maintain adequate renal perfusion and functioning will improve Outcome: Progressing   Problem: Clinical Measurements: Goal: Ability to maintain clinical measurements within normal limits will improve Outcome: Progressing Goal: Will remain free from infection Outcome: Progressing Goal: Diagnostic test results will improve Outcome: Progressing Goal: Respiratory complications will improve Outcome: Progressing Goal: Cardiovascular complication will be avoided Outcome: Progressing   Problem: Activity: Goal: Risk for activity intolerance will  decrease Outcome: Progressing   Problem: Pain Managment: Goal: General experience of comfort will improve and/or be controlled Outcome: Progressing   Problem: Safety: Goal: Ability to remain free from injury will improve Outcome: Progressing   Problem: Skin Integrity: Goal: Risk for impaired skin integrity will decrease Outcome: Progressing   "

## 2024-12-31 DIAGNOSIS — A419 Sepsis, unspecified organism: Secondary | ICD-10-CM | POA: Diagnosis not present

## 2024-12-31 DIAGNOSIS — R6521 Severe sepsis with septic shock: Secondary | ICD-10-CM | POA: Diagnosis not present

## 2024-12-31 LAB — BASIC METABOLIC PANEL WITH GFR
Anion gap: 9 (ref 5–15)
BUN: 49 mg/dL — ABNORMAL HIGH (ref 8–23)
CO2: 24 mmol/L (ref 22–32)
Calcium: 9.4 mg/dL (ref 8.9–10.3)
Chloride: 112 mmol/L — ABNORMAL HIGH (ref 98–111)
Creatinine, Ser: 0.63 mg/dL (ref 0.61–1.24)
GFR, Estimated: >60 mL/min
Glucose, Bld: 161 mg/dL — ABNORMAL HIGH (ref 70–99)
Potassium: 4.2 mmol/L (ref 3.5–5.1)
Sodium: 145 mmol/L (ref 135–145)

## 2024-12-31 LAB — CBC
HCT: 28.1 % — ABNORMAL LOW (ref 39.0–52.0)
Hemoglobin: 8.5 g/dL — ABNORMAL LOW (ref 13.0–17.0)
MCH: 27.8 pg (ref 26.0–34.0)
MCHC: 30.2 g/dL (ref 30.0–36.0)
MCV: 91.8 fL (ref 80.0–100.0)
Platelets: 503 K/uL — ABNORMAL HIGH (ref 150–400)
RBC: 3.06 MIL/uL — ABNORMAL LOW (ref 4.22–5.81)
RDW: 18.5 % — ABNORMAL HIGH (ref 11.5–15.5)
WBC: 7.5 K/uL (ref 4.0–10.5)
nRBC: 0.5 % — ABNORMAL HIGH (ref 0.0–0.2)

## 2024-12-31 LAB — CULTURE, BLOOD (ROUTINE X 2): Special Requests: ADEQUATE

## 2024-12-31 LAB — GLUCOSE, CAPILLARY
Glucose-Capillary: 139 mg/dL — ABNORMAL HIGH (ref 70–99)
Glucose-Capillary: 135 mg/dL — ABNORMAL HIGH (ref 70–99)
Glucose-Capillary: 123 mg/dL — ABNORMAL HIGH (ref 70–99)
Glucose-Capillary: 133 mg/dL — ABNORMAL HIGH (ref 70–99)
Glucose-Capillary: 141 mg/dL — ABNORMAL HIGH (ref 70–99)

## 2024-12-31 LAB — MAGNESIUM: Magnesium: 2.2 mg/dL (ref 1.7–2.4)

## 2024-12-31 MED ORDER — INSULIN GLARGINE-YFGN 100 UNIT/ML ~~LOC~~ SOLN: 15.0000 [IU] | Freq: Every day | SUBCUTANEOUS | Status: DC

## 2024-12-31 MED ORDER — INSULIN GLARGINE 100 UNIT/ML ~~LOC~~ SOLN
15.0000 [IU] | Freq: Every day | SUBCUTANEOUS | Status: DC
  Filled 2024-12-31: qty 0.15

## 2024-12-31 MED ORDER — INSULIN GLARGINE-YFGN 100 UNIT/ML ~~LOC~~ SOLN
15.0000 [IU] | Freq: Every day | SUBCUTANEOUS | Status: DC
  Administered 2024-12-31 – 2025-01-01 (×2): 15 [IU] via SUBCUTANEOUS
  Filled 2024-12-31 (×3): qty 0.15

## 2024-12-31 NOTE — Progress Notes (Addendum)
 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    He was admitted to from 12/8 to 12/12 with sepsis due to Pneumonia and UTI and he was treated with Meropenem . Urine cultures during that admission grew MDR Klebsiella and E. Coli.  He now presented again on 12/28 with fevers, hypotension requiring Levophed  and Vasopressin .  He was started on broad-spectrum antibiotics, mechanical ventilation an acuted was admitted to the ICU.  Diagnosed with DKA and started on DKA protocol. Wound care was consulted due to multiple decubitus ulcers. Blood cultures grew Proteus mirabilis and cutibacterium acnes (contaminant).   Patient became more responsive with treatment.  Was able to transition off mechanical ventilation to trach collar on 1/1.  Weaned off pressors on 12/30.  The plan is to discharge the patient to LTAC.  He was transferred to the hospitalist service pending LTAC transfer.   Assessment and Plan:  Septic shock, present on admission Due to Proteus bacteremia, pneumonia. Patient is s/p ICU stay and required pressors from 12/28 to 12/20. Sepsis has resolved.  Proteus bacteremia. Likely in the setting of recurrent UTIs with history of nephrolithiasis. -Continue meropenem  till 01/01/2025.  Pneumonia Presented with septic shock, acute on chronic respiratory failure, CT findings suggestive of pneumonia. Patient has been treated with antibiotics. - Continue meropenem  till 01/01/2025 as indicated above.   Multiple decubitus ulcers with concern for infection Patient has multiple decubitus ulcers involving trochanters, ischium, heel,  sacrum Stage III ulcer left extremity, stage IV ulcer on sacrum. Wound care team was consulted. - Will continue with wound care recs.  Acute on chronic respiratory failure Patient has chronic trach. Required mechanical ventilation on presentation. Transitioned to trach collar on 1/1. - Continue oxygen by trach mask.  T2DM Presented in DKA. A1c was 7.7 on 10/13/2024. Blood sugars now well-controlled. - Continue current regimen.  Tube feed dependent with G-tube - Continue tube feeds, free water  flushes  Hypothyroidism - Continue home Synthroid , Cytomel        Subjective: Patient not responding to questions  Physical Exam: BP (!) 98/50   Pulse 78   Temp 98.2 F (36.8 C) (Oral)   Resp (!) 30   Ht 6' (1.829 m)   Wt 81.1 kg   SpO2 93%   BMI 24.25 kg/m     General: Awake but with distant gaze, not responding verbally Eyes: Pupils equal, reactive  Oral cavity: moist mucous membranes  Head: Atraumatic, normocephalic  Neck: Trach + Chest: diminished breath sounds CVS: S1,S2 RRR. No murmurs  Abd: No distention, soft, non-tender. No masses palpable  Extr: Pedal edema     Data Reviewed:    Latest Ref Rng & Units 12/31/2024    2:29 AM 12/30/2024    3:58 AM 12/29/2024    3:03 AM  CBC  WBC 4.0 - 10.5 K/uL 7.5  5.9  5.9   Hemoglobin 13.0 - 17.0 g/dL 8.5  8.4  8.6   Hematocrit 39.0 - 52.0 % 28.1  28.8  29.3   Platelets 150 - 400 K/uL 503  360  269       Latest Ref Rng & Units 12/31/2024  2:29 AM 12/30/2024    3:58 AM 12/29/2024    3:03 AM  BMP  Glucose 70 - 99 mg/dL 838  895  839   BUN 8 - 23 mg/dL 49  50  47   Creatinine 0.61 - 1.24 mg/dL 9.36  9.36  9.36   Sodium 135 - 145 mmol/L 145  147  152   Potassium 3.5 - 5.1 mmol/L 4.2  4.0  3.9   Chloride 98 - 111 mmol/L 112  114  117   CO2 22 - 32 mmol/L 24  22  27    Calcium 8.9 - 10.3 mg/dL 9.4  9.4  9.5      Family Communication: n/a  Disposition: Status is: Inpatient Remains inpatient appropriate because:  Continue on IV antibiotics  DVT ppx: SQ heparin       Author: MDALA-GAUSI, Maily Debarge AGATHA, MD 12/31/2024 1:57 PM  For on call review [email or web address].    "

## 2024-12-31 NOTE — Plan of Care (Signed)
" °  Problem: Education: Goal: Ability to describe self-care measures that may prevent or decrease complications (Diabetes Survival Skills Education) will improve Outcome: Progressing Goal: Individualized Educational Video(s) Outcome: Progressing   Problem: Coping: Goal: Ability to adjust to condition or change in health will improve Outcome: Progressing   Problem: Fluid Volume: Goal: Ability to maintain a balanced intake and output will improve Outcome: Progressing   Problem: Health Behavior/Discharge Planning: Goal: Ability to identify and utilize available resources and services will improve Outcome: Progressing Goal: Ability to manage health-related needs will improve Outcome: Progressing   Problem: Metabolic: Goal: Ability to maintain appropriate glucose levels will improve Outcome: Progressing   Problem: Nutritional: Goal: Maintenance of adequate nutrition will improve Outcome: Progressing Goal: Progress toward achieving an optimal weight will improve Outcome: Progressing   Problem: Skin Integrity: Goal: Risk for impaired skin integrity will decrease Outcome: Progressing   Problem: Education: Goal: Ability to describe self-care measures that may prevent or decrease complications (Diabetes Survival Skills Education) will improve Outcome: Progressing Goal: Individualized Educational Video(s) Outcome: Progressing   Problem: Cardiac: Goal: Ability to maintain an adequate cardiac output will improve Outcome: Progressing   Problem: Health Behavior/Discharge Planning: Goal: Ability to identify and utilize available resources and services will improve Outcome: Progressing Goal: Ability to manage health-related needs will improve Outcome: Progressing   Problem: Metabolic: Goal: Ability to maintain appropriate glucose levels will improve Outcome: Progressing   Problem: Fluid Volume: Goal: Ability to achieve a balanced intake and output will improve Outcome:  Progressing   Problem: Respiratory: Goal: Will regain and/or maintain adequate ventilation Outcome: Progressing   Problem: Urinary Elimination: Goal: Ability to achieve and maintain adequate renal perfusion and functioning will improve Outcome: Progressing   Problem: Clinical Measurements: Goal: Ability to maintain clinical measurements within normal limits will improve Outcome: Progressing Goal: Will remain free from infection Outcome: Progressing Goal: Diagnostic test results will improve Outcome: Progressing Goal: Respiratory complications will improve Outcome: Progressing Goal: Cardiovascular complication will be avoided Outcome: Progressing   Problem: Activity: Goal: Risk for activity intolerance will decrease Outcome: Progressing   Problem: Nutrition: Goal: Adequate nutrition will be maintained Outcome: Progressing   Problem: Coping: Goal: Level of anxiety will decrease Outcome: Progressing   Problem: Elimination: Goal: Will not experience complications related to bowel motility Outcome: Progressing Goal: Will not experience complications related to urinary retention Outcome: Progressing   Problem: Pain Managment: Goal: General experience of comfort will improve and/or be controlled Outcome: Progressing   Problem: Safety: Goal: Ability to remain free from injury will improve Outcome: Progressing   "

## 2025-01-01 DIAGNOSIS — A419 Sepsis, unspecified organism: Secondary | ICD-10-CM | POA: Diagnosis not present

## 2025-01-01 DIAGNOSIS — R6521 Severe sepsis with septic shock: Secondary | ICD-10-CM | POA: Diagnosis not present

## 2025-01-01 LAB — BASIC METABOLIC PANEL WITH GFR
Anion gap: 10 (ref 5–15)
BUN: 49 mg/dL — ABNORMAL HIGH (ref 8–23)
CO2: 22 mmol/L (ref 22–32)
Calcium: 9.7 mg/dL (ref 8.9–10.3)
Chloride: 108 mmol/L (ref 98–111)
Creatinine, Ser: 0.67 mg/dL (ref 0.61–1.24)
GFR, Estimated: >60 mL/min
Glucose, Bld: 118 mg/dL — ABNORMAL HIGH (ref 70–99)
Potassium: 4.6 mmol/L (ref 3.5–5.1)
Sodium: 141 mmol/L (ref 135–145)

## 2025-01-01 LAB — GLUCOSE, CAPILLARY
Glucose-Capillary: 113 mg/dL — ABNORMAL HIGH (ref 70–99)
Glucose-Capillary: 114 mg/dL — ABNORMAL HIGH (ref 70–99)
Glucose-Capillary: 120 mg/dL — ABNORMAL HIGH (ref 70–99)
Glucose-Capillary: 132 mg/dL — ABNORMAL HIGH (ref 70–99)
Glucose-Capillary: 149 mg/dL — ABNORMAL HIGH (ref 70–99)
Glucose-Capillary: 89 mg/dL (ref 70–99)

## 2025-01-01 LAB — CBC
HCT: 27 % — ABNORMAL LOW (ref 39.0–52.0)
Hemoglobin: 8 g/dL — ABNORMAL LOW (ref 13.0–17.0)
MCH: 27.2 pg (ref 26.0–34.0)
MCHC: 29.6 g/dL — ABNORMAL LOW (ref 30.0–36.0)
MCV: 91.8 fL (ref 80.0–100.0)
Platelets: 527 K/uL — ABNORMAL HIGH (ref 150–400)
RBC: 2.94 MIL/uL — ABNORMAL LOW (ref 4.22–5.81)
RDW: 18.5 % — ABNORMAL HIGH (ref 11.5–15.5)
WBC: 8 K/uL (ref 4.0–10.5)
nRBC: 0.4 % — ABNORMAL HIGH (ref 0.0–0.2)

## 2025-01-01 MED ORDER — IPRATROPIUM-ALBUTEROL 0.5-2.5 (3) MG/3ML IN SOLN: 3.0000 mL | Freq: Four times a day (QID) | RESPIRATORY_TRACT | Status: DC | PRN

## 2025-01-01 NOTE — Plan of Care (Signed)

## 2025-01-01 NOTE — TOC Progression Note (Signed)
 Transition of Care [facility]) - Progression Note    Patient Details  Name: Joshua Haley MRN: [id number] Date of Birth: [birth year]  Transition of Care [facility] CM/SW Contact  Sherline Clack, [place] Phone Number: 01/01/2025, 11:05 AM  Clinical Narrative:     CSW reached out to DJ with [facility] to ask if facility was able to accept patient back today. DJ informed CSW patient's auth for [facility] is still pending at this time. DJ endorses reaching out to CSW once insurance authorization has been approved. Provider made aware. CSW will continue to follow and update DC plan.   Expected Discharge Plan: Long Term Nursing Home Barriers to Discharge: Continued Medical Work up               Expected Discharge Plan and Services In-house Referral: Clinical Social Work   Post Acute Care Choice: Nursing Home Living arrangements for the past 2 months: Skilled Nursing Facility Expected Discharge Date: 12/29/24                                     Social Drivers of Health (SDOH) Interventions SDOH Screenings   Food Insecurity: Patient Unable To Answer (01/01/2025)  Housing: Unknown (01/01/2025)  Transportation Needs: Patient Unable To Answer (01/01/2025)  Utilities: Patient Unable To Answer (01/01/2025)  Social Connections: Patient Unable To Answer (01/01/2025)  Tobacco Use: Medium Risk (12/24/2024)    Readmission Risk Interventions    10/16/2024    4:35 PM  Readmission Risk Prevention Plan  Transportation Screening Complete  PCP or Specialist Appt within 5-7 Days Complete  Home Care Screening Complete  Medication Review (RN CM) Complete

## 2025-01-01 NOTE — Progress Notes (Addendum)
 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    He was admitted to from 12/8 to 12/12 with sepsis due to Pneumonia and UTI and he was treated with Meropenem . Urine cultures during that admission grew MDR Klebsiella and E. Coli.  He now presented again on 12/28 with fevers, hypotension requiring Levophed  and Vasopressin .  He was started on broad-spectrum antibiotics, mechanical ventilation and was admitted to the ICU.  Diagnosed with DKA and started on DKA protocol. Wound care was consulted due to multiple decubitus ulcers. Blood cultures grew Proteus mirabilis and cutibacterium acnes (contaminant).   Patient became more responsive with treatment.  Was able to transition off mechanical ventilation to trach collar on 1/1.  Weaned off pressors on 12/30.  The plan is to discharge the patient to LTAC.  He was transferred to the hospitalist service pending LTAC transfer.   Assessment and Plan:  Septic shock, present on admission Due to Proteus bacteremia, pneumonia. Patient is s/p ICU stay and required pressors from 12/28 to 12/20. Sepsis has resolved.  Proteus bacteremia. Likely in the setting of recurrent UTIs with history of nephrolithiasis. -Continue meropenem  till 01/01/2025.  Pneumonia Presented with septic shock, acute on chronic respiratory failure, CT findings suggestive of pneumonia. Patient has been treated with antibiotics. - Continue meropenem  till 01/01/2025 as indicated above.   Multiple decubitus ulcers with concern for infection Patient has multiple decubitus ulcers involving trochanters, ischium, heel,  sacrum Stage III ulcer left extremity, stage IV ulcer on sacrum. Wound care team was consulted. - Will continue with wound care recs.  Acute on chronic respiratory failure Patient has chronic trach. Required mechanical ventilation on presentation. Transitioned to trach collar on 1/1. - Continue oxygen by trach mask.  T2DM Presented in DKA. A1c was 7.7 on 10/13/2024. Blood sugars now well-controlled. - Continue current regimen.  Tube feed dependent with G-tube - Continue tube feeds, free water  flushes  Hypothyroidism - Continue home Synthroid , Cytomel        Subjective: Patient not responding to questions  Physical Exam: BP (!) 115/52 (BP Location: Left Arm)   Pulse 72   Temp 98.1 F (36.7 C) (Oral)   Resp 18   Ht 6' (1.829 m)   Wt 87.4 kg   SpO2 94%   BMI 26.13 kg/m     General: Awake but with distant gaze, not responding verbally Eyes: Pupils equal, reactive  Oral cavity: moist mucous membranes  Head: Atraumatic, normocephalic  Neck: Trach + Chest: diminished breath sounds CVS: S1,S2 RRR. No murmurs  Abd: No distention, soft, non-tender. No masses palpable  Extr: Pedal edema     Data Reviewed:    Latest Ref Rng & Units 01/01/2025    5:02 AM 12/31/2024    2:29 AM 12/30/2024    3:58 AM  CBC  WBC 4.0 - 10.5 K/uL 8.0  7.5  5.9   Hemoglobin 13.0 - 17.0 g/dL 8.0  8.5  8.4   Hematocrit 39.0 - 52.0 % 27.0  28.1  28.8   Platelets 150 - 400 K/uL 527  503  360       Latest Ref Rng & Units 01/01/2025  5:02 AM 12/31/2024    2:29 AM 12/30/2024    3:58 AM  BMP  Glucose 70 - 99 mg/dL 881  838  895   BUN 8 - 23 mg/dL 49  49  50   Creatinine 0.61 - 1.24 mg/dL 9.32  9.36  9.36   Sodium 135 - 145 mmol/L 141  145  147   Potassium 3.5 - 5.1 mmol/L 4.6  4.2  4.0   Chloride 98 - 111 mmol/L 108  112  114   CO2 22 - 32 mmol/L 22  24  22    Calcium 8.9 - 10.3 mg/dL 9.7  9.4  9.4      Family Communication: n/a  Disposition: Status is: Inpatient Remains inpatient  appropriate because: Continue on IV antibiotics  DVT ppx: SQ heparin       Author: MDALA-GAUSI, Joshua Axtman AGATHA, MD 01/01/2025 2:12 PM  For on call review [email or web address].    "

## 2025-01-02 DIAGNOSIS — R6521 Severe sepsis with septic shock: Secondary | ICD-10-CM | POA: Diagnosis not present

## 2025-01-02 DIAGNOSIS — A419 Sepsis, unspecified organism: Secondary | ICD-10-CM | POA: Diagnosis not present

## 2025-01-02 LAB — BASIC METABOLIC PANEL WITH GFR
Anion gap: 10 (ref 5–15)
BUN: 43 mg/dL — ABNORMAL HIGH (ref 8–23)
CO2: 24 mmol/L (ref 22–32)
Calcium: 9.9 mg/dL (ref 8.9–10.3)
Chloride: 104 mmol/L (ref 98–111)
Creatinine, Ser: 0.66 mg/dL (ref 0.61–1.24)
GFR, Estimated: >60 mL/min
Glucose, Bld: 124 mg/dL — ABNORMAL HIGH (ref 70–99)
Potassium: 4.7 mmol/L (ref 3.5–5.1)
Sodium: 138 mmol/L (ref 135–145)

## 2025-01-02 LAB — CBC
HCT: 28.1 % — ABNORMAL LOW (ref 39.0–52.0)
Hemoglobin: 8.5 g/dL — ABNORMAL LOW (ref 13.0–17.0)
MCH: 27.5 pg (ref 26.0–34.0)
MCHC: 30.2 g/dL (ref 30.0–36.0)
MCV: 90.9 fL (ref 80.0–100.0)
Platelets: 627 K/uL — ABNORMAL HIGH (ref 150–400)
RBC: 3.09 MIL/uL — ABNORMAL LOW (ref 4.22–5.81)
RDW: 18.7 % — ABNORMAL HIGH (ref 11.5–15.5)
WBC: 9.3 K/uL (ref 4.0–10.5)
nRBC: 0 % (ref 0.0–0.2)

## 2025-01-02 LAB — GLUCOSE, CAPILLARY
Glucose-Capillary: 124 mg/dL — ABNORMAL HIGH (ref 70–99)
Glucose-Capillary: 126 mg/dL — ABNORMAL HIGH (ref 70–99)
Glucose-Capillary: 95 mg/dL (ref 70–99)

## 2025-01-02 NOTE — TOC Transition Note (Signed)
 Transition of Care [facility]) - Discharge Note   Patient Details  Name: Joshua Haley MRN: [id number] Date of Birth: September 21, 1951  Transition of Care [facility]) CM/SW Contact:  Sherline Clack, LCSWA Phone Number: 01/02/2025, 11:01 AM   Clinical Narrative:     Patient will DC to:  [facility] Anticipated DC date: 01/02/2025  Family notified: [place] Transport by: [place]   Per MD patient ready for DC to [facility]. RN to call report prior to discharge [phone], room 207). RN, patient, patient's family, and facility notified of DC. Discharge Summary and FL2 sent to facility. DC packet on chart. Ambulance transport requested for patient.   CSW will sign off for now as social work intervention is no longer needed. Please consult us  again if new needs arise.    Final next level of care: Long Term Nursing Home Barriers to Discharge: Barriers Resolved   Patient Goals and CMS Choice            Discharge Placement              Patient chooses bed at: [facility] Patient to be transferred to facility by: [facility] Name of family member notified: Jermone Geister; Spouse; [phone] Patient and family notified of of transfer: 01/02/25  Discharge Plan and Services Additional resources added to the After Visit Summary for   In-house Referral: Clinical Social Work   Post Acute Care Choice: Nursing Home                               Social Drivers of Health (SDOH) Interventions SDOH Screenings   Food Insecurity: Patient Unable To Answer (01/01/2025)  Housing: Unknown (01/01/2025)  Transportation Needs: Patient Unable To Answer (01/01/2025)  Utilities: Patient Unable To Answer (01/01/2025)  Social Connections: Patient Unable To Answer (01/01/2025)  Tobacco Use: Medium Risk (12/24/2024)     Readmission Risk Interventions    10/16/2024    4:35 PM  Readmission Risk Prevention Plan  Transportation Screening Complete  PCP or  Specialist Appt within 5-7 Days Complete  Home Care Screening Complete  Medication Review (RN CM) Complete

## 2025-01-02 NOTE — Progress Notes (Signed)
 Report given to RN at facility. [facility] called.

## 2025-01-02 NOTE — Discharge Summary (Signed)
 " Physician Discharge Summary   Patient: Joshua Haley MRN: [id number] DOB: 10-01-1951  Admit date:     12/24/2024  Discharge date: 01/02/2025  Discharge Physician: MDALA-GAUSI, GOLDEN PILLOW   PCP: Fleeta Valeria Mayo, MD   Recommendations at discharge:    Continue tube feeds, wound care  Discharge Diagnoses: Principal Problem:   Septic shock (HCC) Active Problems:   T2DM (type 2 diabetes mellitus) (HCC)   Hypothyroidism   GERD (gastroesophageal reflux disease)   HTN (hypertension)   Seizure disorder (HCC)   DKA (diabetic ketoacidosis) (HCC)  Resolved Problems:   * No resolved hospital problems. 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     He was admitted to from 12/8 to 12/12 with sepsis due to Pneumonia and UTI and he was treated with Meropenem . Urine cultures during that admission grew MDR Klebsiella and E. Coli.   He now presented again on 12/28 with fevers, hypotension requiring Levophed  and Vasopressin .  He was started on broad-spectrum antibiotics, mechanical ventilation and was admitted to the ICU.  Diagnosed with DKA and started on DKA protocol. Wound care was consulted due to multiple decubitus ulcers. Blood cultures grew Proteus mirabilis and cutibacterium acnes (contaminant).   Patient became more responsive with treatment.  Was able to transition off mechanical ventilation to trach collar on 1/1.  Weaned off pressors on 12/30 and transferred to telemetry floor on 12/31/2024.  The rest of the hospital course is in problem-based format below:      Assessment and Plan:  Septic shock, present on admission Due to Proteus bacteremia, pneumonia. Patient is s/p ICU stay and required pressors from 12/28 to 12/20. Sepsis has resolved.   Proteus  bacteremia. Likely in the setting of recurrent UTIs with history of nephrolithiasis. Treated with meropenem  from 12/24/24 to 01/01/2025.   Pneumonia Presented with septic shock, acute on chronic respiratory failure, CT findings suggestive of pneumonia. Patient treated with vancomycin  from 12/24/24 to 12/29/2024 and meropenem  from 12/24/24 to 01/01/2025.  Multiple decubitus ulcers with concern for infection Patient has multiple decubitus ulcers involving trochanters, ischium, heel, sacrum Stage III ulcer left extremity, stage IV ulcer on sacrum. Wound care team was consulted and wound care given as indicated below.    Acute on chronic respiratory failure Patient has chronic trach. Required mechanical ventilation on presentation. Transitioned to trach mask on 1/1. Patient to continue with oxygen per trach mask.    T2DM Presented in DKA. A1c was 7.7 on 10/13/2024. Blood sugars now well-controlled. Continue current regimen.   Tube feed dependent with G-tube Continued NPO status,  tube feeds, free water  flushes   Hypothyroidism Continue dhome Synthroid , Cytomel        Consultants: Critical care team Procedures performed: n/a  Disposition: LTAC Diet recommendation:  NPO with tube feeds   DISCHARGE MEDICATION: Allergies as of 01/02/2025       Reactions   Morphine And Codeine Other (See Comments)   Not specified on MAR    Penicillins Other (See Comments)   Cefepime  Other (See Comments)   Unknown reaction per MAR   Statins Nausea And Vomiting        Medication List     TAKE these medications    amantadine  50 MG/5ML solution Commonly known as: SYMMETREL  Place 100 mg into feeding tube daily. The timing  of this medication is very important.   acetaminophen  325 MG tablet Commonly known as: TYLENOL  Place 650 mg into feeding tube every 6 (six) hours as needed for mild pain (pain score 1-3) or fever.   artificial tears ophthalmic solution Place 2 drops into both eyes as  needed for dry eyes. What changed: when to take this   ascorbic acid  500 MG tablet Commonly known as: VITAMIN C  Place 500 mg into feeding tube daily. Give 500mg  per tube daily   cetirizine 10 MG chewable tablet Commonly known as: ZYRTEC Place 10 mg into feeding tube daily.   Citrucel 500 MG Tabs Generic drug: Methylcellulose (Laxative) Place 1 tablet (500 mg total) into feeding tube 3 (three) times daily as needed (Constipation).   DEX4 FAST ACTING GLUCOSE PO Take 15 g by mouth as needed (for hypoglycemia).   docusate sodium  100 MG capsule Commonly known as: COLACE Take 1 capsule (100 mg total) by mouth 2 (two) times daily as needed for mild constipation.   enoxaparin  40 MG/0.4ML injection Commonly known as: LOVENOX  Inject 40 mg into the skin daily.   feeding supplement (GLUCERNA 1.5 CAL) Liqd Place 60 mL/hr into feeding tube See admin instructions. Give 53mL/hr per tube by shift   ferrous sulfate  300 (60 Fe) MG/5ML syrup Place 300 mg into feeding tube 2 (two) times daily.   Fiasp  FlexTouch 100 UNIT/ML FlexTouch Pen Generic drug: insulin  aspart Inject 0-15 Units into the skin every 6 (six) hours. Sliding scale insulin :  70-120= 0 units, 1 21-150= 2 units,  151-200= 3 units,  201-250= 5 units,  251-300= 8 units,  301-350= 11 units,  351-400= 15 units   furosemide  20 MG tablet Commonly known as: LASIX  Place 1 tablet (20 mg total) into feeding tube daily.   gatifloxacin  0.5 % Soln Commonly known as: ZYMAXID  Place 1 drop into both eyes 4 (four) times daily.   glucagon Emergency 1 MG Solr Inject 1 mg into the muscle as needed (for hypoglycemia).   Gvoke HypoPen 1-Pack 1 MG/0.2ML Soaj Generic drug: Glucagon Inject 1 mg into the skin as needed (for hypoglycemia).   insulin  glargine-yfgn 100 UNIT/ML Pen Commonly known as: SEMGLEE  Inject 15 Units into the skin at bedtime. What changed: how much to take   lansoprazole 30 MG disintegrating tablet Commonly known  as: PREVACID SOLUTAB Place 30 mg into feeding tube in the morning.   levETIRAcetam  500 MG tablet Commonly known as: KEPPRA  Place 1 tablet (500 mg total) into feeding tube 2 (two) times daily.   levothyroxine  100 MCG tablet Commonly known as: SYNTHROID  Place 100 mcg into feeding tube daily. Give 100mcg per tube daily   liothyronine  5 MCG tablet Commonly known as: CYTOMEL  Place 7.5 mcg into feeding tube every 12 (twelve) hours. Give one and one half tablet (7.5mg ) per tube every 12 hours   midodrine  10 MG tablet Commonly known as: PROAMATINE  Place 1 tablet (10 mg total) into feeding tube 3 (three) times daily with meals.   modafinil  100 MG tablet Commonly known as: PROVIGIL  GIVE 1 TAB VIA TUBE ONCE DAILY   Nexletol 180 MG Tabs Generic drug: Bempedoic Acid Place 180 mg into feeding tube in the morning.   omega-3 acid ethyl esters 1 g capsule Commonly known as: LOVAZA  Place 1 g into feeding tube daily. Give 1 g per tube daily   ondansetron  4 MG disintegrating tablet Commonly known as: ZOFRAN -ODT Take 4 mg by mouth every 6 (six) hours as needed for nausea or vomiting (PER  TUBE). Per tube   polyethylene glycol 17 g packet Commonly known as: MIRALAX  / GLYCOLAX  Give 17g per tube daily as needed for constipation   sertraline  25 MG tablet Commonly known as: ZOLOFT  Place 25 mg into feeding tube daily. Give 25mg  per tube daily   sodium chloride  irrigation 0.9 % irrigation Irrigate with 60 mLs as directed every 8 (eight) hours. Irrigate indwelling foley with 60mL sterile saline every 8 hours.               Discharge Care Instructions  (From admission, onward)           Start     Ordered   01/02/25 0000  Discharge wound care:       Comments: Start     Ordered    12/27/24 0500    Wound care  Daily      Comments: 1.        L trochanter;  cleanse with Vashe Soila # [phone]), pat dry.  Pack with Dakins soaked  kerlix gauze using a cotton tipped applicator to advance  into wound bed.  Cover with dry gauze and tape. 2.L heel: offload at all times; cover with single layer of xeroform, top with foam. Change every other day. 3.         Sacral: Cleanse with NS, pat dry.  Pack wound with Saline soaked gauze and cover with silicone foam dressing.  Change daily. 4. Left ischium: cleanse with saline, pat dry, pack with saline moist gauze and cover with dry dressing. Change daily  12/26/24 0836   12/25/24 1049    Wound care  Every other day    Comments: Right ischium; top with single layer of xeroform and foam; change every other day; lift to inspect for acute changes q shift Right trochanter; cleanse with saline, apply single layer of xeroform gauze and top with foam. Change every other day, lift to inspect for acute changes daily   01/02/25 0940   12/29/24 0000  Discharge wound care:       Comments: Per [facility]   12/29/24 1729            Discharge Exam: Filed Weights   12/31/24 0500 01/01/25 0331 01/02/25 0427  Weight: 81.1 kg 87.4 kg 85.5 kg   General: Awake but with distant gaze, not responding verbally Eyes: Pupils equal, reactive  Oral cavity: moist mucous membranes  Head: Atraumatic, normocephalic  Neck: Trach + Chest: diminished breath sounds CVS: S1,S2 RRR. No murmurs  Abd: No distention, soft, non-tender. No masses palpable. PEG + Extr: Pedal edema    Condition at discharge: stable  The results of significant diagnostics from this hospitalization (including imaging, microbiology, ancillary and laboratory) are listed below for reference.   Imaging Studies: DG CHEST PORT 1 VIEW Result Date: 12/30/2024 CLINICAL DATA:  Shortness of breath EXAM: PORTABLE CHEST 1 VIEW COMPARISON:  December 24, 2024 FINDINGS: Stable cardiomediastinal silhouette. Sternotomy wires are noted. Tracheostomy tube is unchanged. Hypoinflation of the lungs is noted. Increased bibasilar interstitial opacities are noted concerning for edema or possibly atelectasis. Minimal  pleural effusions may be present. Bony thorax is unremarkable. IMPRESSION: Hypoinflation of the lungs with increased bibasilar interstitial opacities concerning for edema or possibly atelectasis. Minimal pleural effusions may be present. Electronically Signed   By: Lynwood Landy Raddle M.D.   On: 12/30/2024 17:56   CT CHEST ABDOMEN PELVIS WO CONTRAST Result Date: 12/25/2024 EXAM: CT CHEST, ABDOMEN AND PELVIS WITHOUT CONTRAST 12/25/2024 07:49:10 AM TECHNIQUE: CT of  the chest, abdomen and pelvis was performed without the administration of intravenous contrast. Multiplanar reformatted images are provided for review. Automated exposure control, iterative reconstruction, and/or weight based adjustment of the mA/kV was utilized to reduce the radiation dose to as low as reasonably achievable. COMPARISON: 12/03/2024 status post median sternotomy and catheter procedure. CLINICAL HISTORY: Sepsis. FINDINGS: CHEST: MEDIASTINUM AND LYMPH NODES: Heart and pericardium are unremarkable. Tracheostomy tube is in place with tip above the carina. The central airways are clear. No mediastinal, hilar or axillary lymphadenopathy. Aortic atherosclerotic calcification. LUNGS AND PLEURA: Bilateral lower lobe airspace consolidation is identified, right greater than left. Mild peripheral patchy airspace densities noted in the posterior and lateral right lower lobe. No pleural effusion or pneumothorax. ABDOMEN AND PELVIS: LIVER: The liver is unremarkable. GALLBLADDER AND BILE DUCTS: Status post cholecystectomy. No biliary ductal dilatation. SPLEEN: No acute abnormality. PANCREAS: No acute abnormality. ADRENAL GLANDS: No acute abnormality. KIDNEYS, URETERS AND BLADDER: Large bilateral renal pelvic stones, unchanged from the previous exam. The stone within the left renal pelvis measures 2.1 x 1.0 cm. The right renal pelvic stone measures 1.4 x 0.9 cm. No ureteral calculi or signs of hydronephrosis. No perinephric or periureteral stranding.  Thick-walled urinary bladder is decompressed around a foley catheter. Numerous stones are identified within the bladder, unchanged. The largest measures 2.3 x 0.8 cm. GI AND BOWEL: Stomach demonstrates no acute abnormality. Mild wall thickening of the distal rectum. The appendix is visualized and normal in caliber, without wall thickening, periappendiceal inflammation, or fluid. There is no bowel obstruction. REPRODUCTIVE ORGANS: The prostate gland is unremarkable. PERITONEUM AND RETROPERITONEUM: Trace fluid within the abdomen along bilateral pericolic gutters. No focal fluid collection. No signs of pneumoperitoneum. VASCULATURE: Aorta is normal in caliber. Aortic atherosclerotic calcification. ABDOMINAL AND PELVIS LYMPH NODES: No lymphadenopathy. BONES AND SOFT TISSUES: No acute osseous findings. Unchanged mild endplate compression deformity at T11. Decubitus ulceration is noted overlying the lower sacrum as noted previously which extends up to the bone, unchanged from prior exam without associated fluid collection. Decubitus ulceration over the posterior left hip is also again seen with soft tissue gas extending up to the greater trochanter, unchanged from previous exam. IMPRESSION: 1. Bilateral lower lobe airspace consolidation, right greater than left, with mild peripheral patchy airspace densities in the posterior and lateral right lower lobe. Imaging findings are compatible with multifocal pneumonia and/or aspiration. 2. Decubitus ulceration overlying the lower sacrum and over the posterior left hip with soft tissue gas extending to the greater trochanter, unchanged from prior, without associated fluid collection. No signs of acute osteomyelitis. 3. Mild wall thickening of the distal rectum. 4. Unchanged appearance of multiple, bilateral renal calculi including large pelvic stones, unchanged. No signs of obstructive uropathy. Numerous bladder stones as before. Electronically signed by: Waddell Calk MD  12/25/2024 08:03 AM EST RP Workstation: [id number]   DG Chest Port 1 View Result Date: 12/24/2024 EXAM: 1 VIEW(S) XRAY OF THE CHEST 12/24/2024 08:18:00 PM COMPARISON: 12/03/2024 CLINICAL HISTORY: Questionable sepsis - evaluate for abnormality FINDINGS: LINES, TUBES AND DEVICES: Tracheostomy tube in place. LUNGS AND PLEURA: Increased airspace opacity in RIGHT lower lung zone. Patchy airspace opacity in LEFT lung base. No pleural effusion. No pneumothorax. HEART AND MEDIASTINUM: Coronary artery calcifications and CABG changes noted. BONES AND SOFT TISSUES: Sternotomy wires noted. No acute osseous abnormality. IMPRESSION: 1. Increased airspace opacity in the right lower lung zone and patchy airspace opacity in the left lung base, concerning for pneumonia. 2. Tracheostomy tube in place. Electronically signed by:  Kate Plummer MD 12/24/2024 08:57 PM EST RP Workstation: [id number]   CT CHEST ABDOMEN PELVIS W CONTRAST Result Date: 12/03/2024 EXAM: CT CHEST, ABDOMEN AND PELVIS WITH CONTRAST 12/03/2024 11:12:53 PM TECHNIQUE: CT of the chest, abdomen and pelvis was performed with the administration of intravenous contrast. Multiplanar reformatted images are provided for review. Automated exposure control, iterative reconstruction, and/or weight based adjustment of the mA/kV was utilized to reduce the radiation dose to as low as reasonably achievable. COMPARISON: 10/13/2024 CLINICAL HISTORY: Sepsis FINDINGS: CHEST: MEDIASTINUM AND LYMPH NODES: Tracheostomy in the mid trachea, unchanged. 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 Heart and pericardium are otherwise unremarkable. The central airways are clear. No mediastinal, hilar or axillary lymphadenopathy. LUNGS AND PLEURA: Consolidation in the right lower lobe compatible with pneumonia. Dependent atelectasis in the left lower lobe. No pleural effusion or pneumothorax. ABDOMEN AND PELVIS: LIVER: The liver is unremarkable. GALLBLADDER AND  BILE DUCTS: Prior cholecystectomy. No biliary ductal dilatation. SPLEEN: No acute abnormality. PANCREAS: No acute abnormality. ADRENAL GLANDS: No acute abnormality. KIDNEYS, URETERS AND BLADDER: Multiple bilateral renal stones including large bilateral renal pelvic stones are unchanged. No ureteral stones or hydronephrosis. No perinephric or periureteral stranding. Foley catheter in the bladder, which is decompressed. Numerous bladder stones are unchanged. GI AND BOWEL: Gastrostomy tube in the stomach. Large stool burden in the rectum, question fecal impaction. Colonic diverticulosis. Normal appendix. There is no bowel obstruction. REPRODUCTIVE ORGANS: No acute abnormality. PERITONEUM AND RETROPERITONEUM: No ascites. No free air. VASCULATURE: Aorta is normal in caliber. Aortoiliac atherosclerosis. ABDOMINAL AND PELVIS LYMPH NODES: No lymphadenopathy. BONES AND SOFT TISSUES: Decubitus ulcer noted in the posterior left hip region with soft tissue gas, unchanged since prior study. Probable sacral decubitus ulcer also noted and unchanged. No evidence of acute osteomyelitis. No acute osseous abnormality. IMPRESSION: 1. Right lower lobe consolidation compatible with pneumonia. 2. Large stool burden in the rectum raising concern for fecal impaction. 3. Pressure ulcer in the posterior left hip region with soft tissue gas, unchanged. Probable sacral pressure ulcer, unchanged. No evidence of acute osteomyelitis. 4. Multiple bilateral renal calculi including large pelvic stones, unchanged. No hydronephrosis. Numerous bladder stones, stable. 5. Colonic diverticulosis. No active diverticulitis. 6. Aortic atherosclerosis. Electronically signed by: Franky Crease MD 12/03/2024 11:23 PM EST RP Workstation: [id number]   DG Chest Port 1 View Result Date: 12/03/2024 EXAM: 1 VIEW(S) XRAY OF THE CHEST 12/03/2024 10:38:01 PM COMPARISON: 10/13/2024 cxr and ct CLINICAL HISTORY: Questionable sepsis - evaluate for abnormality FINDINGS:  LINES, TUBES AND DEVICES: Tracheostomy tube in place with tip 6.5 cm above the carina. LUNGS AND PLEURA: Low lung volumes with bronchovascular crowding. Bibasilar, right greater than left, patchy opacities. No pleural effusion. No pneumothorax. HEART AND MEDIASTINUM: No acute abnormality of the cardiac and mediastinal silhouettes. BONES AND SOFT TISSUES: Nondisplaced median sternotomy wires. Right upper quadrant surgical clips noted. IMPRESSION: 1. Bibasilar, right greater than left, patchy airspace opacities, which may reflect pneumonia or atelectasis.Follow-up PA and lateral chest X-ray is recommended in 3-4 weeks following therapy to ensure resolution and exclude underlying malignancy. Electronically signed by: Morgane Naveau MD 12/03/2024 10:47 PM EST RP Workstation: [id number]    Microbiology: Results for orders placed or performed during the hospital encounter of 12/24/24  Resp panel by RT-PCR (RSV, Flu A&B, Covid) Anterior Nasal Swab     Status: None   Collection Time: 12/24/24  7:55 PM   Specimen: Anterior Nasal Swab  Result Value Ref Range Status   SARS Coronavirus 2 by  RT PCR NEGATIVE NEGATIVE Final   Influenza A by PCR NEGATIVE NEGATIVE Final   Influenza B by PCR NEGATIVE NEGATIVE Final    Comment: (NOTE) The Xpert Xpress SARS-CoV-2/FLU/RSV plus assay is intended as an aid in the diagnosis of influenza from Nasopharyngeal swab specimens and should not be used as a sole basis for treatment. Nasal washings and aspirates are unacceptable for Xpert Xpress SARS-CoV-2/FLU/RSV testing.  Fact Sheet for Patients: bloggercourse.com  Fact Sheet for Healthcare Providers: seriousbroker.it  This test is not yet approved or cleared by the [place]  FDA and has been authorized for detection and/or diagnosis of SARS-CoV-2 by FDA under an Emergency Use Authorization (EUA). This EUA will remain in effect (meaning this test can be used) for the  duration of the COVID-19 declaration under Section 564(b)(1) of the Act, 21 U.S.C. section 360bbb-3(b)(1), unless the authorization is terminated or revoked.     Resp Syncytial Virus by PCR NEGATIVE NEGATIVE Final    Comment: (NOTE) Fact Sheet for Patients: bloggercourse.com  Fact Sheet for Healthcare Providers: seriousbroker.it  This test is not yet approved or cleared by the [place]  FDA and has been authorized for detection and/or diagnosis of SARS-CoV-2 by FDA under an Emergency Use Authorization (EUA). This EUA will remain in effect (meaning this test can be used) for the duration of the COVID-19 declaration under Section 564(b)(1) of the Act, 21 U.S.C. section 360bbb-3(b)(1), unless the authorization is terminated or revoked.  Performed at [facility], [address], [place], [place] [address]   Blood Culture (routine x 2)     Status: Abnormal   Collection Time: 12/24/24  7:55 PM   Specimen: BLOOD  Result Value Ref Range Status   Specimen Description BLOOD BLOOD RIGHT ARM  Final   Special Requests   Final    BOTTLES DRAWN AEROBIC AND ANAEROBIC Blood Culture adequate volume   Culture  Setup Time   Final    GRAM NEGATIVE RODS AEROBIC BOTTLE ONLY PHARMD J LEDFORD 12/26/2024 @ 0431 BY AB GRAM POSITIVE RODS ANAEROBIC BOTTLE ONLY CRITICAL RESULT CALLED TO, READ BACK BY AND VERIFIED WITH: J. FRENS PHARMD, 1221 12/29/24 D. VANHOOK    Culture (A)  Final    PROTEUS MIRABILIS CUTIBACTERIUM ACNES Standardized susceptibility testing for this organism is not available. Performed at [facility], [address] [place], [place] [address]    Report Status 12/31/2024 FINAL  Final   Organism ID, Bacteria PROTEUS MIRABILIS  Final      Susceptibility   Proteus mirabilis - MIC*    AMPICILLIN <=2 SENSITIVE Sensitive     CEFAZOLIN (NON-URINE) 4 INTERMEDIATE Intermediate     CEFEPIME  <=0.12 SENSITIVE Sensitive      ERTAPENEM <=0.12 SENSITIVE Sensitive     CEFTRIAXONE  <=0.25 SENSITIVE Sensitive     CIPROFLOXACIN <=0.06 SENSITIVE Sensitive     GENTAMICIN <=1 SENSITIVE Sensitive     MEROPENEM  1 SENSITIVE Sensitive     TRIMETH /SULFA  >=320 RESISTANT Resistant     AMPICILLIN/SULBACTAM <=2 SENSITIVE Sensitive     PIP/TAZO Value in next row Sensitive      <=4 SENSITIVEThis is a modified FDA-approved test that has been validated and its performance characteristics determined by the reporting laboratory.  This laboratory is certified under the Clinical Laboratory Improvement Amendments CLIA as qualified to perform high complexity clinical laboratory testing.    * PROTEUS MIRABILIS  Blood Culture ID Panel (Reflexed)     Status: Abnormal   Collection Time: 12/24/24  7:55  PM  Result Value Ref Range Status   Enterococcus faecalis NOT DETECTED NOT DETECTED Final   Enterococcus Faecium NOT DETECTED NOT DETECTED Final   Listeria monocytogenes NOT DETECTED NOT DETECTED Final   Staphylococcus species NOT DETECTED NOT DETECTED Final   Staphylococcus aureus (BCID) NOT DETECTED NOT DETECTED Final   Staphylococcus epidermidis NOT DETECTED NOT DETECTED Final   Staphylococcus lugdunensis NOT DETECTED NOT DETECTED Final   Streptococcus species NOT DETECTED NOT DETECTED Final   Streptococcus agalactiae NOT DETECTED NOT DETECTED Final   Streptococcus pneumoniae NOT DETECTED NOT DETECTED Final   Streptococcus pyogenes NOT DETECTED NOT DETECTED Final   A.calcoaceticus-baumannii NOT DETECTED NOT DETECTED Final   Bacteroides fragilis NOT DETECTED NOT DETECTED Final   Enterobacterales DETECTED (A) NOT DETECTED Final    Comment: Enterobacterales represent a large order of gram negative bacteria, not a single organism. CRITICAL RESULT CALLED TO, READ BACK BY AND VERIFIED WITH: PHARMD J LEDFORD 12/26/2024 @ 0431 BY AB    Enterobacter cloacae complex NOT DETECTED NOT DETECTED Final   Escherichia coli NOT DETECTED NOT DETECTED Final    Klebsiella aerogenes NOT DETECTED NOT DETECTED Final   Klebsiella oxytoca NOT DETECTED NOT DETECTED Final   Klebsiella pneumoniae NOT DETECTED NOT DETECTED Final   Proteus species DETECTED (A) NOT DETECTED Final    Comment: CRITICAL RESULT CALLED TO, READ BACK BY AND VERIFIED WITH: PHARMD J LEDFORD 12/26/2024 @ 0431 BY AB    Salmonella species NOT DETECTED NOT DETECTED Final   Serratia marcescens NOT DETECTED NOT DETECTED Final   Haemophilus influenzae NOT DETECTED NOT DETECTED Final   Neisseria meningitidis NOT DETECTED NOT DETECTED Final   Pseudomonas aeruginosa NOT DETECTED NOT DETECTED Final   Stenotrophomonas maltophilia NOT DETECTED NOT DETECTED Final   Candida albicans NOT DETECTED NOT DETECTED Final   Candida auris NOT DETECTED NOT DETECTED Final   Candida glabrata NOT DETECTED NOT DETECTED Final   Candida krusei NOT DETECTED NOT DETECTED Final   Candida parapsilosis NOT DETECTED NOT DETECTED Final   Candida tropicalis NOT DETECTED NOT DETECTED Final   Cryptococcus neoformans/gattii NOT DETECTED NOT DETECTED Final   CTX-M ESBL NOT DETECTED NOT DETECTED Final   Carbapenem resistance IMP NOT DETECTED NOT DETECTED Final   Carbapenem resistance KPC NOT DETECTED NOT DETECTED Final   Carbapenem resistance NDM NOT DETECTED NOT DETECTED Final   Carbapenem resist OXA 48 LIKE NOT DETECTED NOT DETECTED Final   Carbapenem resistance VIM NOT DETECTED NOT DETECTED Final    Comment: Performed at [facility], [address], [place], [place] [address]  MRSA Next Gen by PCR, Nasal     Status: Abnormal   Collection Time: 12/24/24 11:54 PM   Specimen: Nasal Mucosa; Nasal Swab  Result Value Ref Range Status   MRSA by PCR Next Gen DETECTED (A) NOT DETECTED Final    Comment: RESULT CALLED TO, READ BACK BY AND VERIFIED WITH: K. VILDLES RN, AT 1051 12/25/24 D. VANHOOK (NOTE) The GeneXpert MRSA Assay (FDA approved for NASAL specimens only), is one component of a comprehensive MRSA  colonization surveillance program. It is not intended to diagnose MRSA infection nor to guide or monitor treatment for MRSA infections. Test performance is not FDA approved in patients less than 76 years old. Performed at [facility], [address] [place], [place] [address]   Blood Culture (routine x 2)     Status: None   Collection Time: 12/25/24 10:07 AM   Specimen: BLOOD  Result Value  Ref Range Status   Specimen Description BLOOD BLOOD LEFT HAND  Final   Special Requests   Final    BOTTLES DRAWN AEROBIC AND ANAEROBIC Blood Culture results may not be optimal due to an inadequate volume of blood received in culture bottles   Culture   Final    NO GROWTH 5 DAYS Performed at [facility], [address] [place], [place] [address]    Report Status 12/30/2024 FINAL  Final  Culture, blood (Routine X 2) w Reflex to ID Panel     Status: None (Preliminary result)   Collection Time: 12/30/24 10:39 AM   Specimen: BLOOD LEFT ARM  Result Value Ref Range Status   Specimen Description BLOOD LEFT ARM  Final   Special Requests   Final    BOTTLES DRAWN AEROBIC ONLY Blood Culture results may not be optimal due to an inadequate volume of blood received in culture bottles   Culture   Final    NO GROWTH 3 DAYS Performed at [facility], [address], [place], [place] [address]    Report Status PENDING  Incomplete  Culture, blood (Routine X 2) w Reflex to ID Panel     Status: None (Preliminary result)   Collection Time: 12/30/24 10:39 AM   Specimen: BLOOD RIGHT HAND  Result Value Ref Range Status   Specimen Description BLOOD RIGHT HAND  Final   Special Requests   Final    BOTTLES DRAWN AEROBIC ONLY Blood Culture results may not be optimal due to an inadequate volume of blood received in culture bottles   Culture   Final    NO GROWTH 3 DAYS Performed at [facility], [address] [place], [place] [address]    Report Status PENDING  Incomplete     Labs: CBC: Recent Labs  Lab 12/29/24 0303 12/30/24 0358 12/31/24 0229 01/01/25 0502 01/02/25 0451  WBC 5.9 5.9 7.5 8.0 9.3  HGB 8.6* 8.4* 8.5* 8.0* 8.5*  HCT 29.3* 28.8* 28.1* 27.0* 28.1*  MCV 94.2 95.4 91.8 91.8 90.9  PLT 269 360 503* 527* 627*   Basic Metabolic Panel: Recent Labs  Lab 12/27/24 0327 12/28/24 0404 12/29/24 0303 12/30/24 0358 12/31/24 0229 01/01/25 0502 01/02/25 0451  NA 147* 153* 152* 147* 145 141 138  K 3.2* 3.9 3.9 4.0 4.2 4.6 4.7  CL 113* 118* 117* 114* 112* 108 104  CO2 24 25 27 22 24 22 24   GLUCOSE 232* 132* 160* 104* 161* 118* 124*  BUN 53* 47* 47* 50* 49* 49* 43*  CREATININE 0.73 0.61 0.63 0.63 0.63 0.67 0.66  CALCIUM 9.4 9.6 9.5 9.4 9.4 9.7 9.9  MG 2.1 2.1 2.1 2.2 2.2  --   --   PHOS 1.7* 2.3*  --  3.4  --   --   --    Liver Function Tests: No results for input(s): AST, ALT, ALKPHOS, BILITOT, PROT, ALBUMIN in the last 168 hours. CBG: Recent Labs  Lab 01/01/25 1705 01/01/25 2117 01/02/25 0009 01/02/25 0419 01/02/25 0831  GLUCAP 132* 89 124* 126* 95    Discharge time spent: greater than 30 minutes.  Signed: MDALA-GAUSI, Willella Harding AGATHA, MD [facility] 01/02/2025 "

## 2025-01-02 NOTE — TOC Progression Note (Signed)
 Transition of Care [facility]) - Progression Note    Patient Details  Name: EVERTON BERTHA MRN: [id number] Date of Birth: Oct 26, 1951  [facility] CM/SW Contact  Sherline Clack, [place] Phone Number: 01/02/2025, 8:25 AM  Clinical Narrative:     CSW received call from Channing Maus at [facility] regarding patient's insurance authorization; insurance and facility negotiated rates this morning and patient has been cleared to transfer. Provider made aware. Anticipated discharge for patient is today via [facility]. CSW will arrange transportation for patient and notify family.   Expected Discharge Plan: Long Term Nursing Home Barriers to Discharge: Continued Medical Work up               Expected Discharge Plan and Services In-house Referral: Clinical Social Work   Post Acute Care Choice: Nursing Home Living arrangements for the past 2 months: Skilled Nursing Facility Expected Discharge Date: 12/29/24                                     Social Drivers of Health (SDOH) Interventions SDOH Screenings   Food Insecurity: Patient Unable To Answer (01/01/2025)  Housing: Unknown (01/01/2025)  Transportation Needs: Patient Unable To Answer (01/01/2025)  Utilities: Patient Unable To Answer (01/01/2025)  Social Connections: Patient Unable To Answer (01/01/2025)  Tobacco Use: Medium Risk (12/24/2024)    Readmission Risk Interventions    10/16/2024    4:35 PM  Readmission Risk Prevention Plan  Transportation Screening Complete  PCP or Specialist Appt within 5-7 Days Complete  Home Care Screening Complete  Medication Review (RN CM) Complete

## 2025-01-02 NOTE — Plan of Care (Signed)
" °  Problem: Education: Goal: Ability to describe self-care measures that may prevent or decrease complications (Diabetes Survival Skills Education) will improve Outcome: Progressing Goal: Individualized Educational Video(s) Outcome: Progressing   Problem: Coping: Goal: Ability to adjust to condition or change in health will improve Outcome: Progressing   Problem: Fluid Volume: Goal: Ability to maintain a balanced intake and output will improve Outcome: Progressing   Problem: Health Behavior/Discharge Planning: Goal: Ability to identify and utilize available resources and services will improve Outcome: Progressing Goal: Ability to manage health-related needs will improve Outcome: Progressing   Problem: Skin Integrity: Goal: Risk for impaired skin integrity will decrease Outcome: Progressing   Problem: Cardiac: Goal: Ability to maintain an adequate cardiac output will improve Outcome: Progressing   Problem: Nutritional: Goal: Maintenance of adequate nutrition will improve Outcome: Progressing Goal: Maintenance of adequate weight for body size and type will improve Outcome: Progressing   Problem: Respiratory: Goal: Will regain and/or maintain adequate ventilation Outcome: Progressing   Problem: Coping: Goal: Level of anxiety will decrease Outcome: Progressing   Problem: Elimination: Goal: Will not experience complications related to bowel motility Outcome: Progressing Goal: Will not experience complications related to urinary retention Outcome: Progressing   "

## 2025-01-03 DIAGNOSIS — Z930 Tracheostomy status: Secondary | ICD-10-CM

## 2025-01-03 DIAGNOSIS — J9621 Acute and chronic respiratory failure with hypoxia: Secondary | ICD-10-CM

## 2025-01-03 DIAGNOSIS — J189 Pneumonia, unspecified organism: Secondary | ICD-10-CM

## 2025-01-03 DIAGNOSIS — G9340 Encephalopathy, unspecified: Secondary | ICD-10-CM

## 2025-01-04 DIAGNOSIS — J189 Pneumonia, unspecified organism: Secondary | ICD-10-CM

## 2025-01-04 DIAGNOSIS — G9340 Encephalopathy, unspecified: Secondary | ICD-10-CM

## 2025-01-04 DIAGNOSIS — J9621 Acute and chronic respiratory failure with hypoxia: Secondary | ICD-10-CM

## 2025-01-04 DIAGNOSIS — Z930 Tracheostomy status: Secondary | ICD-10-CM

## 2025-01-04 LAB — CULTURE, BLOOD (ROUTINE X 2)
Culture: NO GROWTH
Culture: NO GROWTH

## 2025-01-05 DIAGNOSIS — J9621 Acute and chronic respiratory failure with hypoxia: Secondary | ICD-10-CM

## 2025-01-05 DIAGNOSIS — J189 Pneumonia, unspecified organism: Secondary | ICD-10-CM

## 2025-01-05 DIAGNOSIS — Z930 Tracheostomy status: Secondary | ICD-10-CM

## 2025-01-05 DIAGNOSIS — G9340 Encephalopathy, unspecified: Secondary | ICD-10-CM

## 2025-01-06 DIAGNOSIS — J9621 Acute and chronic respiratory failure with hypoxia: Secondary | ICD-10-CM

## 2025-01-06 DIAGNOSIS — Z930 Tracheostomy status: Secondary | ICD-10-CM

## 2025-01-06 DIAGNOSIS — G9340 Encephalopathy, unspecified: Secondary | ICD-10-CM

## 2025-01-06 DIAGNOSIS — J189 Pneumonia, unspecified organism: Secondary | ICD-10-CM

## 2025-01-07 DIAGNOSIS — J189 Pneumonia, unspecified organism: Secondary | ICD-10-CM

## 2025-01-07 DIAGNOSIS — J9621 Acute and chronic respiratory failure with hypoxia: Secondary | ICD-10-CM

## 2025-01-07 DIAGNOSIS — G9340 Encephalopathy, unspecified: Secondary | ICD-10-CM

## 2025-01-07 DIAGNOSIS — Z930 Tracheostomy status: Secondary | ICD-10-CM

## 2025-01-15 DIAGNOSIS — J9621 Acute and chronic respiratory failure with hypoxia: Secondary | ICD-10-CM

## 2025-01-15 DIAGNOSIS — G9340 Encephalopathy, unspecified: Secondary | ICD-10-CM

## 2025-01-15 DIAGNOSIS — J189 Pneumonia, unspecified organism: Secondary | ICD-10-CM

## 2025-01-15 DIAGNOSIS — Z930 Tracheostomy status: Secondary | ICD-10-CM

## 2025-01-16 DIAGNOSIS — J189 Pneumonia, unspecified organism: Secondary | ICD-10-CM

## 2025-01-16 DIAGNOSIS — G9340 Encephalopathy, unspecified: Secondary | ICD-10-CM

## 2025-01-16 DIAGNOSIS — J9621 Acute and chronic respiratory failure with hypoxia: Secondary | ICD-10-CM

## 2025-01-16 DIAGNOSIS — Z930 Tracheostomy status: Secondary | ICD-10-CM

## 2025-01-17 DIAGNOSIS — G9340 Encephalopathy, unspecified: Secondary | ICD-10-CM

## 2025-01-17 DIAGNOSIS — Z930 Tracheostomy status: Secondary | ICD-10-CM

## 2025-01-17 DIAGNOSIS — J9621 Acute and chronic respiratory failure with hypoxia: Secondary | ICD-10-CM

## 2025-01-17 DIAGNOSIS — J189 Pneumonia, unspecified organism: Secondary | ICD-10-CM

## 2025-01-18 DIAGNOSIS — J189 Pneumonia, unspecified organism: Secondary | ICD-10-CM

## 2025-01-18 DIAGNOSIS — J9621 Acute and chronic respiratory failure with hypoxia: Secondary | ICD-10-CM

## 2025-01-18 DIAGNOSIS — Z930 Tracheostomy status: Secondary | ICD-10-CM

## 2025-01-18 DIAGNOSIS — G9340 Encephalopathy, unspecified: Secondary | ICD-10-CM

## 2025-01-19 DIAGNOSIS — J9621 Acute and chronic respiratory failure with hypoxia: Secondary | ICD-10-CM

## 2025-01-19 DIAGNOSIS — J189 Pneumonia, unspecified organism: Secondary | ICD-10-CM

## 2025-01-19 DIAGNOSIS — Z930 Tracheostomy status: Secondary | ICD-10-CM

## 2025-01-19 DIAGNOSIS — G9340 Encephalopathy, unspecified: Secondary | ICD-10-CM

## 2025-01-20 DIAGNOSIS — J189 Pneumonia, unspecified organism: Secondary | ICD-10-CM

## 2025-01-20 DIAGNOSIS — Z930 Tracheostomy status: Secondary | ICD-10-CM

## 2025-01-20 DIAGNOSIS — J9621 Acute and chronic respiratory failure with hypoxia: Secondary | ICD-10-CM

## 2025-01-20 DIAGNOSIS — G9340 Encephalopathy, unspecified: Secondary | ICD-10-CM

## 2025-01-21 DIAGNOSIS — J189 Pneumonia, unspecified organism: Secondary | ICD-10-CM

## 2025-01-21 DIAGNOSIS — G9340 Encephalopathy, unspecified: Secondary | ICD-10-CM

## 2025-01-21 DIAGNOSIS — J9621 Acute and chronic respiratory failure with hypoxia: Secondary | ICD-10-CM

## 2025-01-21 DIAGNOSIS — Z930 Tracheostomy status: Secondary | ICD-10-CM

## 2025-02-02 ENCOUNTER — Emergency Department (HOSPITAL_COMMUNITY)

## 2025-02-02 ENCOUNTER — Inpatient Hospital Stay (HOSPITAL_COMMUNITY)

## 2025-02-02 ENCOUNTER — Inpatient Hospital Stay (HOSPITAL_COMMUNITY)
Admission: EM | Admit: 2025-02-02 | Source: Home / Self Care | Attending: Critical Care Medicine | Admitting: Critical Care Medicine

## 2025-02-02 DIAGNOSIS — N179 Acute kidney failure, unspecified: Secondary | ICD-10-CM

## 2025-02-02 DIAGNOSIS — A419 Sepsis, unspecified organism: Secondary | ICD-10-CM | POA: Diagnosis present

## 2025-02-02 DIAGNOSIS — R7989 Other specified abnormal findings of blood chemistry: Secondary | ICD-10-CM

## 2025-02-02 DIAGNOSIS — R579 Shock, unspecified: Principal | ICD-10-CM

## 2025-02-02 LAB — COMPREHENSIVE METABOLIC PANEL WITH GFR
ALT: 16 U/L (ref 0–44)
AST: 23 U/L (ref 15–41)
Albumin: 2.9 g/dL — ABNORMAL LOW (ref 3.5–5.0)
Alkaline Phosphatase: 257 U/L — ABNORMAL HIGH (ref 38–126)
Anion gap: 19 — ABNORMAL HIGH (ref 5–15)
BUN: 79 mg/dL — ABNORMAL HIGH (ref 8–23)
CO2: 20 mmol/L — ABNORMAL LOW (ref 22–32)
Calcium: 9.8 mg/dL (ref 8.9–10.3)
Chloride: 93 mmol/L — ABNORMAL LOW (ref 98–111)
Creatinine, Ser: 1.73 mg/dL — ABNORMAL HIGH (ref 0.61–1.24)
GFR, Estimated: 41 mL/min — ABNORMAL LOW
Glucose, Bld: 429 mg/dL — ABNORMAL HIGH (ref 70–99)
Potassium: 4.6 mmol/L (ref 3.5–5.1)
Sodium: 132 mmol/L — ABNORMAL LOW (ref 135–145)
Total Bilirubin: 0.6 mg/dL (ref 0.0–1.2)
Total Protein: 8.9 g/dL — ABNORMAL HIGH (ref 6.5–8.1)

## 2025-02-02 LAB — BLOOD CULTURE ID PANEL (REFLEXED) - BCID2

## 2025-02-02 LAB — URINALYSIS, W/ REFLEX TO CULTURE (INFECTION SUSPECTED)
Glucose, UA: NEGATIVE mg/dL
Hgb urine dipstick: NEGATIVE
Ketones, ur: NEGATIVE mg/dL
Nitrite: NEGATIVE
Protein, ur: >=300 mg/dL — AB
Specific Gravity, Urine: 1.015 (ref 1.005–1.030)
WBC, UA: >50 WBC/hpf (ref 0–5)
pH: 9 — ABNORMAL HIGH (ref 5.0–8.0)

## 2025-02-02 LAB — GLUCOSE, CAPILLARY
Glucose-Capillary: 146 mg/dL — ABNORMAL HIGH (ref 70–99)
Glucose-Capillary: 147 mg/dL — ABNORMAL HIGH (ref 70–99)
Glucose-Capillary: 189 mg/dL — ABNORMAL HIGH (ref 70–99)
Glucose-Capillary: 200 mg/dL — ABNORMAL HIGH (ref 70–99)
Glucose-Capillary: 219 mg/dL — ABNORMAL HIGH (ref 70–99)
Glucose-Capillary: 250 mg/dL — ABNORMAL HIGH (ref 70–99)
Glucose-Capillary: 251 mg/dL — ABNORMAL HIGH (ref 70–99)
Glucose-Capillary: 256 mg/dL — ABNORMAL HIGH (ref 70–99)
Glucose-Capillary: 292 mg/dL — ABNORMAL HIGH (ref 70–99)
Glucose-Capillary: 304 mg/dL — ABNORMAL HIGH (ref 70–99)

## 2025-02-02 LAB — CBC WITH DIFFERENTIAL/PLATELET
Abs Immature Granulocytes: 0.25 10*3/uL — ABNORMAL HIGH (ref 0.00–0.07)
Basophils Absolute: 0.1 10*3/uL (ref 0.0–0.1)
Basophils Relative: 0 %
Eosinophils Absolute: 0 10*3/uL (ref 0.0–0.5)
Eosinophils Relative: 0 %
HCT: 36.6 % — ABNORMAL LOW (ref 39.0–52.0)
Hemoglobin: 11.1 g/dL — ABNORMAL LOW (ref 13.0–17.0)
Immature Granulocytes: 1 %
Lymphocytes Relative: 2 %
Lymphs Abs: 0.3 10*3/uL — ABNORMAL LOW (ref 0.7–4.0)
MCH: 26.7 pg (ref 26.0–34.0)
MCHC: 30.3 g/dL (ref 30.0–36.0)
MCV: 88.2 fL (ref 80.0–100.0)
Monocytes Absolute: 0.7 10*3/uL (ref 0.1–1.0)
Monocytes Relative: 3 %
Neutro Abs: 19.3 10*3/uL — ABNORMAL HIGH (ref 1.7–7.7)
Neutrophils Relative %: 94 %
Platelets: 635 10*3/uL — ABNORMAL HIGH (ref 150–400)
RBC: 4.15 MIL/uL — ABNORMAL LOW (ref 4.22–5.81)
RDW: 19.8 % — ABNORMAL HIGH (ref 11.5–15.5)
WBC: 20.7 10*3/uL — ABNORMAL HIGH (ref 4.0–10.5)
nRBC: 0.1 % (ref 0.0–0.2)

## 2025-02-02 LAB — RENAL FUNCTION PANEL
Albumin: 2.7 g/dL — ABNORMAL LOW (ref 3.5–5.0)
Anion gap: 16 — ABNORMAL HIGH (ref 5–15)
BUN: 78 mg/dL — ABNORMAL HIGH (ref 8–23)
CO2: 23 mmol/L (ref 22–32)
Calcium: 9 mg/dL (ref 8.9–10.3)
Chloride: 94 mmol/L — ABNORMAL LOW (ref 98–111)
Creatinine, Ser: 1.55 mg/dL — ABNORMAL HIGH (ref 0.61–1.24)
GFR, Estimated: 47 mL/min — ABNORMAL LOW
Glucose, Bld: 185 mg/dL — ABNORMAL HIGH (ref 70–99)
Phosphorus: 2 mg/dL — ABNORMAL LOW (ref 2.5–4.6)
Potassium: 4.6 mmol/L (ref 3.5–5.1)
Sodium: 133 mmol/L — ABNORMAL LOW (ref 135–145)

## 2025-02-02 LAB — HEMOGLOBIN A1C
Hgb A1c MFr Bld: 6.8 % — ABNORMAL HIGH (ref 4.8–5.6)
Mean Plasma Glucose: 148.46 mg/dL

## 2025-02-02 LAB — LACTIC ACID, PLASMA
Lactic Acid, Venous: 3.5 mmol/L (ref 0.5–1.9)
Lactic Acid, Venous: 2.2 mmol/L (ref 0.5–1.9)

## 2025-02-02 LAB — URINALYSIS, ROUTINE W REFLEX MICROSCOPIC
Bilirubin Urine: NEGATIVE
Glucose, UA: NEGATIVE mg/dL
Ketones, ur: NEGATIVE mg/dL
Nitrite: NEGATIVE
Protein, ur: 30 mg/dL — AB
Specific Gravity, Urine: 1.01 (ref 1.005–1.030)
pH: 7 (ref 5.0–8.0)

## 2025-02-02 LAB — RESP PANEL BY RT-PCR (RSV, FLU A&B, COVID)  RVPGX2
Influenza A by PCR: NEGATIVE
Influenza B by PCR: NEGATIVE
Resp Syncytial Virus by PCR: NEGATIVE
SARS Coronavirus 2 by RT PCR: NEGATIVE

## 2025-02-02 LAB — I-STAT VENOUS BLOOD GAS, ED
Acid-Base Excess: 0 mmol/L (ref 0.0–2.0)
Bicarbonate: 23.2 mmol/L (ref 20.0–28.0)
Calcium, Ion: 1.15 mmol/L (ref 1.15–1.40)
HCT: 38 % — ABNORMAL LOW (ref 39.0–52.0)
Hemoglobin: 12.9 g/dL — ABNORMAL LOW (ref 13.0–17.0)
O2 Saturation: 77 %
Potassium: 4.5 mmol/L (ref 3.5–5.1)
Sodium: 136 mmol/L (ref 135–145)
TCO2: 24 mmol/L (ref 22–32)
pCO2, Ven: 32.8 mmHg — ABNORMAL LOW (ref 44–60)
pH, Ven: 7.458 — ABNORMAL HIGH (ref 7.25–7.43)
pO2, Ven: 39 mmHg (ref 32–45)

## 2025-02-02 LAB — CULTURE, BLOOD (ROUTINE X 2)
Special Requests: ADEQUATE
Special Requests: ADEQUATE

## 2025-02-02 LAB — I-STAT CG4 LACTIC ACID, ED
Lactic Acid, Venous: 6.8 mmol/L (ref 0.5–1.9)
Lactic Acid, Venous: 5.9 mmol/L (ref 0.5–1.9)

## 2025-02-02 LAB — ECHOCARDIOGRAM COMPLETE
Calc EF: 35.7 %
Height: 72 in
S' Lateral: 2.8 cm
Single Plane A2C EF: 33.5 %
Single Plane A4C EF: 36.9 %

## 2025-02-02 LAB — TROPONIN T, HIGH SENSITIVITY
Troponin T High Sensitivity: 104 ng/L (ref 0–19)
Troponin T High Sensitivity: 106 ng/L (ref 0–19)
Troponin T High Sensitivity: 81 ng/L — ABNORMAL HIGH (ref 0–19)

## 2025-02-02 LAB — BETA-HYDROXYBUTYRIC ACID: Beta-Hydroxybutyric Acid: 0.27 mmol/L (ref 0.05–0.27)

## 2025-02-02 LAB — TSH: TSH: 6.36 u[IU]/mL — ABNORMAL HIGH (ref 0.350–4.500)

## 2025-02-02 LAB — CORTISOL: Cortisol, Plasma: 42.5 ug/dL

## 2025-02-02 LAB — CBG MONITORING, ED
Glucose-Capillary: 403 mg/dL — ABNORMAL HIGH (ref 70–99)
Glucose-Capillary: 346 mg/dL — ABNORMAL HIGH (ref 70–99)

## 2025-02-02 LAB — PROTIME-INR
INR: 1.1 (ref 0.8–1.2)
Prothrombin Time: 14.8 s (ref 11.4–15.2)

## 2025-02-02 LAB — MRSA NEXT GEN BY PCR, NASAL: MRSA by PCR Next Gen: NOT DETECTED

## 2025-02-02 MED ORDER — FAMOTIDINE 20 MG PO TABS: 20.0000 mg | ORAL_TABLET | Freq: Two times a day (BID) | ORAL | Status: DC

## 2025-02-02 MED ORDER — ORAL CARE MOUTH RINSE: 15.0000 mL | OROMUCOSAL | Status: AC | PRN

## 2025-02-02 MED ORDER — ORAL CARE MOUTH RINSE
15.0000 mL | OROMUCOSAL | Status: AC
  Administered 2025-02-02 (×4): 15 mL via OROMUCOSAL

## 2025-02-02 MED ORDER — VASOPRESSIN 20 UNITS/100 ML INFUSION FOR SHOCK
0.0000 [IU]/min | INTRAVENOUS | Status: AC
  Administered 2025-02-02: 0.03 [IU]/min via INTRAVENOUS
  Filled 2025-02-02: qty 100

## 2025-02-02 MED ORDER — SENNA 8.6 MG PO TABS
1.0000 | ORAL_TABLET | Freq: Two times a day (BID) | ORAL | Status: AC
  Administered 2025-02-02 (×2): 8.6 mg
  Filled 2025-02-02 (×2): qty 1

## 2025-02-02 MED ORDER — INSULIN GLARGINE-YFGN 100 UNIT/ML ~~LOC~~ SOLN
10.0000 [IU] | Freq: Every day | SUBCUTANEOUS | Status: AC
  Administered 2025-02-02: 10 [IU] via SUBCUTANEOUS
  Filled 2025-02-02 (×2): qty 0.1

## 2025-02-02 MED ORDER — HYDROCORTISONE SOD SUC (PF) 100 MG IJ SOLR
100.0000 mg | Freq: Two times a day (BID) | INTRAMUSCULAR | Status: AC
  Administered 2025-02-02: 100 mg via INTRAVENOUS
  Filled 2025-02-02: qty 2

## 2025-02-02 MED ORDER — IOHEXOL 350 MG/ML SOLN
75.0000 mL | Freq: Once | INTRAVENOUS | Status: AC | PRN
  Administered 2025-02-02: 75 mL via INTRAVENOUS

## 2025-02-02 MED ORDER — LACTATED RINGERS IV BOLUS (SEPSIS)
1000.0000 mL | Freq: Once | INTRAVENOUS | Status: AC
  Administered 2025-02-02: 1000 mL via INTRAVENOUS

## 2025-02-02 MED ORDER — LACTATED RINGERS IV SOLN: INTRAVENOUS | Status: DC

## 2025-02-02 MED ORDER — PANTOPRAZOLE SODIUM 40 MG IV SOLR
40.0000 mg | INTRAVENOUS | Status: AC
  Administered 2025-02-02: 40 mg via INTRAVENOUS
  Filled 2025-02-02: qty 10

## 2025-02-02 MED ORDER — HYDROCORTISONE SOD SUC (PF) 100 MG IJ SOLR
100.0000 mg | Freq: Three times a day (TID) | INTRAMUSCULAR | Status: DC
  Administered 2025-02-02: 100 mg via INTRAVENOUS
  Filled 2025-02-02: qty 2

## 2025-02-02 MED ORDER — SODIUM CHLORIDE 0.9 % IV SOLN
1.0000 g | Freq: Two times a day (BID) | INTRAVENOUS | Status: AC
  Administered 2025-02-02: 1 g via INTRAVENOUS
  Filled 2025-02-02: qty 20

## 2025-02-02 MED ORDER — LACTATED RINGERS IV SOLN: INTRAVENOUS | Status: AC

## 2025-02-02 MED ORDER — LEVETIRACETAM (KEPPRA) 500 MG/5 ML ADULT IV PUSH
1000.0000 mg | Freq: Once | INTRAVENOUS | Status: AC
  Administered 2025-02-02: 1000 mg via INTRAVENOUS
  Filled 2025-02-02: qty 10

## 2025-02-02 MED ORDER — FAMOTIDINE 20 MG PO TABS
20.0000 mg | ORAL_TABLET | Freq: Two times a day (BID) | ORAL | Status: DC
  Administered 2025-02-02: 20 mg
  Filled 2025-02-02: qty 1

## 2025-02-02 MED ORDER — HEPARIN SODIUM (PORCINE) 5000 UNIT/ML IJ SOLN: 5000.0000 [IU] | Freq: Three times a day (TID) | INTRAMUSCULAR | Status: AC

## 2025-02-02 MED ORDER — ENOXAPARIN SODIUM 40 MG/0.4ML IJ SOSY
40.0000 mg | PREFILLED_SYRINGE | Freq: Every day | INTRAMUSCULAR | Status: DC
  Administered 2025-02-02: 40 mg via SUBCUTANEOUS
  Filled 2025-02-02: qty 0.4

## 2025-02-02 MED ORDER — SENNA 8.6 MG PO TABS: 1.0000 | ORAL_TABLET | Freq: Two times a day (BID) | ORAL | Status: AC | PRN

## 2025-02-02 MED ORDER — DEXTROSE 50 % IV SOLN: 0.0000 mL | INTRAVENOUS | Status: AC | PRN

## 2025-02-02 MED ORDER — LORAZEPAM 2 MG/ML IJ SOLN: 1.0000 mg | INTRAMUSCULAR | Status: DC | PRN

## 2025-02-02 MED ORDER — DEXTROSE IN LACTATED RINGERS 5 % IV SOLN: INTRAVENOUS | Status: DC

## 2025-02-02 MED ORDER — LEVETIRACETAM 500 MG PO TABS
500.0000 mg | ORAL_TABLET | Freq: Two times a day (BID) | ORAL | Status: DC
  Filled 2025-02-02: qty 1

## 2025-02-02 MED ORDER — INSULIN ASPART 100 UNIT/ML IJ SOLN
0.0000 [IU] | INTRAMUSCULAR | Status: AC
  Administered 2025-02-02: 2 [IU] via SUBCUTANEOUS
  Administered 2025-02-02: 5 [IU] via SUBCUTANEOUS
  Filled 2025-02-02: qty 5
  Filled 2025-02-02: qty 2

## 2025-02-02 MED ORDER — LINEZOLID 600 MG/300ML IV SOLN: 600.0000 mg | Freq: Two times a day (BID) | INTRAVENOUS | Status: DC

## 2025-02-02 MED ORDER — VANCOMYCIN HCL 1500 MG/300ML IV SOLN
1500.0000 mg | Freq: Once | INTRAVENOUS | Status: AC
  Administered 2025-02-02: 1500 mg via INTRAVENOUS
  Filled 2025-02-02: qty 300

## 2025-02-02 MED ORDER — NOREPINEPHRINE 4 MG/250ML-% IV SOLN
0.0000 ug/min | INTRAVENOUS | Status: AC
  Administered 2025-02-02: 8 ug/min via INTRAVENOUS
  Administered 2025-02-02: 2 ug/min via INTRAVENOUS
  Administered 2025-02-02: 15 ug/min via INTRAVENOUS
  Filled 2025-02-02 (×3): qty 250

## 2025-02-02 MED ORDER — LEVETIRACETAM 500 MG PO TABS
500.0000 mg | ORAL_TABLET | Freq: Two times a day (BID) | ORAL | Status: AC
  Administered 2025-02-02: 500 mg
  Filled 2025-02-02: qty 1

## 2025-02-02 MED ORDER — MIDODRINE HCL 5 MG PO TABS
10.0000 mg | ORAL_TABLET | Freq: Three times a day (TID) | ORAL | Status: AC
  Administered 2025-02-02 (×4): 10 mg
  Filled 2025-02-02 (×4): qty 2

## 2025-02-02 MED ORDER — INSULIN REGULAR(HUMAN) IN NACL 100-0.9 UT/100ML-% IV SOLN
INTRAVENOUS | Status: DC
  Administered 2025-02-02: 13 [IU]/h via INTRAVENOUS
  Filled 2025-02-02: qty 100

## 2025-02-02 MED ORDER — SODIUM CHLORIDE 0.9 % IV SOLN: 1.0000 g | Freq: Three times a day (TID) | INTRAVENOUS | Status: DC

## 2025-02-02 MED ORDER — IPRATROPIUM-ALBUTEROL 0.5-2.5 (3) MG/3ML IN SOLN: 3.0000 mL | Freq: Four times a day (QID) | RESPIRATORY_TRACT | Status: AC | PRN

## 2025-02-02 MED ORDER — LIOTHYRONINE SODIUM 5 MCG PO TABS
7.5000 ug | ORAL_TABLET | Freq: Two times a day (BID) | ORAL | Status: AC
  Administered 2025-02-02 (×2): 7.5 ug
  Filled 2025-02-02 (×2): qty 2

## 2025-02-02 MED ORDER — SODIUM CHLORIDE 0.9 % IV SOLN
1.0000 g | Freq: Once | INTRAVENOUS | Status: AC
  Administered 2025-02-02: 1 g via INTRAVENOUS
  Filled 2025-02-02: qty 20

## 2025-02-02 MED ORDER — POLYETHYLENE GLYCOL 3350 17 G PO PACK: 17.0000 g | PACK | Freq: Every day | ORAL | Status: AC | PRN

## 2025-02-02 MED ORDER — POLYETHYLENE GLYCOL 3350 17 G PO PACK
17.0000 g | PACK | Freq: Every day | ORAL | Status: AC
  Administered 2025-02-02: 17 g
  Filled 2025-02-02: qty 1

## 2025-02-02 MED ORDER — FENTANYL CITRATE (PF) 50 MCG/ML IJ SOSY: 50.0000 ug | PREFILLED_SYRINGE | INTRAMUSCULAR | Status: AC | PRN

## 2025-02-02 MED ORDER — VANCOMYCIN HCL 1250 MG/250ML IV SOLN: 1250.0000 mg | INTRAVENOUS | Status: DC

## 2025-02-02 MED ORDER — SODIUM BICARBONATE 8.4 % IV SOLN
100.0000 meq | Freq: Once | INTRAVENOUS | Status: AC
  Administered 2025-02-02: 100 meq via INTRAVENOUS
  Filled 2025-02-02: qty 100

## 2025-02-02 MED ORDER — CHLORHEXIDINE GLUCONATE CLOTH 2 % EX PADS
6.0000 | MEDICATED_PAD | Freq: Every day | CUTANEOUS | Status: AC
  Administered 2025-02-02: 6 via TOPICAL

## 2025-02-02 MED ORDER — LEVOTHYROXINE SODIUM 100 MCG PO TABS
100.0000 ug | ORAL_TABLET | Freq: Every day | ORAL | Status: AC
  Administered 2025-02-02: 100 ug
  Filled 2025-02-02: qty 1

## 2025-02-02 NOTE — Progress Notes (Signed)
 Pharmacy Antibiotic Note  Joshua Haley is a 74 y.o. male admitted from [facility] on 02/02/2025 with possible sepsis.  Pharmacy has been consulted for Vancomycin  and Meropenem  dosing.  Plan: Vancomycin  1250 mg IV q48h Meropenem  1 g IV q12h  Height: 6' (182.9 cm) IBW/kg (Calculated) : 77.6  Temp (24hrs), Avg:98.8 F (37.1 C), Min:98.8 F (37.1 C), Max:98.8 F (37.1 C)  Recent Labs  Lab 02/02/25 0430 02/02/25 0444 02/02/25 0634  WBC 20.7*  --   --   CREATININE 1.73*  --   --   LATICACIDVEN  --  6.8* 5.9*    CrCl cannot be calculated (Unknown ideal weight.).    Allergies[1]   Joshua Haley 02/02/2025 6:43 AM     [1]  Allergies Allergen Reactions   Morphine And Codeine Other (See Comments)    Not specified on MAR    Penicillins Other (See Comments)   Cefepime  Other (See Comments)    Unknown reaction per [facility]   Statins Nausea And Vomiting

## 2025-02-02 NOTE — Sepsis Progress Note (Signed)
 Elink monitoring for the code sepsis protocol.

## 2025-02-02 NOTE — Progress Notes (Signed)
 "  NAME:  Joshua Haley, MRN:  [id number], DOB:  [birth year], LOS: 0 ADMISSION DATE:  02/02/2025, CONSULTATION DATE:  2/6 REFERRING MD:  Dr. Lorette, CHIEF COMPLAINT:  Shock   History of Present Illness:  74 year old male with past medical history as below, which is significant for traumatic brain injury, aphasia, trach dependent, hypothyroidism, decubitus ulcers, diabetes, chronic hypotension, and multiple admissions for multidrug-resistant infections including pneumonia, urinary tract, and infected ulcers. He was admitted to [facility] health from 12/8 to 12/12 due to pneumonia and UTI. Urine cultures during that admission were positive for multidrug-resistant Klebsiella and E. coli. He was again admitted from 12/28 to 1/6 for septic shock secondary to Proteus bacteremia, pneumonia, and diabetic ketoacidosis. He was initiated on the ventilator via trach at the beginning of the hospitalization was ultimately able to be weaned off to trach collar. He was discharged from [facility] to [facility] 1/6.   During the LTAC admission, patient was still requiring vent support and antibiotic therapy. He also underwent sacral wound debridement on 1/13, discharged with wound vac. Because of recurrent aspiration, wife mentioned that [facility] doctor's thought this was secondary to G/J tubes and switched patient to a JG tube on 2/3. Patient was eventually discharged from [facility] on 2/5.   Upon arrival to [facility] 2/5 he was toxic appearing and hypotensive. [facility] called EMS shortly after arrival and he was transported to [facility] emergency department. Immediately upon arrival to the emergency department he was noted to be hypotensive.   Patient had been on trach collar at the [facility]. Placed on the ventilator when admitted to [facility]. In the ED. Patient received 3 L of crystalloid resuscitation he was started on norepinephrine  infusion. As infusion rate from norepinephrine  increased he was started on  vasopressin . Central line was placed by EDP. He was treated with empiric antibiotics for presumed sepsis. Patient was also started on insulin  infusion for hyperglycemia. PCCM is asked to admit.   Pertinent  Medical History  has a past medical history of Acute on chronic urinary retention, CAD (coronary artery disease), Diabetes mellitus without complication (HCC), Hypertension, Hypothyroidism, Kidney stone, TBI (traumatic brain injury) (HCC), and Tracheostomy in place [facility]).   Significant Hospital Events: Including procedures, antibiotic start and stop dates in addition to other pertinent events   2/6 Admitted to the ICU; full mechanical ventilation, levo, insulin , and fluid resuscitation started.   Interim History / Subjective:  Patient was brought to the unit with CVC line, on levo, and insulin  gtt. [facility] paperwork at bedside.  Objective   Blood pressure 121/65, pulse (!) 115, temperature (!) 100.4 F (38 C), temperature source Oral, resp. rate (!) 25, height 6' (1.829 m), SpO2 98%.    Vent Mode: PRVC FiO2 (%):  [40 %-50 %] 40 % Set Rate:  [15 bmp] 15 bmp Vt Set:  [620 mL] 620 mL PEEP:  [5 cmH20] 5 cmH20 Plateau Pressure:  [26 cmH20] 26 cmH20   Intake/Output Summary (Last 24 hours) at 02/02/2025 1035 Last data filed at 02/02/2025 1000 Gross per 24 hour  Intake 3746.04 ml  Output --  Net 3746.04 ml   There were no vitals filed for this visit.  Examination: General: Ill appearing elderly male with trach in place on minimal vent support laying in bed in NAD HENT: PERRLA, non icterus sclera Lungs: Air movement bilateral. Trach leak noted on inspection. No accessory muscle use Cardiovascular: RRR Abdomen: soft. +bowel sounds. J/G tube present, not leaking Extremities:  Multiple wounds present; see pictures. Dry, tenting skin.  Neuro: Somnolent, responds to pain.Aphasic at baseline.  GU: Chronic urethral catheter; unchanged from 1/6 admission with dark urine and urine sediment at the  bottom  Resolved Hospital Problem list     Assessment & Plan:  Neuro  Acute toxic vs metabolic encephalopathy Hx of TBI c/b SDH and SAH in 2022 Aphasic at baseline. Weakness and lack of sensation on the upper and lower extremity at baseline.  Holding PTA Modafinil  100 mf daily and Amantadine  100 mg daily  - Treatment for sepsis as below  Hx of Seizures - Continue on Keppra  500 mg BID  Depression - Continue Sertraline  25 mg daily  Acute on chronic respiratory failure Chronic tracheostomy Recurrent aspiration PNA - Evaluate with CT chest angio; r/o PE given immobility and better characterize PNA - Cuff leak; review on CT and exchange cannula - Duoneb PRN  Cardiac Hx CAD s/p CABG in 2021 Troponin elevation In setting of demand ischemia likely in setting of acute illness Troponin 106>81. 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 May not support GDMT  Shock Septic vs hypovolemic - Currently on levo; decreasing to 12 - Random cortisol pending - Patient on chronic midodrine  therapy  - Continue Hydrocortisone  100 mg Q12HR - TTE with global hypokinesis; lactic acid down-trending  GI G/J tube exchanged on 01/30/2025 G- for medications J -tube for feeds - transition to home PPI, PTA - RD consult ordered - BM  ID Sepsis of unknown source Chronic trach, multiple wounds Unchanged foley cath >2 Hx of aspiration events Hx of MDRO E coli and MDRO Klebsiella PNA ?RLL infiltrate on CXR, improved from 1/6 hospitalization then admitted to [facility] hospital for vent Febrile with WBC 20.7 on admission, hypotension, renal dysfunction, and encephalopathy.  - MRSA: Negative - Neg resp viral panel - Abx: - 2/6 Vanc >Zyvox  in setting of deep skin wounds - 2/6 Meropenem  - Lines: CVC triple lumen and PIV x2. Chronic foley cath - Exchanged foley cath:  there is increased sediment in tubing - NEW urinalysis and cultures - LR 13ml/HR  Endo   Insulin -dependent DM A1c 2/6 6.8. Hyperglycemic on admission. No evidence of DKA. PTA glargine 26 units daily  - Currently on insulin  gtt  - switch to Glarg 10  - SSI  once cBG<180  - Reconsider once on TF - Goal cBG 100-180 - Transition to subcutaneous insulin  when BG 180  Hypothyroidism TSH on admission 6.3 from 1.9 ~ a month ago. Suspect in setting of acute illness PTA: Levothyroxine  100mcg per Gtube and Liothyronine  7.5 mcg BID  Renal High anion gap metabolic acidosis Hyperlactatemia Initial lactic acid 6.8> 5.9. Neg urinary and serum ketones.  - Trend lactic acid to resolution  - LR 160ml/HR  AKI BUN/Cr 79/1.73 >78/1.55 after 3x IVF boluses - LR 128ml/HR - Bladder scan - CT abd pelvis with contrast today r.o obstruction - Change foley cath; u/a and culture to treat infection  Neurogenic bladder Urethral catheter in place - Exchanged  Hyponatremia Suspect hyponatremic hyponatremia - Recheck after fluid resuscitation  Heme WBC: 20.7 in setting of infection  Chronic normocytic anemia Suspect chronic disease. Baseline 8.5. Admission hgb 11.1 - Trend Hgb, transfuse for Hgb <7  Thrombocytosis 635 on admission. Has been elevated since dc from [facility] (1/6 627). 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 S/p wound vac, not present on admission - wound care consult - CT abdomen and pelvis with contrast  GOC DNR-limited. Discussed with patient's wife; updated about current status. [place], spouse, goes by [facility]. She tells me the patient prefers to be called by his middle name 'Richard'. She plans to be in the hospital on 2/7. Agrees to re-engage with palliative care for ongoing  conversations for Richard's quality of life and support during this hospitalization - Palliative care consult placed   Labs   CBC: Recent Labs  Lab 02/02/25 0430 02/02/25 0446  WBC 20.7*  --   NEUTROABS 19.3*  --   HGB 11.1* 12.9*  HCT 36.6* 38.0*  MCV 88.2  --   PLT 635*  --     Basic Metabolic Panel: Recent Labs  Lab 02/02/25 0430 02/02/25 0446  NA 132* 136  K 4.6 4.5  CL 93*  --   CO2 20*  --   GLUCOSE 429*  --   BUN 79*  --   CREATININE 1.73*  --   CALCIUM 9.8  --    GFR: CrCl cannot be calculated (Unknown ideal weight.). Recent Labs  Lab 02/02/25 0430 02/02/25 0444 02/02/25 0634  WBC 20.7*  --   --   LATICACIDVEN  --  6.8* 5.9*    Liver Function Tests: Recent Labs  Lab 02/02/25 0430  AST 23  ALT 16  ALKPHOS 257*  BILITOT 0.6  PROT 8.9*  ALBUMIN 2.9*   No results for input(s): LIPASE, AMYLASE in the last 168 hours. No results for input(s): AMMONIA in the last 168 hours.  ABG    Component Value Date/Time   PHART 7.250 (L) 12/24/2024 2116   PCO2ART 44.6 12/24/2024 2116   PO2ART 186 (H) 12/24/2024 2116   HCO3 23.2 02/02/2025 0446   TCO2 24 02/02/2025 0446   ACIDBASEDEF 7.0 (H) 12/24/2024 2116   O2SAT 77 02/02/2025 0446     Coagulation Profile: Recent Labs  Lab 02/02/25 0430  INR 1.1    Cardiac Enzymes: No results for input(s): CKTOTAL, CKMB, CKMBINDEX, TROPONINI in the last 168 hours.  HbA1C: Hgb A1c MFr Bld  Date/Time Value Ref Range Status  02/02/2025 04:30 AM 6.8 (H) 4.8 - 5.6 % Final    Comment:    (NOTE) Diagnosis of Diabetes The following HbA1c ranges recommended by the American Diabetes Association (ADA) may be used as an aid in the diagnosis of diabetes mellitus.  Hemoglobin             Suggested A1C NGSP%              Diagnosis  <5.7                   Non Diabetic  5.7-6.4                Pre-Diabetic  >6.4                   Diabetic  <7.0                   Glycemic control for                        adults with diabetes.    10/13/2024 02:58 PM 7.7 (H) 4.8 - 5.6 % Final  Comment:    (NOTE) Diagnosis of Diabetes The following HbA1c ranges recommended by the American Diabetes Association (ADA) may be used as an aid in the diagnosis of diabetes mellitus.  Hemoglobin             Suggested A1C NGSP%              Diagnosis  <5.7                   Non Diabetic  5.7-6.4                Pre-Diabetic  >6.4                   Diabetic  <7.0                   Glycemic control for                       adults with diabetes.      CBG: Recent Labs  Lab 02/02/25 0447 02/02/25 0701 02/02/25 0807 02/02/25 0826  GLUCAP 403* 346* 292* 304*    Review of Systems:     Past Medical History:  He,  has a past medical history of Acute on chronic urinary retention, CAD (coronary artery disease), Diabetes mellitus without complication (HCC), Hypertension, Hypothyroidism, Kidney stone, TBI (traumatic brain injury) (HCC), and Tracheostomy in place [facility]).   Surgical History:   Past Surgical History:  Procedure Laterality Date   CHOLECYSTECTOMY     CRANIECTOMY     JEJUNOSTOMY FEEDING TUBE     LEFT HEART CATH AND CORONARY ANGIOGRAPHY N/A 01/22/2020   Procedure: LEFT HEART CATH AND CORONARY ANGIOGRAPHY and possible PCI and stent;  Surgeon: Florencio Cara BIRCH, MD;  Location: [facility] INVASIVE CV LAB;  Service: Cardiovascular;  Laterality: N/A;   tracheostomy       Social History:   reports that he has quit smoking. He has never used smokeless tobacco. He reports that he does not currently use alcohol . He reports that he does not currently use drugs.   Family History:  His family history is not on file.   Allergies Allergies[1]   Home Medications  Prior to Admission medications  Medication Sig Start Date End Date Taking? Authorizing Provider  acetaminophen  (TYLENOL ) 325 MG tablet Place 650 mg into feeding tube every 6 (six) hours as needed for mild pain (pain score 1-3) or fever.     [provider]  amantadine  (SYMMETREL ) 50 MG/5ML solution Place 100 mg into feeding tube daily.    [provider]  artificial tears ophthalmic solution Place 2 drops into both eyes as needed for dry eyes. Patient taking differently: Place 2 drops into both eyes every 4 (four) hours as needed for dry eyes. 10/17/24   Sherrill Cable Latif, DO  ascorbic acid  (VITAMIN C ) 500 MG tablet Place 500 mg into feeding tube daily. Give 500mg  per tube daily    [provider]  Bempedoic Acid (NEXLETOL) 180 MG TABS Place 180 mg into feeding tube in the morning.    [provider]  cetirizine (ZYRTEC) 10 MG chewable tablet Place 10 mg into feeding tube daily.    [provider]  Dextrose , Diabetic Use, (DEX4 FAST ACTING GLUCOSE PO) Take 15 g by mouth as needed (for hypoglycemia).    [provider]  docusate sodium  (COLACE) 100 MG capsule Take 1 capsule (100 mg total) by mouth 2 (two) times daily as  needed for mild constipation. 10/17/24   Sherrill Cable Latif, DO  enoxaparin  (LOVENOX ) 40 MG/0.4ML injection Inject 40 mg into the skin daily.    [provider]  ferrous sulfate  300 (60 Fe) MG/5ML syrup Place 300 mg into feeding tube 2 (two) times daily.    [provider]  furosemide  (LASIX ) 20 MG tablet Place 1 tablet (20 mg total) into feeding tube daily. 10/17/24   Sheikh, Cable Latif, DO  gatifloxacin  (ZYMAXID ) 0.5 % SOLN Place 1 drop into both eyes 4 (four) times daily. 10/17/24   Sheikh, Omair Latif, DO  Glucagon (GVOKE HYPOPEN 1-PACK) 1 MG/0.2ML SOAJ Inject 1 mg into the skin as needed (for hypoglycemia).    [provider]  glucagon Emergency 1 MG SOLR Inject 1 mg into the muscle as needed (for hypoglycemia).    [provider]  insulin  aspart (FIASP  FLEXTOUCH) 100 UNIT/ML FlexTouch Pen Inject 0-15 Units into the skin every 6 (six) hours. Sliding scale insulin :  70-120= 0 units, 1 21-150= 2 units,  151-200= 3 units,   201-250= 5 units,  251-300= 8 units,  301-350= 11 units,  351-400= 15 units    [provider]  lansoprazole (PREVACID SOLUTAB) 30 MG disintegrating tablet Place 30 mg into feeding tube in the morning.    [provider]  levETIRAcetam  (KEPPRA ) 500 MG tablet Place 1 tablet (500 mg total) into feeding tube 2 (two) times daily. 01/27/23   Nguyen, Quan, DO  levothyroxine  (SYNTHROID ) 100 MCG tablet Place 100 mcg into feeding tube daily. Give 100mcg per tube daily    [provider]  liothyronine  (CYTOMEL ) 5 MCG tablet Place 7.5 mcg into feeding tube every 12 (twelve) hours. Give one and one half tablet (7.5mg ) per tube every 12 hours    [provider]  Methylcellulose, Laxative, (CITRUCEL) 500 MG TABS Place 1 tablet (500 mg total) into feeding tube 3 (three) times daily as needed (Constipation). 10/17/24   Sherrill Cable Latif, DO  midodrine  (PROAMATINE ) 10 MG tablet Place 1 tablet (10 mg total) into feeding tube 3 (three) times daily with meals. 10/17/24   Sherrill Cable Latif, DO  modafinil  (PROVIGIL ) 100 MG tablet GIVE 1 TAB VIA TUBE ONCE DAILY 11/03/24   Fleeta Finger, Selinda, MD  Nutritional Supplements (FEEDING SUPPLEMENT, GLUCERNA 1.5 CAL,) LIQD Place 60 mL/hr into feeding tube See admin instructions. Give 38mL/hr per tube by shift    [provider]  omega-3 acid ethyl esters (LOVAZA ) 1 g capsule Place 1 g into feeding tube daily. Give 1 g per tube daily    [provider]  ondansetron  (ZOFRAN -ODT) 4 MG disintegrating tablet Take 4 mg by mouth every 6 (six) hours as needed for nausea or vomiting (PER TUBE). Per tube    [provider]  polyethylene glycol (MIRALAX  / GLYCOLAX ) 17 g packet Give 17g per tube daily as needed for constipation    [provider]  sertraline  (ZOLOFT ) 25 MG tablet Place 25 mg into feeding tube daily. Give 25mg  per tube daily    [provider]  sodium chloride  irrigation 0.9 % irrigation Irrigate  with 60 mLs as directed every 8 (eight) hours. Irrigate indwelling foley with 60mL sterile saline every 8 hours.    [provider]     Critical care time:       Hadassah Kristy Ahr, MD [facility] Internal Medicine Program - PGY-3 02/02/2025, 10:35 AM      [1]  Allergies Allergen Reactions   Morphine And  Codeine Other (See Comments)    Not specified on MAR    Penicillins Other (See Comments)   Cefepime  Other (See Comments)    Unknown reaction per [facility]   Statins Nausea And Vomiting   "

## 2025-02-02 NOTE — Progress Notes (Incomplete)
 call [facility]

## 2025-02-02 NOTE — ED Notes (Signed)
 CBG 346

## 2025-02-02 NOTE — Inpatient Diabetes Management (Addendum)
 Inpatient Diabetes Program Recommendations  AACE/ADA: New Consensus Statement on Inpatient Glycemic Control   Target Ranges:  Prepandial:   less than 140 mg/dL      Peak postprandial:   less than 180 mg/dL (1-2 hours)      Critically ill patients:  140 - 180 mg/dL   Lab Results  Component Value Date   GLUCAP 292 (H) 02/02/2025   HGBA1C 6.8 (H) 02/02/2025    Latest Reference Range & Units 02/02/25 04:47 02/02/25 07:01 02/02/25 08:07  Glucose-Capillary 70 - 99 mg/dL 596 (H) 653 (H) 707 (H)    Latest Reference Range & Units Most Recent  Potassium 3.5 - 5.1 mmol/L 4.5 02/02/25 04:46  Chloride 98 - 111 mmol/L 93 (L) 02/02/25 04:30  CO2 22 - 32 mmol/L 20 (L) 02/02/25 04:30  Glucose 70 - 99 mg/dL 570 (H) 06/29/72 95:69  Mean Plasma Glucose mg/dL 851.53 06/29/72 95:69  BUN 8 - 23 mg/dL 79 (H) 06/29/72 95:69  Creatinine 0.61 - 1.24 mg/dL 8.26 (H) 06/29/72 95:69  Calcium 8.9 - 10.3 mg/dL 9.8 06/29/72 95:69  Anion gap 5 - 15  19 (H) 02/02/25 04:30    Latest Reference Range & Units Most Recent  Lactic Acid, Venous 0.5 - 1.9 mmol/L 5.9 (HH) 02/02/25 06:34    Latest Reference Range & Units Most Recent  Beta-Hydroxybutyric Acid 0.05 - 0.27 mmol/L 0.27 02/02/25 04:30  Glucose 70 - 99 mg/dL 570 (H) 06/29/72 95:69  Hemoglobin A1C 4.8 - 5.6 % 6.8 (H) 02/02/25 04:30   Review of Glycemic Control  Diabetes history: DM2  Outpatient Diabetes medications:  Fiasp  0-15 units Q4HRS   Noted on discharge summary on 01/02/25 patient was discharged on Semglee  15 units daily and Fiasp  0-15 units q6hrs. However, only Fiasp  is listed on home meds.   Current orders for Inpatient glycemic control:  IV Insulin    Inpatient Diabetes Program Recommendations:   When transitioning from IV Insulin  to SQ insulin  is appropriate, please consider:  - Glargine 8 units daily  - Novolog  0-15 units Q4HRS  Thanks,  Lavanda Search, RN, MSN, [facility]  Inpatient Diabetes Coordinator  Pager [phone] (8a-5p)

## 2025-02-02 NOTE — Consult Note (Signed)
 "                              Consultation Note Date: 02/02/2025   Patient Name: Joshua Haley  DOB: 1951/11/26  MRN: [id number]  Age / Sex: 74 y.o., male  PCP: Fleeta Valeria Mayo, MD Referring Physician: Zaida Zola SAILOR, MD  Reason for Consultation: Establishing goals of care  HPI/Patient Profile: 74 y.o. male  with past medical history significant of traumatic brain injury, aphasia, trach dependent, hypothyroidism, decubitus ulcers, diabetes, chronic hypotension, and multiple admissions for multidrug-resistant infections including pneumonia, urinary tract infection, and infected ulcers.  Recently admitted to [facility] health from 12/04/2024 to 12/08/2024 due to pneumonia and UTI.  Urine cultures during that admission were positive for MDR Klebsiella and E. coli.  He was again admitted from 12/24/2024 to 01/02/2025 for septic shock secondary to Proteus bacteremia, pneumonia and diabetic ketoacidosis.  He was initiated on ventilator via trach at the beginning of the hospitalization, was ultimately able to weaned off to trach collar.  He was discharged from [facility] to [facility] on 01/02/2025.   During the LTAC admission, patient was still requiring vent support and antibiotic therapy.  He also underwent sacral wound debridement on 01/09/2025, discharged with wound VAC.  Because of recurrent aspiration, wife mentioned that [facility] doctors thought this was secondary to G/J-tube's and switch patient to a JG tube on 01/30/2025.  Patient was eventually discharged from [facility] to [facility] on 02/21/2025.  Upon arrival to [facility] on 02/01/2025, he was toxic appearing and hypotensive.  [facility] called EMS shortly after arrival and he was transported to [facility] emergency department.  Immediately upon arrival to the emergency department he was noted to be hypotensive.  Patient had been on trach collar at [facility].  Placed on ventilator when admitted to [facility].  In the ED, patient received 3 L of crystalloid  resuscitation he was started on norepinephrine  infusion.  As infusion rate from norepinephrine  increase he was started on vasopressin .  Central line was placed by EDP.  He was treated with empiric antibiotics for presumed sepsis.  Patient was also started on insulin  drip for hyperglycemia.  PCCM is asked to admit.  Currently on full mechanical ventilation, on Levophed , insulin , and fluid resuscitation.  Worth the note patient has had 4 inpatient admissions in the last 6 months.  PMT has been consulted to assist with goals of care conversation. Patient/Family face treatment option decisions, advanced directive decisions and anticipatory care needs.   Family face treatment option decision, advance directive decisions and anticipatory care needs.   Clinical Assessment and Goals of Care:  I have reviewed medical records including: EPIC notes: Reviewed critical care progress note  from Dr. Elnora detailing treatment plan mainly for acute toxic versus metabolic encephalopathy, septic shock and acute on chronic respiratory failure, on chronic tracheostomy, recurrent aspiration pneumonia.  Reviewed to track clinical course and prognostication.  Vital signs: Reviewed from 02/02/2025 at 1516: Temperature 101.8 F, blood pressure 106/57 mmHg, heart rate 112 bpm, respiration 22 breaths/min, oxygen saturation 97% and mechanical vent support. MAR: MAR reviewed: Levophed  at 8 mcg/min, vasopressin  0.01 units/min. Also on multiple IV antibiotics. Reviewed PRN meds received over the last 24 hours. Reviewed to assess needs for medication adjustment to optimize comfort.  Available advanced directives in ACP:  MOST and DNR form on file.  Labs/imaging: Reviewed labs from 02/02/2025: Crea elevated at 1.55 with decreased  GFR 47.  Hypophosphatemia at 2.0, troponin elevated at 81, lactic acid elevated at 3.5.  Ongoing leukocytosis with WBC at 20.7.  Urinalysis result reviewed.  CT angio chest from 02/02/2025 reviewed: No  definite evidence of pulmonary embolus.  Concerning for mild improving pneumonia or atelectasis.  Increased reticular opacity to the posterior right upper lobe concerning for developing pneumonia.  Reviewed to assist with prescribing and prognostication   Visited patient at bedside. Critically-ill appearing. On semi-fowlers position. Unable to communicate. On mechanical ventilatory support via tracheostomy.  Aphasic at baseline. Noted for ongoing levo drip.   Discussed plan of care with Primary RN and Dr. Kristy. Patient is currently being treated fro sepsis, acute on chronic respiratory failure. Awaiting outcomes, work-up in progress. Patient DNR-limited. Patient's wife plans to visit patient tomorrow morning.   Shortly after visiting patient, I tried to reach out to patient's wife [facility] telephonically, no answer, I left a message requesting for a return call. At around 4:29PM, I recevied a return call. She confirmed that she will be visiting her husband tomorrow morning and plans to meet with me for a GOC discussion.    Primary Decision Maker: Patient's wife, Dayna Geurts    SUMMARY OF RECOMMENDATIONS   # Complex medical decision making/goals of care Code Status: Maintain DNR-Limited. Currently admitted in ICU.  Being treated for sepsis and acute on chronic respiratory failure.  On mechanical event support through tracheostomy.  Also on Levophed  for hemodynamic support. Continue with current medical interventions, allow time for outcomes. Patient's wife plans to meet with PMT for goals of care discussion in person tomorrow, 02/03/2025. Patient ope to all offered and available interventions for medical improvement Spiritual care consult Palliative medicine team will continue to follow.   # Symptom management Per Primary team Palliative medicine is available to assist as needed.    # Psychosocial support Continue to provide psycho-social and emotional support to patient and  family   # Treatment plan discussed with:  Patient's wife, primary RN, and Dr. Elnora  Code Status/Advance Care Planning: DNR   Palliative Prophylaxis:  Aspiration, Bowel Regimen, Delirium Protocol, Eye Care, Frequent Pain Assessment, Oral Care, Palliative Wound Care, and Turn Reposition   Prognosis:  Prognosis is guarded given significant disease burden, and high risk for decompensation  Discharge Planning: To Be Determined      Primary Diagnoses: Present on Admission:  Septic shock [facility])    Physical Exam Vitals and nursing note reviewed.  Constitutional:      Appearance: He is ill-appearing.  HENT:     Head: Normocephalic and atraumatic.     Mouth/Throat:     Mouth: Mucous membranes are moist.  Neck:     Comments: Noted for presence of tracheostomy, connected to mechanical ventilator. Cardiovascular:     Rate and Rhythm: Tachycardia present.  Pulmonary:     Effort: Pulmonary effort is normal.  Abdominal:     General: Abdomen is flat. Bowel sounds are normal.     Palpations: Abdomen is soft.     Comments: J/G-tube present.  No leakage noted.   Genitourinary:    Comments: Foley catheter in situ connected to urine bag Musculoskeletal:     Comments: Bedbound  Skin:    General: Skin is warm.  Neurological:     Mental Status: He is alert. Mental status is at baseline.     Comments: Unable to assess, history of TBI  Psychiatric:     Comments: Unable to assess  Vital Signs: BP (!) 106/57   Pulse (!) 120   Temp 100.1 F (37.8 C) (Axillary)   Resp (!) 21   Ht 6' (1.829 m)   SpO2 97%   BMI 25.56 kg/m  Pain Scale: CPOT       SpO2: SpO2: 97 % O2 Device:SpO2: 97 % O2 Flow Rate: .O2 Flow Rate (L/min): 15 L/min   Palliative Assessment/Data: 20%   I personally spent a total of 25 minutes in the care of the patient today including preparing to see the patient, getting/reviewing separately obtained history, performing a medically  appropriate exam/evaluation, referring and communicating with other health care professionals, documenting clinical information in the EHR, and coordinating care.   Thank you for allowing us  to participate in the care of Adrain R Wojtkiewicz  Claudina Oliphant F Anjenette Gerbino, Jr, NP  Palliative Medicine Team Team phone # [phone]  Thank you for allowing the Palliative Medicine Team to assist in the care of this patient. Please utilize secure chat with additional questions, if there is no response within 30 minutes please call the above phone number.  Palliative Medicine Team providers are available by phone from 7am to 7pm daily and can be reached through the team cell phone.  Should this patient require assistance outside of these hours, please call the patient's attending physician.   "

## 2025-02-02 NOTE — Progress Notes (Signed)
 PHARMACY - PHYSICIAN COMMUNICATION CRITICAL VALUE ALERT - BLOOD CULTURE IDENTIFICATION (BCID)  Joshua Haley is an 74 y.o. male who presented to [facility] on 02/02/2025   Name of physician (or Provider) Contacted: Dr. Theodoro  Current antibiotics: Linezolid , Merrem   Changes to prescribed antibiotics recommended:  DC Linezolid   Continue Merrem   Results for orders placed or performed during the hospital encounter of 02/02/25  Blood Culture ID Panel (Reflexed) (Collected: 02/02/2025  4:39 AM)  Result Value Ref Range   Enterococcus faecalis NOT DETECTED NOT DETECTED   Enterococcus Faecium NOT DETECTED NOT DETECTED   Listeria monocytogenes NOT DETECTED NOT DETECTED   Staphylococcus species NOT DETECTED NOT DETECTED   Staphylococcus aureus (BCID) NOT DETECTED NOT DETECTED   Staphylococcus epidermidis NOT DETECTED NOT DETECTED   Staphylococcus lugdunensis NOT DETECTED NOT DETECTED   Streptococcus species NOT DETECTED NOT DETECTED   Streptococcus agalactiae NOT DETECTED NOT DETECTED   Streptococcus pneumoniae NOT DETECTED NOT DETECTED   Streptococcus pyogenes NOT DETECTED NOT DETECTED   A.calcoaceticus-baumannii NOT DETECTED NOT DETECTED   Bacteroides fragilis NOT DETECTED NOT DETECTED   Enterobacterales DETECTED (A) NOT DETECTED   Enterobacter cloacae complex NOT DETECTED NOT DETECTED   Escherichia coli NOT DETECTED NOT DETECTED   Klebsiella aerogenes NOT DETECTED NOT DETECTED   Klebsiella oxytoca NOT DETECTED NOT DETECTED   Klebsiella pneumoniae NOT DETECTED NOT DETECTED   Proteus species DETECTED (A) NOT DETECTED   Salmonella species NOT DETECTED NOT DETECTED   Serratia marcescens NOT DETECTED NOT DETECTED   Haemophilus influenzae NOT DETECTED NOT DETECTED   Neisseria meningitidis NOT DETECTED NOT DETECTED   Pseudomonas aeruginosa NOT DETECTED NOT DETECTED   Stenotrophomonas maltophilia NOT DETECTED NOT DETECTED   Candida albicans NOT DETECTED NOT DETECTED   Candida auris NOT  DETECTED NOT DETECTED   Candida glabrata NOT DETECTED NOT DETECTED   Candida krusei NOT DETECTED NOT DETECTED   Candida parapsilosis NOT DETECTED NOT DETECTED   Candida tropicalis NOT DETECTED NOT DETECTED   Cryptococcus neoformans/gattii NOT DETECTED NOT DETECTED   CTX-M ESBL NOT DETECTED NOT DETECTED   Carbapenem resistance IMP NOT DETECTED NOT DETECTED   Carbapenem resistance KPC NOT DETECTED NOT DETECTED   Carbapenem resistance NDM NOT DETECTED NOT DETECTED   Carbapenem resist OXA 48 LIKE NOT DETECTED NOT DETECTED   Carbapenem resistance VIM NOT DETECTED NOT DETECTED    [name], [name] 02/02/2025  11:29 PM

## 2025-02-02 NOTE — Progress Notes (Signed)
" °  Echocardiogram 2D Echocardiogram has been performed.  Loucinda Croy 02/02/2025, 12:37 PM "

## 2025-02-02 NOTE — Consult Note (Signed)
" °  CLINICAL SUPPORT TEAM - WOUND OSTOMY AND CONTINENCE TEAM  CONSULTATION SERVICES   WOC Nurse-Inpatient Note   WOC Nurse Consult Note: Reason for Consult: requested to assess multiple wounds. Wounds were dressing when I visited the patient. Discussed with bed nurse who change it, pertinent information was explained to determine recommendation. Pressure Injury POA: Yes  Wound type: Right trochanter DTPI DTPI healed, new epithelize tissue, pink.  Measurement: 7.5 x 6.5 cm Dressing procedure/placement/frequency: Continue protecting the area with foam dressing, change every 3 days or PRN.    Wound type: L buttock PI stage 3 Measurement: 3 cm x 4.5 cm x 0.1 cm Wound bed: 80% pink, 20% superficial slough. Drainage (amount, consistency, odor) Small amount, serous, no odor. Periwound: intact, macerated. Dressing procedure/placement/frequency: Cleanse with Vashe [id number], not rinse. Apply a foam dressing to the wound bed, change every 3 days or PRN.    Wound type: Sacrum PI Stage 3 Measurement: 4 cm x 4 cm x 4 cm Wound bed: 80% red, 20% non viable tissue, dark coloration. Drainage (amount, consistency, odor) Small amount, serous, no odor. Periwound: intact, macerated. Dressing procedure/placement/frequency: Cleanse with Vashe [id number], not rinse.  Fill the wound with Aquacel WD# [phone] to the wound bed, top with foam dressing, change every 3 days or PRN. *Use Q-tip to ensure the dressing is touching the wound bed as needed.    Wound type: L ischium PI Stage 3 Measurement: 2.5 cm x 2.5 cm x 0.1 cm Wound bed: 100% pale red. Drainage (amount, consistency, odor) Small amount, serous, no odor. Periwound: intact, partial macerated. Dressing procedure/placement/frequency: Cleanse with Vashe [id number], not rinse.  Fill the wound with Aquacel WD# [phone] to the wound bed, top with foam dressing, change every 3 days or PRN. *Use Q-tip to ensure the dressing is touching the wound bed  as needed.  Wound type: Right ischium Unstageable PI. Measurement: 5 cm x 5 cm x 3 cm. Wound bed: 90% brown eschar with slough at the edges, 10% red. Drainage (amount, consistency, odor) scant amount, serosanguinous, no odor. Periwound: viable edges. Dressing procedure/placement/frequency: Cleanse with Vashe [id number], not rinse.  Apply moistened gauze with Vashe, top with foam dressing, change every 3 days or PRN.    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 Wound bed: 80% non-granulate tissue (biofilm), 20% slough. Bone preeminence was noticeable during the dressing change. Drainage (amount, consistency, odor) Moderate amount, serosanguinous, no odor. Periwound: viable edges. Dressing procedure/placement/frequency: Cleanse with Vashe [id number], not rinse.  Fill the wound with Aquacel WD# [id number] to the wound bed, top with foam dressing, change every 3 days or PRN. *Use Q-tip to ensure the dressing is touching the wound bed as needed.  WOC team will not plan to follow further. Please reconsult if further assistance is needed. Thank-you,  Lela Holm MSN, RN, CWCN, CNS.  (Phone [phone])       "

## 2025-02-02 NOTE — Progress Notes (Signed)
 Patient transported from 2M06 to CT and back on ventilator without complication. RN at bedside, no complications during transport.

## 2025-02-02 NOTE — Progress Notes (Signed)
 eLink Physician-Brief Progress Note Patient Name: Joshua Haley DOB: 22-Sep-1951 MRN: [id number]   Date of Service  02/02/2025  HPI/Events of Note  4/4 bottles of blood with Proteus species, currently on Linezolid /Meropenem    eICU Interventions  Ok to dc linezolid . Cont merrem . Levophed  dose improving.      Intervention Category Intermediate Interventions: Infection - evaluation and management  Donyel Nester 02/02/2025, 11:19 PM

## 2025-02-02 NOTE — ED Provider Notes (Signed)
 " [facility] Provider Note   CSN: [id number] Arrival date & time: 02/02/25  9580     Patient presents with: sepsis   Joshua Haley is a 74 y.o. male.   74 year old male who presents the ER today secondary to hypotension.  Patient's had a long complicated history.  Is oftentimes admitted to [facility] for bacteremia, cellulitis, pneumonia, UTI multidrug-resistant.  Most recently was admitted and discharged in January.  Went to LTAC and subsequently went to [facility] yesterday afternoon.  Afterwards patient progressively got more more ill to the point where is hypotensive, tachycardic and tachypneic.  On review of records it appears that his temp was 102.6, HR up to 160, might have given metoprolol  to help with it and became hypotensive?   Multiple previous records reviewed in epic and extensive notes at bedside.  In summary patient often comes in with septic shock related to bacteremia sometimes related to UTI or related to his skin ulcers.  Most recently was admitted to ICU for about a week and then on the floor for about a week and went to LTAC for about a month and then just returned to [facility] nursing facility yesterday.        Prior to Admission medications  Medication Sig Start Date End Date Taking? Authorizing Provider  acetaminophen  (TYLENOL ) 325 MG tablet Place 650 mg into feeding tube every 6 (six) hours as needed for mild pain (pain score 1-3) or fever.    [provider]  amantadine  (SYMMETREL ) 50 MG/5ML solution Place 100 mg into feeding tube daily.    [provider]  artificial tears ophthalmic solution Place 2 drops into both eyes as needed for dry eyes. Patient taking differently: Place 2 drops into both eyes every 4 (four) hours as needed for dry eyes. 10/17/24   Sheikh, Omair Latif, DO  ascorbic acid  (VITAMIN C ) 500 MG tablet Place 500 mg into feeding tube daily. Give 500mg  per tube daily    [provider]  Bempedoic Acid (NEXLETOL) 180 MG TABS Place 180 mg into feeding tube in the morning.    [provider]  cetirizine (ZYRTEC) 10 MG chewable tablet Place 10 mg into feeding tube daily.    [provider]  Dextrose , Diabetic Use, (DEX4 FAST ACTING GLUCOSE PO) Take 15 g by mouth as needed (for hypoglycemia).    [provider]  docusate sodium  (COLACE) 100 MG capsule Take 1 capsule (100 mg total) by mouth 2 (two) times daily as needed for mild constipation. 10/17/24   Sherrill Cable Latif, DO  enoxaparin  (LOVENOX ) 40 MG/0.4ML injection Inject 40 mg into the skin daily.    [provider]  ferrous sulfate  300 (60 Fe) MG/5ML syrup Place 300 mg into feeding tube 2 (two) times daily.    [provider]  furosemide  (LASIX ) 20 MG tablet Place 1 tablet (20 mg total) into feeding tube daily. 10/17/24   Sherrill Cable Latif, DO  gatifloxacin  (ZYMAXID ) 0.5 % SOLN Place 1 drop into both eyes 4 (four) times daily. 10/17/24   Sheikh, Omair Latif, DO  Glucagon (GVOKE HYPOPEN 1-PACK) 1 MG/0.2ML SOAJ Inject 1 mg into the skin as needed (for hypoglycemia).    [provider]  glucagon Emergency 1 MG SOLR Inject 1 mg into the muscle as needed (for hypoglycemia).    [provider]  insulin  aspart (FIASP  FLEXTOUCH) 100 UNIT/ML FlexTouch Pen Inject 0-15 Units into the skin every 6 (six) hours.  Sliding scale insulin :  70-120= 0 units, 1 21-150= 2 units,  151-200= 3 units,  201-250= 5 units,  251-300= 8 units,  301-350= 11 units,  351-400= 15 units    [provider]  lansoprazole (PREVACID SOLUTAB) 30 MG disintegrating tablet Place 30 mg into feeding tube in the morning.    [provider]  levETIRAcetam  (KEPPRA ) 500 MG tablet Place 1 tablet (500 mg total) into feeding tube 2 (two) times daily. 01/27/23   Nguyen, Quan, DO  levothyroxine  (SYNTHROID ) 100 MCG tablet Place 100 mcg into feeding tube daily. Give 100mcg per tube  daily    [provider]  liothyronine  (CYTOMEL ) 5 MCG tablet Place 7.5 mcg into feeding tube every 12 (twelve) hours. Give one and one half tablet (7.5mg ) per tube every 12 hours    [provider]  Methylcellulose, Laxative, (CITRUCEL) 500 MG TABS Place 1 tablet (500 mg total) into feeding tube 3 (three) times daily as needed (Constipation). 10/17/24   Sherrill Cable Latif, DO  midodrine  (PROAMATINE ) 10 MG tablet Place 1 tablet (10 mg total) into feeding tube 3 (three) times daily with meals. 10/17/24   Sherrill Cable Latif, DO  modafinil  (PROVIGIL ) 100 MG tablet GIVE 1 TAB VIA TUBE ONCE DAILY 11/03/24   Fleeta Finger, Selinda, MD  Nutritional Supplements (FEEDING SUPPLEMENT, GLUCERNA 1.5 CAL,) LIQD Place 60 mL/hr into feeding tube See admin instructions. Give 39mL/hr per tube by shift    [provider]  omega-3 acid ethyl esters (LOVAZA ) 1 g capsule Place 1 g into feeding tube daily. Give 1 g per tube daily    [provider]  ondansetron  (ZOFRAN -ODT) 4 MG disintegrating tablet Take 4 mg by mouth every 6 (six) hours as needed for nausea or vomiting (PER TUBE). Per tube    [provider]  polyethylene glycol (MIRALAX  / GLYCOLAX ) 17 g packet Give 17g per tube daily as needed for constipation    [provider]  sertraline  (ZOLOFT ) 25 MG tablet Place 25 mg into feeding tube daily. Give 25mg  per tube daily    [provider]  sodium chloride  irrigation 0.9 % irrigation Irrigate with 60 mLs as directed every 8 (eight) hours. Irrigate indwelling foley with 60mL sterile saline every 8 hours.    [provider]    Allergies: Morphine and codeine, Penicillins, Cefepime , and Statins    Review of Systems  Updated Vital Signs BP 90/67   Pulse (!) 102   Temp 98.8 F (37.1 C) (Axillary)   Resp (!) 21   Ht 6' (1.829 m)   SpO2 100%   BMI 25.56 kg/m   Physical Exam Vitals and nursing note reviewed.  Constitutional:      Appearance: He is  well-developed. He is ill-appearing.  HENT:     Head: Normocephalic and atraumatic.     Nose: No rhinorrhea.  Cardiovascular:     Rate and Rhythm: Tachycardia present.     Comments: Diminished perfusion distal extremities cool to touch Pulmonary:     Effort: Pulmonary effort is normal. Tachypnea present. No respiratory distress.     Breath sounds: Wheezing present.  Abdominal:     General: There is no distension.  Musculoskeletal:        General: Normal range of motion.     Cervical back: Normal range of motion.     Comments: Pressure ulcer on his left heel without any obvious infection.  Skin:    General: Skin is warm and dry.  Comments: Deep pressure ulcer to his left gluteal area that does not have any purulent drainage or significant erythema or concern for infection around it.  Neurological:     General: No focal deficit present.     Mental Status: He is alert.     Comments: Not responding to voice or pain  Roving eye movements     (all labs ordered are listed, but only abnormal results are displayed) Labs Reviewed  COMPREHENSIVE METABOLIC PANEL WITH GFR - Abnormal; Notable for the following components:      Result Value   Sodium 132 (*)    Chloride 93 (*)    CO2 20 (*)    Glucose, Bld 429 (*)    BUN 79 (*)    Creatinine, Ser 1.73 (*)    Total Protein 8.9 (*)    Albumin 2.9 (*)    Alkaline Phosphatase 257 (*)    GFR, Estimated 41 (*)    Anion gap 19 (*)    All other components within normal limits  CBC WITH DIFFERENTIAL/PLATELET - Abnormal; Notable for the following components:   WBC 20.7 (*)    RBC 4.15 (*)    Hemoglobin 11.1 (*)    HCT 36.6 (*)    RDW 19.8 (*)    Platelets 635 (*)    Neutro Abs 19.3 (*)    Lymphs Abs 0.3 (*)    Abs Immature Granulocytes 0.25 (*)    All other components within normal limits  URINALYSIS, W/ REFLEX TO CULTURE (INFECTION SUSPECTED) - Abnormal; Notable for the following components:   APPearance TURBID (*)    pH 9.0 (*)     Bilirubin Urine SMALL (*)    Protein, ur >=300 (*)    Leukocytes,Ua LARGE (*)    Bacteria, UA RARE (*)    Non Squamous Epithelial 0-5 (*)    All other components within normal limits  I-STAT CG4 LACTIC ACID, ED - Abnormal; Notable for the following components:   Lactic Acid, Venous 6.8 (*)    All other components within normal limits  I-STAT CG4 LACTIC ACID, ED - Abnormal; Notable for the following components:   Lactic Acid, Venous 5.9 (*)    All other components within normal limits  I-STAT VENOUS BLOOD GAS, ED - Abnormal; Notable for the following components:   pH, Ven 7.458 (*)    pCO2, Ven 32.8 (*)    HCT 38.0 (*)    Hemoglobin 12.9 (*)    All other components within normal limits  CBG MONITORING, ED - Abnormal; Notable for the following components:   Glucose-Capillary 403 (*)    All other components within normal limits  CBG MONITORING, ED - Abnormal; Notable for the following components:   Glucose-Capillary 346 (*)    All other components within normal limits  TROPONIN T, HIGH SENSITIVITY - Abnormal; Notable for the following components:   Troponin T High Sensitivity 104 (*)    All other components within normal limits  TROPONIN T, HIGH SENSITIVITY - Abnormal; Notable for the following components:   Troponin T High Sensitivity 106 (*)    All other components within normal limits  RESP PANEL BY RT-PCR (RSV, FLU A&B, COVID)  RVPGX2  CULTURE, BLOOD (ROUTINE X 2)  CULTURE, BLOOD (ROUTINE X 2)  URINE CULTURE  CULTURE, RESPIRATORY W GRAM STAIN  MRSA NEXT GEN BY PCR, NASAL  PROTIME-INR  BETA-HYDROXYBUTYRIC ACID  CORTISOL  HEMOGLOBIN A1C  TSH  I-STAT CG4 LACTIC ACID, ED  I-STAT CG4 LACTIC ACID,  ED    EKG: None  Radiology: [facility] Chest Port 1 View Result Date: 02/02/2025 CLINICAL DATA:  Tachycardia. EXAM: PORTABLE CHEST 1 VIEW COMPARISON:  12/30/2024 FINDINGS: Opacity atelectasis or infiltrate is noted in the right lower lung and left base although basilar aeration is  generally improved compared to the prior study. No overt pulmonary edema. The cardiopericardial silhouette is within normal limits for size. Tracheostomy tube again noted. Telemetry leads overlie the chest. IMPRESSION: Interval improvement in bibasilar aeration with persistent atelectasis or infiltrate in the right lower lung and left base. Electronically Signed   By: Camellia Candle M.D.   On: 02/02/2025 05:45     .Critical Care  Performed by: Lorette Mayo, MD Authorized by: Lorette Mayo, MD   Critical care provider statement:    Critical care time (minutes):  80   Critical care was necessary to treat or prevent imminent or life-threatening deterioration of the following conditions:  Dehydration, shock, sepsis, respiratory failure, circulatory failure and CNS failure or compromise (End-of-life discussions with wife)   Critical care was time spent personally by me on the following activities:  Development of treatment plan with patient or surrogate, discussions with consultants, evaluation of patient's response to treatment, examination of patient, ordering and review of laboratory studies, ordering and review of radiographic studies, ordering and performing treatments and interventions, pulse oximetry, re-evaluation of patient's condition and review of old charts [address]  Performed by: Lorette Mayo, MD Authorized by: Lorette Mayo, MD   Consent:    Consent obtained:  Emergent situation   Risks, benefits, and alternatives were discussed: yes   Universal protocol:    Patient identity confirmed:  Arm band Pre-procedure details:    Indication(s): central venous access     Hand hygiene: Hand hygiene performed prior to insertion     Sterile barrier technique: All elements of maximal sterile technique followed     Skin preparation:  Chlorhexidine    Skin preparation agent: Skin preparation agent completely dried prior to procedure   Sedation:    Sedation type:  Anxiolysis Anesthesia:     Anesthesia method:  None Procedure details:    Location:  L femoral   Site selection rationale:  Trach collar, could not visualize venous structure on right.   Patient position:  Supine   Procedural supplies:  Triple lumen   Catheter size:  7 Fr   Ultrasound guidance: yes     Ultrasound guidance timing: real time     Sterile ultrasound techniques: Sterile gel and sterile probe covers were used     Number of attempts:  1   Successful placement: yes   Post-procedure details:    Post-procedure:  Dressing applied and line sutured   Assessment:  Blood return through all ports and free fluid flow   Procedure completion:  Tolerated well, no immediate complications   Complications:  N/a    Medications Ordered in the ED  LORazepam  (ATIVAN ) injection 1 mg (has no administration in time range)  midodrine  (PROAMATINE ) tablet 10 mg (10 mg Per Tube Given 02/02/25 0449)  norepinephrine  (LEVOPHED ) 4mg  in (0.016 mg/mL) premix infusion (15 mcg/min Intravenous Rate/Dose Change 02/02/25 0531)  vasopressin  (PITRESSIN) 20 Units in 100 mL (0.2 unit/mL) infusion-*FOR SHOCK* (0.03 Units/min Intravenous New Bag/Given 02/02/25 0527)  levETIRAcetam  (KEPPRA ) tablet 500 mg (has no administration in time range)  enoxaparin  (LOVENOX ) injection 40 mg (has no administration in time range)  levothyroxine  (SYNTHROID ) tablet 100 mcg (has no administration in time range)  liothyronine  (CYTOMEL ) tablet  7.5 mcg (has no administration in time range)  insulin  regular, human (MYXREDLIN ) 100 units/ 100 mL infusion (13 Units/hr Intravenous New Bag/Given 02/02/25 0706)  lactated ringers  infusion (has no administration in time range)  dextrose  5 % in lactated ringers  infusion (has no administration in time range)  dextrose  50 % solution 0-50 mL (has no administration in time range)  Chlorhexidine  Gluconate Cloth 2 % PADS 6 each (has no administration in time range)  senna (SENOKOT) tablet 8.6 mg (has no administration in time  range)  polyethylene glycol (MIRALAX  / GLYCOLAX ) packet 17 g (has no administration in time range)  fentaNYL  (SUBLIMAZE ) injection 50 mcg (has no administration in time range)  fentaNYL  (SUBLIMAZE ) injection 50-200 mcg (has no administration in time range)  vancomycin  (VANCOREADY) IVPB 1250 mg/250 mL (has no administration in time range)  meropenem  (MERREM ) 1 g in sodium chloride  0.9 % 100 mL IVPB (has no administration in time range)  sodium bicarbonate  injection 100 mEq (has no administration in time range)  polyethylene glycol (MIRALAX  / GLYCOLAX ) packet 17 g (has no administration in time range)  senna (SENOKOT) tablet 8.6 mg (has no administration in time range)  famotidine  (PEPCID ) tablet 20 mg (has no administration in time range)  hydrocortisone  sodium succinate  (SOLU-CORTEF ) 100 MG injection 100 mg (has no administration in time range)  lactated ringers  bolus 1,000 mL (0 mLs Intravenous Stopped 02/02/25 0520)    And  lactated ringers  bolus 1,000 mL (0 mLs Intravenous Stopped 02/02/25 0542)    And  lactated ringers  bolus 1,000 mL (0 mLs Intravenous Stopped 02/02/25 0606)  vancomycin  (VANCOREADY) IVPB 1500 mg/300 mL (1,500 mg Intravenous New Bag/Given 02/02/25 0517)  meropenem  (MERREM ) 1 g in sodium chloride  0.9 % 100 mL IVPB (0 g Intravenous Stopped 02/02/25 0603)  levETIRAcetam  (KEPPRA ) undiluted injection 1,000 mg (1,000 mg Intravenous Given 02/02/25 0456)                                    Medical Decision Making Amount and/or Complexity of Data Reviewed Labs: ordered. Radiology: ordered.  Risk Prescription drug management. Decision regarding hospitalization.   Patient is critically ill. Appears to be septic shock which may have been exacerbated by the metoprolol  given an hour prior to calling EMS.  Discussed with his wife and patient is no compressions but does want as much care as possible up until that point.  Sounds like his baseline mental status is he is able to speak a little  bit and is aware of things.  He ride wondered if possibly was having a seizure with his roving eye movements, tachycardia would not explain the hypotension but his lactic acid was pretty elevated as well.  Broad-spectrum antibiotics given after discussion with pharmacy.  Fluid boluses were given but even after over a liter he had no change in his blood pressure so started on Levophed  and vasopressin .  Those titrated up pretty quickly to get maps above 65 and a central line was placed as he was on multiple pressors at decent doses.  Patient often has DKA but does not appear to be in DKA today.  Patient second lactic acid was improved over his first.  His first troponin was over 100 but suspect this was probably demand related to his hypotension.  Initial rectal temperature taken here after couple hours so not necessarily accurate but was 102.6 prior to arrival here.  Discussed with ICU for admission.  Final diagnoses:  Shock (HCC)  Elevated lactic acid level  AKI (acute kidney injury)    ED Discharge Orders     None          Elliona Doddridge, Selinda, MD 02/02/25 [phone]  "

## 2025-02-02 NOTE — ED Notes (Signed)
Called RT for transport.

## 2025-02-02 NOTE — Progress Notes (Signed)
 RT assisted with transporting pt on vent from ED17 to 2M6 w/o any complications.

## 2025-02-02 NOTE — H&P (Signed)
 "  NAME:  Joshua Haley, MRN:  [id number], DOB:  [birth year], LOS: 0 ADMISSION DATE:  02/02/2025, CONSULTATION DATE:  2/6 REFERRING MD:  Dr. Lorette, CHIEF COMPLAINT:  Shock   History of Present Illness:  74 year old male with past medical history as below, which is significant for traumatic brain injury, aphasia, trach dependent, hypothyroidism, decubitus ulcers, diabetes, chronic hypotension, and multiple admissions for multidrug-resistant infections including pneumonia, urinary tract, and infected ulcers.  He was admitted to [facility] health from 12/8 to 12/12 due to pneumonia and UTI.  Urine cultures during that admission were positive for multidrug-resistant Klebsiella and E. coli.  He was again admitted from 12/28 to 1/6 for septic shock secondary to Proteus bacteremia, pneumonia, and diabetic ketoacidosis.  He was initiated on the ventilator via trach at the beginning of the hospitalization was ultimately able to be weaned off to trach collar.  He was discharged to [facility] 1/4 and was eventually discharged to [facility] 2/5.  Upon arrival to [facility] he was toxic appearing and hypotensive.  [facility] called EMS shortly after arrival and he was transported to [facility] emergency department.  Immediately upon arrival to the emergency department he was noted to be hypotensive.  I cannot gather if he came to the ED on a ventilator or if he was started on the ventilator after arrival.  Following 3 L of crystalloid resuscitation he was started on norepinephrine  infusion.  As infusion rate from norepinephrine  increased he was started on vasopressin .  Central and was placed in the emergency department under sterile procedure.  He was treated with empiric antibiotics for presumed sepsis.  PCCM is asked to admit.  Pertinent  Medical History   has a past medical history of Acute on chronic urinary retention, CAD (coronary artery disease), Diabetes mellitus without complication (HCC), Hypertension, Hypothyroidism, Kidney stone,  TBI (traumatic brain injury) (HCC), and Tracheostomy in place [facility]).   Significant Hospital Events: Including procedures, antibiotic start and stop dates in addition to other pertinent events   2/6 admit  Interim History / Subjective:    Objective    Blood pressure 90/67, pulse (!) 102, resp. rate (!) 21, height 6' (1.829 m), SpO2 100%.    Vent Mode: PRVC FiO2 (%):  [50 %] 50 % Set Rate:  [15 bmp] 15 bmp Vt Set:  [620 mL] 620 mL PEEP:  [5 cmH20] 5 cmH20 Plateau Pressure:  [26 cmH20] 26 cmH20   Intake/Output Summary (Last 24 hours) at 02/02/2025 9379 Last data filed at 02/02/2025 0606 Gross per 24 hour  Intake 3096.73 ml  Output --  Net 3096.73 ml   There were no vitals filed for this visit.  Examination: General: Frail elderly gentleman on vent via trach HENT: Concave right temple, atraumatic, PERRL Lungs: Coarse bilaterally Cardiovascular: Regular rate and rhythm Abdomen: GJ tube in place Extremities: Bilateral foot drop, trace lower extremity edema Neuro: Not responding or reacting to voice, pain GU: Foley in place appears old, draining dark urine  Resolved problem list   Assessment and Plan   Septic shock: Source not entirely clear, however, multiple recent admissions for multidrug-resistant organisms including Proteus, E. coli, Pseudomonas, Klebsiella.  Previously infected sites include pneumonia, urinary tract, and bacteremia.  There is also been speculation for soft tissue infection secondary to decubitus ulcers but no cultures had been identified.  Status post 3 L LR bolus. - Empiric meropenem  and vancomycin  - Culture blood, sputum, Urine - Exchange Foley catheter, will need coud team - Norepinephrine  infusion to for MAP  goal 65 - Shock dose vasopressin  - Consider stress dose steroids, check cortisol - Continue midodrine   Acute respiratory failure with hypoxia - Full vent support - Was able to previously be weaned off ventilator as of January - ABG  reviewed, reduce set rate - As needed fentanyl  for RASS goal 0 to -1  Lactic acidosis - improving - trend  AKI - support hemodynamically - trend chemistry  DM - recent admit for DKA. CBGs in the 400s upon admission.  - Check BHB - Start insulin  infusion per endotool protocol  Pressure ulcers POA - Wound care consult  Elevated troponin - trend  History of TBI - Keppra  500 BID continue  Hypothyroid - continue home levothyroxine  and liothyronine  - check TSH  Labs   CBC: Recent Labs  Lab 02/02/25 0430 02/02/25 0446  WBC 20.7*  --   NEUTROABS 19.3*  --   HGB 11.1* 12.9*  HCT 36.6* 38.0*  MCV 88.2  --   PLT 635*  --     Basic Metabolic Panel: Recent Labs  Lab 02/02/25 0430 02/02/25 0446  NA 132* 136  K 4.6 4.5  CL 93*  --   CO2 20*  --   GLUCOSE 429*  --   BUN 79*  --   CREATININE 1.73*  --   CALCIUM 9.8  --    GFR: CrCl cannot be calculated (Unknown ideal weight.). Recent Labs  Lab 02/02/25 0430 02/02/25 0444  WBC 20.7*  --   LATICACIDVEN  --  6.8*    Liver Function Tests: Recent Labs  Lab 02/02/25 0430  AST 23  ALT 16  ALKPHOS 257*  BILITOT 0.6  PROT 8.9*  ALBUMIN 2.9*   No results for input(s): LIPASE, AMYLASE in the last 168 hours. No results for input(s): AMMONIA in the last 168 hours.  ABG    Component Value Date/Time   PHART 7.250 (L) 12/24/2024 2116   PCO2ART 44.6 12/24/2024 2116   PO2ART 186 (H) 12/24/2024 2116   HCO3 23.2 02/02/2025 0446   TCO2 24 02/02/2025 0446   ACIDBASEDEF 7.0 (H) 12/24/2024 2116   O2SAT 77 02/02/2025 0446     Coagulation Profile: Recent Labs  Lab 02/02/25 0430  INR 1.1    Cardiac Enzymes: No results for input(s): CKTOTAL, CKMB, CKMBINDEX, TROPONINI in the last 168 hours.  HbA1C: Hgb A1c MFr Bld  Date/Time Value Ref Range Status  10/13/2024 02:58 PM 7.7 (H) 4.8 - 5.6 % Final    Comment:    (NOTE) Diagnosis of Diabetes The following HbA1c ranges recommended by the  American Diabetes Association (ADA) may be used as an aid in the diagnosis of diabetes mellitus.  Hemoglobin             Suggested A1C NGSP%              Diagnosis  <5.7                   Non Diabetic  5.7-6.4                Pre-Diabetic  >6.4                   Diabetic  <7.0                   Glycemic control for                       adults with diabetes.  12/29/2022 12:07 AM 9.2 (H) 4.8 - 5.6 % Final    Comment:    (NOTE)         Prediabetes: 5.7 - 6.4         Diabetes: >6.4         Glycemic control for adults with diabetes: <7.0     CBG: Recent Labs  Lab 02/02/25 0447  GLUCAP 403*    Review of Systems:   Patient is encephalopathic and/or intubated; therefore, history has been obtained from chart review.     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   Surgical History:   Past Surgical History:  Procedure Laterality Date   CHOLECYSTECTOMY     CRANIECTOMY     JEJUNOSTOMY FEEDING TUBE     LEFT HEART CATH AND CORONARY ANGIOGRAPHY N/A 01/22/2020   Procedure: LEFT HEART CATH AND CORONARY ANGIOGRAPHY and possible PCI and stent;  Surgeon: Florencio Cara BIRCH, MD;  Location: [facility] INVASIVE CV LAB;  Service: Cardiovascular;  Laterality: N/A;   tracheostomy       Social History:   reports that he has quit smoking. He has never used smokeless tobacco. He reports that he does not currently use alcohol . He reports that he does not currently use drugs.   Family History:  His family history is not on file.   Allergies Allergies[1]   Home Medications  Prior to Admission medications  Medication Sig Start Date End Date Taking? Authorizing Provider  acetaminophen  (TYLENOL ) 325 MG tablet Place 650 mg into feeding tube every 6 (six)  hours as needed for mild pain (pain score 1-3) or fever.    [provider]  amantadine  (SYMMETREL ) 50 MG/5ML solution Place 100 mg into feeding tube daily.    [provider]  artificial tears ophthalmic solution Place 2 drops into both eyes as needed for dry eyes. Patient taking differently: Place 2 drops into both eyes every 4 (four) hours as needed for dry eyes. 10/17/24   Sherrill Cable Latif, DO  ascorbic acid  (VITAMIN C ) 500 MG tablet Place 500 mg into feeding tube daily. Give 500mg  per tube daily    [provider]  Bempedoic Acid (NEXLETOL) 180 MG TABS Place 180 mg into feeding tube in the morning.    [provider]  cetirizine (ZYRTEC) 10 MG chewable tablet Place 10 mg into feeding tube daily.    [provider]  Dextrose , Diabetic Use, (DEX4 FAST ACTING GLUCOSE PO) Take 15 g by mouth as needed (for hypoglycemia).    [provider]  docusate sodium  (COLACE) 100 MG capsule Take 1 capsule (100 mg total) by mouth 2 (two) times daily as needed for mild constipation. 10/17/24   Sherrill Cable Latif, DO  enoxaparin  (LOVENOX ) 40 MG/0.4ML injection Inject 40 mg into the skin daily.    [provider]  ferrous sulfate  300 (60 Fe) MG/5ML syrup Place 300 mg into feeding tube 2 (two) times daily.    [provider]  furosemide  (LASIX ) 20 MG tablet Place 1 tablet (20 mg total) into feeding tube daily. 10/17/24   Sheikh, Cable Latif, DO  gatifloxacin  (ZYMAXID ) 0.5 % SOLN Place 1 drop into both eyes 4 (four) times daily. 10/17/24   Sheikh, Omair Latif, DO  Glucagon (GVOKE HYPOPEN 1-PACK) 1 MG/0.2ML SOAJ Inject 1 mg  into the skin as needed (for hypoglycemia).    [provider]  glucagon Emergency 1 MG SOLR Inject 1 mg into the muscle as needed (for hypoglycemia).    [provider]  insulin  aspart (FIASP  FLEXTOUCH) 100 UNIT/ML FlexTouch Pen Inject 0-15 Units into the skin every 6 (six) hours. Sliding scale insulin :   70-120= 0 units, 1 21-150= 2 units,  151-200= 3 units,  201-250= 5 units,  251-300= 8 units,  301-350= 11 units,  351-400= 15 units    [provider]  lansoprazole (PREVACID SOLUTAB) 30 MG disintegrating tablet Place 30 mg into feeding tube in the morning.    [provider]  levETIRAcetam  (KEPPRA ) 500 MG tablet Place 1 tablet (500 mg total) into feeding tube 2 (two) times daily. 01/27/23   Nguyen, Quan, DO  levothyroxine  (SYNTHROID ) 100 MCG tablet Place 100 mcg into feeding tube daily. Give 100mcg per tube daily    [provider]  liothyronine  (CYTOMEL ) 5 MCG tablet Place 7.5 mcg into feeding tube every 12 (twelve) hours. Give one and one half tablet (7.5mg ) per tube every 12 hours    [provider]  Methylcellulose, Laxative, (CITRUCEL) 500 MG TABS Place 1 tablet (500 mg total) into feeding tube 3 (three) times daily as needed (Constipation). 10/17/24   Sherrill Cable Latif, DO  midodrine  (PROAMATINE ) 10 MG tablet Place 1 tablet (10 mg total) into feeding tube 3 (three) times daily with meals. 10/17/24   Sherrill Cable Latif, DO  modafinil  (PROVIGIL ) 100 MG tablet GIVE 1 TAB VIA TUBE ONCE DAILY 11/03/24   Fleeta Finger, Selinda, MD  Nutritional Supplements (FEEDING SUPPLEMENT, GLUCERNA 1.5 CAL,) LIQD Place 60 mL/hr into feeding tube See admin instructions. Give 54mL/hr per tube by shift    [provider]  omega-3 acid ethyl esters (LOVAZA ) 1 g capsule Place 1 g into feeding tube daily. Give 1 g per tube daily    [provider]  ondansetron  (ZOFRAN -ODT) 4 MG disintegrating tablet Take 4 mg by mouth every 6 (six) hours as needed for nausea or vomiting (PER TUBE). Per tube    [provider]  polyethylene glycol (MIRALAX  / GLYCOLAX ) 17 g packet Give 17g per tube daily as needed for constipation    [provider]  sertraline  (ZOLOFT ) 25 MG tablet Place 25 mg into feeding tube daily. Give 25mg  per tube daily    [provider]  sodium chloride  irrigation 0.9 % irrigation Irrigate with 60 mLs as directed every 8 (eight) hours. Irrigate indwelling foley with 60mL sterile saline every 8 hours.    [provider]     Critical care time: 41 minutes              [1]  Allergies Allergen Reactions   Morphine And Codeine Other (See Comments)    Not specified on MAR    Penicillins Other (See Comments)   Cefepime  Other (See Comments)    Unknown reaction per [facility]   Statins Nausea And Vomiting   "

## 2025-02-02 NOTE — ED Triage Notes (Signed)
 Pt arrived at [facility] an hour ago, and they called EMS. EMS reports pt was tachy, pale, diaphoretic. No reaction to IO being placed. Pt was being treated at another hospital for bed sores before being transported to [facility].

## 2025-02-02 NOTE — ED Notes (Signed)
 Attempt at calling report to ICU, asked for callback. Transport delayed.

## 2025-02-02 NOTE — ED Notes (Signed)
 When you have time, Shashwat Cleary (wife) [phone] would like an update on pt. Status. Thank you
